# Patient Record
Sex: Male | Born: 1960
Health system: Southern US, Community
[De-identification: ages and names within clinical notes are randomized; demographics above are authoritative.]

## PROBLEM LIST (undated history)

## (undated) DIAGNOSIS — Z8739 Personal history of other diseases of the musculoskeletal system and connective tissue: Secondary | ICD-10-CM

## (undated) DIAGNOSIS — M542 Cervicalgia: Secondary | ICD-10-CM

## (undated) DIAGNOSIS — J302 Other seasonal allergic rhinitis: Secondary | ICD-10-CM

## (undated) DIAGNOSIS — F32A Depression, unspecified: Secondary | ICD-10-CM

## (undated) DIAGNOSIS — G4733 Obstructive sleep apnea (adult) (pediatric): Secondary | ICD-10-CM

## (undated) DIAGNOSIS — Z9989 Dependence on other enabling machines and devices: Secondary | ICD-10-CM

## (undated) DIAGNOSIS — K219 Gastro-esophageal reflux disease without esophagitis: Secondary | ICD-10-CM

## (undated) DIAGNOSIS — Z531 Procedure and treatment not carried out because of patient's decision for reasons of belief and group pressure: Secondary | ICD-10-CM

## (undated) DIAGNOSIS — M5412 Radiculopathy, cervical region: Secondary | ICD-10-CM

## (undated) DIAGNOSIS — F329 Major depressive disorder, single episode, unspecified: Secondary | ICD-10-CM

## (undated) DIAGNOSIS — I10 Essential (primary) hypertension: Secondary | ICD-10-CM

## (undated) DIAGNOSIS — M1990 Unspecified osteoarthritis, unspecified site: Secondary | ICD-10-CM

## (undated) DIAGNOSIS — G43909 Migraine, unspecified, not intractable, without status migrainosus: Secondary | ICD-10-CM

## (undated) DIAGNOSIS — E1169 Type 2 diabetes mellitus with other specified complication: Secondary | ICD-10-CM

## (undated) DIAGNOSIS — F419 Anxiety disorder, unspecified: Secondary | ICD-10-CM

## (undated) DIAGNOSIS — Z8719 Personal history of other diseases of the digestive system: Secondary | ICD-10-CM

## (undated) DIAGNOSIS — T753XXA Motion sickness, initial encounter: Secondary | ICD-10-CM

## (undated) DIAGNOSIS — C801 Malignant (primary) neoplasm, unspecified: Secondary | ICD-10-CM

## (undated) DIAGNOSIS — B019 Varicella without complication: Secondary | ICD-10-CM

## (undated) DIAGNOSIS — K469 Unspecified abdominal hernia without obstruction or gangrene: Secondary | ICD-10-CM

## (undated) DIAGNOSIS — J189 Pneumonia, unspecified organism: Secondary | ICD-10-CM

## (undated) DIAGNOSIS — R011 Cardiac murmur, unspecified: Secondary | ICD-10-CM

## (undated) DIAGNOSIS — IMO0001 Reserved for inherently not codable concepts without codable children: Secondary | ICD-10-CM

## (undated) DIAGNOSIS — E7800 Pure hypercholesterolemia, unspecified: Secondary | ICD-10-CM

## (undated) DIAGNOSIS — E119 Type 2 diabetes mellitus without complications: Secondary | ICD-10-CM

## (undated) DIAGNOSIS — E785 Hyperlipidemia, unspecified: Secondary | ICD-10-CM

## (undated) HISTORY — DX: Cardiac murmur, unspecified: R01.1

## (undated) HISTORY — DX: Gastro-esophageal reflux disease without esophagitis: K21.9

## (undated) HISTORY — DX: Varicella without complication: B01.9

## (undated) HISTORY — PX: VASECTOMY: SHX75

---
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
## 1982-10-18 HISTORY — PX: WRIST GANGLION EXCISION: SUR520

## 2013-10-25 ENCOUNTER — Encounter: Payer: Self-pay | Admitting: Internal Medicine

## 2013-10-25 ENCOUNTER — Ambulatory Visit (INDEPENDENT_AMBULATORY_CARE_PROVIDER_SITE_OTHER): Payer: 59 | Admitting: Internal Medicine

## 2013-10-25 VITALS — BP 132/84 | HR 77 | Temp 98.5°F | Ht 70.5 in | Wt 245.5 lb

## 2013-10-25 DIAGNOSIS — D1739 Benign lipomatous neoplasm of skin and subcutaneous tissue of other sites: Secondary | ICD-10-CM

## 2013-10-25 DIAGNOSIS — J309 Allergic rhinitis, unspecified: Secondary | ICD-10-CM

## 2013-10-25 DIAGNOSIS — Z125 Encounter for screening for malignant neoplasm of prostate: Secondary | ICD-10-CM

## 2013-10-25 DIAGNOSIS — Z0000 Encounter for general adult medical examination without abnormal findings: Secondary | ICD-10-CM

## 2013-10-25 DIAGNOSIS — D1723 Benign lipomatous neoplasm of skin and subcutaneous tissue of right leg: Secondary | ICD-10-CM

## 2013-10-25 DIAGNOSIS — M940 Chondrocostal junction syndrome [Tietze]: Secondary | ICD-10-CM

## 2013-10-25 NOTE — Progress Notes (Signed)
HPI Pt presents to the clinic today to establish care. He has not had a PCP in over a year. 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 He has multiple concerns today.  1- He has had a runny nose and cough. This started 3 weeks ago. The drainage is clear. He has not had fevers. He has tried saline nasal spray. He has no history of allergies or asthma.  2- He has a sharp pain in his right upper chest. This started about 4 days ago. The pain comes and goes but seems worse with movement. He denies history of chest trauma, chest tightness or shortness of breath. He has been moving heavy furniture by himself. He has not taken anything for the pain.  3- He has a lump on his right ankle. He just noticed this today. It does not hurt. He has not put anything on it.  Flu: 07/2013 Tetanus: UTD PSA screening: 10 years ago (? History of BPH) Colonoscopy: never Eye Doctor: Yearly Dentist: yearly  Past Medical History  Diagnosis Date  . GERD (gastroesophageal reflux disease)   . Heart murmur   . Chicken pox     Current Outpatient Prescriptions  Medication Sig Dispense Refill  . Amino Acid POWD 2 packets by Does not apply route daily.      Marland Kitchen ibuprofen (ADVIL,MOTRIN) 600 MG tablet Take 600 mg by mouth every 6 (six) hours as needed.      . methylcellulose (FIBER THERAPY) oral powder Take 1 packet by mouth 2 (two) times daily.      . Misc Natural Products (OSTEO BI-FLEX TRIPLE STRENGTH PO) Take 2 tablets by mouth daily.      . Multiple Vitamin (MULTIVITAMIN) capsule Take 1 capsule by mouth daily.      . Omega-3 Fatty Acids (FISH OIL) 1000 MG CAPS Take 1,000 mg by mouth 2 (two) times daily.       No current facility-administered medications for this visit.    No Known Allergies  Family History  Problem Relation Age of Onset  . Diabetes Mother   . Hyperlipidemia Mother   . Cancer Father   . Cancer Sister   . Diabetes Maternal Aunt   . Arthritis Maternal Grandmother   . Diabetes  Maternal Grandmother   . Arthritis Maternal Grandfather   . Arthritis Paternal Grandmother     History   Social History  . Marital Status: Married    Spouse Name: N/A    Number of Children: N/A  . Years of Education: N/A   Occupational History  . Not on file.   Social History Main Topics  . Smoking status: Former Research scientist (life sciences)  . Smokeless tobacco: Former Systems developer    Types: Chew  . Alcohol Use: Yes     Comment: moderate  . Drug Use: No  . Sexual Activity: Yes   Other Topics Concern  . Not on file   Social History Narrative  . No narrative on file    ROS:  Constitutional: Denies fever, malaise, fatigue, headache or abrupt weight changes.  HEENT: Denies eye pain, eye redness, ear pain, ringing in the ears, wax buildup,  nasal congestion, bloody nose, or sore throat. Respiratory: Denies difficulty breathing, shortness of breath, cough or sputum production.   Cardiovascular: Denies chest tightness, palpitations or swelling in the hands or feet.  Gastrointestinal: Denies abdominal pain, bloating, constipation, diarrhea or blood in the stool.  GU: Denies frequency, urgency, pain with urination, blood in urine, odor or discharge. Musculoskeletal:  Denies decrease in range of motion, difficulty with gait, muscle pain or joint pain and swelling.  Skin: Denies redness, rashes, lesions or ulcercations.  Neurological: Denies dizziness, difficulty with memory, difficulty with speech or problems with balance and coordination.   No other specific complaints in a complete review of systems (except as listed in HPI above).  PE:  BP 132/84  Pulse 77  Temp(Src) 98.5 F (36.9 C) (Oral)  Ht 5' 10.5" (1.791 m)  Wt 245 lb 8 oz (111.358 kg)  BMI 34.72 kg/m2  SpO2 98% Wt Readings from Last 3 Encounters:  10/25/13 245 lb 8 oz (111.358 kg)    General: Appears his stated age, obese but well developed, well nourished in NAD. HEENT: Head: normal shape and size; Eyes: sclera white, no icterus,  conjunctiva pink, PERRLA and EOMs intact; Ears: Tm's gray and intact, normal light reflex; Nose: mucosa pink and moist, septum midline; Throat/Mouth: Teeth present, mucosa pink and moist,+ PND, no lesions or ulcerations noted.  Neck: Normal range of motion. Neck supple, trachea midline. No massses, lumps or thyromegaly present.  Cardiovascular: Normal rate and rhythm. S1,S2 noted.  No murmur, rubs or gallops noted. No JVD or BLE edema. No carotid bruits noted. Pulmonary/Chest: Normal effort and positive vesicular breath sounds. No respiratory distress. No wheezes, rales or ronchi noted. Left costochondral junction between 2nd and 3rd rib with pinpoint tenderness.  Abdomen: Soft and nontender. Normal bowel sounds, no bruits noted. No distention or masses noted. Liver, spleen and kidneys non palpable. Musculoskeletal: Normal range of motion. No signs of joint swelling. No difficulty with gait. Quarter size lipoma noted on the right ankle just above the lateral malleolus.  Neurological: Alert and oriented. Cranial nerves II-XII intact. Coordination normal. +DTRs bilaterally. Psychiatric: Mood and affect normal. Behavior is normal. Judgment and thought content normal.      Assessment and Plan:  Preventative Health Maintenance:  Advised pt to work on diet and exercise Will order colon screening Will obtain screening labs today including PSA  Costochondritis, likely due to heavy lifting:  Advised pt to avoid heavy lifting Take advil OTC  Allergic Rhinitis:  Advised to take an OTC antihistamine like zyrtec  Lipoma, right ankle:  No intervention necessary Will continue to monitor  RTC in 1 year or sooner if needed

## 2013-10-25 NOTE — Patient Instructions (Signed)
Health Maintenance, Males A healthy lifestyle and preventative care can promote health and wellness.  Maintain regular health, dental, and eye exams.  Eat a healthy diet. Foods like vegetables, fruits, whole grains, low-fat dairy products, and lean protein foods contain the nutrients you need without too many calories. Decrease your intake of foods high in solid fats, added sugars, and salt. Get information about a proper diet from your caregiver, if necessary.  Regular physical exercise is one of the most important things you can do for your health. Most adults should get at least 150 minutes of moderate-intensity exercise (any activity that increases your heart rate and causes you to sweat) each week. In addition, most adults need muscle-strengthening exercises on 2 or more days a week.   Maintain a healthy weight. The body mass index (BMI) is a screening tool to identify possible weight problems. It provides an estimate of body fat based on height and weight. Your caregiver can help determine your BMI, and can help you achieve or maintain a healthy weight. For adults 20 years and older:  A BMI below 18.5 is considered underweight.  A BMI of 18.5 to 24.9 is normal.  A BMI of 25 to 29.9 is considered overweight.  A BMI of 30 and above is considered obese.  Maintain normal blood lipids and cholesterol by exercising and minimizing your intake of saturated fat. Eat a balanced diet with plenty of fruits and vegetables. Blood tests for lipids and cholesterol should begin at age 84 and be repeated every 5 years. If your lipid or cholesterol levels are high, you are over 50, or you are a high risk for heart disease, you may need your cholesterol levels checked more frequently.Ongoing high lipid and cholesterol levels should be treated with medicines, if diet and exercise are not effective.  If you smoke, find out from your caregiver how to quit. If you do not use tobacco, do not start.  Lung  cancer screening is recommended for adults aged 62 80 years who are at high risk for developing lung cancer because of a history of smoking. Yearly low-dose computed tomography (CT) is recommended for people who have at least a 30-pack-year history of smoking and are a current smoker or have quit within the past 15 years. A pack year of smoking is smoking an average of 1 pack of cigarettes a day for 1 year (for example: 1 pack a day for 30 years or 2 packs a day for 15 years). Yearly screening should continue until the smoker has stopped smoking for at least 15 years. Yearly screening should also be stopped for people who develop a health problem that would prevent them from having lung cancer treatment.  If you choose to drink alcohol, do not exceed 2 drinks per day. One drink is considered to be 12 ounces (355 mL) of beer, 5 ounces (148 mL) of wine, or 1.5 ounces (44 mL) of liquor.  Avoid use of street drugs. Do not share needles with anyone. Ask for help if you need support or instructions about stopping the use of drugs.  High blood pressure causes heart disease and increases the risk of stroke. Blood pressure should be checked at least every 1 to 2 years. Ongoing high blood pressure should be treated with medicines if weight loss and exercise are not effective.  If you are 32 to 51 years old, ask your caregiver if you should take aspirin to prevent heart disease.  Diabetes screening involves taking a blood  sample to check your fasting blood sugar level. This should be done once every 3 years, after age 80, if you are within normal weight and without risk factors for diabetes. Testing should be considered at a younger age or be carried out more frequently if you are overweight and have at least 1 risk factor for diabetes.  Colorectal cancer can be detected and often prevented. Most routine colorectal cancer screening begins at the age of 72 and continues through age 62. However, your caregiver may  recommend screening at an earlier age if you have risk factors for colon cancer. On a yearly basis, your caregiver may provide home test kits to check for hidden blood in the stool. Use of a small camera at the end of a tube, to directly examine the colon (sigmoidoscopy or colonoscopy), can detect the earliest forms of colorectal cancer. Talk to your caregiver about this at age 14, when routine screening begins. Direct examination of the colon should be repeated every 5 to 10 years through age 100, unless early forms of pre-cancerous polyps or small growths are found.  Hepatitis C blood testing is recommended for all people born from [birth year] through 1963 and any individual with known risks for hepatitis C.  Healthy men should no longer receive prostate-specific antigen (PSA) blood tests as part of routine cancer screening. Consult with your caregiver about prostate cancer screening.  Testicular cancer screening is not recommended for adolescents or adult males who have no symptoms. Screening includes self-exam, caregiver exam, and other screening tests. Consult with your caregiver about any symptoms you have or any concerns you have about testicular cancer.  Practice safe sex. Use condoms and avoid high-risk sexual practices to reduce the spread of sexually transmitted infections (STIs).  Use sunscreen. Apply sunscreen liberally and repeatedly throughout the day. You should seek shade when your shadow is shorter than you. Protect yourself by wearing long sleeves, pants, a wide-brimmed hat, and sunglasses year round, whenever you are outdoors.  Notify your caregiver of new moles or changes in moles, especially if there is a change in shape or color. Also notify your caregiver if a mole is larger than the size of a pencil eraser.  A one-time screening for abdominal aortic aneurysm (AAA) and surgical repair of large AAAs by sound wave imaging (ultrasonography) is recommended for ages 8 to 68 years who are  current or former smokers.  Stay current with your immunizations. Document Released: 04/01/2008 Document Revised: 01/29/2013 Document Reviewed: 03/01/2011 [facility] Patient Information 2014 [place], [place].

## 2013-10-26 LAB — COMPREHENSIVE METABOLIC PANEL
ALT: 23 U/L (ref 0–53)
AST: 31 U/L (ref 0–37)
Albumin: 4.4 g/dL (ref 3.5–5.2)
Alkaline Phosphatase: 57 U/L (ref 39–117)
BILIRUBIN TOTAL: 0.7 mg/dL (ref 0.3–1.2)
BUN: 19 mg/dL (ref 6–23)
CHLORIDE: 105 meq/L (ref 96–112)
CO2: 28 mEq/L (ref 19–32)
Calcium: 9.5 mg/dL (ref 8.4–10.5)
Creatinine, Ser: 1.3 mg/dL (ref 0.4–1.5)
GFR: 77.08 mL/min (ref >60.00)
Glucose, Bld: 84 mg/dL (ref 70–99)
Potassium: 4.4 mEq/L (ref 3.5–5.1)
SODIUM: 139 meq/L (ref 135–145)
Total Protein: 7.5 g/dL (ref 6.0–8.3)

## 2013-10-26 LAB — HEMOGLOBIN A1C: HEMOGLOBIN A1C: 7.1 % — AB (ref 4.6–6.5)

## 2013-10-26 LAB — LIPID PANEL
Cholesterol: 204 mg/dL — ABNORMAL HIGH (ref 0–200)
HDL: 34.2 mg/dL — ABNORMAL LOW (ref >39.00)
Total CHOL/HDL Ratio: 6
Triglycerides: 125 mg/dL (ref 0.0–149.0)
VLDL: 25 mg/dL (ref 0.0–40.0)

## 2013-10-26 LAB — CBC
HEMATOCRIT: 45.9 % (ref 39.0–52.0)
Hemoglobin: 15.1 g/dL (ref 13.0–17.0)
MCHC: 33 g/dL (ref 30.0–36.0)
MCV: 78.3 fl (ref 78.0–100.0)
Platelets: 238 10*3/uL (ref 150.0–400.0)
RBC: 5.87 Mil/uL — ABNORMAL HIGH (ref 4.22–5.81)
RDW: 15.4 % — ABNORMAL HIGH (ref 11.5–14.6)
WBC: 6.4 10*3/uL (ref 4.5–10.5)

## 2013-10-26 LAB — LDL CHOLESTEROL, DIRECT: LDL DIRECT: 143.5 mg/dL

## 2013-10-26 LAB — TSH: TSH: 1.59 u[IU]/mL (ref 0.35–5.50)

## 2013-10-26 LAB — PSA: PSA: 0.52 ng/mL (ref 0.10–4.00)

## 2013-10-30 ENCOUNTER — Ambulatory Visit (INDEPENDENT_AMBULATORY_CARE_PROVIDER_SITE_OTHER): Payer: 59 | Admitting: Internal Medicine

## 2013-10-30 ENCOUNTER — Encounter: Payer: Self-pay | Admitting: Internal Medicine

## 2013-10-30 ENCOUNTER — Other Ambulatory Visit: Payer: Self-pay

## 2013-10-30 VITALS — BP 132/84 | HR 76 | Temp 98.4°F | Wt 246.5 lb

## 2013-10-30 DIAGNOSIS — E119 Type 2 diabetes mellitus without complications: Secondary | ICD-10-CM

## 2013-10-30 DIAGNOSIS — R071 Chest pain on breathing: Secondary | ICD-10-CM

## 2013-10-30 DIAGNOSIS — R0789 Other chest pain: Secondary | ICD-10-CM

## 2013-10-30 DIAGNOSIS — M940 Chondrocostal junction syndrome [Tietze]: Secondary | ICD-10-CM

## 2013-10-30 MED ORDER — METFORMIN HCL 500 MG PO TABS: 500.0000 mg | ORAL_TABLET | Freq: Two times a day (BID) | ORAL | Status: DC

## 2013-10-30 MED ORDER — TRAMADOL HCL 50 MG PO TABS: 50.0000 mg | ORAL_TABLET | Freq: Two times a day (BID) | ORAL | Status: DC | PRN

## 2013-10-30 NOTE — Progress Notes (Signed)
Pre-visit discussion using our clinic review tool. No additional management support is needed unless otherwise documented below in the visit note.  

## 2013-10-30 NOTE — Progress Notes (Signed)
Subjective:    Patient ID: Marc Aguilar, male    DOB: 02/07/1961, 53 y.o.   MRN: [id number]  HPI  Pt presents to the clinic today to f/u labs. His labs revealed that his A1C was 7.1%. He has no previous history of diabetes. He does have a family history of diabetes. He has been testing his sugars, which have ranged from 97-190.  Additionally, he c/o worsening MSK pain. He injured his right chest wall while attempting to move furniture by himself. He does report the pain is now a 7/8. The Advil is not very effective.   Review of Systems  Past Medical History  Diagnosis Date  . GERD (gastroesophageal reflux disease)   . Heart murmur   . Chicken pox     Current Outpatient Prescriptions  Medication Sig Dispense Refill  . Amino Acid POWD 2 packets by Does not apply route daily.      Marland Kitchen ibuprofen (ADVIL,MOTRIN) 600 MG tablet Take 600 mg by mouth every 6 (six) hours as needed.      . methylcellulose (FIBER THERAPY) oral powder Take 1 packet by mouth 2 (two) times daily.      . Misc Natural Products (OSTEO BI-FLEX TRIPLE STRENGTH PO) Take 2 tablets by mouth daily.      . Multiple Vitamin (MULTIVITAMIN) capsule Take 1 capsule by mouth daily.      . Omega-3 Fatty Acids (FISH OIL) 1000 MG CAPS Take 1,000 mg by mouth 2 (two) times daily.       No current facility-administered medications for this visit.    No Known Allergies  Family History  Problem Relation Age of Onset  . Diabetes Mother   . Hyperlipidemia Mother   . Cancer Father   . Cancer Sister   . Diabetes Maternal Aunt   . Arthritis Maternal Grandmother   . Diabetes Maternal Grandmother   . Arthritis Maternal Grandfather   . Arthritis Paternal Grandmother     History   Social History  . Marital Status: Married    Spouse Name: N/A    Number of Children: N/A  . Years of Education: N/A   Occupational History  . Not on file.   Social History Main Topics  . Smoking status: Former Research scientist (life sciences)  . Smokeless tobacco: Former  Systems developer    Types: Chew  . Alcohol Use: Yes     Comment: moderate  . Drug Use: No  . Sexual Activity: Yes   Other Topics Concern  . Not on file   Social History Narrative  . No narrative on file     Constitutional: Denies fever, malaise, fatigue, headache or abrupt weight changes.  Respiratory: Denies difficulty breathing, shortness of breath, cough or sputum production.   Cardiovascular: Denies chest pain, chest tightness, palpitations or swelling in the hands or feet.  Gastrointestinal: Denies abdominal pain, bloating, constipation, diarrhea or blood in the stool.  GU: Denies urgency, frequency, pain with urination, burning sensation, blood in urine, odor or discharge. Musculoskeletal: Pt reports chest wall pain. Denies decrease in range of motion, difficulty with gait, or joint pain and swelling.  Skin: Denies redness, rashes, lesions or ulcercations.  Neurological: Denies numbness or tingling in the hands or feet, dizziness, difficulty with memory, difficulty with speech or problems with balance and coordination.   No other specific complaints in a complete review of systems (except as listed in HPI above).     Objective:   Physical Exam  BP 132/84  Pulse 76  Temp(Src) 98.4  F (36.9 C) (Oral)  Wt 246 lb 8 oz (111.812 kg)  SpO2 98% Wt Readings from Last 3 Encounters:  10/30/13 246 lb 8 oz (111.812 kg)  10/25/13 245 lb 8 oz (111.358 kg)    General: Appears his stated age, obese but  well developed, well nourished in NAD. Skin: Warm, dry and intact. No rashes, lesions or ulcerations noted. Cardiovascular: Normal rate and rhythm. S1,S2 noted.  No murmur, rubs or gallops noted. No JVD or BLE edema. No carotid bruits noted. Pulmonary/Chest: Normal effort and positive vesicular breath sounds. No respiratory distress. No wheezes, rales or ronchi noted.  Musculoskeletal: Right costochondral junction tenderness with palpation. Normal range of motion. No signs of joint swelling. No  difficulty with gait.  Neurological: Alert and oriented. Cranial nerves II-XII intact. Coordination normal. +DTRs bilaterally.     BMET    Component Value Date/Time   NA 139 10/25/2013 1602   K 4.4 10/25/2013 1602   CL 105 10/25/2013 1602   CO2 28 10/25/2013 1602   GLUCOSE 84 10/25/2013 1602   BUN 19 10/25/2013 1602   CREATININE 1.3 10/25/2013 1602   CALCIUM 9.5 10/25/2013 1602    Lipid Panel     Component Value Date/Time   CHOL 204* 10/25/2013 1602   TRIG 125.0 10/25/2013 1602   HDL 34.20* 10/25/2013 1602   CHOLHDL 6 10/25/2013 1602   VLDL 25.0 10/25/2013 1602    CBC    Component Value Date/Time   WBC 6.4 10/25/2013 1602   RBC 5.87* 10/25/2013 1602   HGB 15.1 10/25/2013 1602   HCT 45.9 10/25/2013 1602   PLT 238.0 10/25/2013 1602   MCV 78.3 10/25/2013 1602   MCHC 33.0 10/25/2013 1602   RDW 15.4* 10/25/2013 1602    Hgb A1C Lab Results  Component Value Date   HGBA1C 7.1* 10/25/2013         Assessment & Plan:   Chest wall pain secondary to costochondritis:  I want you to continue taking the Ibuprofen for its antiinflammatory properties eRx for Tramadol as needed Gave pt some stretching exercises to help stretch the area out

## 2013-10-30 NOTE — Patient Instructions (Signed)
Type 2 Diabetes Mellitus, Adult Type 2 diabetes mellitus, often simply referred to as type 2 diabetes, is a long-lasting (chronic) disease. In type 2 diabetes, the pancreas does not make enough insulin (a hormone), the cells are less responsive to the insulin that is made (insulin resistance), or both. Normally, insulin moves sugars from food into the tissue cells. The tissue cells use the sugars for energy. The lack of insulin or the lack of normal response to insulin causes excess sugars to build up in the blood instead of going into the tissue cells. As a result, high blood sugar (hyperglycemia) develops. The effect of high sugar (glucose) levels can cause many complications. Type 2 diabetes was also previously called adult-onset diabetes but it can occur at any age.  RISK FACTORS  A person is predisposed to developing type 2 diabetes if someone in the family has the disease and also has one or more of the following primary risk factors:  Overweight.  An inactive lifestyle.  A history of consistently eating high-calorie foods. Maintaining a normal weight and regular physical activity can reduce the chance of developing type 2 diabetes. SYMPTOMS  A person with type 2 diabetes may not show symptoms initially. The symptoms of type 2 diabetes appear slowly. The symptoms include:  Increased thirst (polydipsia).  Increased urination (polyuria).  Increased urination during the night (nocturia).  Weight loss. This weight loss may be rapid.  Frequent, recurring infections.  Tiredness (fatigue).  Weakness.  Vision changes, such as blurred vision.  Fruity smell to your breath.  Abdominal pain.  Nausea or vomiting.  Cuts or bruises which are slow to heal.  Tingling or numbness in the hands or feet. DIAGNOSIS Type 2 diabetes is frequently not diagnosed until complications of diabetes are present. Type 2 diabetes is diagnosed when symptoms or complications are present and when blood  glucose levels are increased. Your blood glucose level may be checked by one or more of the following blood tests:  A fasting blood glucose test. You will not be allowed to eat for at least 8 hours before a blood sample is taken.  A random blood glucose test. Your blood glucose is checked at any time of the day regardless of when you ate.  A hemoglobin A1c blood glucose test. A hemoglobin A1c test provides information about blood glucose control over the previous 3 months.  An oral glucose tolerance test (OGTT). Your blood glucose is measured after you have not eaten (fasted) for 2 hours and then after you drink a glucose-containing beverage. TREATMENT   You may need to take insulin or diabetes medicine daily to keep blood glucose levels in the desired range.  You will need to match insulin dosing with exercise and healthy food choices. The treatment goal is to maintain the before meal blood sugar (preprandial glucose) level at 70 130 mg/dL. HOME CARE INSTRUCTIONS   Have your hemoglobin A1c level checked twice a year.  Perform daily blood glucose monitoring as directed by your caregiver.  Monitor urine ketones when you are ill and as directed by your caregiver.  Take your diabetes medicine or insulin as directed by your caregiver to maintain your blood glucose levels in the desired range.  Never run out of diabetes medicine or insulin. It is needed every day.  Adjust insulin based on your intake of carbohydrates. Carbohydrates can raise blood glucose levels but need to be included in your diet. Carbohydrates provide vitamins, minerals, and fiber which are an essential part of   a healthy diet. Carbohydrates are found in fruits, vegetables, whole grains, dairy products, legumes, and foods containing added sugars.    Eat healthy foods. Alternate 3 meals with 3 snacks.  Lose weight if overweight.  Carry a medical alert card or wear your medical alert jewelry.  Carry a 15 gram  carbohydrate snack with you at all times to treat low blood glucose (hypoglycemia). Some examples of 15 gram carbohydrate snacks include:  Glucose tablets, 3 or 4   Glucose gel, 15 gram tube  Raisins, 2 tablespoons (24 grams)  Jelly beans, 6  Animal crackers, 8  Regular pop, 4 ounces (120 mL)  Gummy treats, 9  Recognize hypoglycemia. Hypoglycemia occurs with blood glucose levels of 70 mg/dL and below. The risk for hypoglycemia increases when fasting or skipping meals, during or after intense exercise, and during sleep. Hypoglycemia symptoms can include:  Tremors or shakes.  Decreased ability to concentrate.  Sweating.  Increased heart rate.  Headache.  Dry mouth.  Hunger.  Irritability.  Anxiety.  Restless sleep.  Altered speech or coordination.  Confusion.  Treat hypoglycemia promptly. If you are alert and able to safely swallow, follow the 15:15 rule:  Take 15 20 grams of rapid-acting glucose or carbohydrate. Rapid-acting options include glucose gel, glucose tablets, or 4 ounces (120 mL) of fruit juice, regular soda, or low fat milk.  Check your blood glucose level 15 minutes after taking the glucose.  Take 15 20 grams more of glucose if the repeat blood glucose level is still 70 mg/dL or below.  Eat a meal or snack within 1 hour once blood glucose levels return to normal.    Be alert to polyuria and polydipsia which are early signs of hyperglycemia. An early awareness of hyperglycemia allows for prompt treatment. Treat hyperglycemia as directed by your caregiver.  Engage in at least 150 minutes of moderate-intensity physical activity a week, spread over at least 3 days of the week or as directed by your caregiver. In addition, you should engage in resistance exercise at least 2 times a week or as directed by your caregiver.  Adjust your medicine and food intake as needed if you start a new exercise or sport.  Follow your sick day plan at any time you  are unable to eat or drink as usual.  Avoid tobacco use.  Limit alcohol intake to no more than 1 drink per day for nonpregnant women and 2 drinks per day for men. You should drink alcohol only when you are also eating food. Talk with your caregiver whether alcohol is safe for you. Tell your caregiver if you drink alcohol several times a week.  Follow up with your caregiver regularly.  Schedule an eye exam soon after the diagnosis of type 2 diabetes and then annually.  Perform daily skin and foot care. Examine your skin and feet daily for cuts, bruises, redness, nail problems, bleeding, blisters, or sores. A foot exam by a caregiver should be done annually.  Brush your teeth and gums at least twice a day and floss at least once a day. Follow up with your dentist regularly.  Share your diabetes management plan with your workplace or school.  Stay up-to-date with immunizations.  Learn to manage stress.  Obtain ongoing diabetes education and support as needed.  Participate in, or seek rehabilitation as needed to maintain or improve independence and quality of life. Request a physical or occupational therapy referral if you are having foot or hand numbness or difficulties with grooming,   dressing, eating, or physical activity. SEEK MEDICAL CARE IF:   You are unable to eat food or drink fluids for more than 6 hours.  You have nausea and vomiting for more than 6 hours.  Your blood glucose level is over 240 mg/dL.  There is a change in mental status.  You develop an additional serious illness.  You have diarrhea for more than 6 hours.  You have been sick or have had a fever for a couple of days and are not getting better.  You have pain during any physical activity.  SEEK IMMEDIATE MEDICAL CARE IF:  You have difficulty breathing.  You have moderate to large ketone levels. MAKE SURE YOU:  Understand these instructions.  Will watch your condition.  Will get help right away if  you are not doing well or get worse. Document Released: 10/04/2005 Document Revised: 06/28/2012 Document Reviewed: 05/02/2012 Exit[employer] Patient Information 2014 [employer].  

## 2013-10-30 NOTE — Assessment & Plan Note (Signed)
Counseled pt on diabetes process in the body, possible negative outcomes Advised pt the changes he could make with diet and exercise He will test his sugars BID We will start Metformin 500 mg BID  Foot exam today Referral placed for Diabetes Nutrition  He has already had his flu shot, will get his pneumovax at next visit  RTC in 3 months to recheck A1C

## 2013-10-31 ENCOUNTER — Telehealth: Payer: Self-pay

## 2013-10-31 NOTE — Telephone Encounter (Signed)
Relevant patient education assigned to patient using Emmi. ° °

## 2013-11-24 ENCOUNTER — Encounter: Payer: 59 | Attending: Internal Medicine

## 2013-11-24 VITALS — Ht 71.0 in | Wt 221.6 lb

## 2013-11-24 DIAGNOSIS — E119 Type 2 diabetes mellitus without complications: Secondary | ICD-10-CM | POA: Insufficient documentation

## 2013-11-24 NOTE — Progress Notes (Signed)
Patient was seen on 11/24/13 for the complete diabetes self-management series at the [facility].  Current A1c = 9.2% on 10/05/13  Handouts given during class include:  Living Well with Diabetes book  Carb Counting and Meal Planning book  Meal Plan Card  Carbohydrate guide  Meal planning worksheet  Low Sodium Flavoring Tips  The diabetes portion plate  Low Carbohydrate Snack Suggestions  A1c to eAG Conversion Chart  Diabetes Medications  Stress Management  Diabetes Recommended Care Schedule  Diabetes Success Plan  Core Class Satisfaction Survey  The following learning objectives were met by the patient during this course:  Describe diabetes  State some common risk factors for diabetes  Defines the role of glucose and insulin  Identifies type of diabetes and pathophysiology  Describe the relationship between diabetes and cardiovascular risk  State the members of the Healthcare Team  States the rationale for glucose monitoring  State when to test glucose  State their individual Target Range  State the importance of logging glucose readings  Describe how to interpret glucose readings  Identifies A1C target  Explain the correlation between A1c and eAG values  State symptoms and treatment of high blood glucose  State symptoms and treatment of low blood glucose  Explain proper technique for glucose testing  Identifies proper sharps disposal  Describe the role of different macronutrients on glucose  Explain how carbohydrates affect blood glucose  State what foods contain the most carbohydrates  Demonstrate carbohydrate counting  Demonstrate how to read Nutrition Facts food label  Describe effects of various fats on heart health  Describe the importance of good nutrition for health and healthy eating strategies  Describe techniques for managing your shopping, cooking and meal planning  List strategies to follow  meal plan when dining out  Describe the effects of alcohol on glucose and how to use it safely   State the amount of activity recommended for healthy living   Describe activities suitable for individual needs   Identify ways to regularly incorporate activity into daily life   Identify barriers to activity and ways to over come these barriers  Identify diabetes medications being personally used and their primary action for lowering glucose and possible side effects   Describe role of stress on blood glucose and develop strategies to address psychosocial issues   Identify diabetes complications and ways to prevent them  Explain how to manage diabetes during illness   Evaluate success in meeting personal goal   Establish 2-3 goals that they will plan to diligently work on until they return for the  4-month follow-up visit  Goals:  Follow Diabetes Meal Plan as instructed  Eat 3 meals and 2 snacks, every 3-5 hrs  Limit carbohydrate intake to 45 grams carbohydrate/meal Limit carbohydrate intake to 15 grams carbohydrate/snack Add lean protein foods to meals/snacks  Monitor glucose levels as instructed by your doctor  Aim for 15-30 mins of physical activity daily as tolerated  Bring food record and glucose log to your next nutrition visit  Your patient has established the following 4 month goals in their individualized success plan:  Count carbohydrates at most meals and snacks  Spread of physical activity throughout the week  Your patient has identified these potential barriers to change:  weather  Job stress  Your patient has identified their diabetes self-care support plan as  family 

## 2013-12-20 ENCOUNTER — Ambulatory Visit: Payer: 59 | Admitting: Internal Medicine

## 2013-12-20 DIAGNOSIS — Z0289 Encounter for other administrative examinations: Secondary | ICD-10-CM

## 2013-12-21 ENCOUNTER — Encounter: Payer: Self-pay | Admitting: Internal Medicine

## 2013-12-22 ENCOUNTER — Encounter (HOSPITAL_COMMUNITY): Payer: Self-pay | Admitting: Emergency Medicine

## 2013-12-22 ENCOUNTER — Emergency Department (HOSPITAL_COMMUNITY)
Admission: EM | Admit: 2013-12-22 | Discharge: 2013-12-22 | Disposition: A | Discharge status: Nursing Home (Includes ICF) | Payer: 59 | Source: Home / Self Care | Attending: Family Medicine | Admitting: Family Medicine

## 2013-12-22 ENCOUNTER — Emergency Department (HOSPITAL_COMMUNITY)
Admission: EM | Admit: 2013-12-22 | Discharge: 2013-12-22 | Disposition: A | Discharge status: Home / Self Care | Payer: 59 | Attending: Emergency Medicine | Admitting: Emergency Medicine

## 2013-12-22 DIAGNOSIS — E119 Type 2 diabetes mellitus without complications: Secondary | ICD-10-CM | POA: Insufficient documentation

## 2013-12-22 DIAGNOSIS — K219 Gastro-esophageal reflux disease without esophagitis: Secondary | ICD-10-CM | POA: Insufficient documentation

## 2013-12-22 DIAGNOSIS — I82409 Acute embolism and thrombosis of unspecified deep veins of unspecified lower extremity: Secondary | ICD-10-CM

## 2013-12-22 DIAGNOSIS — M79604 Pain in right leg: Secondary | ICD-10-CM

## 2013-12-22 DIAGNOSIS — B019 Varicella without complication: Secondary | ICD-10-CM | POA: Insufficient documentation

## 2013-12-22 DIAGNOSIS — Z87891 Personal history of nicotine dependence: Secondary | ICD-10-CM | POA: Insufficient documentation

## 2013-12-22 DIAGNOSIS — M79609 Pain in unspecified limb: Secondary | ICD-10-CM | POA: Insufficient documentation

## 2013-12-22 DIAGNOSIS — R609 Edema, unspecified: Secondary | ICD-10-CM | POA: Insufficient documentation

## 2013-12-22 MED ORDER — IBUPROFEN 400 MG PO TABS
600.0000 mg | ORAL_TABLET | Freq: Once | ORAL | Status: AC
  Administered 2013-12-22: 600 mg via ORAL
  Filled 2013-12-22 (×2): qty 1

## 2013-12-22 NOTE — Progress Notes (Signed)
VASCULAR LAB PRELIMINARY  PRELIMINARY  PRELIMINARY  PRELIMINARY  Right lower extremity venous Doppler completed.    Preliminary report:  There is no DVT or SVT noted in the right lower extremity.  There is an area noted form mid to proximal calf that could possibly be consistent with a muscle tear.  Lovena Kluck, RVT 12/22/2013, 3:16 PM

## 2013-12-22 NOTE — Discharge Instructions (Signed)
Musculoskeletal Pain °Musculoskeletal pain is muscle and boney aches and pains. These pains can occur in any part of the body. Your caregiver may treat you without knowing the cause of the pain. They may treat you if blood or urine tests, X-rays, and other tests were normal.  °CAUSES °There is often not a definite cause or reason for these pains. These pains may be caused by a type of germ (virus). The discomfort may also come from overuse. Overuse includes working out too hard when your body is not fit. Boney aches also come from weather changes. Bone is sensitive to atmospheric pressure changes. °HOME CARE INSTRUCTIONS  °· Ask when your test results will be ready. Make sure you get your test results. °· Only take over-the-counter or prescription medicines for pain, discomfort, or fever as directed by your caregiver. If you were given medications for your condition, do not drive, operate machinery or power tools, or sign legal documents for 24 hours. Do not drink alcohol. Do not take sleeping pills or other medications that may interfere with treatment. °· Continue all activities unless the activities cause more pain. When the pain lessens, slowly resume normal activities. Gradually increase the intensity and duration of the activities or exercise. °· During periods of severe pain, bed rest may be helpful. Lay or sit in any position that is comfortable. °· Putting ice on the injured area. °· Put ice in a bag. °· Place a towel between your skin and the bag. °· Leave the ice on for 15 to 20 minutes, 3 to 4 times a day. °· Follow up with your caregiver for continued problems and no reason can be found for the pain. If the pain becomes worse or does not go away, it may be necessary to repeat tests or do additional testing. Your caregiver may need to look further for a possible cause. °SEEK IMMEDIATE MEDICAL CARE IF: °· You have pain that is getting worse and is not relieved by medications. °· You develop chest pain  that is associated with shortness or breath, sweating, feeling sick to your stomach (nauseous), or throw up (vomit). °· Your pain becomes localized to the abdomen. °· You develop any new symptoms that seem different or that concern you. °MAKE SURE YOU:  °· Understand these instructions. °· Will watch your condition. °· Will get help right away if you are not doing well or get worse. °Document Released: 10/04/2005 Document Revised: 12/27/2011 Document Reviewed: 06/08/2013 °Exit[employer]® Patient Information ©2014 [employer]. ° °

## 2013-12-22 NOTE — ED Notes (Signed)
Pt presents to department for evaluation of R lower leg pain. Onset last night. Was seen at [facility] today and sent here for doppler study. States pain, warmth, and redness to R lower leg. 5/10 pain, increases with movement. Pt is alert and oriented x4.

## 2013-12-22 NOTE — ED Provider Notes (Signed)
CSN: [id number]     Arrival date & time 12/22/13  1213 History   First MD Initiated Contact with Patient 12/22/13 1359     Chief Complaint  Patient presents with  . Leg Pain   (Consider location/radiation/quality/duration/timing/severity/associated sxs/prior Treatment) Patient is a 53 y.o. male presenting with leg pain. The history is provided by the patient and the spouse.  Leg Pain Location:  Leg Time since incident:  2 days Injury: no   Leg location:  R lower leg Pain details:    Quality:  Cramping and throbbing   Radiates to:  Does not radiate   Severity:  Moderate   Onset quality:  Gradual   Progression:  Worsening Chronicity:  New Dislocation: no   Worsened by:  Nothing tried Associated symptoms: swelling   Associated symptoms comment:  No chest pain or sob. Risk factors comment:  No injury or recent travel, no smoking.   Past Medical History  Diagnosis Date  . GERD (gastroesophageal reflux disease)   . Heart murmur   . Chicken pox   . Diabetes mellitus without complication    Past Surgical History  Procedure Laterality Date  . Wrist surgery Left 1984    cyst removal   Family History  Problem Relation Age of Onset  . Diabetes Mother   . Hyperlipidemia Mother   . Cancer Father   . Cancer Sister   . Diabetes Maternal Aunt   . Arthritis Maternal Grandmother   . Diabetes Maternal Grandmother   . Arthritis Maternal Grandfather   . Arthritis Paternal Grandmother    History  Substance Use Topics  . Smoking status: Former Research scientist (life sciences)  . Smokeless tobacco: Former Systems developer    Types: Chew  . Alcohol Use: Yes     Comment: moderate    Review of Systems  Constitutional: Negative.   HENT: Negative.   Respiratory: Negative for chest tightness and shortness of breath.   Cardiovascular: Positive for leg swelling. Negative for chest pain and palpitations.    Allergies  Review of patient's allergies indicates no known allergies.  Home Medications   Current Outpatient  Rx  Name  Route  Sig  Dispense  Refill  . Amino Acid POWD   Does not apply   2 packets by Does not apply route daily.         Marland Kitchen amLODipine-benazepril (LOTREL) 10-20 MG per capsule   Oral   Take 1 capsule by mouth daily.         Marland Kitchen glipiZIDE (GLUCOTROL XL) 10 MG 24 hr tablet   Oral   Take 10 mg by mouth daily with breakfast.         . glucose blood (ONETOUCH VERIO) test strip   Other   1 each by Other route 2 (two) times daily. Use as instructed         . ibuprofen (ADVIL,MOTRIN) 600 MG tablet   Oral   Take 600 mg by mouth every 6 (six) hours as needed.         . metFORMIN (GLUCOPHAGE) 500 MG tablet   Oral   Take 1 tablet (500 mg total) by mouth 2 (two) times daily with a meal.   60 tablet   2   . methylcellulose (FIBER THERAPY) oral powder   Oral   Take 1 packet by mouth 2 (two) times daily.         . Misc Natural Products (OSTEO BI-FLEX TRIPLE STRENGTH PO)   Oral   Take 2 tablets by  mouth daily.         . Multiple Vitamin (MULTIVITAMIN) capsule   Oral   Take 1 capsule by mouth daily.         . Omega-3 Fatty Acids (FISH OIL) 1000 MG CAPS   Oral   Take 1,000 mg by mouth 2 (two) times daily.         . ramipril (ALTACE) 10 MG capsule   Oral   Take 10 mg by mouth daily.         . traMADol (ULTRAM) 50 MG tablet   Oral   Take 1 tablet (50 mg total) by mouth every 12 (twelve) hours as needed.   60 tablet   0    BP 126/85  Pulse 96  Temp(Src) 98.5 F (36.9 C) (Oral)  Resp 18  SpO2 100% Physical Exam  Nursing note and vitals reviewed. Constitutional: He is oriented to person, place, and time. He appears well-developed and well-nourished.  Cardiovascular: Regular rhythm, normal heart sounds and intact distal pulses.   Pulmonary/Chest: Effort normal and breath sounds normal.  Musculoskeletal: He exhibits edema and tenderness.       Right lower leg: He exhibits tenderness and swelling.       Legs: Neurological: He is alert and oriented to  person, place, and time.  Skin: Skin is warm and dry.    ED Course  Procedures (including critical care time) Labs Review Labs Reviewed - No data to display Imaging Review No results found.   MDM   1. DVT of lower extremity (deep venous thrombosis)     Sent for eval and treatment of likely dvt right calf.    Billy Fischer, MD 12/22/13 [phone]

## 2013-12-22 NOTE — ED Provider Notes (Signed)
CSN: [id number]     Arrival date & time 12/22/13  1446 History   First MD Initiated Contact with Patient 12/22/13 1501     Chief Complaint  Patient presents with  . Leg Pain     (Consider location/radiation/quality/duration/timing/severity/associated sxs/prior Treatment) Patient is a 53 y.o. male presenting with leg pain.  Leg Pain Location:  Leg Injury: no   Leg location:  R lower leg Pain details:    Quality:  Aching and throbbing   Radiates to:  Does not radiate   Severity:  Moderate   Onset quality:  Gradual   Duration:  1 day   Timing:  Constant   Progression:  Unchanged Chronicity:  New Relieved by:  Nothing Worsened by:  Bearing weight (Palpation) Ineffective treatments:  None tried Associated symptoms: swelling   Associated symptoms: no decreased ROM, no fever, no muscle weakness, no numbness and no tingling     Past Medical History  Diagnosis Date  . GERD (gastroesophageal reflux disease)   . Heart murmur   . Chicken pox   . Diabetes mellitus without complication    Past Surgical History  Procedure Laterality Date  . Wrist surgery Left 1984    cyst removal   Family History  Problem Relation Age of Onset  . Diabetes Mother   . Hyperlipidemia Mother   . Cancer Father   . Cancer Sister   . Diabetes Maternal Aunt   . Arthritis Maternal Grandmother   . Diabetes Maternal Grandmother   . Arthritis Maternal Grandfather   . Arthritis Paternal Grandmother    History  Substance Use Topics  . Smoking status: Former Research scientist (life sciences)  . Smokeless tobacco: Former Systems developer    Types: Chew  . Alcohol Use: Yes     Comment: moderate    Review of Systems  Constitutional: Negative for fever.  Respiratory: Negative for shortness of breath.   Cardiovascular: Negative for chest pain.  Gastrointestinal: Negative for nausea, vomiting and diarrhea.  All other systems reviewed and are negative.      Allergies  Review of patient's allergies indicates no known  allergies.  Home Medications   Current Outpatient Rx  Name  Route  Sig  Dispense  Refill  . ibuprofen (ADVIL,MOTRIN) 600 MG tablet   Oral   Take 600 mg by mouth every 6 (six) hours as needed for moderate pain.          . metFORMIN (GLUCOPHAGE) 500 MG tablet   Oral   Take 1 tablet (500 mg total) by mouth 2 (two) times daily with a meal.   60 tablet   2   . methylcellulose (FIBER THERAPY) oral powder   Oral   Take 1 packet by mouth 2 (two) times daily as needed (constipation).          . traMADol (ULTRAM) 50 MG tablet   Oral   Take 1 tablet (50 mg total) by mouth every 12 (twelve) hours as needed.   60 tablet   0    BP 126/81  Pulse 83  Temp(Src) 98.4 F (36.9 C)  Resp 18  SpO2 99% Physical Exam  Nursing note and vitals reviewed. Constitutional: He is oriented to person, place, and time. He appears well-developed and well-nourished. No distress.  HENT:  Head: Normocephalic and atraumatic.  Mouth/Throat: Oropharynx is clear and moist.  Eyes: Conjunctivae are normal. Pupils are equal, round, and reactive to light. No scleral icterus.  Neck: Neck supple.  Cardiovascular: Normal rate, regular rhythm, normal heart sounds  and intact distal pulses.   No murmur heard. Pulmonary/Chest: Effort normal and breath sounds normal. No stridor. No respiratory distress. He has no wheezes. He has no rales.  Abdominal: Soft. He exhibits no distension. There is no tenderness.  Musculoskeletal: Normal range of motion.       Right lower leg: He exhibits tenderness, swelling and edema. He exhibits no deformity.       Legs: Bilateral lower extremities: 2+ distal pulses, normal strength and sensation, normal motor.  Neurological: He is alert and oriented to person, place, and time.  Skin: Skin is warm and dry. No rash noted.  Psychiatric: He has a normal mood and affect. His behavior is normal.    ED Course  Procedures (including critical care time) Labs Review Labs Reviewed - No  data to display Imaging Review No results found.   EKG Interpretation None      MDM   Final diagnoses:  Right leg pain    53 year old male with pain and swelling of his right calf. Started last night. Doppler study performed here, and negative for DVT per verbal report from ultrasound tech. No redness, warmth, or fluctuance.  does not appear to be cellulitis at this time. Plan NSAIDs and PCP followup. Return precautions given    Houston Siren III, MD 12/22/13 1538

## 2013-12-22 NOTE — ED Notes (Signed)
Patient states was having some pain to right leg that started last night Today the right calf is swollen Denies any injury Taking ibuprofen

## 2013-12-23 ENCOUNTER — Encounter: Payer: Self-pay | Admitting: Internal Medicine

## 2013-12-24 ENCOUNTER — Other Ambulatory Visit: Payer: Self-pay | Admitting: Internal Medicine

## 2013-12-24 DIAGNOSIS — Z1211 Encounter for screening for malignant neoplasm of colon: Secondary | ICD-10-CM

## 2013-12-25 ENCOUNTER — Ambulatory Visit (INDEPENDENT_AMBULATORY_CARE_PROVIDER_SITE_OTHER): Payer: 59 | Admitting: Internal Medicine

## 2013-12-25 ENCOUNTER — Telehealth: Payer: Self-pay

## 2013-12-25 ENCOUNTER — Encounter: Payer: Self-pay | Admitting: Internal Medicine

## 2013-12-25 ENCOUNTER — Telehealth: Payer: Self-pay | Admitting: Internal Medicine

## 2013-12-25 VITALS — BP 126/78 | HR 83 | Temp 98.3°F | Wt 236.2 lb

## 2013-12-25 DIAGNOSIS — M7989 Other specified soft tissue disorders: Secondary | ICD-10-CM

## 2013-12-25 DIAGNOSIS — M79609 Pain in unspecified limb: Secondary | ICD-10-CM

## 2013-12-25 DIAGNOSIS — M79661 Pain in right lower leg: Secondary | ICD-10-CM

## 2013-12-25 LAB — D-DIMER, QUANTITATIVE: D-Dimer, Quant: 2.3 ug/mL-FEU — ABNORMAL HIGH (ref 0.00–0.48)

## 2013-12-25 MED ORDER — HYDROCHLOROTHIAZIDE 12.5 MG PO CAPS: ORAL_CAPSULE | ORAL | Status: DC

## 2013-12-25 MED ORDER — GLUCOSE BLOOD VI STRP: 1.0000 | ORAL_STRIP | Status: DC | PRN

## 2013-12-25 NOTE — Progress Notes (Signed)
HPI: Pt presents today for hospital follow up at [facility]. He present with right lower calf pain and swelling. His ultrasound was negative for DVT, however did show possible muscle tear. The ED provider recommended NSAIDs and rest and to follow up accordingly.   Summary of Ultrasound: No evidence of deep vein thrombosis involving the right lower extremity. There is an area from mid to proximal calf that could possibly be consistent with a muscle tear.  Other specific details can be found in the table(s) above. Prepared and Electronically Authenticated by  Curt Jews, MD   Today pt endorses increased swelling, pain, and tenderness. Pt denies chest pain, tingling, shortness of breath, and difficulty with gait. Pt has tried Ibuprofen with little relief.   Past Medical History  Diagnosis Date  . GERD (gastroesophageal reflux disease)   . Heart murmur   . Chicken pox   . Diabetes mellitus without complication     Current Outpatient Prescriptions  Medication Sig Dispense Refill  . ibuprofen (ADVIL,MOTRIN) 600 MG tablet Take 600 mg by mouth every 6 (six) hours as needed for moderate pain.       . metFORMIN (GLUCOPHAGE) 500 MG tablet Take 1 tablet (500 mg total) by mouth 2 (two) times daily with a meal.  60 tablet  2  . methylcellulose (FIBER THERAPY) oral powder Take 1 packet by mouth 2 (two) times daily as needed (constipation).       . traMADol (ULTRAM) 50 MG tablet Take 1 tablet (50 mg total) by mouth every 12 (twelve) hours as needed.  60 tablet  0   No current facility-administered medications for this visit.    No Known Allergies  Family History  Problem Relation Age of Onset  . Diabetes Mother   . Hyperlipidemia Mother   . Cancer Father   . Cancer Sister   . Diabetes Maternal Aunt   . Arthritis Maternal Grandmother   . Diabetes Maternal Grandmother   . Arthritis Maternal Grandfather   . Arthritis Paternal Grandmother     History   Social History  . Marital  Status: Married    Spouse Name: N/A    Number of Children: N/A  . Years of Education: N/A   Occupational History  . Not on file.   Social History Main Topics  . Smoking status: Former Research scientist (life sciences)  . Smokeless tobacco: Former Systems developer    Types: Chew  . Alcohol Use: Yes     Comment: moderate  . Drug Use: No  . Sexual Activity: Yes   Other Topics Concern  . Not on file   Social History Narrative  . No narrative on file    ROS:  Constitutional: Denies fever, malaise, fatigue, headache or abrupt weight changes.  Respiratory: Denies difficulty breathing, shortness of breath, cough or sputum production.   Cardiovascular: Denies chest pain, chest tightness, palpitations or swelling in the hands or feet. . Musculoskeletal:Endorses muscles pain and swelling.  Denies decrease in range of motion, difficulty with gait, or joint pain.  Skin: Denies redness, rashes, lesions or ulcercations.  Neurological: Denies dizziness, difficulty with memory, difficulty with speech or problems with balance and coordination.   No other specific complaints in a complete review of systems (except as listed in HPI above).  PE:  There were no vitals taken for this visit. Wt Readings from Last 3 Encounters:  11/24/13 221 lb 9.6 oz (100.517 kg)  10/30/13 246 lb 8 oz (111.812 kg)  10/25/13 245 lb 8 oz (111.358 kg)  General: Appears their stated age, well developed, well nourished in NAD.  Cardiovascular: Normal rate and rhythm. S1,S2 noted.  No murmur, rubs or gallops noted. No JVD. 1+ pitting edema to right knee to right ankle. No carotid bruits noted. Pulmonary/Chest: Normal effort and positive vesicular breath sounds. No respiratory distress. No wheezes, rales or ronchi noted.  Musculoskeletal: Normal range of motion. Right calf swollen, tender to palpation, no redness or warmth noted. Calf measurements 18 inches bilaterally. Negative Homans sign.  No difficulty with gait.       BMET    Component Value  Date/Time   NA 139 10/25/2013 1602   K 4.4 10/25/2013 1602   CL 105 10/25/2013 1602   CO2 28 10/25/2013 1602   GLUCOSE 84 10/25/2013 1602   BUN 19 10/25/2013 1602   CREATININE 1.3 10/25/2013 1602   CALCIUM 9.5 10/25/2013 1602    Lipid Panel     Component Value Date/Time   CHOL 204* 10/25/2013 1602   TRIG 125.0 10/25/2013 1602   HDL 34.20* 10/25/2013 1602   CHOLHDL 6 10/25/2013 1602   VLDL 25.0 10/25/2013 1602    CBC    Component Value Date/Time   WBC 6.4 10/25/2013 1602   RBC 5.87* 10/25/2013 1602   HGB 15.1 10/25/2013 1602   HCT 45.9 10/25/2013 1602   PLT 238.0 10/25/2013 1602   MCV 78.3 10/25/2013 1602   MCHC 33.0 10/25/2013 1602   RDW 15.4* 10/25/2013 1602    Hgb A1C Lab Results  Component Value Date   HGBA1C 7.1* 10/25/2013     Assessment and Plan: Right Leg Swelling/Pain: ? Muscle tear from Ultrasound Rx for HCTZ 12.5mg  for 3 days to reduce swelling Continue taking your prescribed Ultram for pain  Will check d-dimer since patient still concerned for possible DVT Follow up in 3-5 days if no symptom improvement  Yomaris Palecek S, Student-NP

## 2013-12-25 NOTE — Telephone Encounter (Signed)
Strips sent to pharmacy and pt is aware 

## 2013-12-25 NOTE — Telephone Encounter (Signed)
Pt says he is needing refill on Meter strips. Pt uses One Touch strips. Pt uses [facility] in [place]. Please advise.

## 2013-12-25 NOTE — Telephone Encounter (Signed)
[facility] pharmacy request how often pt checks BS; according to details under problem list for diabetes; pt checks BS twice a day.

## 2013-12-25 NOTE — Telephone Encounter (Signed)
Ebony Hail with [facility] called D dimer 2.3; given to Webb Silversmith NP.

## 2013-12-25 NOTE — Progress Notes (Signed)
Subjective:    Patient ID: Marc Aguilar, male    DOB: 04/05/1961, 53 y.o.   MRN: [id number]  HPI  Pt presents to the clinic today for ER followup. He went to the ER on 12/22/2013 for right leg pain and swelling x 1 days. An ultrasound was done and negative for DVT. The ultrasound was consistent with gastrocnemius muscle tear. He did take Ibuprofen as instructed without much relief. He reports that his leg is more swollen. He does not feel like the ultrasound covered enough area on his leg to rule out a blood clot.  Review of Systems      Past Medical History  Diagnosis Date  . GERD (gastroesophageal reflux disease)   . Heart murmur   . Chicken pox   . Diabetes mellitus without complication     Current Outpatient Prescriptions  Medication Sig Dispense Refill  . ibuprofen (ADVIL,MOTRIN) 600 MG tablet Take 600 mg by mouth every 6 (six) hours as needed for moderate pain.       . metFORMIN (GLUCOPHAGE) 500 MG tablet Take 1 tablet (500 mg total) by mouth 2 (two) times daily with a meal.  60 tablet  2  . methylcellulose (FIBER THERAPY) oral powder Take 1 packet by mouth 2 (two) times daily as needed (constipation).       . traMADol (ULTRAM) 50 MG tablet Take 1 tablet (50 mg total) by mouth every 12 (twelve) hours as needed.  60 tablet  0   No current facility-administered medications for this visit.    No Known Allergies  Family History  Problem Relation Age of Onset  . Diabetes Mother   . Hyperlipidemia Mother   . Cancer Father   . Cancer Sister   . Diabetes Maternal Aunt   . Arthritis Maternal Grandmother   . Diabetes Maternal Grandmother   . Arthritis Maternal Grandfather   . Arthritis Paternal Grandmother     History   Social History  . Marital Status: Married    Spouse Name: N/A    Number of Children: N/A  . Years of Education: N/A   Occupational History  . Not on file.   Social History Main Topics  . Smoking status: Former Research scientist (life sciences)  . Smokeless tobacco: Former  Systems developer    Types: Chew  . Alcohol Use: Yes     Comment: moderate  . Drug Use: No  . Sexual Activity: Yes   Other Topics Concern  . Not on file   Social History Narrative  . No narrative on file     Constitutional: Denies fever, malaise, fatigue, headache or abrupt weight changes.   Cardiovascular: Pt reports right leg swelling. Denies chest pain, chest tightness, palpitations or swelling in the hands.  Musculoskeletal: Pt reports right leg pain. Denies decrease in range of motion, difficulty with gait, or joint pain and swelling.  Skin: Denies redness, warmth, rashes, lesions or ulcercations.     No other specific complaints in a complete review of systems (except as listed in HPI above).  Objective:   Physical Exam   BP 126/78  Pulse 83  Temp(Src) 98.3 F (36.8 C) (Oral)  Wt 236 lb 4 oz (107.162 kg)  SpO2 98% Wt Readings from Last 3 Encounters:  12/25/13 236 lb 4 oz (107.162 kg)  11/24/13 221 lb 9.6 oz (100.517 kg)  10/30/13 246 lb 8 oz (111.812 kg)    General: Appears his stated age, well developed, well nourished in NAD. Skin: Warm, dry and intact. No rashes,  lesions or ulcerations noted. Cardiovascular: Normal rate and rhythm. S1,S2 noted.  No murmur, rubs or gallops noted. No JVD. BLE 18 cm mid calf bilaterally. Right leg with 1+ pretibial pitting edema. Negative Homan's sign. Pulmonary/Chest: Normal effort and positive vesicular breath sounds. No respiratory distress. No wheezes, rales or ronchi noted.  Musculoskeletal: Normal range of motion of the right knee and ankle. No signs of joint swelling. No difficulty with gait.    BMET    Component Value Date/Time   NA 139 10/25/2013 1602   K 4.4 10/25/2013 1602   CL 105 10/25/2013 1602   CO2 28 10/25/2013 1602   GLUCOSE 84 10/25/2013 1602   BUN 19 10/25/2013 1602   CREATININE 1.3 10/25/2013 1602   CALCIUM 9.5 10/25/2013 1602    Lipid Panel     Component Value Date/Time   CHOL 204* 10/25/2013 1602   TRIG 125.0 10/25/2013 1602    HDL 34.20* 10/25/2013 1602   CHOLHDL 6 10/25/2013 1602   VLDL 25.0 10/25/2013 1602    CBC    Component Value Date/Time   WBC 6.4 10/25/2013 1602   RBC 5.87* 10/25/2013 1602   HGB 15.1 10/25/2013 1602   HCT 45.9 10/25/2013 1602   PLT 238.0 10/25/2013 1602   MCV 78.3 10/25/2013 1602   MCHC 33.0 10/25/2013 1602   RDW 15.4* 10/25/2013 1602    Hgb A1C Lab Results  Component Value Date   HGBA1C 7.1* 10/25/2013        Assessment & Plan:   Right leg pain and swelling, unresolved:  Hospital notes reviewed including imaging- time to review approx 15 minutes Ultrasound neg for DVT and consistent with muscle tear Continue Ibuprofen and can take Tramadol  Will check D- dimer (although I reassured him that I do not think this is a DVT) Will try HCTZ x 3 days, if no improvement, RTC for follow up  Watch for increased swelling, redness, warmth or pain in the calf.  RTC as needed or if symptoms persist or worsen

## 2013-12-25 NOTE — Progress Notes (Signed)
Pre visit review using our clinic review tool, if applicable. No additional management support is needed unless otherwise documented below in the visit note. 

## 2013-12-25 NOTE — Patient Instructions (Addendum)
Deep Vein Thrombosis  A deep vein thrombosis (DVT) is a blood clot that develops in the deep, larger veins of the leg, arm, or pelvis. These are more dangerous than clots that might form in veins near the surface of the body. A DVT can lead to complications if the clot breaks off and travels in the bloodstream to the lungs.   A DVT can damage the valves in your leg veins, so that instead of flowing upward, the blood pools in the lower leg. This is called post-thrombotic syndrome, and it can result in pain, swelling, discoloration, and sores on the leg.  CAUSES  Usually, several things contribute to blood clots forming. Contributing factors include:   The flow of blood slows down.   The inside of the vein is damaged in some way.   You have a condition that makes blood clot more easily.  RISK FACTORS  Some people are more likely than others to develop blood clots. Risk factors include:    Older age, especially over 75 years of age.   Having a family history of blood clots or if you have already had a blot clot.   Having major or lengthy surgery. This is especially true for surgery on the hip, knee, or belly (abdomen). Hip surgery is particularly high risk.   Breaking a hip or leg.   Sitting or lying still for a long time. This includes long-distance travel, paralysis, or recovery from an illness or surgery.   Having cancer or cancer treatment.   Having a long, thin tube (catheter) placed inside a vein during a medical procedure.   Being overweight (obese).   Pregnancy and childbirth.   Hormone changes make the blood clot more easily during pregnancy.   The fetus puts pressure on the veins of the pelvis.   There is a risk of injury to veins during delivery or a caesarean. The risk is highest just after childbirth.   Medicines with the male hormone estrogen. This includes birth control pills and hormone replacement therapy.   Smoking.   Other circulation or heart problems.    SIGNS AND SYMPTOMS  When  a clot forms, it can either partially or totally block the blood flow in that vein. Symptoms of a DVT can include:   Swelling of the leg or arm, especially if one side is much worse.   Warmth and redness of the leg or arm, especially if one side is much worse.   Pain in an arm or leg. If the clot is in the leg, symptoms may be more noticeable or worse when standing or walking.  The symptoms of a DVT that has traveled to the lungs (pulmonary embolism, PE) usually start suddenly and include:   Shortness of breath.   Coughing.   Coughing up blood or blood-tinged phlegm.   Chest pain. The chest pain is often worse with deep breaths.   Rapid heartbeat.  Anyone with these symptoms should get emergency medical treatment right away. Call your local emergency services (911 in the [place] if you have these symptoms.  DIAGNOSIS  If a DVT is suspected, your health care provider will take a full medical history and perform a physical exam. Tests that also may be required include:   Blood tests, including studies of the clotting properties of the blood.   Ultrasonography to see if you have clots in your legs or lungs.   X-rays to show the flow of blood when dye is injected into the veins (  venography).   Studies of your lungs if you have any chest symptoms.  PREVENTION   Exercise the legs regularly. Take a brisk 30-minute walk every day.   Maintain a weight that is appropriate for your height.   Avoid sitting or lying in bed for long periods of time without moving your legs.   Women, particularly those over the age of 35 years, should consider the risks and benefits of taking estrogen medicines, including birth control pills.   Do not smoke, especially if you take estrogen medicines.   Long-distance travel can increase your risk of DVT. You should exercise your legs by walking or pumping the muscles every hour.   In-hospital prevention:   Many of the risk factors above relate to situations that exist with  hospitalization, either for illness, injury, or elective surgery.   Your health care provider will assess you for the need for venous thromboembolism prophylaxis when you are admitted to the hospital. If you are having surgery, your surgeon will assess you the day of or day after surgery.   Prevention may include medical and nonmedical measures.  TREATMENT  Once identified, a DVT can be treated. It can also be prevented in some circumstances. Once you have had a DVT, you may be at increased risk for a DVT in the future. The most common treatment for DVT is blood thinning (anticoagulant) medicine, which reduces the blood's tendency to clot. Anticoagulants can stop new blood clots from forming and stop old ones from growing. They cannot dissolve existing clots. Your body does this by itself over time. Anticoagulants can be given by mouth, by IV access, or by injection. Your health care provider will determine the best program for you. Other medicines or treatments that may be used are:   Heparin or related medicines (low molecular weight heparin) are usually the first treatment for a blood clot. They act quickly. However, they cannot be taken orally.   Heparin can cause a fall in a component of blood that stops bleeding and forms blood clots (platelets). You will be monitored with blood tests to be sure this does not occur.   Warfarin is an anticoagulant that can be swallowed. It takes a few days to start working, so usually heparin or related medicines are used in combination. Once warfarin is working, heparin is usually stopped.   Less commonly, clot dissolving drugs (thrombolytics) are used to dissolve a DVT. They carry a high risk of bleeding, so they are used mainly in severe cases, where your life or a limb is threatened.   Very rarely, a blood clot in the leg needs to be removed surgically.   If you are unable to take anticoagulants, your health care provider may arrange for you to have a filter placed  in a main vein in your abdomen. This filter prevents clots from traveling to your lungs.  HOME CARE INSTRUCTIONS   Take all medicines prescribed by your health care provider. Only take over-the-counter or prescription medicines for pain, fever, or discomfort as directed by your health care provider.   Warfarin. Most people will continue taking warfarin after hospital discharge. Your health care provider will advise you on the length of treatment (usually 3 6 months, sometimes lifelong).   Too much and too little warfarin are both dangerous. Too much warfarin increases the risk of bleeding. Too little warfarin continues to allow the risk for blood clots. While taking warfarin, you will need to have regular blood tests to measure your   blood clotting time. These blood tests usually include both the prothrombin time (PT) and international normalized ratio (INR) tests. The PT and INR results allow your health care provider to adjust your dose of warfarin. The dose can change for many reasons. It is critically important that you take warfarin exactly as prescribed, and that you have your PT and INR levels drawn exactly as directed.   Many foods, especially foods high in vitamin K, can interfere with warfarin and affect the PT and INR results. Foods high in vitamin K include spinach, kale, broccoli, cabbage, collard and turnip greens, brussel sprouts, peas, cauliflower, seaweed, and parsley as well as beef and pork liver, green tea, and soybean oil. You should eat a consistent amount of foods high in vitamin K. Avoid major changes in your diet, or notify your health care provider before changing your diet. Arrange a visit with a dietitian to answer your questions.   Many medicines can interfere with warfarin and affect the PT and INR results. You must tell your health care provider about any and all medicines you take. This includes all vitamins and supplements. Be especially cautious with aspirin and  anti-inflammatory medicines. Ask your health care provider before taking these. Do not take or discontinue any prescribed or over-the-counter medicine except on the advice of your health care provider or pharmacist.   Warfarin can have side effects, primarily excessive bruising or bleeding. You will need to hold pressure over cuts for longer than usual. Your health care provider or pharmacist will discuss other potential side effects.   Alcohol can change the body's ability to handle warfarin. It is best to avoid alcoholic drinks or consume only very small amounts while taking warfarin. Notify your health care provider if you change your alcohol intake.   Notify your dentist or other health care providers before procedures.   Activity. Ask your health care provider how soon you can go back to normal activities. It is important to stay active to prevent blood clots. If you are on anticoagulant medicine, avoid contact sports.   Exercise. It is very important to exercise. This is especially important while traveling, sitting, or standing for long periods of time. Exercise your legs by walking or by pumping the muscles frequently. Take frequent walks.   Compression stockings. These are tight elastic stockings that apply pressure to the lower legs. This pressure can help keep the blood in the legs from clotting. You may need to wear compression stockings at home to help prevent a DVT.   Do not smoke. If you smoke, quit. Ask your health care provider for help with quitting smoking.   Learn as much as you can about DVT. Knowing more about the condition should help you keep it from coming back.   Wear a medical alert bracelet or carry a medical alert card.  SEEK MEDICAL CARE IF:   You notice a rapid heartbeat.   You feel weaker or more tired than usual.   You feel faint.   You notice increased bruising.   You feel your symptoms are not getting better in the time expected.   You believe you are having side  effects of medicine.  SEEK IMMEDIATE MEDICAL CARE IF:   You have chest pain.   You have trouble breathing.   You have new or increased swelling or pain in one leg.   You cough up blood.   You notice blood in vomit, in a bowel movement, or in urine.  MAKE SURE   YOU:   Understand these instructions.   Will watch your condition.   Will get help right away if you are not doing well or get worse.  Document Released: 10/04/2005 Document Revised: 07/25/2013 Document Reviewed: 06/11/2013  [employer] Patient Information 2014 [employer]

## 2013-12-27 ENCOUNTER — Encounter: Payer: Self-pay | Admitting: Internal Medicine

## 2013-12-28 ENCOUNTER — Ambulatory Visit (INDEPENDENT_AMBULATORY_CARE_PROVIDER_SITE_OTHER): Payer: 59 | Admitting: Internal Medicine

## 2013-12-28 ENCOUNTER — Encounter: Payer: Self-pay | Admitting: Internal Medicine

## 2013-12-28 VITALS — BP 120/82 | HR 87 | Temp 98.7°F | Wt 232.0 lb

## 2013-12-28 DIAGNOSIS — I8390 Asymptomatic varicose veins of unspecified lower extremity: Secondary | ICD-10-CM

## 2013-12-28 DIAGNOSIS — M25879 Other specified joint disorders, unspecified ankle and foot: Secondary | ICD-10-CM

## 2013-12-28 DIAGNOSIS — I83893 Varicose veins of bilateral lower extremities with other complications: Secondary | ICD-10-CM

## 2013-12-28 NOTE — Progress Notes (Signed)
Subjective:    Patient ID: Marc Aguilar, male    DOB: [birth year], 53 y.o.   MRN: [id number]  HPI  Pt presents to the clinic today for followup. He went to the ER on 12/22/2013 for right leg pain and swelling x 1 days. An ultrasound was done and negative for DVT. The ultrasound was consistent with gastrocnemius muscle tear. He did take Ibuprofen as instructed without much relief. He reports that his leg is more swollen. He does not feel like the ultrasound covered enough area on his leg to rule out a blood clot. He was seen here 12/26/13, given HCTZ at that time for the increased swelling. The swelling has gone down but he continues to have pain in his calf and he can also feel lumps in his calf. He did have an elevated d dimer 2.30.  Review of Systems      Past Medical History  Diagnosis Date  . GERD (gastroesophageal reflux disease)   . Heart murmur   . Chicken pox   . Diabetes mellitus without complication     Current Outpatient Prescriptions  Medication Sig Dispense Refill  . glucose blood test strip Ck blood sugar twice a day and as directed. Dx 250.00      . hydrochlorothiazide (MICROZIDE) 12.5 MG capsule Take 1 tablet by mouth daily x 3 days (extra given in case we need to repeat)  10 capsule  0  . ibuprofen (ADVIL,MOTRIN) 600 MG tablet Take 600 mg by mouth every 6 (six) hours as needed for moderate pain.       . metFORMIN (GLUCOPHAGE) 500 MG tablet Take 1 tablet (500 mg total) by mouth 2 (two) times daily with a meal.  60 tablet  2  . methylcellulose (FIBER THERAPY) oral powder Take 1 packet by mouth 2 (two) times daily as needed (constipation).       . traMADol (ULTRAM) 50 MG tablet Take 1 tablet (50 mg total) by mouth every 12 (twelve) hours as needed.  60 tablet  0   No current facility-administered medications for this visit.    No Known Allergies  Family History  Problem Relation Age of Onset  . Diabetes Mother   . Hyperlipidemia Mother   . Cancer Father   . Cancer  Sister   . Diabetes Maternal Aunt   . Arthritis Maternal Grandmother   . Diabetes Maternal Grandmother   . Arthritis Maternal Grandfather   . Arthritis Paternal Grandmother     History   Social History  . Marital Status: Married    Spouse Name: N/A    Number of Children: N/A  . Years of Education: N/A   Occupational History  . Not on file.   Social History Main Topics  . Smoking status: Former Research scientist (life sciences)  . Smokeless tobacco: Former Systems developer    Types: Chew  . Alcohol Use: Yes     Comment: moderate  . Drug Use: No  . Sexual Activity: Yes   Other Topics Concern  . Not on file   Social History Narrative  . No narrative on file     Constitutional: Denies fever, malaise, fatigue, headache or abrupt weight changes.  HEENT: Denies eye pain, eye redness, ear pain, ringing in the ears, wax buildup, runny nose, nasal congestion, bloody nose, or sore throat. Respiratory: Denies difficulty breathing, shortness of breath, cough or sputum production.   Cardiovascular: Denies chest pain, chest tightness, palpitations or swelling in the hands or feet.  Gastrointestinal: Denies abdominal pain, bloating,  constipation, diarrhea or blood in the stool.  GU: Denies urgency, frequency, pain with urination, burning sensation, blood in urine, odor or discharge. Musculoskeletal: Denies decrease in range of motion, difficulty with gait, muscle pain or joint pain and swelling.  Skin: Denies redness, rashes, lesions or ulcercations.  Neurological: Denies dizziness, difficulty with memory, difficulty with speech or problems with balance and coordination.   No other specific complaints in a complete review of systems (except as listed in HPI above).  Objective:   Physical Exam   BP 120/82  Pulse 87  Temp(Src) 98.7 F (37.1 C) (Oral)  Wt 232 lb (105.235 kg)  SpO2 98% Wt Readings from Last 3 Encounters:  12/28/13 232 lb (105.235 kg)  12/25/13 236 lb 4 oz (107.162 kg)  11/24/13 221 lb 9.6 oz  (100.517 kg)    General: Appears his stated age, well developed, well nourished in NAD. Cardiovascular: Normal rate and rhythm. S1,S2 noted.  No murmur, rubs or gallops noted. No JVD or BLE edema. Varicose veins noted on back of leg with possible superficial clot. Pulmonary/Chest: Normal effort and positive vesicular breath sounds. No respiratory distress. No wheezes, rales or ronchi noted.  Skin: Quarter size cyst noted on anterior right ankle.   BMET    Component Value Date/Time   NA 139 10/25/2013 1602   K 4.4 10/25/2013 1602   CL 105 10/25/2013 1602   CO2 28 10/25/2013 1602   GLUCOSE 84 10/25/2013 1602   BUN 19 10/25/2013 1602   CREATININE 1.3 10/25/2013 1602   CALCIUM 9.5 10/25/2013 1602    Lipid Panel     Component Value Date/Time   CHOL 204* 10/25/2013 1602   TRIG 125.0 10/25/2013 1602   HDL 34.20* 10/25/2013 1602   CHOLHDL 6 10/25/2013 1602   VLDL 25.0 10/25/2013 1602    CBC    Component Value Date/Time   WBC 6.4 10/25/2013 1602   RBC 5.87* 10/25/2013 1602   HGB 15.1 10/25/2013 1602   HCT 45.9 10/25/2013 1602   PLT 238.0 10/25/2013 1602   MCV 78.3 10/25/2013 1602   MCHC 33.0 10/25/2013 1602   RDW 15.4* 10/25/2013 1602    Hgb A1C Lab Results  Component Value Date   HGBA1C 7.1* 10/25/2013        Assessment & Plan:   Varicose Veins:  Try ASA and warm compresses Ok to try compression stockings while at work Will refer to vein specialist  Cyst of ankle:  Not causing pain at this time Continue to monitor  RTC as needed or if symptoms persist or worsen

## 2013-12-28 NOTE — Progress Notes (Signed)
Pre visit review using our clinic review tool, if applicable. No additional management support is needed unless otherwise documented below in the visit note. 

## 2013-12-28 NOTE — Patient Instructions (Addendum)
  Cyst Removal °Your caregiver has removed a cyst. A cyst is a sac containing a semi-solid material. Cysts may occur any place on your body. They may remain small for years or gradually get larger. A sebaceous cyst is an enlarged (dilated) sweat gland filled with old sweat (sebum). Unattended, these may become large (the size of a softball) over several years time. These are often removed for improved appearance (cosmetic) reasons or before they become infected to form an abscess. An abscess is an infected cyst. °HOME CARE INSTRUCTIONS  °· Keep your bandage clean and dry. You may change your bandage after 24 hours. If your bandage sticks, use warm water to gently loosen it. Pat the area dry with a clean towel before putting on another bandage. °· If possible, keep the area where the cyst was removed raised to relieve soreness, swelling, and promote healing. °· If you have stitches, keep them clean and dry. °· You may clean your stitches gently with a cotton swab dipped in warm soapy water. °· Do not soak the area where the cyst was removed or go swimming. You may shower. °· Do not overuse the area where your cyst was removed. °· Return in 7 days or as directed to have your stitches removed. °· Take medicines as instructed by your caregiver. °SEEK IMMEDIATE MEDICAL CARE IF:  °· An oral temperature above 102° F (38.9° C) develops, not controlled by medication. °· Blood continues to soak through the bandage. °· You have increasing pain in the area where your cyst was removed. °· You have redness, swelling, pus, a bad smell, soreness (inflammation), or red streaks coming away from the stitches. These are signs of infection. °MAKE SURE YOU:  °· Understand these instructions. °· Will watch your condition. °· Will get help right away if you are not doing well or get worse. °Document Released: 10/01/2000 Document Revised: 12/27/2011 Document Reviewed: 01/25/2008 °[employer]® Patient Information ©2014 [employer]. ° °

## 2014-01-01 ENCOUNTER — Encounter: Payer: Self-pay | Admitting: Gastroenterology

## 2014-01-11 ENCOUNTER — Other Ambulatory Visit: Payer: Self-pay | Admitting: Internal Medicine

## 2014-01-11 DIAGNOSIS — E119 Type 2 diabetes mellitus without complications: Secondary | ICD-10-CM

## 2014-01-21 ENCOUNTER — Other Ambulatory Visit (INDEPENDENT_AMBULATORY_CARE_PROVIDER_SITE_OTHER): Payer: 59

## 2014-01-21 DIAGNOSIS — E119 Type 2 diabetes mellitus without complications: Secondary | ICD-10-CM

## 2014-01-21 LAB — MICROALBUMIN / CREATININE URINE RATIO
Creatinine,U: 119.5 mg/dL
MICROALB/CREAT RATIO: 0.2 mg/g (ref 0.0–30.0)
Microalb, Ur: 0.2 mg/dL (ref 0.0–1.9)

## 2014-01-21 LAB — HEMOGLOBIN A1C: HEMOGLOBIN A1C: 6.8 % — AB (ref 4.6–6.5)

## 2014-01-28 ENCOUNTER — Ambulatory Visit (INDEPENDENT_AMBULATORY_CARE_PROVIDER_SITE_OTHER): Payer: 59 | Admitting: Internal Medicine

## 2014-01-28 ENCOUNTER — Encounter: Payer: Self-pay | Admitting: Internal Medicine

## 2014-01-28 VITALS — BP 120/84 | HR 72 | Temp 98.1°F | Wt 243.2 lb

## 2014-01-28 DIAGNOSIS — E119 Type 2 diabetes mellitus without complications: Secondary | ICD-10-CM

## 2014-01-28 DIAGNOSIS — M25561 Pain in right knee: Secondary | ICD-10-CM

## 2014-01-28 DIAGNOSIS — IMO0001 Reserved for inherently not codable concepts without codable children: Secondary | ICD-10-CM

## 2014-01-28 DIAGNOSIS — M25569 Pain in unspecified knee: Secondary | ICD-10-CM

## 2014-01-28 DIAGNOSIS — M25562 Pain in left knee: Secondary | ICD-10-CM

## 2014-01-28 MED ORDER — METFORMIN HCL 500 MG PO TABS: 1000.0000 mg | ORAL_TABLET | Freq: Two times a day (BID) | ORAL | Status: DC

## 2014-01-28 NOTE — Progress Notes (Signed)
Pre visit review using our clinic review tool, if applicable. No additional management support is needed unless otherwise documented below in the visit note. 

## 2014-01-28 NOTE — Assessment & Plan Note (Signed)
A1C better on Metformin Will increase Metformin to 1000 mg BID with meals Encouraged him to continue to work on his diet and exercise

## 2014-01-28 NOTE — Progress Notes (Signed)
Subjective:    Patient ID: Marc Aguilar, male    DOB: [birth year], 53 y.o.   MRN: [id number]  HPI  Pt presents to the clinic today for 3 month followup of DM2. He was diagnosed in January. His A1C was 7.1%. He was started on Metformin 500 mg BID. He did have some initial nausea and vomiting during the first week, but that has since subsided. He had been working on diet and exercise. He originally lost 25 pounds but has gained 22 lbs of that back. He did repeat his A1C 4/6. It was 6.8%.  Additionally, he reports some muscle pain/knee pain in his legs. 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 He describes it as as achy, soreness. He denies any specific injury to the area. He does have some associated swelling which resolves with leg elevation. He does take Ibuprofen OTC which does help.  Review of Systems      Past Medical History  Diagnosis Date  . GERD (gastroesophageal reflux disease)   . Heart murmur   . Chicken pox   . Diabetes mellitus without complication     Current Outpatient Prescriptions  Medication Sig Dispense Refill  . glucose blood test strip Ck blood sugar twice a day and as directed. Dx 250.00      . hydrochlorothiazide (MICROZIDE) 12.5 MG capsule Take 1 tablet by mouth daily x 3 days (extra given in case we need to repeat)  10 capsule  0  . ibuprofen (ADVIL,MOTRIN) 600 MG tablet Take 600 mg by mouth every 6 (six) hours as needed for moderate pain.       . metFORMIN (GLUCOPHAGE) 500 MG tablet Take 1 tablet (500 mg total) by mouth 2 (two) times daily with a meal.  60 tablet  2  . methylcellulose (FIBER THERAPY) oral powder Take 1 packet by mouth 2 (two) times daily as needed (constipation).       . traMADol (ULTRAM) 50 MG tablet Take 1 tablet (50 mg total) by mouth every 12 (twelve) hours as needed.  60 tablet  0   No current facility-administered medications for this visit.    No Known Allergies  Family History  Problem Relation Age of Onset  . Diabetes  Mother   . Hyperlipidemia Mother   . Cancer Father   . Cancer Sister   . Diabetes Maternal Aunt   . Arthritis Maternal Grandmother   . Diabetes Maternal Grandmother   . Arthritis Maternal Grandfather   . Arthritis Paternal Grandmother     History   Social History  . Marital Status: Married    Spouse Name: N/A    Number of Children: N/A  . Years of Education: N/A   Occupational History  . Not on file.   Social History Main Topics  . Smoking status: Former Research scientist (life sciences)  . Smokeless tobacco: Former Systems developer    Types: Chew  . Alcohol Use: Yes     Comment: moderate  . Drug Use: No  . Sexual Activity: Yes   Other Topics Concern  . Not on file   Social History Narrative  . No narrative on file     Constitutional: Denies fever, malaise, fatigue, headache or abrupt weight changes.  HEENT: Denies eye pain, eye redness, ear pain, ringing in the ears, wax buildup, runny nose, nasal congestion, bloody nose, or sore throat. Gastrointestinal: Denies polydipsia, constipation, abdominal pain or blood in the stool.  GU: Pt reports frequency. Denies urgency, pain with urination, burning sensation, blood  in urine, odor or discharge. MSK: Pt reports myalgias and knee pain. Denies difficulty with gait. Neurological: Denies numbness or tingling in hands or feet, problems  with balance and coordination.   No other specific complaints in a complete review of systems (except as listed in HPI above).  Objective:   Physical Exam   BP 120/84  Pulse 72  Temp(Src) 98.1 F (36.7 C) (Oral)  Wt 243 lb 4 oz (110.337 kg) Wt Readings from Last 3 Encounters:  01/28/14 243 lb 4 oz (110.337 kg)  12/28/13 232 lb (105.235 kg)  12/25/13 236 lb 4 oz (107.162 kg)    General: Appears his stated age, well developed, well nourished in NAD. Cardiovascular: Normal rate and rhythm. S1,S2 noted.  No murmur, rubs or gallops noted. No JVD or BLE edema. No carotid bruits noted. Pulmonary/Chest: Normal effort and  positive vesicular breath sounds. No respiratory distress. No wheezes, rales or ronchi noted.  Abdomen: Soft and nontender. Normal bowel sounds, no bruits noted. No distention or masses noted. Liver, spleen and kidneys non palpable.  MSK: Normal bilateral knee ROM. Normal strength of BLE. Neurological: Alert and oriented. Sensation intact to 10 gm monofilament. Coordination normal. +DTRs bilaterally.   BMET    Component Value Date/Time   NA 139 10/25/2013 1602   K 4.4 10/25/2013 1602   CL 105 10/25/2013 1602   CO2 28 10/25/2013 1602   GLUCOSE 84 10/25/2013 1602   BUN 19 10/25/2013 1602   CREATININE 1.3 10/25/2013 1602   CALCIUM 9.5 10/25/2013 1602    Lipid Panel     Component Value Date/Time   CHOL 204* 10/25/2013 1602   TRIG 125.0 10/25/2013 1602   HDL 34.20* 10/25/2013 1602   CHOLHDL 6 10/25/2013 1602   VLDL 25.0 10/25/2013 1602    CBC    Component Value Date/Time   WBC 6.4 10/25/2013 1602   RBC 5.87* 10/25/2013 1602   HGB 15.1 10/25/2013 1602   HCT 45.9 10/25/2013 1602   PLT 238.0 10/25/2013 1602   MCV 78.3 10/25/2013 1602   MCHC 33.0 10/25/2013 1602   RDW 15.4* 10/25/2013 1602    Hgb A1C Lab Results  Component Value Date   HGBA1C 6.8* 01/21/2014        Assessment & Plan:   Myositis and knee pain:  He would like to be worked up by an Secondary school teacher Encouraged him to stretch and continue ibuprofen Orthopedic referral placed  RTC in 6 months or sooner if needed

## 2014-01-28 NOTE — Patient Instructions (Addendum)
Type 2 Diabetes Mellitus, Adult Type 2 diabetes mellitus, often simply referred to as type 2 diabetes, is a long-lasting (chronic) disease. In type 2 diabetes, the pancreas does not make enough insulin (a hormone), the cells are less responsive to the insulin that is made (insulin resistance), or both. Normally, insulin moves sugars from food into the tissue cells. The tissue cells use the sugars for energy. The lack of insulin or the lack of normal response to insulin causes excess sugars to build up in the blood instead of going into the tissue cells. As a result, high blood sugar (hyperglycemia) develops. The effect of high sugar (glucose) levels can cause many complications. Type 2 diabetes was also previously called adult-onset diabetes but it can occur at any age.  RISK FACTORS  A person is predisposed to developing type 2 diabetes if someone in the family has the disease and also has one or more of the following primary risk factors:  Overweight.  An inactive lifestyle.  A history of consistently eating high-calorie foods. Maintaining a normal weight and regular physical activity can reduce the chance of developing type 2 diabetes. SYMPTOMS  A person with type 2 diabetes may not show symptoms initially. The symptoms of type 2 diabetes appear slowly. The symptoms include:  Increased thirst (polydipsia).  Increased urination (polyuria).  Increased urination during the night (nocturia).  Weight loss. This weight loss may be rapid.  Frequent, recurring infections.  Tiredness (fatigue).  Weakness.  Vision changes, such as blurred vision.  Fruity smell to your breath.  Abdominal pain.  Nausea or vomiting.  Cuts or bruises which are slow to heal.  Tingling or numbness in the hands or feet. DIAGNOSIS Type 2 diabetes is frequently not diagnosed until complications of diabetes are present. Type 2 diabetes is diagnosed when symptoms or complications are present and when blood  glucose levels are increased. Your blood glucose level may be checked by one or more of the following blood tests:  A fasting blood glucose test. You will not be allowed to eat for at least 8 hours before a blood sample is taken.  A random blood glucose test. Your blood glucose is checked at any time of the day regardless of when you ate.  A hemoglobin A1c blood glucose test. A hemoglobin A1c test provides information about blood glucose control over the previous 3 months.  An oral glucose tolerance test (OGTT). Your blood glucose is measured after you have not eaten (fasted) for 2 hours and then after you drink a glucose-containing beverage. TREATMENT   You may need to take insulin or diabetes medicine daily to keep blood glucose levels in the desired range.  You will need to match insulin dosing with exercise and healthy food choices. The treatment goal is to maintain the before meal blood sugar (preprandial glucose) level at 70 130 mg/dL. HOME CARE INSTRUCTIONS   Have your hemoglobin A1c level checked twice a year.  Perform daily blood glucose monitoring as directed by your caregiver.  Monitor urine ketones when you are ill and as directed by your caregiver.  Take your diabetes medicine or insulin as directed by your caregiver to maintain your blood glucose levels in the desired range.  Never run out of diabetes medicine or insulin. It is needed every day.  Adjust insulin based on your intake of carbohydrates. Carbohydrates can raise blood glucose levels but need to be included in your diet. Carbohydrates provide vitamins, minerals, and fiber which are an essential part of   a healthy diet. Carbohydrates are found in fruits, vegetables, whole grains, dairy products, legumes, and foods containing added sugars.    Eat healthy foods. Alternate 3 meals with 3 snacks.  Lose weight if overweight.  Carry a medical alert card or wear your medical alert jewelry.  Carry a 15 gram  carbohydrate snack with you at all times to treat low blood glucose (hypoglycemia). Some examples of 15 gram carbohydrate snacks include:  Glucose tablets, 3 or 4   Glucose gel, 15 gram tube  Raisins, 2 tablespoons (24 grams)  Jelly beans, 6  Animal crackers, 8  Regular pop, 4 ounces (120 mL)  Gummy treats, 9  Recognize hypoglycemia. Hypoglycemia occurs with blood glucose levels of 70 mg/dL and below. The risk for hypoglycemia increases when fasting or skipping meals, during or after intense exercise, and during sleep. Hypoglycemia symptoms can include:  Tremors or shakes.  Decreased ability to concentrate.  Sweating.  Increased heart rate.  Headache.  Dry mouth.  Hunger.  Irritability.  Anxiety.  Restless sleep.  Altered speech or coordination.  Confusion.  Treat hypoglycemia promptly. If you are alert and able to safely swallow, follow the 15:15 rule:  Take 15 20 grams of rapid-acting glucose or carbohydrate. Rapid-acting options include glucose gel, glucose tablets, or 4 ounces (120 mL) of fruit juice, regular soda, or low fat milk.  Check your blood glucose level 15 minutes after taking the glucose.  Take 15 20 grams more of glucose if the repeat blood glucose level is still 70 mg/dL or below.  Eat a meal or snack within 1 hour once blood glucose levels return to normal.    Be alert to polyuria and polydipsia which are early signs of hyperglycemia. An early awareness of hyperglycemia allows for prompt treatment. Treat hyperglycemia as directed by your caregiver.  Engage in at least 150 minutes of moderate-intensity physical activity a week, spread over at least 3 days of the week or as directed by your caregiver. In addition, you should engage in resistance exercise at least 2 times a week or as directed by your caregiver.  Adjust your medicine and food intake as needed if you start a new exercise or sport.  Follow your sick day plan at any time you  are unable to eat or drink as usual.  Avoid tobacco use.  Limit alcohol intake to no more than 1 drink per day for nonpregnant women and 2 drinks per day for men. You should drink alcohol only when you are also eating food. Talk with your caregiver whether alcohol is safe for you. Tell your caregiver if you drink alcohol several times a week.  Follow up with your caregiver regularly.  Schedule an eye exam soon after the diagnosis of type 2 diabetes and then annually.  Perform daily skin and foot care. Examine your skin and feet daily for cuts, bruises, redness, nail problems, bleeding, blisters, or sores. A foot exam by a caregiver should be done annually.  Brush your teeth and gums at least twice a day and floss at least once a day. Follow up with your dentist regularly.  Share your diabetes management plan with your workplace or school.  Stay up-to-date with immunizations.  Learn to manage stress.  Obtain ongoing diabetes education and support as needed.  Participate in, or seek rehabilitation as needed to maintain or improve independence and quality of life. Request a physical or occupational therapy referral if you are having foot or hand numbness or difficulties with grooming,   dressing, eating, or physical activity. SEEK MEDICAL CARE IF:   You are unable to eat food or drink fluids for more than 6 hours.  You have nausea and vomiting for more than 6 hours.  Your blood glucose level is over 240 mg/dL.  There is a change in mental status.  You develop an additional serious illness.  You have diarrhea for more than 6 hours.  You have been sick or have had a fever for a couple of days and are not getting better.  You have pain during any physical activity.  SEEK IMMEDIATE MEDICAL CARE IF:  You have difficulty breathing.  You have moderate to large ketone levels. MAKE SURE YOU:  Understand these instructions.  Will watch your condition.  Will get help right away if  you are not doing well or get worse. Document Released: 10/04/2005 Document Revised: 06/28/2012 Document Reviewed: 05/02/2012 [employer] Patient Information 2014 [employer].  

## 2014-02-08 ENCOUNTER — Other Ambulatory Visit: Payer: Self-pay

## 2014-02-08 MED ORDER — GLUCOSE BLOOD VI STRP: 1.0000 | ORAL_STRIP | Status: DC | PRN

## 2014-02-08 MED ORDER — ALCOHOL PREPS PADS: 1.0000 | MEDICATED_PAD | Status: DC | PRN

## 2014-02-08 MED ORDER — LANCETS ULTRA FINE MISC: 1.0000 | Status: DC | PRN

## 2014-02-11 ENCOUNTER — Other Ambulatory Visit: Payer: Self-pay | Admitting: *Deleted

## 2014-02-11 DIAGNOSIS — I83893 Varicose veins of bilateral lower extremities with other complications: Secondary | ICD-10-CM

## 2014-02-25 ENCOUNTER — Encounter: Payer: 59 | Admitting: Gastroenterology

## 2014-03-06 ENCOUNTER — Ambulatory Visit (INDEPENDENT_AMBULATORY_CARE_PROVIDER_SITE_OTHER): Payer: 59 | Admitting: Sports Medicine

## 2014-03-06 ENCOUNTER — Ambulatory Visit (HOSPITAL_COMMUNITY)
Admission: RE | Admit: 2014-03-06 | Discharge: 2014-03-06 | Disposition: A | Discharge status: Home / Self Care | Payer: 59 | Source: Ambulatory Visit | Attending: Family Medicine | Admitting: Family Medicine

## 2014-03-06 ENCOUNTER — Encounter: Payer: Self-pay | Admitting: Sports Medicine

## 2014-03-06 VITALS — BP 126/75 | Ht 72.0 in | Wt 240.0 lb

## 2014-03-06 DIAGNOSIS — M25561 Pain in right knee: Secondary | ICD-10-CM

## 2014-03-06 DIAGNOSIS — M259 Joint disorder, unspecified: Secondary | ICD-10-CM | POA: Insufficient documentation

## 2014-03-06 DIAGNOSIS — S99919A Unspecified injury of unspecified ankle, initial encounter: Principal | ICD-10-CM

## 2014-03-06 DIAGNOSIS — Y33XXXA Other specified events, undetermined intent, initial encounter: Secondary | ICD-10-CM | POA: Insufficient documentation

## 2014-03-06 DIAGNOSIS — M25569 Pain in unspecified knee: Secondary | ICD-10-CM

## 2014-03-06 DIAGNOSIS — S8990XA Unspecified injury of unspecified lower leg, initial encounter: Secondary | ICD-10-CM | POA: Insufficient documentation

## 2014-03-06 DIAGNOSIS — S99929A Unspecified injury of unspecified foot, initial encounter: Principal | ICD-10-CM

## 2014-03-06 DIAGNOSIS — M25562 Pain in left knee: Secondary | ICD-10-CM

## 2014-03-06 MED ORDER — MELOXICAM 15 MG PO TABS: 15.0000 mg | ORAL_TABLET | Freq: Every day | ORAL | Status: DC

## 2014-03-06 MED ORDER — METHYLPREDNISOLONE ACETATE 40 MG/ML IJ SUSP
40.0000 mg | Freq: Once | INTRAMUSCULAR | Status: AC
  Administered 2014-03-06: 40 mg via INTRA_ARTICULAR

## 2014-03-06 NOTE — Progress Notes (Signed)
CC: Bilateral leg pain HPI: Patient is a very pleasant 53 year old male referred by Ms. 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 He states that this has been going on for at least 6 months. He notes pain that seems to be in the muscles around his knees. Left side is worse than right. Pain seems to be predominantly superior to the patella on both sides. He is tried some ibuprofen for this. His pain is exacerbated by walking. Sometimes he will also get shin pain and thigh pain. He also notes that for a longer period of time he has had some lateral thigh pain and numbness. It will feel like his thigh is on fire. He is not sure if this is coming from his back.  ROS: As above in the HPI. All other systems are stable or negative.  PMH: Type 2 diabetes, hypertension. Meds reviewed in EMR  Social: Patient works as an EMT in the [employer]. He quit smoking greater than 10 years ago. He drinks alcohol 2-3 times a week. Family: Family history reviewed in EMR Allergies: No known drug allergies    OBJECTIVE: APPEARANCE:  Patient in no acute distress.The patient appeared well nourished and normally developed. HEENT: No scleral icterus. Conjunctiva non-injected Resp: Non labored Skin: No rash MSK:  Bilateral Hip exam:  - No swelling or deformity - Full range of motion in flexion, extension, internal and external rotation without pain - Negative FADIR - Neurovascularly intact  Bilateral Knee - With quadriceps contraction there is a divot at the vastus lateralis on the right greater than left consistent with partial vastus lateralis tear - There is a moderate to large knee effusion bilaterally - ROM normal in flexion and extension. - Strength 5/5 in flexion and extension. - Ligaments with solid consistent endpoints including ACL, PCL, LCL, MCL.  - No significant joint line tenderness  There is a 1-1.5 cm leg length discrepancy with right  leg shorter than the left With walking he externally rotates his right foot  MSK [place]: Ultrasound of the bilateral knees was performed today. There are large joint effusions extending superiorly in the suprapatellar space to it total length of 7 cm. On the right there is evidence of partial quadriceps rupture in the vastus lateralis. There is joint space narrowing on the medial and lateral aspects of the left knee.   ASSESSMENT: #1. Bilateral knee and leg pain, presumably secondary to large joint effusions, suspect arthritis #2. Right partial vastus lateralis tear, may be contributing to fluid accumulation #3. Leg length discrepancy with right leg 1 cm short #4. Lateral thigh pain and burning of unclear etiology, may be unrelated   PLAN: I suspect that a lot of his leg pain up and down his legs is actually coming from the effusions within his knee. We therefore recommended knee aspiration and corticosteroid injection bilaterally. See procedure note below. He was also given a body helix knee sleeves to wear during exercise and while at work. Hopefully this will help limit reaccumulation of the Effusion. We will obtain standing x-rays of his knees. We will also start him on meloxicam 15 mg daily. He was cautioned not to take any other NSAIDs.  Consent obtained and verified.  Time-out conducted.  Noted no overlying erythema, induration, or other signs of local infection.  Skin prepped in a sterile fashion.  Topical analgesic spray: Ethyl chloride.  Joint: Right knee using superolateral approach Skin cleansed with Betadine  and alcohol swab. Skin then anesthetized with 2 cc of 1% lidocaine without epi. Joint then aspirated with return of 35 cc of mildly cloudy blood-tinged synovial fluid. Joint then injected with 4 cc of 1% lidocaine and 1 cc of Depo-Medrol 40. Completed without difficulty.  Advised to call if fevers/chills, erythema, induration, drainage, or persistent bleeding.  Consent obtained  and verified.  Time-out conducted.  Noted no overlying erythema, induration, or other signs of local infection.  Skin prepped in a sterile fashion.  Topical analgesic spray: Ethyl chloride.  Joint: Left knee using superolateral approach Skin cleansed with Betadine and alcohol swab. Skin then anesthetized with 2 cc of 1% lidocaine without epi. Joint then aspirated with return of 55 cc of mildly cloudy yellow synovial fluid. Joint then injected with 4 cc of 1% lidocaine and 1 cc of Depo-Medrol 40. Completed without difficulty.  Advised to call if fevers/chills, erythema, induration, drainage, or persistent bleeding.

## 2014-03-06 NOTE — Addendum Note (Signed)
Addended by: Lianne Bushy on: 03/06/2014 03:01 PM   Modules accepted: Orders

## 2014-03-06 NOTE — Patient Instructions (Signed)
Thank you for coming in today  1. We injected your knees today. We will check for gout. Watch your sugars, may bump for 2-3 days 2. Wear compression sleeves during work and exercise 3. Meloxicam daily with breakfast, no ibuprofen/advil/aleve while on this. No other NSAIDs. 4. Xrays of your knees  Followup 1 month.

## 2014-03-07 LAB — SYNOVIAL CELL COUNT + DIFF, W/ CRYSTALS
CRYSTALS FLUID: NONE SEEN
Eosinophils-Synovial: 0 % (ref 0–1)
Lymphocytes-Synovial Fld: 28 % — ABNORMAL HIGH (ref 0–20)
Monocyte/Macrophage: 17 % — ABNORMAL LOW (ref 50–90)
Neutrophil, Synovial: 55 % — ABNORMAL HIGH (ref 0–25)
WBC, Synovial: 4905 cu mm — ABNORMAL HIGH (ref 0–200)

## 2014-03-08 ENCOUNTER — Telehealth: Payer: Self-pay | Admitting: Family Medicine

## 2014-03-08 NOTE — Telephone Encounter (Signed)
Called patient to discuss xray and lab results. Xrays show only mild to moderate arthritis, left > right. Fluid studies show no gout. Definitely had effusions out of proportion to degree of arthritis. His knees feel great right now. May need to consider rheum workup if these keep occuring in the future as degree of effusion is definitely out of proportion to degree of arthritis on xray.

## 2014-03-14 ENCOUNTER — Ambulatory Visit (AMBULATORY_SURGERY_CENTER): Payer: Self-pay | Admitting: *Deleted

## 2014-03-14 VITALS — Ht 72.0 in | Wt 238.8 lb

## 2014-03-14 DIAGNOSIS — Z1211 Encounter for screening for malignant neoplasm of colon: Secondary | ICD-10-CM

## 2014-03-14 MED ORDER — MOVIPREP 100 G PO SOLR: ORAL | Status: DC

## 2014-03-14 NOTE — Progress Notes (Signed)
No allergies to eggs or soy. No problems with anesthesia.  Pt given Emmi instructions for colonoscopy  No oxygen use  No diet drug use  

## 2014-03-19 ENCOUNTER — Ambulatory Visit (INDEPENDENT_AMBULATORY_CARE_PROVIDER_SITE_OTHER): Payer: 59 | Admitting: Sports Medicine

## 2014-03-19 ENCOUNTER — Encounter: Payer: Self-pay | Admitting: Sports Medicine

## 2014-03-19 ENCOUNTER — Encounter: Payer: Self-pay | Admitting: Vascular Surgery

## 2014-03-19 VITALS — BP 135/81 | Ht 72.0 in | Wt 238.0 lb

## 2014-03-19 DIAGNOSIS — M25569 Pain in unspecified knee: Secondary | ICD-10-CM

## 2014-03-19 DIAGNOSIS — M25469 Effusion, unspecified knee: Secondary | ICD-10-CM

## 2014-03-19 DIAGNOSIS — M25561 Pain in right knee: Secondary | ICD-10-CM

## 2014-03-19 MED ORDER — METHYLPREDNISOLONE ACETATE 40 MG/ML IJ SUSP
40.0000 mg | Freq: Once | INTRAMUSCULAR | Status: AC
  Administered 2014-03-19: 40 mg via INTRA_ARTICULAR

## 2014-03-19 MED ORDER — IBUPROFEN 800 MG PO TABS: 800.0000 mg | ORAL_TABLET | Freq: Three times a day (TID) | ORAL | Status: DC | PRN

## 2014-03-19 NOTE — Progress Notes (Signed)
   Subjective:    Patient ID: Marc Aguilar, male    DOB: Jul 21, 1961, 53 y.o.   MRN: [id number]  HPI Patient comes in today with returning bilateral knee swelling. He was last seen in the office on May 20. He was evaluated by Dr. Maryln Gottron and Dr. Oneida Alar. Both knees were aspirated and injected with cortisone. Knee aspirate was sent off for analysis. Just under 5000 white blood cells were seen. No crystals. He immediately after the injection the patient did great. However, he began to experience some increased swelling late last week, right knee greater than left. No fevers or chills. He does have some diffuse pain in each knee. X-rays of his knees were ordered and show no significant degenerative changes. He is currently taking meloxicam but he has not found it to be beneficial. Prior to meloxicam he was taking over-the-counter ibuprofen which seemed to help more. He is also tried compression which has not been of much benefit. Some pain proximally in his thighs as well. He has had intermittent knee pain and swelling for a couple of years. No specific diagnosis has been given. He denies any prior knee surgery. He denies any personal history or family history of rheumatologic disease; he denies swelling in any other joints. He denies fevers or chills. No known trauma.    Review of Systems     Objective:   Physical Exam Well-developed, well-nourished. No acute distress. Awake alert and oriented x3. Vital signs reviewed.  Right knee: Range of motion 0-120. 1-2+ effusion. Slightly warm to touch. No erythema. No real bony or soft tissue tenderness to palpation. Negative McMurray's. Good joint stability. Neurovascularly intact distally.  Left knee: Range of motion is 0-130. Trace effusion. Normal temperature. No erythema. Knee is stable to ligamentous exam. Negative McMurray's. Neurovascular intact distally.  X-rays of his knees are as above. Overall, there is a paucity of degenerative changes seen.       Assessment & Plan:  Bilateral knee pain and swelling of unknown etiology, right greater than left  Right knee is aspirated injected today. A total of 40 cc of slightly cloudy synovial fluid was aspirated. This was followed by an injection of 40 mg of Depo-Medrol and 3 cc of Xylocaine. Patient tolerated this without difficulty. He will discontinue his meloxicam and I've given him a prescription for ibuprofen 800 mg to take 3 times daily as needed for pain and swelling. I want him to resume using compression on his knees. I would like to get further imaging of the more symptomatic right knee in the form of an MRI since his x-rays are basically unremarkable. I would also like to get a sed rate, C-reactive protein, ANA, rheumatoid factor, and anti-CCP antibody. Patient will followup as scheduled. He may eventually need a referral to rheumatology.  Consent obtained and verified. Time-out conducted. Noted no overlying erythema, induration, or other signs of local infection. Skin prepped in a sterile fashion. Topical analgesic spray: Ethyl chloride. Joint: right knee aspiration and injection, superiolateral approach Needle: 25g 1.5 inch for local anesthesia and 18g 1.5 inch for aspiration Completed without difficulty. Meds: 3cc 1% xylocaine for local anesthesia followed by intraarticular injection of 3cc 1% xylocaine and 1cc (40mg ) depomedrol  Advised to call if fevers/chills, erythema, induration, drainage, or persistent bleeding.

## 2014-03-19 NOTE — Patient Instructions (Signed)
[facility] 245PM [phone]

## 2014-03-19 NOTE — Addendum Note (Signed)
Addended by: Cyd Silence on: 03/19/2014 04:53 PM   Modules accepted: Orders

## 2014-03-19 NOTE — Addendum Note (Signed)
Addended by: Lianne Bushy on: 03/19/2014 05:01 PM   Modules accepted: Orders

## 2014-03-20 ENCOUNTER — Ambulatory Visit (HOSPITAL_COMMUNITY)
Admission: RE | Admit: 2014-03-20 | Discharge: 2014-03-20 | Disposition: A | Discharge status: Home / Self Care | Payer: 59 | Source: Ambulatory Visit | Attending: Vascular Surgery | Admitting: Vascular Surgery

## 2014-03-20 ENCOUNTER — Ambulatory Visit (INDEPENDENT_AMBULATORY_CARE_PROVIDER_SITE_OTHER): Payer: 59 | Admitting: Vascular Surgery

## 2014-03-20 ENCOUNTER — Encounter: Payer: Self-pay | Admitting: Vascular Surgery

## 2014-03-20 VITALS — BP 138/85 | HR 78 | Ht 72.0 in | Wt 236.0 lb

## 2014-03-20 DIAGNOSIS — I83893 Varicose veins of bilateral lower extremities with other complications: Secondary | ICD-10-CM | POA: Insufficient documentation

## 2014-03-20 LAB — SYNOVIAL CELL COUNT + DIFF, W/ CRYSTALS
Eosinophils-Synovial: 0 % (ref 0–1)
Lymphocytes-Synovial Fld: 77 % — ABNORMAL HIGH (ref 0–20)
Monocyte/Macrophage: 9 % — ABNORMAL LOW (ref 50–90)
Neutrophil, Synovial: 14 % (ref 0–25)
WBC, Synovial: 5430 cu mm — ABNORMAL HIGH (ref 0–200)

## 2014-03-20 LAB — CYCLIC CITRUL PEPTIDE ANTIBODY, IGG: Cyclic Citrullin Peptide Ab: <2 U/mL (ref 0.0–5.0)

## 2014-03-20 NOTE — Progress Notes (Signed)
[facility] [place] HISTORY AND PHYSICAL   CC:  Swelling and aching in his legs bilaterally.  Webb Silversmith, NP  HPI: This is a 53 y.o. male who works as an Public relations account executive at [employer].  He has had complaints of swelling and aching in his legs for the past couple of months with the right greater than the left.  He states that he did go to the Urgent Care and then was sent to the ED for venous duplex scan, which was negative for DVT.  He was placed on aspirin and was referred to an orthopedic surgeon.  He states that he had fluid drawn off his knees and his legs did feel better.  He states that he had to have more fluid drawn off the left knee yesterday.  He has been taken off the aspirin and was placed on Mobic, which is not discontinued and he is taking ibuprofen.  He states that he does notice some varicosities in his BLE.   He does not wear compression stockings.  He does have a hx of DM, which he takes Metformin.  Past Medical History  Diagnosis Date  . GERD (gastroesophageal reflux disease)   . Heart murmur   . Chicken pox   . Diabetes mellitus without complication   . Allergy   . Sleep apnea     does not use cpap   Past Surgical History  Procedure Laterality Date  . Wrist surgery Left 1984    cyst removal    No Known Allergies  Current Outpatient Prescriptions  Medication Sig Dispense Refill  . ibuprofen (ADVIL,MOTRIN) 600 MG tablet Take 600 mg by mouth every 6 (six) hours as needed for moderate pain.       Marland Kitchen ibuprofen (ADVIL,MOTRIN) 800 MG tablet Take 1 tablet (800 mg total) by mouth every 8 (eight) hours as needed.  60 tablet  1  . meloxicam (MOBIC) 15 MG tablet Take 1 tablet (15 mg total) by mouth daily.  30 tablet  1  . metFORMIN (GLUCOPHAGE) 500 MG tablet Take 2 tablets (1,000 mg total) by mouth 2 (two) times daily with a meal.  120 tablet  5  . methylcellulose (FIBER THERAPY) oral powder Take 1 packet by mouth 2 (two) times daily as needed (constipation).        Marland Kitchen MOVIPREP 100 G SOLR moviprep as directed. No substitutions  1 kit  0  . traMADol (ULTRAM) 50 MG tablet Take 1 tablet (50 mg total) by mouth every 12 (twelve) hours as needed.  60 tablet  0  . Alcohol Swabs (ALCOHOL PREPS) PADS 1 each by Does not apply route as needed.  100 each  3  . glucose blood (ONETOUCH VERIO) test strip 1 each by Other route as needed for other. Use as instructed  100 each  3  . LANCETS ULTRA FINE MISC 1 each by Does not apply route as needed.  100 each  3   No current facility-administered medications for this visit.    Family History  Problem Relation Age of Onset  . Diabetes Mother   . Hyperlipidemia Mother   . Cancer Father   . Cancer Sister   . Diabetes Sister   . Diabetes Maternal Aunt   . Arthritis Maternal Grandmother   . Diabetes Maternal Grandmother   . Arthritis Maternal Grandfather   . Arthritis Paternal Grandmother   . Colon cancer Neg Hx   . Diabetes Brother   . Heart disease Brother  History   Social History  . Marital Status: Married    Spouse Name: N/A    Number of Children: N/A  . Years of Education: N/A   Occupational History  . Not on file.   Social History Main Topics  . Smoking status: Former Smoker    Quit date: 10/18/2009  . Smokeless tobacco: Former Systems developer    Types: Chew    Quit date: 10/18/1994  . Alcohol Use: 3.0 - 3.6 oz/week    1-2 Glasses of wine, 3 Cans of beer, 1 Shots of liquor per week  . Drug Use: No  . Sexual Activity: Yes   Other Topics Concern  . Not on file   Social History Narrative  . No narrative on file     ROS: '[x]'  Positive   '[ ]'  Negative   '[ ]'  All sytems reviewed and are negative  Cardiovascular: '[]'  chest pain/pressure '[]'  palpitations '[]'  SOB lying flat '[]'  DOE '[x]'  pain in legs while walking '[]'  pain in feet when lying flat '[]'  hx of DVT '[]'  hx of phlebitis '[]'  swelling in legs '[]'  varicose veins  Pulmonary: '[]'  productive cough '[]'  asthma '[]'  wheezing  Neurologic: '[]'  weakness  in '[]'  arms '[]'  legs '[x]'  numbness in '[]'  arms '[x]'  legs '[]' difficulty speaking or slurred speech '[]'  temporary loss of vision in one eye '[]'  dizziness  Hematologic: '[]'  bleeding problems '[]'  problems with blood clotting easily  GI '[]'  vomiting blood '[]'  blood in stool  GU: '[]'  burning with urination '[]'  blood in urine  Psychiatric: '[]'  hx of major depression  Integumentary: '[]'  rashes '[]'  ulcers  Constitutional: '[]'  fever '[]'  chills   PHYSICAL EXAMINATION:  Filed Vitals:   03/20/14 1512  BP: 138/85  Pulse: 78   Body mass index is 32 kg/(m^2).  General:  WDWN in NAD Gait: Not observed HENT: WNL, normocephalic Eyes: Pupils equal Pulmonary: normal non-labored breathing , without Rales, rhonchi,  wheezing Cardiac: RRR, without  Murmurs, rubs or gallops; without carotid bruits Abdomen: soft, NT, no masses Skin: without rashes, without ulcers  Vascular Exam/Pulses:  Right Left  Radial 2+ (normal) 2+ (normal)  Ulnar 2+ (normal) 2+ (normal)  Femoral 2+ (normal) 2+ (normal)  Popliteal Non palpable Non palpable  DP 2+ (normal) 2+ (normal)  PT Non palpable Non palpable    Extremities: without ischemic changes, without Gangrene , without cellulitis; without open wounds; mild edema BLE Musculoskeletal: no muscle wasting or atrophy  Neurologic: A&O X 3; Appropriate Affect ; SENSATION: normal; MOTOR FUNCTION:  moving all extremities equally. Speech is fluent/normal   Non-Invasive Vascular Imaging:   Lower extremity venous reflux evaluation 03/20/14: 1.  No evidence of DVT or superficial thrombosis is noted in the RLE 2.  Venous incompetence is noted in the right great saphenous, common femoral, and popliteal veins.  ASSESSMENT: 53 y.o. male with venous incompetence in the right great saphenous, common femoral and popliteal veins.  The scan was negative for DVT  PLAN: -pt is advised to elevate his legs and a diagram is given to her to demonstrate to lie flat on his back with knees  elevated and bent with his feet higher than her knees for 15-20 minutes per day a couple times a day if possible.  -pt is given an Rx for 15-20 mmHg compression stockings  -pt is advised that water aerobics would also be beneficial.  -pt is advised to continue as much walking as possible and avoid sitting or standing for long periods of time.  -  pt will return to [place] on a PRN basis.  Leontine Locket, PA-C [facility] [phone]  Clinic MD:  Pt seen and examined in conjunction with Dr. Scot Dock  Agree with above.  Deitra Mayo, MD, [place] [phone] 03/20/2014

## 2014-03-25 ENCOUNTER — Encounter: Payer: Self-pay | Admitting: Gastroenterology

## 2014-03-25 ENCOUNTER — Ambulatory Visit (AMBULATORY_SURGERY_CENTER): Payer: 59 | Admitting: Gastroenterology

## 2014-03-25 VITALS — BP 126/72 | HR 71 | Temp 96.5°F | Resp 27 | Ht 72.0 in | Wt 238.0 lb

## 2014-03-25 DIAGNOSIS — Z1211 Encounter for screening for malignant neoplasm of colon: Secondary | ICD-10-CM

## 2014-03-25 LAB — GLUCOSE, CAPILLARY
Glucose-Capillary: 98 mg/dL (ref 70–99)
Glucose-Capillary: 92 mg/dL (ref 70–99)

## 2014-03-25 MED ORDER — SODIUM CHLORIDE 0.9 % IV SOLN: 500.0000 mL | INTRAVENOUS | Status: DC

## 2014-03-25 NOTE — Op Note (Signed)
[facility]  [address] [place], [address]   COLONOSCOPY PROCEDURE REPORT  PATIENT: Marc Aguilar, Marc Aguilar  MR#: [id number] BIRTHDATE: [birth year] , 52  yrs. old GENDER: Male ENDOSCOPIST: Milus Banister, MD REFERRED OM:BTDHRC Baity, MD PROCEDURE DATE:  03/25/2014 PROCEDURE:   Colonoscopy, screening First Screening Colonoscopy - Avg.  risk and is 50 yrs.  old or older Yes.  Prior Negative Screening - Now for repeat screening. N/A  History of Adenoma - Now for follow-up colonoscopy & has been > or = to 3 yrs.  N/A  Polyps Removed Today? No.  Recommend repeat exam, <10 yrs? No. ASA CLASS:   Class II INDICATIONS:average risk screening. MEDICATIONS: MAC sedation, administered by CRNA and propofol (Diprivan) 300mg  IV  DESCRIPTION OF PROCEDURE:   After the risks benefits and alternatives of the procedure were thoroughly explained, informed consent was obtained.  A digital rectal exam revealed no abnormalities of the rectum.   The LB [id number]  endoscope was introduced through the anus and advanced to the cecum, which was identified by both the appendix and ileocecal valve. No adverse events experienced.   The quality of the prep was excellent.  The instrument was then slowly withdrawn as the colon was fully examined.   COLON FINDINGS: A normal appearing cecum, ileocecal valve, and appendiceal orifice were identified.  The ascending, hepatic flexure, transverse, splenic flexure, descending, sigmoid colon and rectum appeared unremarkable.  No polyps or cancers were seen. Retroflexed views revealed no abnormalities. The time to cecum=2 minutes 59 seconds.  Withdrawal time=9 minutes 14 seconds.  The scope was withdrawn and the procedure completed. COMPLICATIONS: There were no complications.  ENDOSCOPIC IMPRESSION: Normal colon No polyps or cancers  RECOMMENDATIONS: You should continue to follow colorectal cancer screening guidelines for "routine risk" patients  with a repeat colonoscopy in 10 years.   eSigned:  Milus Banister, MD 03/25/2014 11:04 AM

## 2014-03-25 NOTE — Progress Notes (Signed)
A/ox3 pleased with MAC, report to Jane RN 

## 2014-03-25 NOTE — Patient Instructions (Signed)

## 2014-03-26 ENCOUNTER — Telehealth: Payer: Self-pay

## 2014-03-26 NOTE — Telephone Encounter (Signed)
Unable to speak to patient line was busy times two.

## 2014-04-01 ENCOUNTER — Ambulatory Visit: Payer: 59 | Admitting: Sports Medicine

## 2014-04-03 ENCOUNTER — Ambulatory Visit (HOSPITAL_COMMUNITY)
Admission: RE | Admit: 2014-04-03 | Discharge: 2014-04-03 | Disposition: A | Discharge status: Home / Self Care | Payer: 59 | Source: Ambulatory Visit | Attending: Sports Medicine | Admitting: Sports Medicine

## 2014-04-03 DIAGNOSIS — M25469 Effusion, unspecified knee: Secondary | ICD-10-CM | POA: Insufficient documentation

## 2014-04-03 DIAGNOSIS — M7120 Synovial cyst of popliteal space [Baker], unspecified knee: Secondary | ICD-10-CM | POA: Insufficient documentation

## 2014-04-03 DIAGNOSIS — M1710 Unilateral primary osteoarthritis, unspecified knee: Secondary | ICD-10-CM | POA: Insufficient documentation

## 2014-04-03 DIAGNOSIS — M25561 Pain in right knee: Secondary | ICD-10-CM

## 2014-04-03 DIAGNOSIS — IMO0002 Reserved for concepts with insufficient information to code with codable children: Secondary | ICD-10-CM | POA: Insufficient documentation

## 2014-04-04 ENCOUNTER — Encounter: Payer: Self-pay | Admitting: Sports Medicine

## 2014-04-04 ENCOUNTER — Ambulatory Visit (INDEPENDENT_AMBULATORY_CARE_PROVIDER_SITE_OTHER): Payer: 59 | Admitting: Sports Medicine

## 2014-04-04 VITALS — BP 143/87 | Ht 72.0 in | Wt 235.0 lb

## 2014-04-04 DIAGNOSIS — M1120 Other chondrocalcinosis, unspecified site: Secondary | ICD-10-CM

## 2014-04-04 DIAGNOSIS — M1180 Other specified crystal arthropathies, unspecified site: Secondary | ICD-10-CM

## 2014-04-04 MED ORDER — COLCHICINE 0.6 MG PO TABS: 0.6000 mg | ORAL_TABLET | Freq: Two times a day (BID) | ORAL | Status: DC

## 2014-04-04 NOTE — Progress Notes (Signed)
Patient ID: Marc Aguilar, male   DOB: [birth year], 53 y.o.   MRN: [id number] 53 year old male presents for followup of bilateral recurrent knee effusions. Status left knee aspirated once his right knee aspirated twice over the past one month. Corticosteroid injections were given in each aspiration. He continues to have recurrent swelling. Had an MRI done yesterday for this. He denies any significant amount of pain. Does have a lot of stiffness associated with the swelling however. No recent injuries. No other arthralgias noted. No fevers or chills. Has been taking oral ibuprofen 800 mg 3 times a day for relief of symptoms. The ibuprofen has not limited swelling. He's been wearing compressive sleeve to his knees.  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 Former smoker, occasional alcohol  Review of systems: As per history of present illness otherwise all systems negative  Examination: BP 143/87  Ht 6' (1.829 m)  Wt 235 lb (106.595 kg)  BMI 31.86 kg/m2 Well-developed well-nourished 53 year old male awake alert and oriented in no acute distress Bilateral Knees: Positive effusions bilaterally, right greater than left. Palpation normal with no warmth, joint line tenderness, patellar tenderness, or condyle tenderness. ROM with mildly limited flexion of right knee secondary to effusion. Ligaments with solid consistent endpoints including ACL, PCL, LCL, MCL. Equivocal  Mcmurray's Non painful patellar compression. Patellar glide with crepitus. Patellar and quadriceps tendons unremarkable. Hamstring and quadriceps strength is normal.   MRI: We reviewed the results of his MRI with him in the office today. This showed degenerative tearing of both medial lateral meniscus as well as tricompartmental arthritis. In addition there is an effusion noted.  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

## 2014-04-04 NOTE — Patient Instructions (Signed)
Follow-up in 6 weeks

## 2014-04-04 NOTE — Assessment & Plan Note (Addendum)
We reviewed his diagnosis today. He started on colchicine 0.6 mg twice a day. He'll take this next 6-8 weeks. He'll continue to use knee compression. Followup in 3 months time for reassessment of need of corticosteroid injection. Return sooner if needed.  Given his history of diabetes oral steroids and/or long-term NSAIDs have significant risk the we elected to use colchicine instead.

## 2014-04-26 ENCOUNTER — Telehealth: Payer: Self-pay | Admitting: *Deleted

## 2014-04-26 NOTE — Telephone Encounter (Signed)
**  DIABETIC BUNDLE**  Left voicemail requesting pt to call office and schedule a fasting lab appt in Sep to recheck A1c and Lipid profile

## 2014-05-14 ENCOUNTER — Ambulatory Visit: Payer: 59 | Admitting: Sports Medicine

## 2014-06-11 ENCOUNTER — Other Ambulatory Visit: Payer: Self-pay

## 2014-06-11 MED ORDER — GLUCOSE BLOOD VI STRP: ORAL_STRIP | Status: DC

## 2014-06-11 MED ORDER — LANCETS ULTRA FINE MISC: Status: DC

## 2014-06-11 MED ORDER — ALCOHOL PREPS PADS: MEDICATED_PAD | Status: DC

## 2014-06-11 NOTE — Telephone Encounter (Signed)
Pt left note requesting refill on test strips, alcohol swabs and lancets at [facility] out[facility]am[facility].advised pt done.

## 2014-07-29 ENCOUNTER — Encounter: Payer: Self-pay | Admitting: Internal Medicine

## 2014-07-29 ENCOUNTER — Ambulatory Visit (INDEPENDENT_AMBULATORY_CARE_PROVIDER_SITE_OTHER): Payer: 59 | Admitting: Internal Medicine

## 2014-07-29 VITALS — BP 128/84 | HR 85 | Temp 98.6°F | Wt 235.0 lb

## 2014-07-29 DIAGNOSIS — Z23 Encounter for immunization: Secondary | ICD-10-CM

## 2014-07-29 DIAGNOSIS — E119 Type 2 diabetes mellitus without complications: Secondary | ICD-10-CM

## 2014-07-29 MED ORDER — COLCHICINE 0.6 MG PO TABS: 0.6000 mg | ORAL_TABLET | Freq: Two times a day (BID) | ORAL | Status: DC

## 2014-07-29 NOTE — Assessment & Plan Note (Addendum)
Checking sugars BID A1C 5.0 05/16/14 No medication adjustment today Continue to follow with [facility] Pneumovax today

## 2014-07-29 NOTE — Patient Instructions (Addendum)
How to Avoid Diabetes Problems You can do a lot to prevent or slow down diabetes problems. Following your diabetes plan and taking care of yourself can reduce your risk of serious or life-threatening complications. Below, you will find certain things you can do to prevent diabetes problems. MANAGE YOUR DIABETES Follow your health care provider's, nurse educator's, and dietitian's instructions for managing your diabetes. They will teach you the basics of diabetes care. They can help answer questions you may have. Learn about diabetes and make healthy choices regarding eating and physical activity. Monitor your blood glucose level regularly. Your health care provider will help you decide how often to check your blood glucose level depending on your treatment goals and how well you are meeting them.  DO NOT USE NICOTINE Nicotine and diabetes are a dangerous combination. Nicotine raises your risk for diabetes problems. If you quit using nicotine, you will lower your risk for heart attack, stroke, nerve disease, and kidney disease. Your cholesterol and your blood pressure levels may improve. Your blood circulation will also improve. Do not use any tobacco products, including cigarettes, chewing tobacco, or electronic cigarettes. If you need help quitting, ask your health care provider. KEEP YOUR BLOOD PRESSURE UNDER CONTROL Keeping your blood pressure under control will help prevent damage to your eyes, kidneys, heart, and blood vessels. Blood pressure consists of two numbers. The top number should be below 120, and the bottom number should be below 80 (120/80). Keep your blood pressure as close to these numbers as you can. If you already have kidney disease, you may want even lower blood pressure to protect your kidneys. Talk to your health care provider to make sure that your blood pressure goal is right for your needs. Meal planning, medicines, and exercise can help you reach your blood pressure target. Have  your blood pressure checked at every visit with your health care provider. KEEP YOUR CHOLESTEROL UNDER CONTROL Normal cholesterol levels will help prevent heart disease and stroke. These are the biggest health problems for people with diabetes. Keeping cholesterol levels under control can also help with blood flow. Have your cholesterol level checked at least once a year. Your health care provider may prescribe a medicine known as a statin. Statins lower your cholesterol. If you are not taking a statin, ask your health care provider if you should be. Meal planning, exercise, and medicines can help you reach your cholesterol targets.  SCHEDULE AND KEEP YOUR ANNUAL PHYSICAL EXAMS AND EYE EXAMS Your health care provider will tell you how often he or she wants to see you depending on your plan of treatment. It is important that you keep these appointments so that possible problems can be identified early and complications can be avoided or treated.  Every visit with your health care provider should include your weight, blood pressure, and an evaluation of your blood glucose control.  Your hemoglobin A1c should be checked:  At least twice a year if you are at your goal.  Every 3 months if there are changes in treatment.  If you are not meeting your goals.  Your blood lipids should be checked yearly. You should also be checked yearly to see if you have protein in your urine (microalbumin).  Schedule a dilated eye exam within 5 years of your diagnosis if you have type 1 diabetes, and then yearly. Schedule a dilated eye exam at diagnosis if you have type 2 diabetes, and then yearly. All exams thereafter can be extended to every 2  to 3 years if one or more exams have been normal. KEEP YOUR VACCINES CURRENT The flu vaccine is recommended yearly. The formula for the vaccine changes every year and needs to be updated for the best protection against current viruses. It is recommended that people with diabetes  who are over 51 years old get the pneumonia vaccine. In some cases, two separate shots may be given. Ask your health care provider if your pneumonia vaccination is up-to-date. However, there are some instances where another vaccine is recommended. Check with your health care provider. TAKE CARE OF YOUR FEET  Diabetes may cause you to have a poor blood supply (circulation) to your legs and feet. Because of this, the skin may be thinner, break easier, and heal more slowly. You also may have nerve damage in your legs and feet, causing decreased feeling. You may not notice minor injuries to your feet that could lead to serious problems or infections. Taking care of your feet is very important. Visual foot exams are performed at every routine medical visit. The exams check for cuts, injuries, or other problems with the feet. A comprehensive foot exam should be done yearly. This includes visual inspection as well as assessing foot pulses and testing for loss of sensation. You should also do the following:  Inspect your feet daily for cuts, calluses, blisters, ingrown toenails, and signs of infection, such as redness, swelling, or pus.  Wash and dry your feet thoroughly, especially between the toes.  Avoid soaking your feet regularly in hot water baths.  Moisturize dry skin with lotion, avoiding areas between your toes.  Cut toenails straight across and file the edges.  Avoid shoes that do not fit well or have areas that irritate your skin.  Avoid going barefooted or wearing only socks. Your feet need protection. TAKE CARE OF YOUR TEETH People with poorly controlled diabetes are more likely to have gum (periodontal) disease. These infections make diabetes harder to control. Periodontal diseases, if left untreated, can lead to tooth loss. Brush your teeth twice a day, floss, and see your dentist for checkups and cleaning every 6 months, or 2 times a year. ASK YOUR HEALTH CARE PROVIDER ABOUT TAKING  ASPIRIN Taking aspirin daily is recommended to help prevent cardiovascular disease in people with and without diabetes. Ask your health care provider if this would benefit you and what dose he or she would recommend. DRINK RESPONSIBLY Moderate amounts of alcohol (less than 1 drink per day for adult women and less than 2 drinks per day for adult men) have a minimal effect on blood glucose if ingested with food. It is important to eat food with alcohol to avoid hypoglycemia. People should avoid alcohol if they have a history of alcohol abuse or dependence, if they are pregnant, and if they have liver disease, pancreatitis, advanced neuropathy, or severe hypertriglyceridemia. LESSEN STRESS Living with diabetes can be stressful. When you are under stress, your blood glucose may be affected in two ways:  Stress hormones may cause your blood glucose to rise.  You may be distracted from taking good care of yourself. It is a good idea to be aware of your stress level and make changes that are necessary to help you better manage challenging situations. Support groups, planned relaxation, a hobby you enjoy, meditation, healthy relationships, and exercise all work to lower your stress level. If your efforts do not seem to be helping, get help from your health care provider or a trained mental health professional. Document  Released: 06/22/2011 Document Revised: 02/18/2014 Document Reviewed: 11/28/2013 [facility] Patient Information 2015 [place], [place]. This information is not intended to replace advice given to you by your health care provider. Make sure you discuss any questions you have with your health care provider.

## 2014-07-29 NOTE — Progress Notes (Signed)
Subjective:    Patient ID: Marc Aguilar, male    DOB: November 16, 1960, 53 y.o.   MRN: [id number]  HPI  Pt presents to the clinic today for 6 month follow up of DM2. His last A1C was 6.8 %. He is on Metformin 1000 mg BID. He is checking his sugars twice daily and reports that they have been within normal range. He denies hypoglycemic events. His weight has remained steady. He did have an A1C of 5.0 on 05/16/14. He is being seen at the [place] [facility]. They are proviiding him with his mediction and lancets free of charge. They are also checking his A1C every 3 months. He has had his yearly eye exam. He has had his flu shot 06/2014. He has not had his pneumonia shot.  Review of Systems      Past Medical History  Diagnosis Date  . GERD (gastroesophageal reflux disease)   . Heart murmur   . Chicken pox   . Diabetes mellitus without complication   . Allergy   . Sleep apnea     does not use cpap    Current Outpatient Prescriptions  Medication Sig Dispense Refill  . Alcohol Swabs (ALCOHOL PREPS) PADS Check blood sugar twice a day and as directed. Dx 250.00  100 each  3  . colchicine 0.6 MG tablet Take 1 tablet (0.6 mg total) by mouth 2 (two) times daily.  60 tablet  3  . glucose blood (ONETOUCH VERIO) test strip Check blood sugar twice a day and as directed. Dx 250.00  100 each  3  . ibuprofen (ADVIL,MOTRIN) 800 MG tablet Take 1 tablet (800 mg total) by mouth every 8 (eight) hours as needed.  60 tablet  1  . LANCETS ULTRA FINE MISC Check blood sugar twice a day and as directed. Dx 250.00  100 each  3  . metFORMIN (GLUCOPHAGE) 500 MG tablet Take 2 tablets (1,000 mg total) by mouth 2 (two) times daily with a meal.  120 tablet  5  . methylcellulose (FIBER THERAPY) oral powder Take 1 packet by mouth 2 (two) times daily as needed (constipation).       . Omega 3 1000 MG CAPS Take by mouth.       No current facility-administered medications for this visit.    No Known  Allergies  Family History  Problem Relation Age of Onset  . Diabetes Mother   . Hyperlipidemia Mother   . Cancer Father   . Cancer Sister   . Diabetes Sister   . Diabetes Maternal Aunt   . Arthritis Maternal Grandmother   . Diabetes Maternal Grandmother   . Arthritis Maternal Grandfather   . Arthritis Paternal Grandmother   . Colon cancer Neg Hx   . Diabetes Brother   . Heart disease Brother     History   Social History  . Marital Status: Married    Spouse Name: N/A    Number of Children: N/A  . Years of Education: N/A   Occupational History  . Not on file.   Social History Main Topics  . Smoking status: Former Smoker    Quit date: 10/18/2009  . Smokeless tobacco: Former Systems developer    Types: Chew    Quit date: 10/18/1994  . Alcohol Use: 3.0 - 3.6 oz/week    1-2 Glasses of wine, 3 Cans of beer, 1 Shots of liquor per week  . Drug Use: No  . Sexual Activity: Yes   Other Topics Concern  .  Not on file   Social History Narrative  . No narrative on file     Constitutional: Denies fever, malaise, fatigue, headache or abrupt weight changes.  Respiratory: Denies difficulty breathing, shortness of breath, cough or sputum production.   Cardiovascular: Denies chest pain, chest tightness, palpitations or swelling in the hands or feet.  Gastrointestinal: Denies abdominal pain, bloating, constipation, diarrhea or blood in the stool.  Skin: Denies redness, rashes, lesions or ulcercations.    No other specific complaints in a complete review of systems (except as listed in HPI above).  Objective:   Physical Exam   BP 128/84  Pulse 85  Temp(Src) 98.6 F (37 C) (Oral)  Wt 235 lb (106.595 kg)  SpO2 98% Wt Readings from Last 3 Encounters:  07/29/14 235 lb (106.595 kg)  04/04/14 235 lb (106.595 kg)  03/25/14 238 lb (107.956 kg)    General: Appears his stated age, well developed, well nourished in NAD. Skin: Warm, dry and intact. No rashes, lesions or ulcerations  noted. Cardiovascular: Normal rate and rhythm. S1,S2 noted.  No murmur, rubs or gallops noted.  Pulmonary/Chest: Normal effort and positive vesicular breath sounds. No respiratory distress. No wheezes, rales or ronchi noted.  Abdomen: Soft and nontender. Normal bowel sounds, no bruits noted. No distention or masses noted. Liver, spleen and kidneys non palpable.  BMET    Component Value Date/Time   NA 139 10/25/2013 1602   K 4.4 10/25/2013 1602   CL 105 10/25/2013 1602   CO2 28 10/25/2013 1602   GLUCOSE 84 10/25/2013 1602   BUN 19 10/25/2013 1602   CREATININE 1.3 10/25/2013 1602   CALCIUM 9.5 10/25/2013 1602    Lipid Panel     Component Value Date/Time   CHOL 204* 10/25/2013 1602   TRIG 125.0 10/25/2013 1602   HDL 34.20* 10/25/2013 1602   CHOLHDL 6 10/25/2013 1602   VLDL 25.0 10/25/2013 1602    CBC    Component Value Date/Time   WBC 6.4 10/25/2013 1602   RBC 5.87* 10/25/2013 1602   HGB 15.1 10/25/2013 1602   HCT 45.9 10/25/2013 1602   PLT 238.0 10/25/2013 1602   MCV 78.3 10/25/2013 1602   MCHC 33.0 10/25/2013 1602   RDW 15.4* 10/25/2013 1602    Hgb A1C Lab Results  Component Value Date   HGBA1C 6.8* 01/21/2014        Assessment & Plan:

## 2014-07-29 NOTE — Addendum Note (Signed)
Addended by: Lurlean Nanny on: 07/29/2014 01:42 PM   Modules accepted: Orders

## 2014-07-29 NOTE — Progress Notes (Signed)
Pre visit review using our clinic review tool, if applicable. No additional management support is needed unless otherwise documented below in the visit note. 

## 2014-07-30 ENCOUNTER — Ambulatory Visit: Payer: 59 | Admitting: Internal Medicine

## 2014-08-01 ENCOUNTER — Ambulatory Visit: Payer: 59 | Admitting: Internal Medicine

## 2014-09-06 ENCOUNTER — Encounter: Payer: Self-pay | Admitting: Internal Medicine

## 2014-09-06 ENCOUNTER — Other Ambulatory Visit: Payer: Self-pay | Admitting: Sports Medicine

## 2014-09-06 ENCOUNTER — Other Ambulatory Visit: Payer: Self-pay | Admitting: Internal Medicine

## 2014-09-06 DIAGNOSIS — E119 Type 2 diabetes mellitus without complications: Secondary | ICD-10-CM

## 2014-09-06 MED ORDER — METFORMIN HCL 500 MG PO TABS: 1000.0000 mg | ORAL_TABLET | Freq: Two times a day (BID) | ORAL | Status: DC

## 2014-10-16 ENCOUNTER — Other Ambulatory Visit: Payer: Self-pay

## 2014-10-16 MED ORDER — GLUCOSE BLOOD VI STRP: ORAL_STRIP | Status: DC

## 2014-10-16 NOTE — Telephone Encounter (Signed)
Pt request refill onetouch verio test strips to [facility]. Advised pt done.

## 2014-11-12 ENCOUNTER — Ambulatory Visit (INDEPENDENT_AMBULATORY_CARE_PROVIDER_SITE_OTHER): Payer: 59 | Admitting: Sports Medicine

## 2014-11-12 ENCOUNTER — Encounter: Payer: Self-pay | Admitting: Sports Medicine

## 2014-11-12 VITALS — BP 125/70 | HR 85 | Ht 72.0 in | Wt 224.0 lb

## 2014-11-12 DIAGNOSIS — M1180 Other specified crystal arthropathies, unspecified site: Secondary | ICD-10-CM

## 2014-11-12 NOTE — Patient Instructions (Addendum)
It was a pleasure to see you in clinic today.   - We are going to stop the Colchicine for now, you may restart if you have a flare with worsening of swelling  - Use compression sleeves regularly when standing a lot  - Ibuprofen 800mg  twice a day is ok, but make sure you continue to monitor your kidney function with your primary care doctor  - Do your best to start biking or doing stationary biking regularly, the more the better to help strengthen the muscles surrounding the knee  - Also try to perform daily isometric exercises, including raising your leg from your side, as well as leg rises  - Avoid activities that make the pain worse, especially activities such as squating. Limit knee flexion to 45 degrees. Also do not maximize your weight with weight-lifting. You also will likely need 48 hrs rest in between sessions.   - Continue doing well with your diabetes control, as this will reduce the likelihood of crystal deposition within your knees. Also encourage weight loss with diet and aerobic activity. This will further help with diabetes management and your knee pain.   - We will see you back in 3-4 months.   Thanks so much.

## 2014-11-12 NOTE — Assessment & Plan Note (Signed)
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

## 2014-11-12 NOTE — Progress Notes (Signed)
Subjective:    Patient ID: Marc Aguilar, male    DOB: [birth year], 54 y.o.   MRN: [id number]  HPI Marc Aguilar is a 54 yo male who presents for follow-up of bilateral knee pain and swelling. He was last seen in the [facility] in June of 2015. Initial radiographic imaging of his knees was unremarkable. 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 He also had a joint aspiration of his right knee performed which was consistent with CPPD crystal deposition. He received a steroid injection in his knee and was encouraged to wear a compression sleeve with activity, and was also started on Colchicine 0.6mg  BID.   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 He continues to have bilateral knee pain that is described as feeling like a "pulling" sensation and "bone on bone", daily, with worsening of pain on days with more activity. The pain is described as being located along the medial joint line and the lateral joint line bilaterally, worse along the medial. The swelling has improved bilaterally. He has been wearing compression sleeves with activity, which he attributes to the improvement in swelling. He has been taking the Colchicine up until about 3 weeks ago, and did not notice any improvement.   The pain seems to be improved with 800mg  ibuprofen BID as well as taking daily vitamins. He has tried Meloxicam in the past without improvement. Although his history is remarkable for T2DM, his most recent HgbA1c was 6.8 and his diabetes is being managed with metformin. He has also lost weight. He reports some numbness of his right thigh, but no radiation of the pain below the knee. No back pain.   Review of  Systems Negative other than noted in HPI.      Objective:   Physical Exam BP 125/70 mmHg  Pulse 85  Ht 6' (1.829 m)  Wt 224 lb (101.606 kg)  BMI 30.37 kg/m2 General: well-appearing, pleasant male in no acute distress  Knee Examination:  Inspection: trace suprapatellar swelling along the lateral border bilaterally, no discoloration or asymmetry Palpation: mild tenderness to palpation along the medial joint line of the right knee; no tenderness to palpation along patella, patellar tendon, quadriceps tendon, tibial tuberosity, fibular head, or lateral joint line bilaterally ROM: Flexion to 140 degrees and extension to 0 degrees bilaterally Strength: 5/5 with flexion and extension of knee, as well as hip abduction and adduction Special testing: + Thessaly's, MCL and LCL intact bilaterally Neurovascular: neurovascularly intact distally     Assessment & Plan:   1. Bilateral Knee Pain:  Mr. Sayegh bilateral knee pain and swelling are likely multifactorial in nature. Given his previous MRI findings, there are certainly degenerative changes that likely contribute to his chronic pain and swelling. The previous joint aspiration also supported the presence of CPPD crystal deposition within the joint. The improvement in the swelling since the previous visit coupled with the improvement in glycemic control and weight loss have likely lessened the CPPD deposition, and his symptoms are now more due the degenerative changes.    - Given the lack of improvement in symptoms with Colchicine 0.6mg  BID, will discontinue this for now   - Encouraged to wear compression sleeves  on knees regularly, especially on days with prolonged standing or increased activity   - Marc Aguilar has been using the 800mg  ibuprofen BID daily for some time now without difficulty. Given the improvements in his diabetes management, weight loss with goal to continue losing weight, and kidney function (Cr 1.3, GFR 77) this seems ok for now  , but continue to monitor his renal function   - Encouraged biking for exercise to help strengthen quadriceps and hip abductors, as well as work towards his weight loss goal   - Discussed some isometric exercises to perform, including leg raises and hip abductors   - Regarding his current work out routine, discussed the importance of limiting knee flexion to no more than 45 degrees, to limit total weight during weight-lifting, and allowing more time to recover between sessions (48hrs).    - Encouraged to continue doing well with his diabetes management, and to continue exercising and consider making dietary changes to continue weight loss, with goal of less than 200 pounds. This will help to limit the CPPD crystal deposition within the knee joints. Dietary  recommendations include increasing fruits and vegetables. Also provided with information on the DASH diet.     Follow-up in in 3-4 months, or sooner if needed.     This patient was seen with and note was dictated by Crissie Sickles, MS4.

## 2014-12-22 ENCOUNTER — Encounter: Payer: Self-pay | Admitting: Internal Medicine

## 2014-12-22 DIAGNOSIS — E119 Type 2 diabetes mellitus without complications: Secondary | ICD-10-CM

## 2014-12-23 ENCOUNTER — Other Ambulatory Visit: Payer: Self-pay

## 2014-12-23 ENCOUNTER — Ambulatory Visit (INDEPENDENT_AMBULATORY_CARE_PROVIDER_SITE_OTHER): Payer: Self-pay | Admitting: Family Medicine

## 2014-12-23 ENCOUNTER — Other Ambulatory Visit: Payer: Self-pay | Admitting: Internal Medicine

## 2014-12-23 VITALS — BP 116/80 | Wt 234.0 lb

## 2014-12-23 DIAGNOSIS — E119 Type 2 diabetes mellitus without complications: Secondary | ICD-10-CM

## 2014-12-23 MED ORDER — METFORMIN HCL 500 MG PO TABS: 1000.0000 mg | ORAL_TABLET | Freq: Two times a day (BID) | ORAL | Status: DC

## 2014-12-23 MED ORDER — IBUPROFEN 800 MG PO TABS
ORAL_TABLET | ORAL | Status: DC
Start: 2014-12-23 — End: 2017-11-10

## 2014-12-23 MED ORDER — GLUCOSE BLOOD VI STRP: 1.0000 | ORAL_STRIP | Freq: Two times a day (BID) | Status: DC

## 2014-12-23 MED ORDER — TRUEPLUS LANCETS 30G MISC: 1.0000 | Freq: Two times a day (BID) | Status: DC

## 2014-12-23 NOTE — Assessment & Plan Note (Signed)
Subjective:  Patient presents today for an annual pharmacy visit and 3 month diabetes follow-up as part of the employer-sponsored Link to Wellness program. Current diabetes regimen include metformin 1000 mg BID. Patient also continues on daily ASA.   Last appointment with Webb Silversmith was in October. He requested that we check A1C today. No medication changes or healthy history changes.   Advanced Care Planning: Date Discussed 02/07/2014; Code Status FULL CODE; No Living Will in place; No Advanced Directive; Information was given Forms given to member    Disease Assessments:  Diabetes: Type of Diabetes: Type 2; Year of diagnosis 2015 new DX; Sees Diabetes provider 3 times per year; MD managing Diabetes Webb Silversmith NP; Diabetes Education 11/24/13 Com[facility]; checks feet daily; takes medications as prescribed; takes an aspirin a day; What is target A1c? 7.0 %; What are the signs and symptoms of hypoglycemia? Lightheaded, Shaky, Hunger; hypoglycemia frequency none; uses glucometer One-Touch Verio IQ- also has the True Result Strips;   7 day CBG average 125; 14 day CBG average 125; 30 day CBG average 151; Highest CBG 217; Lowest CBG 90; checks blood glucose less than 1 time a week;   Other Diabetes History:  I will contact [facility] office for testing supplies. He has not been checking with this meter as often since he was running out of test strips. He states he is checking twice daily, yet he only has 5 CBG checks in the last 30 days. Denies hypoglycemia. A1C today was 4.9%.   Past Medical/Surgical History:  The patient has a past medical history of Diabetes Mellitus type 2, Osteoarthritis (OA).   Hospitalizations: Denies Hospital visits in the past year; ED visits in the past year The patient had 1 ED visits in the past yearPain and swelling in Lt leg. R/O thrombosis 3/15  Prior Surgery: cyst removed lt wrist 1985   Tobacco Assessment:  Smoking Status: Former smoker; Last Reviewed: 12/23/2014  Pack-years: 20; smoked for 15-20 years; Date quit smoking: 2010   Social History:  Alcohol use: 2 - 3 drinks per week; Beer, Wine, Hard liquor, one drink; No drug use; Caffeine use: coffee 2 per day Dailydiet coke, tea; Patient can afford medications; Patient knows the purpose/use of medications; Medication adherence adherent; Exercise adherence 3-4 days a week; 360 minutes of exercise per week; Diet adherence Greater than 75% of the time Trying to count CHO and eat more whole grains. He is taking lunch and eating salads. States he avoiding fried foods..  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 He has also started weight lifting. He has used the gym at work also. He states he is trying to get more active and was exercising twice a day. He is aiming for three times a week. He is spending about an hour three times a week.  Nutrition- He states that he has cut back on eating fast food. He is making his own sandwiches (ham, thinly sliced bread), incorporating more salads and trying to cook more. He is not counting carbohydrates right now but states he isn't eating very many carbohydrates in his diet.  1st meal of day- 3 minute eggs, no toast, sometimes oatmeal. Sometimes coffee, sometimes diet coke, sometimes juice, but not that often.  2nd meal- usually  nothing because he is asleep (he works third shift) 3rd meal- sometimes to [employer] or [employer] (he has an hour commute, works third shift at [employer]). When he does eat fast food he gets a burger. Sometimes he will get chili and baked potato from [facility]. Most of the time he skips the [place] fries. 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 Depends on what  he was eating the day before. Sometimes will also eat a sandwich.  He states that he thinks he could improve with counting carbohydrates. He also thinks that he could do better if he brought his lunch to work.    Preventive Care:  Last Complete Physical: 07/29/2014  Hemoglobin A1c: 12/23/2014 via POCT 4.9    Colonoscopy: 03/25/2014  Dilated Eye Exam: 12/07/2013  Flu vaccine: 07/18/2014  Foot Exam: 07/29/2014  Lipid Panel: 10/25/2013  Pneumovax: 07/29/2014  Prostate Exam 10/25/2013  Other Preventive Care Notes:  Dental Visit- nothing scheduled           Vital Signs:  12/23/2014 11:55 AM (EST)Blood Pressure 116 / 80 mm/HgBMI 31.7; Height 6 ft 0 in; Weight 234 lbs   Testing:  Blood Sugar Tests: Hemoglobin A1c: 4.9 via POCT resulted on 12/23/2014   Care Planning:  Learning Preference Assessment:  Learner: Patient  Readiness to Learn Barriers: None  Teaching Method: Demonstration, Explanation, Handout, Video  Evaluation of Learning: Can function independently and verbalize knowledge  Readiness to Change:  How important is your health to you? 10  How confident are you in working to improve your health? 10  How ready are you to change to improve your health? 10  Total Score: 10  Care Plan:  12/23/2014 11:55 AM (EST) (1)  Problem: Potential for elevated blood sugars  Role: Care Management Coordinator  Long Term Goal Member will maintain HgbA1C at or below 7.0 for the next 90 days Interventions:  Reviewed CHO counting and portion control. Praised for eating more vegetables and watching portions sizes  Instructed on importance of exercise on glycemic control and to try to do weight training twice a week along with his walking and exercising  Given lab slip to get HgbA1C drawn at [facility]. Instructed him that [facility] will call him with results.  Issued True Result glucometer and instructed to have MD rewrite Rx for True Result strips and supplies Date Started: 10/16/2014   Goal Met A1C today was 4.9%.  Short Term Goal #1: Member will complete EMMI programs by 11/16/14 as evidenced by EMMI completion record Interventions:  Instructed on how to assess EMMI programs Date Started: 10/16/2014  Emmi: Assigned programs on Diabetic foot care and Diabetic eye care     Assessment/Plan: Patient is a 54 year old male with DM2. A1C today was 4.9% and is well below goal of less than 7%. Patient is taking metformin 1000 mg BID. He has increased physical activity lately and reports he is getting >300 minutes physical activity each week. He denies hypoglycemia. Weight is up 4 pounds since his last appointment with Melissa.  I encouraged patient to continue physical activity. I showed patient how to log in his workouts into the [employer] website and how to claim his wellness rewards.  I will send A1C results to Webb Silversmith, [place] and will suggest that metformin dose be reduced given most recent A1C result. Patient will follow up with [name], RNCM in 3 months.  Goals for Next Visit- 1. Schedule an eye exam before your next visit with Melissa. 2.  Make an appointment with a dentist for an exam and cleaning. To find a dentist, [email or web address] 3. Continue physical activity. Melissa will contact you for your next appointment with her.

## 2015-01-21 NOTE — Progress Notes (Signed)
ATTENDING PHYSICIAN NOTE: I have reviewed the chart and agree with the plan as detailed above. Sara Neal MD Pager [phone]  

## 2015-02-01 ENCOUNTER — Encounter: Payer: Self-pay | Admitting: Internal Medicine

## 2015-02-03 ENCOUNTER — Other Ambulatory Visit: Payer: Self-pay | Admitting: Internal Medicine

## 2015-03-13 ENCOUNTER — Encounter: Payer: Self-pay | Admitting: Internal Medicine

## 2015-03-13 ENCOUNTER — Ambulatory Visit (INDEPENDENT_AMBULATORY_CARE_PROVIDER_SITE_OTHER): Payer: 59 | Admitting: Internal Medicine

## 2015-03-13 ENCOUNTER — Telehealth: Payer: Self-pay | Admitting: Internal Medicine

## 2015-03-13 VITALS — BP 122/74 | HR 72 | Temp 98.7°F | Wt 229.0 lb

## 2015-03-13 DIAGNOSIS — G4733 Obstructive sleep apnea (adult) (pediatric): Secondary | ICD-10-CM | POA: Diagnosis not present

## 2015-03-13 DIAGNOSIS — R0789 Other chest pain: Secondary | ICD-10-CM | POA: Diagnosis not present

## 2015-03-13 DIAGNOSIS — E119 Type 2 diabetes mellitus without complications: Secondary | ICD-10-CM

## 2015-03-13 DIAGNOSIS — J302 Other seasonal allergic rhinitis: Secondary | ICD-10-CM

## 2015-03-13 DIAGNOSIS — K219 Gastro-esophageal reflux disease without esophagitis: Secondary | ICD-10-CM

## 2015-03-13 MED ORDER — METFORMIN HCL 500 MG PO TABS: 1000.0000 mg | ORAL_TABLET | Freq: Two times a day (BID) | ORAL | Status: DC

## 2015-03-13 MED ORDER — GLUCOSE BLOOD VI STRP: 1.0000 | ORAL_STRIP | Freq: Two times a day (BID) | Status: DC

## 2015-03-13 MED ORDER — METHOCARBAMOL 500 MG PO TABS: 500.0000 mg | ORAL_TABLET | Freq: Two times a day (BID) | ORAL | Status: DC | PRN

## 2015-03-13 MED ORDER — METFORMIN HCL 1000 MG PO TABS: 1000.0000 mg | ORAL_TABLET | Freq: Two times a day (BID) | ORAL | Status: DC

## 2015-03-13 MED ORDER — TRUEPLUS LANCETS 30G MISC: 1.0000 | Freq: Two times a day (BID) | Status: DC

## 2015-03-13 NOTE — Assessment & Plan Note (Signed)
Sleeping well of his CPAP He will let me know if this gets worse

## 2015-03-13 NOTE — Telephone Encounter (Signed)
I sent him in Robaxin

## 2015-03-13 NOTE — Addendum Note (Signed)
Addended by: Lurlean Nanny on: 03/13/2015 03:20 PM   Modules accepted: Orders, Medications

## 2015-03-13 NOTE — Assessment & Plan Note (Signed)
No issues Will continue to monitor

## 2015-03-13 NOTE — Assessment & Plan Note (Signed)
Will repeat A1C today Lipid profile just done, LDL at goal Flu and pneumovax UTD Foot exam today Eye exam past due- advised him to make an eye appt ASAP

## 2015-03-13 NOTE — Addendum Note (Signed)
Addended by: [name] on: 03/13/2015 02:11 PM   Modules accepted: Orders

## 2015-03-13 NOTE — Addendum Note (Signed)
Addended by: Royann Shivers A on: 03/13/2015 01:56 PM   Modules accepted: Orders

## 2015-03-13 NOTE — Telephone Encounter (Signed)
Pt would like to know if a muscle relaxer would help since ibprofen is not helping.

## 2015-03-13 NOTE — Progress Notes (Addendum)
Subjective:    Patient ID: Marc Aguilar, male    DOB: May 13, 1961, 54 y.o.   MRN: [id number]  HPI  Pt presents to the clinic today for hospital follow up for chest pain. He went to the EMS station with an acute onset of chest pain. ECG was unremarkable but pain improved with administration of Nitro. He was then taken to [facility] via EMS. They did admit him for overnight monitoring. Chest xray and ECG were normal. Labs did show a slightly elevated potassium. Cardiac enzymes were negative. The cardiologist obtained a stress test which was normal. He had no ischemia with an EF of 56%. The cardiologist felt like this was not a hear issue and may be r/t esophageal spasms. He was discharged with instruction to follow up with PCP in 1 week. He reports that he continues to have pain his right upper chest that radiates into his right shoulder blade. The pain is constant. He reports that it is sharp and stabbing. The pain is better when he lays down. Driving/moving in certain ways makes the pain worse. He denies shortness of breath. He feels like it may be muscular but is concerned that it is something else because Advil did not relief any of his pain.  He is also due for 6 month follow up of chronic conditions.   DM2: He is taking Metformin daily. His last A1C was 4.9% 04/2014. He does test his sugars at home, they range 78-140. He has not had any hypoglycemia. Flu 07/2014. Pneumovax 07/2014. His last eye exam was 12/2013. His most recent LDL was 110, Triglycerides 143, drawn 5/20 at [facility].  GERD: Asymptomatic at this time  Seasonal Allergies: Asymptomatic at this time  OSA: He reports he is getting 7-8 hours of sleep. He still does not use his CPAP machine. He continues to snore.  Review of Systems      Past Medical History  Diagnosis Date  . GERD (gastroesophageal reflux disease)   . Heart murmur   . Chicken pox   . Diabetes mellitus without complication   . Allergy   . Sleep apnea    does not use cpap    Current Outpatient Prescriptions  Medication Sig Dispense Refill  . colchicine 0.6 MG tablet Take 1 tablet (0.6 mg total) by mouth 2 (two) times daily. (Patient not taking: Reported on 12/23/2014) 60 tablet 3  . glucose blood (TRUETEST TEST) test strip 1 each by Other route 2 (two) times daily. Use as instructed 100 each 6  . ibuprofen (ADVIL,MOTRIN) 800 MG tablet TAKE 1 TABLET (800 MG) BY MOUTH EVERY 8 HOURS AS NEEDED. 60 tablet 1  . metFORMIN (GLUCOPHAGE) 500 MG tablet TAKE 2 TABLETS BY MOUTH TWICE DAILY WITH MEALS 60 tablet 1  . methylcellulose (FIBER THERAPY) oral powder Take 1 packet by mouth 2 (two) times daily as needed (constipation).     . Omega 3 1000 MG CAPS Take by mouth.    . TRUEPLUS LANCETS 30G MISC 1 each by Does not apply route 2 (two) times daily. Dx E11.9 100 each 6   No current facility-administered medications for this visit.    No Known Allergies  Family History  Problem Relation Age of Onset  . Diabetes Mother   . Hyperlipidemia Mother   . Cancer Father   . Cancer Sister   . Diabetes Sister   . Diabetes Maternal Aunt   . Arthritis Maternal Grandmother   . Diabetes Maternal Grandmother   . Arthritis  Maternal Grandfather   . Arthritis Paternal Grandmother   . Colon cancer Neg Hx   . Diabetes Brother   . Heart disease Brother     History   Social History  . Marital Status: Married    Spouse Name: N/A  . Number of Children: N/A  . Years of Education: N/A   Occupational History  . Not on file.   Social History Main Topics  . Smoking status: Former Smoker    Quit date: 10/18/2009  . Smokeless tobacco: Former Systems developer    Types: Chew    Quit date: 10/18/1994  . Alcohol Use: 3.0 - 3.6 oz/week    1-2 Glasses of wine, 3 Cans of beer, 1 Shots of liquor per week  . Drug Use: No  . Sexual Activity: Yes   Other Topics Concern  . Not on file   Social History Narrative     Constitutional: Denies fever, malaise, fatigue, headache or  abrupt weight changes.  Respiratory: Denies difficulty breathing, shortness of breath, cough or sputum production.   Cardiovascular: Pt reports right side chest pain. Denies  chest tightness, palpitations or swelling in the hands or feet.  Gastrointestinal: Denies abdominal pain, bloating, constipation, diarrhea or blood in the stool.  GU: Denies urgency, frequency, pain with urination, burning sensation, blood in urine, odor or discharge. Musculoskeletal: Denies decrease in range of motion, difficulty with gait, muscle pain or joint pain and swelling.  Neurological: Denies dizziness, difficulty with memory, difficulty with speech or problems with balance and coordination.   No other specific complaints in a complete review of systems (except as listed in HPI above).  Objective:   Physical Exam  BP 122/74 mmHg  Pulse 72  Temp(Src) 98.7 F (37.1 C) (Oral)  Wt 229 lb (103.874 kg)  SpO2 98% Wt Readings from Last 3 Encounters:  03/13/15 229 lb (103.874 kg)  12/23/14 234 lb (106.142 kg)  11/12/14 224 lb (101.606 kg)    General: Appears his stated age, obese in NAD. Neck: Neck supple, trachea midline. No masses, lumps or thyromegaly present.  Cardiovascular: Normal rate and rhythm. S1,S2 noted.  No murmur, rubs or gallops noted. No JVD or BLE edema. No carotid bruits noted. Pulmonary/Chest: Normal effort and positive vesicular breath sounds. No respiratory distress. No wheezes, rales or ronchi noted.  Abdomen: Soft and nontender. Normal bowel sounds, no bruits noted. No distention or masses noted. Liver, spleen and kidneys non palpable. Musculoskeletal: Unable to reproduce pain with palpation. Neurological: Alert and oriented.  Psychiatric: He does seem anxious today.  BMET    Component Value Date/Time   NA 139 10/25/2013 1602   K 4.4 10/25/2013 1602   CL 105 10/25/2013 1602   CO2 28 10/25/2013 1602   GLUCOSE 84 10/25/2013 1602   BUN 19 10/25/2013 1602   CREATININE 1.3 10/25/2013  1602   CALCIUM 9.5 10/25/2013 1602    Lipid Panel     Component Value Date/Time   CHOL 204* 10/25/2013 1602   TRIG 125.0 10/25/2013 1602   HDL 34.20* 10/25/2013 1602   CHOLHDL 6 10/25/2013 1602   VLDL 25.0 10/25/2013 1602    CBC    Component Value Date/Time   WBC 6.4 10/25/2013 1602   RBC 5.87* 10/25/2013 1602   HGB 15.1 10/25/2013 1602   HCT 45.9 10/25/2013 1602   PLT 238.0 10/25/2013 1602   MCV 78.3 10/25/2013 1602   MCHC 33.0 10/25/2013 1602   RDW 15.4* 10/25/2013 1602    Hgb A1C Lab Results  Component Value Date   HGBA1C 6.8* 01/21/2014         Assessment & Plan:   Hospital follow up for right side chest pain:  Hospital notes, labs, images and procedures reviewed in the presence of the patient Will repeat CBC and CMET today Will refer to GI for possible manometry  I do not think this is muscular given exam- he would like to continue with the Advil and request RX for muscle relaxer. eRx for Robaxin 500 mg TID prn

## 2015-03-13 NOTE — Patient Instructions (Signed)

## 2015-03-14 ENCOUNTER — Telehealth: Payer: Self-pay

## 2015-03-14 LAB — CBC WITH DIFFERENTIAL/PLATELET
Basophils Absolute: 0 10*3/uL (ref 0.0–0.1)
Basophils Relative: 1.1 % (ref 0.0–3.0)
EOS PCT: 5.2 % — AB (ref 0.0–5.0)
Eosinophils Absolute: 0.2 10*3/uL (ref 0.0–0.7)
HEMATOCRIT: 44.7 % (ref 39.0–52.0)
HEMOGLOBIN: 14.6 g/dL (ref 13.0–17.0)
Lymphocytes Relative: 48 % — ABNORMAL HIGH (ref 12.0–46.0)
Lymphs Abs: 2.2 10*3/uL (ref 0.7–4.0)
MCHC: 32.6 g/dL (ref 30.0–36.0)
MCV: 79.2 fl (ref 78.0–100.0)
MONO ABS: 0.4 10*3/uL (ref 0.1–1.0)
Monocytes Relative: 8.8 % (ref 3.0–12.0)
Neutro Abs: 1.7 10*3/uL (ref 1.4–7.7)
Neutrophils Relative %: 36.9 % — ABNORMAL LOW (ref 43.0–77.0)
Platelets: 242 10*3/uL (ref 150.0–400.0)
RBC: 5.64 Mil/uL (ref 4.22–5.81)
RDW: 14.7 % (ref 11.5–15.5)
WBC: 4.6 10*3/uL (ref 4.0–10.5)

## 2015-03-14 LAB — MICROALBUMIN / CREATININE URINE RATIO
Creatinine,U: 251.3 mg/dL
MICROALB UR: 0.7 mg/dL (ref 0.0–1.9)
Microalb Creat Ratio: 0.3 mg/g (ref 0.0–30.0)

## 2015-03-14 LAB — HEMOGLOBIN A1C: Hgb A1c MFr Bld: 6.3 % (ref 4.6–6.5)

## 2015-04-10 ENCOUNTER — Other Ambulatory Visit: Payer: Self-pay | Admitting: Internal Medicine

## 2015-04-10 ENCOUNTER — Encounter: Payer: Self-pay | Admitting: Gastroenterology

## 2015-04-10 ENCOUNTER — Encounter: Payer: Self-pay | Admitting: Internal Medicine

## 2015-04-10 NOTE — Telephone Encounter (Signed)
Last filled 03/13/15--please advise

## 2015-05-11 ENCOUNTER — Encounter: Payer: Self-pay | Admitting: Internal Medicine

## 2015-06-17 ENCOUNTER — Ambulatory Visit: Payer: 59 | Admitting: Gastroenterology

## 2015-07-15 ENCOUNTER — Encounter: Payer: Self-pay | Admitting: Gastroenterology

## 2015-07-15 ENCOUNTER — Ambulatory Visit (INDEPENDENT_AMBULATORY_CARE_PROVIDER_SITE_OTHER): Payer: 59 | Admitting: Gastroenterology

## 2015-07-15 VITALS — BP 112/78 | HR 76 | Ht 69.69 in | Wt 229.0 lb

## 2015-07-15 DIAGNOSIS — R079 Chest pain, unspecified: Secondary | ICD-10-CM | POA: Diagnosis not present

## 2015-07-15 NOTE — Patient Instructions (Addendum)
You will be set up for an ultrasound for RUQ, right chest pains.  You have been scheduled for an abdominal ultrasound at [facility] (1st floor of hospital) on 07/18/15 at 8 am. Please arrive 15 minutes prior to your appointment for registration. Make certain not to have anything to eat or drink 6 hours prior to your appointment. Should you need to reschedule your appointment, please contact radiology at [phone]. This test typically takes about 30 minutes to perform.

## 2015-07-15 NOTE — Progress Notes (Signed)
HPI: This is a   Very pleasant 54 year old man who was referred to me by [name], NP  to evaluate   Right-sided chest pains, right upper quadrant pain .    Chief complaint is  Right-sided chest pain, right upper quadrant pain  03/2014:  Colonoscopy, Dr. Ardis Hughs done for routine risk screening. This was a normal examination and he was recommended to have repeat colonoscopy at 12 years for screening   Right sided chest pains.  Had cardiac workup for this: stress test, CTs, EKG, troponin and these were all negative.  Knife like pain to his back and then down his right arm.  Very intense.  Took ibuprofen 800.  The pain went on intermittently for several weeks, would last 3-4 hours at a time.  Positions did not change the pain.  Maybe helped with pillow behind his back.  NO dysphagia.  Eating did not effect.  This was about 1-2 months ago.  He's been really pain free for 2-3 weeks.  Still has gallbladder.   Also mentions very mild intermittent right upper quadrant pains.  No nausea, vomiting.    His weight is down.    Review of systems: Pertinent positive and negative review of systems were noted in the above HPI section. Complete review of systems was performed and was otherwise normal.   Past Medical History  Diagnosis Date  . GERD (gastroesophageal reflux disease)   . Heart murmur   . Chicken pox   . Diabetes mellitus without complication   . Allergy   . Sleep apnea     does not use cpap    Past Surgical History  Procedure Laterality Date  . Wrist surgery Left 1984    cyst removal    Current Outpatient Prescriptions  Medication Sig Dispense Refill  . glucose blood (TRUETEST TEST) test strip 1 each by Other route 2 (two) times daily. Use as instructed 100 each 6  . ibuprofen (ADVIL,MOTRIN) 800 MG tablet TAKE 1 TABLET (800 MG) BY MOUTH EVERY 8 HOURS AS NEEDED. 60 tablet 1  . metFORMIN (GLUCOPHAGE) 1000 MG tablet Take 1 tablet (1,000 mg total) by mouth 2 (two)  times daily with a meal. 60 tablet 5  . methocarbamol (ROBAXIN) 500 MG tablet Take 1 tablet (500 mg total) by mouth 2 (two) times daily as needed for muscle spasms. 30 tablet 0  . methylcellulose (FIBER THERAPY) oral powder Take 1 packet by mouth 2 (two) times daily as needed (constipation).     . Omega 3 1000 MG CAPS Take by mouth.    . TRUEPLUS LANCETS 30G MISC 1 each by Does not apply route 2 (two) times daily. Dx E11.9 100 each 6   No current facility-administered medications for this visit.    Allergies as of 07/15/2015  . (No Known Allergies)    Family History  Problem Relation Age of Onset  . Diabetes Mother   . Hyperlipidemia Mother   . Bladder Cancer Father   . Liver cancer Sister   . Diabetes Sister   . Diabetes Maternal Aunt   . Arthritis Maternal Grandmother   . Diabetes Maternal Grandmother   . Arthritis Maternal Grandfather   . Arthritis Paternal Grandmother   . Colon cancer Neg Hx   . Diabetes Brother   . Heart disease Brother     Social History   Social History  . Marital Status: Married    Spouse Name: N/A  . Number of Children: 2  . 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 Smoking status: Former Smoker    Quit date: 10/18/2009  . Smokeless tobacco: Former Systems developer    Types: Chew    Quit date: 10/18/1994  . Alcohol Use: 3.0 - 3.6 oz/week    1-2 Glasses of wine, 3 Cans of beer, 1 Shots of liquor per week     Comment: OCC.  . Drug Use: No  . Sexual Activity: Yes   Other Topics Concern  . Not on file   Social History Narrative     Physical Exam: BP 112/78 mmHg  Pulse 76  Ht 5' 9.69" (1.77 m)  Wt 229 lb (103.874 kg)  BMI 33.16 kg/m2 Constitutional: generally well-appearing Psychiatric: alert and oriented x3 Eyes: extraocular movements intact Mouth: oral pharynx moist, no lesions Neck: supple no lymphadenopathy Cardiovascular: heart regular rate and rhythm Lungs: clear to auscultation  bilaterally Abdomen: soft, nontender, nondistended, no obvious ascites, no peritoneal signs, normal bowel sounds Extremities: no lower extremity edema bilaterally Skin: no lesions on visible extremities   Assessment and plan: 54 y.o. male with   Right-sided chest pain , also intermittent right upper quadrant pain   right-sided chest pain is really why he is here however he did mention the mild intermittent right upper quadrant pains after questioning. Perhaps this is gallbladder, gallstone related. It would certainly be an unusual presentation for it but not heard of. This does not seem at all like esophageal spasm gastric related. I recommended abdominal ultrasound to check for gallstones. If this is negative then I would do no further testing unless he really presents with significant symptoms again. If he has numerous gallstones in his gallbladder would likely recommended to general surgery consider elective cholecystectomy.   Owens Loffler, MD [facility] 07/15/2015, 2:05 PM  Cc: [name], NP

## 2015-07-18 ENCOUNTER — Ambulatory Visit (HOSPITAL_COMMUNITY)
Admission: RE | Admit: 2015-07-18 | Discharge: 2015-07-18 | Disposition: A | Discharge status: Home / Self Care | Payer: 59 | Source: Ambulatory Visit | Attending: Gastroenterology | Admitting: Gastroenterology

## 2015-07-18 DIAGNOSIS — R1013 Epigastric pain: Secondary | ICD-10-CM | POA: Diagnosis present

## 2015-07-18 DIAGNOSIS — R079 Chest pain, unspecified: Secondary | ICD-10-CM

## 2015-07-18 DIAGNOSIS — K820 Obstruction of gallbladder: Secondary | ICD-10-CM | POA: Diagnosis not present

## 2015-08-04 ENCOUNTER — Encounter: Payer: Self-pay | Admitting: Internal Medicine

## 2015-08-04 MED ORDER — METFORMIN HCL 1000 MG PO TABS: 1000.0000 mg | ORAL_TABLET | Freq: Two times a day (BID) | ORAL | Status: DC

## 2015-08-21 ENCOUNTER — Telehealth: Payer: Self-pay

## 2015-08-21 NOTE — Telephone Encounter (Signed)
Pt is overdue for CPE--Left detailed msg on VM per HIPAA

## 2015-08-22 ENCOUNTER — Other Ambulatory Visit: Payer: Self-pay

## 2015-08-22 NOTE — Patient Outreach (Signed)
[facility]) Care Management  08/22/2015  OBE AHLERS July 14, 1961 [id number]   Telephone call to schedule follow up appointment.  Member was last seen by pharmacist on 12/23/14.  No answer and message left.  Appointment request letter to be sent. [name] RN, [facility] Care Management Coordinator-Link to [place] [facility] [phone]

## 2015-09-18 ENCOUNTER — Other Ambulatory Visit: Payer: Self-pay

## 2015-09-18 VITALS — BP 122/82 | HR 67 | Resp 16 | Ht 72.0 in | Wt 231.4 lb

## 2015-09-18 DIAGNOSIS — E119 Type 2 diabetes mellitus without complications: Secondary | ICD-10-CM

## 2015-09-18 LAB — POCT GLYCOSYLATED HEMOGLOBIN (HGB A1C): HEMOGLOBIN A1C: 5

## 2015-09-18 NOTE — Patient Instructions (Addendum)
1. Plan to check blood sugar twice a day fasting and 1  to 2 hours after eating.  Goals of 80-130 before eating and after less 140-180.  2. Plan to do walk/exercise 3 times a week for 30-60 minutes. Plan to do weight training 2 times a week Plan to get 150 minutes a week. 3. Plan to lose  to 1 lb a week. Try using Lose it app 4. Plan to eat 4-5 (60-75gm) servings of carbohydrate per meal and 15gm for snacks.  Plan to eat protein with meals and snacks 5. Plan to visit with provider this or next month 6. Plan to get eye appointment and dental appointment             Plan to return to [facility] to [facility]ness on 12/18/15 at [place] at [facility] office

## 2015-09-18 NOTE — Patient Outreach (Signed)
[facility]) Care Management   09/18/2015  Marc Aguilar 1961-01-09 [id number]  Marc Aguilar is an 54 y.o. male.   Member seen for follow up office visit for Link to Wellness program for self management of Type 2 diabetes  Subjective: Member states he has been trying to watch his portion sizes but he does not always remember what they are.  States he tries to get 10,000 steps in a day and he will get on the treadmill at the end of the day to reach that goal.  States his blood sugars have been ranging 80-120 in the morning.  States he has not seen an eye doctor or dentist in about 2  Years.  Objective:   Review of Systems  All other systems reviewed and are negative. POC HemoglobinA1C-5%  Physical Exam  Today's Vitals   09/18/15 1030  BP: 122/82  Pulse: 67  Resp: 16  Height: 1.829 m (6')  Weight: 231 lb 6.4 oz (104.962 kg)  SpO2: 99%  PainSc: 0-No pain    Current Medications:   Current Outpatient Prescriptions  Medication Sig Dispense Refill  . glucose blood (TRUETEST TEST) test strip 1 each by Other route 2 (two) times daily. Use as instructed 100 each 6  . ibuprofen (ADVIL,MOTRIN) 800 MG tablet TAKE 1 TABLET (800 MG) BY MOUTH EVERY 8 HOURS AS NEEDED. 60 tablet 1  . metFORMIN (GLUCOPHAGE) 1000 MG tablet Take 1 tablet (1,000 mg total) by mouth 2 (two) times daily with a meal. 60 tablet 1  . methylcellulose (FIBER THERAPY) oral powder Take 1 packet by mouth 2 (two) times daily as needed (constipation).     . Omega 3 1000 MG CAPS Take by mouth.    . TRUEPLUS LANCETS 30G MISC 1 each by Does not apply route 2 (two) times daily. Dx E11.9 100 each 6  . methocarbamol (ROBAXIN) 500 MG tablet Take 1 tablet (500 mg total) by mouth 2 (two) times daily as needed for muscle spasms. (Patient not taking: Reported on 09/18/2015) 30 tablet 0   No current facility-administered medications for this visit.    Functional Status:   In your present state of health, do you have  any difficulty performing the following activities: 09/18/2015  Hearing? N  Vision? N  Difficulty concentrating or making decisions? N  Walking or climbing stairs? N  Doing errands, shopping? N    Fall/Depression Screening:    PHQ 2/9 Scores 09/18/2015 03/13/2015 11/12/2014 10/25/2013  PHQ - 2 Score 0 0 0 0    Assessment:   Member seen for follow up office visit for Link to Wellness program for self management of Type 2 diabetes.  Member is meeting diabetes self management goal of 6.5 or less with today's result of 5%.  Member is maintaining being active with activity tracker use. Reports adherence with diet.  Reports blood sugars ranging 80-120 fasting.  Member is overdue for annual eye exam and dental cleaning.  Plan:   Plan to check blood sugar twice a day fasting and 1  to 2 hours after eating.  Goals of 80-130 before eating and after less 140-180.  Plan to do walk/exercise 3 times a week for 30-60 minutes. Plan to do weight training 2 times a week Plan to get 150 minutes a week. Plan to lose  to 1 lb a week. Try using Lose it app Plan to eat 4-5 (60-75gm) servings of carbohydrate per meal and 15gm for snacks.  Plan to eat protein  with meals and snacks Plan to visit with provider this or next month Plan to get eye appointment and dental appointment       Plan to return to Link to Wellness on 12/18/15 at [place] at [facility] office [place] Problem One        Most Recent Value   Care Plan Problem One  Potential for elevated blood sugars related to dx of Type 2 DM   Role Documenting the Problem One  Care Management [place] for Problem One  Active   [facility] Long Term Goal (31-90 days)  Member will maintain hemoglobin A1C at or below 6.5 for the next 90 days   [facility] Long Term Goal Start Date  09/18/15   Interventions for Problem One Long Term Goal  Reinforced CHO counting and portion control, given handout on building healthy meals and CHO portions, Reinforced importance of  regualr exercise for glycemic control and encouraged to add strength training to his work outs,Discussed tips to aid with weight loss, Instructed on importance of annual eye exams and routine dental care, Instructed to call provider to schedule appointment for checkup    [name] RN, [facility] Care Management Coordinator-Link to [place] Management [phone]

## 2015-09-18 NOTE — Telephone Encounter (Signed)
Pt is scheduled for CPE on 10/02/15 at 8:15am. Pt is aware.

## 2015-10-02 ENCOUNTER — Ambulatory Visit (INDEPENDENT_AMBULATORY_CARE_PROVIDER_SITE_OTHER): Payer: 59 | Admitting: Internal Medicine

## 2015-10-02 ENCOUNTER — Encounter: Payer: Self-pay | Admitting: Internal Medicine

## 2015-10-02 ENCOUNTER — Other Ambulatory Visit: Payer: Self-pay | Admitting: Internal Medicine

## 2015-10-02 ENCOUNTER — Ambulatory Visit (INDEPENDENT_AMBULATORY_CARE_PROVIDER_SITE_OTHER)
Admission: RE | Admit: 2015-10-02 | Discharge: 2015-10-02 | Disposition: A | Discharge status: Home / Self Care | Payer: 59 | Source: Ambulatory Visit | Attending: Internal Medicine | Admitting: Internal Medicine

## 2015-10-02 VITALS — BP 118/82 | HR 73 | Temp 98.6°F | Ht 70.0 in | Wt 234.0 lb

## 2015-10-02 DIAGNOSIS — E119 Type 2 diabetes mellitus without complications: Secondary | ICD-10-CM | POA: Diagnosis not present

## 2015-10-02 DIAGNOSIS — R208 Other disturbances of skin sensation: Secondary | ICD-10-CM

## 2015-10-02 DIAGNOSIS — Z0000 Encounter for general adult medical examination without abnormal findings: Secondary | ICD-10-CM

## 2015-10-02 DIAGNOSIS — R200 Anesthesia of skin: Secondary | ICD-10-CM

## 2015-10-02 DIAGNOSIS — E785 Hyperlipidemia, unspecified: Secondary | ICD-10-CM | POA: Diagnosis not present

## 2015-10-02 DIAGNOSIS — E1169 Type 2 diabetes mellitus with other specified complication: Secondary | ICD-10-CM | POA: Insufficient documentation

## 2015-10-02 DIAGNOSIS — Z0001 Encounter for general adult medical examination with abnormal findings: Secondary | ICD-10-CM

## 2015-10-02 DIAGNOSIS — J302 Other seasonal allergic rhinitis: Secondary | ICD-10-CM

## 2015-10-02 DIAGNOSIS — K219 Gastro-esophageal reflux disease without esophagitis: Secondary | ICD-10-CM

## 2015-10-02 DIAGNOSIS — G4733 Obstructive sleep apnea (adult) (pediatric): Secondary | ICD-10-CM

## 2015-10-02 LAB — CBC
HEMATOCRIT: 45.5 % (ref 39.0–52.0)
Hemoglobin: 14.9 g/dL (ref 13.0–17.0)
MCHC: 32.8 g/dL (ref 30.0–36.0)
MCV: 78.3 fl (ref 78.0–100.0)
Platelets: 232 10*3/uL (ref 150.0–400.0)
RBC: 5.82 Mil/uL — AB (ref 4.22–5.81)
RDW: 14.8 % (ref 11.5–15.5)
WBC: 5.1 10*3/uL (ref 4.0–10.5)

## 2015-10-02 LAB — COMPREHENSIVE METABOLIC PANEL
ALBUMIN: 4.3 g/dL (ref 3.5–5.2)
ALK PHOS: 53 U/L (ref 39–117)
ALT: 17 U/L (ref 0–53)
AST: 22 U/L (ref 0–37)
BUN: 18 mg/dL (ref 6–23)
CO2: 29 mEq/L (ref 19–32)
CREATININE: 1 mg/dL (ref 0.40–1.50)
Calcium: 9.5 mg/dL (ref 8.4–10.5)
Chloride: 104 mEq/L (ref 96–112)
GFR: 100.78 mL/min (ref >60.00)
GLUCOSE: 103 mg/dL — AB (ref 70–99)
POTASSIUM: 4.4 meq/L (ref 3.5–5.1)
SODIUM: 138 meq/L (ref 135–145)
TOTAL PROTEIN: 7.3 g/dL (ref 6.0–8.3)
Total Bilirubin: 0.5 mg/dL (ref 0.2–1.2)

## 2015-10-02 LAB — LIPID PANEL
Cholesterol: 196 mg/dL (ref 0–200)
HDL: 42.6 mg/dL (ref >39.00)
LDL Cholesterol: 128 mg/dL — ABNORMAL HIGH (ref 0–99)
NONHDL: 153.21
Total CHOL/HDL Ratio: 5
Triglycerides: 125 mg/dL (ref 0.0–149.0)
VLDL: 25 mg/dL (ref 0.0–40.0)

## 2015-10-02 LAB — PSA: PSA: 1.36 ng/mL (ref 0.10–4.00)

## 2015-10-02 MED ORDER — METFORMIN HCL 1000 MG PO TABS: 500.0000 mg | ORAL_TABLET | Freq: Two times a day (BID) | ORAL | Status: DC

## 2015-10-02 NOTE — Progress Notes (Signed)
Subjective:    Patient ID: Marc Aguilar, male    DOB: 04/19/1961, 54 y.o.   MRN: [id number]  HPI  Pt presents to the clinic today for his annual exam. He is also due for follow up of chronic conditions, see separate note.  Flu: 07/2015 Tetanus: within the last 10 years Pneumovax: 07/2014 PSA Screening: 10/2013 Colon Screening: 03/2014 Vision Screening: 12/07/2013 at [facility], scheduled for next week Dentist: annually, scheduled for next week  Diet: He does eat meat. He consumes fruits and veggies daily. He tries to avoid fried foods and carbs. He does drink diet soda occasionally. He drinks water daily. Exercise: None  Review of Systems      Past Medical History  Diagnosis Date  . GERD (gastroesophageal reflux disease)   . Heart murmur   . Chicken pox   . Diabetes mellitus without complication ([place])   . Allergy   . Sleep apnea     does not use cpap    Current Outpatient Prescriptions  Medication Sig Dispense Refill  . glucose blood (TRUETEST TEST) test strip 1 each by Other route 2 (two) times daily. Use as instructed 100 each 6  . ibuprofen (ADVIL,MOTRIN) 800 MG tablet TAKE 1 TABLET (800 MG) BY MOUTH EVERY 8 HOURS AS NEEDED. 60 tablet 1  . metFORMIN (GLUCOPHAGE) 1000 MG tablet Take 1 tablet (1,000 mg total) by mouth 2 (two) times daily with a meal. 60 tablet 1  . methocarbamol (ROBAXIN) 500 MG tablet Take 1 tablet (500 mg total) by mouth 2 (two) times daily as needed for muscle spasms. (Patient not taking: Reported on 09/18/2015) 30 tablet 0  . methylcellulose (FIBER THERAPY) oral powder Take 1 packet by mouth 2 (two) times daily as needed (constipation).     . Omega 3 1000 MG CAPS Take by mouth.    . TRUEPLUS LANCETS 30G MISC 1 each by Does not apply route 2 (two) times daily. Dx E11.9 100 each 6   No current facility-administered medications for this visit.    No Known Allergies  Family History  Problem Relation Age of Onset  . Diabetes Mother   .  Hyperlipidemia Mother   . Bladder Cancer Father   . Liver cancer Sister   . Diabetes Sister   . Diabetes Maternal Aunt   . Arthritis Maternal Grandmother   . Diabetes Maternal Grandmother   . Arthritis Maternal Grandfather   . Arthritis Paternal Grandmother   . Colon cancer Neg Hx   . Diabetes Brother   . Heart disease Brother     Social History   Social History  . Marital Status: Married    Spouse Name: N/A  . Number of Children: 2  . Years of Education: N/A   Occupational History  . [employer] - EMT    Social History Main Topics  . Smoking status: Former Smoker    Quit date: 10/18/2009  . Smokeless tobacco: Former Systems developer    Types: Chew    Quit date: 10/18/1994  . Alcohol Use: 3.0 - 3.6 oz/week    1-2 Glasses of wine, 3 Cans of beer, 1 Shots of liquor per week     Comment: OCC.  . Drug Use: No  . Sexual Activity: Yes   Other Topics Concern  . Not on file   Social History Narrative     Constitutional: Denies fever, malaise, fatigue, headache or abrupt weight changes.  HEENT: Pt reports post nasal drip. Denies eye pain, eye redness,  ear pain, ringing in the ears, wax buildup, runny nose, nasal congestion, bloody nose, or sore throat. Respiratory: Denies difficulty breathing, shortness of breath, cough or sputum production.   Cardiovascular: Denies chest pain, chest tightness, palpitations or swelling in the hands or feet.  Gastrointestinal: Denies abdominal pain, bloating, constipation, diarrhea or blood in the stool.  GU: Denies urgency, frequency, pain with urination, burning sensation, blood in urine, odor or discharge. Musculoskeletal: Denies decrease in range of motion, difficulty with gait, muscle pain or joint pain and swelling.  Skin: Denies redness, rashes, lesions or ulcercations.  Neurological: Pt reports numbness in right thigh. Denies dizziness, difficulty with memory, difficulty with speech or problems with balance and coordination.  Psych: Denies anxiety,  depression, SI/HI.  No other specific complaints in a complete review of systems (except as listed in HPI above).  Objective:   Physical Exam  BP 118/82 mmHg  Pulse 73  Temp(Src) 98.6 F (37 C) (Oral)  Ht 5\' 10"  (1.778 m)  Wt 234 lb (106.142 kg)  BMI 33.58 kg/m2  SpO2 97%  Wt Readings from Last 3 Encounters:  09/18/15 231 lb 6.4 oz (104.962 kg)  07/15/15 229 lb (103.874 kg)  03/13/15 229 lb (103.874 kg)    General: Appears his stated age, obese in NAD. Skin: Warm, dry and intact. No rashes, lesions or ulcerations noted. HEENT: Head: normal shape and size; Eyes: sclera white, no icterus, conjunctiva pink, PERRLA and EOMs intact; Ears: Tm's gray and intact, normal light reflex; Throat/Mouth: Teeth present, mucosa pink and moist, +PND,  no exudate, lesions or ulcerations noted.  Neck:  Neck supple, trachea midline. No masses, lumps or thyromegaly present.  Cardiovascular: Normal rate and rhythm. S1,S2 noted.  No murmur, rubs or gallops noted. No JVD or BLE edema. No carotid bruits noted. Pulmonary/Chest: Normal effort and positive vesicular breath sounds. No respiratory distress. No wheezes, rales or ronchi noted.  Abdomen: Soft and nontender. Normal bowel sounds. No distention or masses noted. Liver, spleen and kidneys non palpable. Musculoskeletal: Normal flexion and extension of the spine. No bony tenderness noted over the spine. Strength 5/5 BUE/BLE. No signs of joint swelling. No difficulty with gait. Neurological: Alert and oriented. Cranial nerves II-XII grossly intact.  He does have decreased sensation in right lateral thigh but no pain with palpation. Coordination normal.  Psychiatric: Mood and affect normal. Behavior is normal. Judgment and thought content normal.     BMET    Component Value Date/Time   NA 139 10/25/2013 1602   K 4.4 10/25/2013 1602   CL 105 10/25/2013 1602   CO2 28 10/25/2013 1602   GLUCOSE 84 10/25/2013 1602   BUN 19 10/25/2013 1602   CREATININE  1.3 10/25/2013 1602   CALCIUM 9.5 10/25/2013 1602    Lipid Panel     Component Value Date/Time   CHOL 204* 10/25/2013 1602   TRIG 125.0 10/25/2013 1602   HDL 34.20* 10/25/2013 1602   CHOLHDL 6 10/25/2013 1602   VLDL 25.0 10/25/2013 1602    CBC    Component Value Date/Time   WBC 4.6 03/13/2015 1355   RBC 5.64 03/13/2015 1355   HGB 14.6 03/13/2015 1355   HCT 44.7 03/13/2015 1355   PLT 242.0 03/13/2015 1355   MCV 79.2 03/13/2015 1355   MCHC 32.6 03/13/2015 1355   RDW 14.7 03/13/2015 1355   LYMPHSABS 2.2 03/13/2015 1355   MONOABS 0.4 03/13/2015 1355   EOSABS 0.2 03/13/2015 1355   BASOSABS 0.0 03/13/2015 1355    Hgb  A1C Lab Results  Component Value Date   HGBA1C 5 09/18/2015         Assessment & Plan:   Preventative Health Maintenance:  Flu shot UTD He will bring me a record of his Tetanus vaccine PSA today with labs Colonoscopy UTD Encouraged him to consume a balanced diet and start an exercise regimen Encouraged him to continue to see an eye doctor and dentist annually  RTC in 6 months to follow up chronic conditions  HPI:  Pt presents to the clinic today to follow up chronic conditions:  DM 2: His last A1C was 5% 09/2015. His blood sugars range 70- 150. He is taking Metformin 1000 mg BID. His last LDL was 143.5, 10/2013. He is not on cholesterol lowering medication. He does take Fish Oil daily. His last eye exam was 11/2013 but he has one scheduled for next week. Flu 07/2015. Pneumovax 07/2014.  GERD: His symptoms are diet controlled. He takes Tums as needed.  OSA: He only uses his CPAP machine occassionally. He has been feeling fatigued during the day. He has gained 5 lbs since his last visit.  Seasonal Allergies: Worse in the spring. He does not take anything for them, unless they get really bad. He does have a little bit of post nasal drip now.  He does c/o numbness/burning sensation in his right thigh. This has been going on for year. It is constant.  He denies pain or swelling. He denies any injury to the area. He denies low back pain. He has not tried anything OTC.  Review of Systems:   Past Medical History  Diagnosis Date  . GERD (gastroesophageal reflux disease)   . Heart murmur   . Chicken pox   . Diabetes mellitus without complication ([place])   . Allergy   . Sleep apnea     does not use cpap    Current Outpatient Prescriptions  Medication Sig Dispense Refill  . glucose blood (TRUETEST TEST) test strip 1 each by Other route 2 (two) times daily. Use as instructed 100 each 6  . ibuprofen (ADVIL,MOTRIN) 800 MG tablet TAKE 1 TABLET (800 MG) BY MOUTH EVERY 8 HOURS AS NEEDED. 60 tablet 1  . metFORMIN (GLUCOPHAGE) 1000 MG tablet Take 1 tablet (1,000 mg total) by mouth 2 (two) times daily with a meal. 60 tablet 1  . methocarbamol (ROBAXIN) 500 MG tablet Take 1 tablet (500 mg total) by mouth 2 (two) times daily as needed for muscle spasms. (Patient not taking: Reported on 09/18/2015) 30 tablet 0  . methylcellulose (FIBER THERAPY) oral powder Take 1 packet by mouth 2 (two) times daily as needed (constipation).     . Omega 3 1000 MG CAPS Take by mouth.    . TRUEPLUS LANCETS 30G MISC 1 each by Does not apply route 2 (two) times daily. Dx E11.9 100 each 6   No current facility-administered medications for this visit.    No Known Allergies  Family History  Problem Relation Age of Onset  . Diabetes Mother   . Hyperlipidemia Mother   . Bladder Cancer Father   . Liver cancer Sister   . Diabetes Sister   . Diabetes Maternal Aunt   . Arthritis Maternal Grandmother   . Diabetes Maternal Grandmother   . Arthritis Maternal Grandfather   . Arthritis Paternal Grandmother   . Colon cancer Neg Hx   . Diabetes Brother   . Heart disease Brother     Social History   Social History  .  Marital Status: Married    Spouse Name: N/A  . Number of Children: 2  . 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 Smoking status: Former Smoker    Quit date: 10/18/2009  . Smokeless tobacco: Former Systems developer    Types: Chew    Quit date: 10/18/1994  . Alcohol Use: 3.0 - 3.6 oz/week    1-2 Glasses of wine, 3 Cans of beer, 1 Shots of liquor per week     Comment: OCC.  . Drug Use: No  . Sexual Activity: Yes   Other Topics Concern  . Not on file   Social History Narrative     Constitutional: Denies fever, malaise, fatigue, headache or abrupt weight changes.  HEENT: Pt reports post nasal drip. Denies eye pain, eye redness, ear pain, ringing in the ears, wax buildup, runny nose, nasal congestion, bloody nose, or sore throat. Respiratory: Denies difficulty breathing, shortness of breath, cough or sputum production.   Cardiovascular: Denies chest pain, chest tightness, palpitations or swelling in the hands or feet.  Gastrointestinal: Denies abdominal pain, bloating, constipation, diarrhea or blood in the stool.  GU: Denies urgency, frequency, pain with urination, burning sensation, blood in urine, odor or discharge. Musculoskeletal: Denies decrease in range of motion, difficulty with gait, muscle pain or joint pain and swelling.  Skin: Denies redness, rashes, lesions or ulcercations.  Neurological: Pt reports numbness in right thigh. Denies dizziness, difficulty with memory, difficulty with speech or problems with balance and coordination.  Psych: Denies anxiety, depression, SI/HI.   No other specific complaints in a complete review of systems (except as listed in HPI above).  Objective:  BP 118/82 mmHg  Pulse 73  Temp(Src) 98.6 F (37 C) (Oral)  Ht 5\' 10"  (1.778 m)  Wt 234 lb (106.142 kg)  BMI 33.58 kg/m2  SpO2 97%   General: Appears his stated age, obese in NAD. HEENT: Head: normal shape and size; Eyes: sclera white, no icterus, conjunctiva pink, PERRLA and EOMs intact; Ears: Tm's gray and intact, normal light reflex; Throat/Mouth: Teeth present, mucosa pink and moist, +PND, no  exudate, lesions or ulcerations noted.  Cardiovascular: Normal rate and rhythm. S1,S2 noted.  No murmur, rubs or gallops noted. No JVD or BLE edema. No carotid bruits noted. Pulmonary/Chest: Normal effort and positive vesicular breath sounds. No respiratory distress. No wheezes, rales or ronchi noted.  Abdomen: Soft and nontender. Normal bowel sounds. No distention or masses noted. Liver, spleen and kidneys non palpable. Musculoskeletal: Normal flexion and extension of the spine. No bony tenderness noted over the spine. Strength 5/5 BUE/BLE. No signs of joint swelling. No difficulty with gait. Neurological: Alert and oriented. Cranial nerves II-XII grossly intact.  He does have decreased sensation in right lateral thigh but no pain with palpation. Coordination normal.  Psychiatric: Mood and affect normal. Behavior is normal. Judgment and thought content normal.   Assessment and Plan:  Numbness of right thigh:  ? sciatica versus nerve irritation Xray of lumbar spine today If persists, consider referral for EMG versus MRI of lumbar spine  RTC in 6 months to follow up chronic conditions

## 2015-10-02 NOTE — Addendum Note (Signed)
Addended by: [name] on: 10/02/2015 12:01 PM   Modules accepted: Orders, SmartSet

## 2015-10-02 NOTE — Assessment & Plan Note (Addendum)
A1C 5 Reduce Metformin to 500 mg BID Microalbumin done 02/2015 Flu and Pneumovax UTD Eye exam scheduled next week, advised him to send me a copy Consume a low fat, low carb diet Foot exam today

## 2015-10-02 NOTE — Assessment & Plan Note (Signed)
Advised him to start taking Allegra daily

## 2015-10-02 NOTE — Assessment & Plan Note (Signed)
Diet controlled Discussed how weight loss could help improve his reflux

## 2015-10-02 NOTE — Assessment & Plan Note (Signed)
Will check CMET and Lipid Profile today If LDL not < 100, discuss statin therapy Advised him to start taking a baby ASA daily

## 2015-10-02 NOTE — Assessment & Plan Note (Signed)
Encouraged him to wear his CPAP nightly Discussed how weight loss can improve his sleep apnea

## 2015-10-02 NOTE — Patient Instructions (Addendum)
Let me know about your Tetanus injection Cut your Metformin in half, so that you are taking 500 mg BID Start taking Allegra once daily to help clear up the mucous in the back of your throat. I want you to start taking a baby ASA daily We are checking an xray of your lumbar spine. Depending on the results, we may send you to neurology for EMG testing or get a MRI of your lumbar spine. Have your eye doctor send me a copy of your eye exam results. Health Maintenance, Male A healthy lifestyle and preventative care can promote health and wellness.  Maintain regular health, dental, and eye exams.  Eat a healthy diet. Foods like vegetables, fruits, whole grains, low-fat dairy products, and lean protein foods contain the nutrients you need and are low in calories. Decrease your intake of foods high in solid fats, added sugars, and salt. Get information about a proper diet from your health care provider, if necessary.  Regular physical exercise is one of the most important things you can do for your health. Most adults should get at least 150 minutes of moderate-intensity exercise (any activity that increases your heart rate and causes you to sweat) each week. In addition, most adults need muscle-strengthening exercises on 2 or more days a week.   Maintain a healthy weight. The body mass index (BMI) is a screening tool to identify possible weight problems. It provides an estimate of body fat based on height and weight. Your health care provider can find your BMI and can help you achieve or maintain a healthy weight. For males 20 years and older:  A BMI below 18.5 is considered underweight.  A BMI of 18.5 to 24.9 is normal.  A BMI of 25 to 29.9 is considered overweight.  A BMI of 30 and above is considered obese.  Maintain normal blood lipids and cholesterol by exercising and minimizing your intake of saturated fat. Eat a balanced diet with plenty of fruits and vegetables. Blood tests for lipids and  cholesterol should begin at age 56 and be repeated every 5 years. If your lipid or cholesterol levels are high, you are over age 34, or you are at high risk for heart disease, you may need your cholesterol levels checked more frequently.Ongoing high lipid and cholesterol levels should be treated with medicines if diet and exercise are not working.  If you smoke, find out from your health care provider how to quit. If you do not use tobacco, do not start.  Lung cancer screening is recommended for adults aged 28-80 years who are at high risk for developing lung cancer because of a history of smoking. A yearly low-dose CT scan of the lungs is recommended for people who have at least a 30-pack-year history of smoking and are current smokers or have quit within the past 15 years. A pack year of smoking is smoking an average of 1 pack of cigarettes a day for 1 year (for example, a 30-pack-year history of smoking could mean smoking 1 pack a day for 30 years or 2 packs a day for 15 years). Yearly screening should continue until the smoker has stopped smoking for at least 15 years. Yearly screening should be stopped for people who develop a health problem that would prevent them from having lung cancer treatment.  If you choose to drink alcohol, do not have more than 2 drinks per day. One drink is considered to be 12 oz (360 mL) of beer, 5 oz (150  mL) of wine, or 1.5 oz (45 mL) of liquor.  Avoid the use of street drugs. Do not share needles with anyone. Ask for help if you need support or instructions about stopping the use of drugs.  High blood pressure causes heart disease and increases the risk of stroke. High blood pressure is more likely to develop in:  People who have blood pressure in the end of the normal range (100-139/85-89 mm Hg).  People who are overweight or obese.  People who are African American.  If you are 11-26 years of age, have your blood pressure checked every 3-5 years. If you are 70  years of age or older, have your blood pressure checked every year. You should have your blood pressure measured twice--once when you are at a hospital or clinic, and once when you are not at a hospital or clinic. Record the average of the two measurements. To check your blood pressure when you are not at a hospital or clinic, you can use:  An automated blood pressure machine at a pharmacy.  A home blood pressure monitor.  If you are 84-56 years old, ask your health care provider if you should take aspirin to prevent heart disease.  Diabetes screening involves taking a blood sample to check your fasting blood sugar level. This should be done once every 3 years after age 17 if you are at a normal weight and without risk factors for diabetes. Testing should be considered at a younger age or be carried out more frequently if you are overweight and have at least 1 risk factor for diabetes.  Colorectal cancer can be detected and often prevented. Most routine colorectal cancer screening begins at the age of 95 and continues through age 6. However, your health care provider may recommend screening at an earlier age if you have risk factors for colon cancer. On a yearly basis, your health care provider may provide home test kits to check for hidden blood in the stool. A small camera at the end of a tube may be used to directly examine the colon (sigmoidoscopy or colonoscopy) to detect the earliest forms of colorectal cancer. Talk to your health care provider about this at age 54 when routine screening begins. A direct exam of the colon should be repeated every 5-10 years through age 69, unless early forms of precancerous polyps or small growths are found.  People who are at an increased risk for hepatitis B should be screened for this virus. You are considered at high risk for hepatitis B if:  You were born in a country where hepatitis B occurs often. Talk with your health care provider about which countries  are considered high risk.  Your parents were born in a high-risk country and you have not received a shot to protect against hepatitis B (hepatitis B vaccine).  You have HIV or AIDS.  You use needles to inject street drugs.  You live with, or have sex with, someone who has hepatitis B.  You are a man who has sex with other men (MSM).  You get hemodialysis treatment.  You take certain medicines for conditions like cancer, organ transplantation, and autoimmune conditions.  Hepatitis C blood testing is recommended for all people born from [birth year] through 1963 and any individual with known risk factors for hepatitis C.  Healthy men should no longer receive prostate-specific antigen (PSA) blood tests as part of routine cancer screening. Talk to your health care provider about prostate cancer screening.  Testicular  cancer screening is not recommended for adolescents or adult males who have no symptoms. Screening includes self-exam, a health care provider exam, and other screening tests. Consult with your health care provider about any symptoms you have or any concerns you have about testicular cancer.  Practice safe sex. Use condoms and avoid high-risk sexual practices to reduce the spread of sexually transmitted infections (STIs).  You should be screened for STIs, including gonorrhea and chlamydia if:  You are sexually active and are younger than 24 years.  You are older than 24 years, and your health care provider tells you that you are at risk for this type of infection.  Your sexual activity has changed since you were last screened, and you are at an increased risk for chlamydia or gonorrhea. Ask your health care provider if you are at risk.  If you are at risk of being infected with HIV, it is recommended that you take a prescription medicine daily to prevent HIV infection. This is called pre-exposure prophylaxis (PrEP). You are considered at risk if:  You are a man who has sex with  other men (MSM).  You are a heterosexual man who is sexually active with multiple partners.  You take drugs by injection.  You are sexually active with a partner who has HIV.  Talk with your health care provider about whether you are at high risk of being infected with HIV. If you choose to begin PrEP, you should first be tested for HIV. You should then be tested every 3 months for as long as you are taking PrEP.  Use sunscreen. Apply sunscreen liberally and repeatedly throughout the day. You should seek shade when your shadow is shorter than you. Protect yourself by wearing long sleeves, pants, a wide-brimmed hat, and sunglasses year round whenever you are outdoors.  Tell your health care provider of new moles or changes in moles, especially if there is a change in shape or color. Also, tell your health care provider if a mole is larger than the size of a pencil eraser.  A one-time screening for abdominal aortic aneurysm (AAA) and surgical repair of large AAAs by ultrasound is recommended for men aged 29-75 years who are current or former smokers.  Stay current with your vaccines (immunizations).   This information is not intended to replace advice given to you by your health care provider. Make sure you discuss any questions you have with your health care provider.   Document Released: 04/01/2008 Document Revised: 10/25/2014 Document Reviewed: 03/01/2011 Elsevier Interactive Patient Education [employer].

## 2015-10-02 NOTE — Progress Notes (Signed)
Pre visit review using our clinic review tool, if applicable. No additional management support is needed unless otherwise documented below in the visit note. 

## 2015-10-03 LAB — HEPATITIS C ANTIBODY: HCV AB: NEGATIVE

## 2015-10-03 LAB — HIV ANTIBODY (ROUTINE TESTING W REFLEX): HIV: NONREACTIVE

## 2015-10-06 ENCOUNTER — Telehealth: Payer: Self-pay | Admitting: Internal Medicine

## 2015-10-06 NOTE — Telephone Encounter (Signed)
We can give it to him at his next appt, can you make a sticky note on his chart?

## 2015-10-06 NOTE — Telephone Encounter (Signed)
Pt came in to let [facility] know that he does not have documentation on when his last tetanus shot was. He thinks it was maybe in 2007. He wants to know if he is due for one. He said with his work schedule he could come in tomorrow 10/07/15. Best number to contact him is [phone].

## 2015-10-07 NOTE — Telephone Encounter (Signed)
Pt is aware and is okay with waiting until next appt

## 2015-11-10 ENCOUNTER — Telehealth: Payer: Self-pay | Admitting: Internal Medicine

## 2015-11-10 ENCOUNTER — Other Ambulatory Visit: Payer: Self-pay | Admitting: Internal Medicine

## 2015-11-10 MED ORDER — METFORMIN HCL 1000 MG PO TABS: 500.0000 mg | ORAL_TABLET | Freq: Two times a day (BID) | ORAL | Status: DC

## 2015-11-10 NOTE — Telephone Encounter (Signed)
Pt walked in and requested refill on metFORMIN (GLUCOPHAGE) 500 MG tablet  .  Would like as many refills as you allow.  Pharmacy of choice is [facility] outpatient pharmacy.  Pt's dosage was lowered to 500 MG at his last CPE 09/2015

## 2015-11-10 NOTE — Telephone Encounter (Signed)
Medication sent to pharmacy per request

## 2015-11-17 ENCOUNTER — Encounter: Payer: Self-pay | Admitting: Internal Medicine

## 2015-11-17 MED ORDER — METFORMIN HCL 1000 MG PO TABS: 500.0000 mg | ORAL_TABLET | Freq: Two times a day (BID) | ORAL | Status: DC

## 2015-11-17 MED FILL — metFORMIN HCL 1000 MG TABS: 1000 | 30 days supply | Qty: 30 | Fill #0

## 2015-12-18 ENCOUNTER — Other Ambulatory Visit: Payer: Self-pay

## 2015-12-18 VITALS — BP 120/82 | HR 68 | Resp 16 | Ht 72.0 in | Wt 231.6 lb

## 2015-12-18 DIAGNOSIS — E119 Type 2 diabetes mellitus without complications: Secondary | ICD-10-CM

## 2015-12-18 MED FILL — metFORMIN HCL 1000 MG TABS: 1000 | 30 days supply | Qty: 30 | Fill #1

## 2015-12-18 NOTE — Patient Instructions (Signed)
1. Plan to check blood sugar twice a day fasting and 1  to 2 hours after eating.  Goals of 80-130 before eating and after less 140-180.  2. Plan to do walk/exercise 3 times a week for 30-60 minutes. Plan to do weight training 2 times a week Plan to get 150 minutes a week. 3. Plan to lose  to 1 lb a week. Try using Lose it app 4. Plan to eat 4-5 (60-75gm) servings of carbohydrate per meal and 15gm for snacks.  Plan to increase protein with meals and snacks 5. Plan to return to [facility] on 03/18/16 at [place]

## 2015-12-18 NOTE — Patient Outreach (Signed)
[facility]) Care Management   12/18/2015  Marc Aguilar [birth year] [id number]  Marc Aguilar is an 55 y.o. male.   Member seen for follow up office visit for Link to Wellness program for self management of Type 2 diabetes  Subjective:  Member states that he is in the [employer] and he has been meeting all of his goals with the program.  States he saw his provider 10/03/15 and she decreased his Metformin to twice a day since his blood sugars have been lower and his hemoglobin A1C was 5%.  States he is still trying to lose weight and he has lost the weight he gained over the holidays.  States he is exercising on the treadmill 3 times a week for 30-60 minutes and doing weight training every other day.  States he had his eye exam and a dental check up.   Objective:   Review of Systems  All other systems reviewed and are negative.   Physical Exam Today's Vitals   12/18/15 1005  BP: 120/82  Pulse: 68  Resp: 16  Height: 1.829 m (6')  Weight: 231 lb 9.6 oz (105.053 kg)  SpO2: 98%  PainSc: 0-No pain   Current Medications:   Current Outpatient Prescriptions  Medication Sig Dispense Refill  . aspirin EC 81 MG tablet Take 81 mg by mouth daily.    Marland Kitchen glucose blood (TRUETEST TEST) test strip 1 each by Other route 2 (two) times daily. Use as instructed 100 each 6  . ibuprofen (ADVIL,MOTRIN) 800 MG tablet TAKE 1 TABLET (800 MG) BY MOUTH EVERY 8 HOURS AS NEEDED. 60 tablet 1  . metFORMIN (GLUCOPHAGE) 1000 MG tablet Take 0.5 tablets (500 mg total) by mouth 2 (two) times daily with a meal. 30 tablet 5  . Omega 3 1000 MG CAPS Take by mouth.    . TRUEPLUS LANCETS 30G MISC 1 each by Does not apply route 2 (two) times daily. Dx E11.9 100 each 6   No current facility-administered medications for this visit.    Functional Status:   In your present state of health, do you have any difficulty performing the following activities: 12/18/2015 09/18/2015  Hearing? N N  Vision? N N   Difficulty concentrating or making decisions? N N  Walking or climbing stairs? N N  Dressing or bathing? N -  Doing errands, shopping? N N    Fall/Depression Screening:    PHQ 2/9 Scores 12/18/2015 09/18/2015 03/13/2015 11/12/2014 10/25/2013  PHQ - 2 Score 0 0 0 0 0    Assessment:  Member seen for follow up office visit for [facility] to Wellness program for self management of Type 2 diabetes. Member is meeting diabetes self management goal of 6.5 or less with last result of  5%. Member is maintaining being active with activity tracker use and has added weight training to his exercise routine. Reports adherence with diet. Reports blood sugars ranging 77-120 fasting and 100-140 post prandial. Member had annual eye exam and dental cleaning.  Member is in [employer] and member introduced to [employer] nurse at [employer] to [employer].  Plan:  1. Plan to check blood sugar twice a day fasting and 1  to 2 hours after eating.  Goals of 80-130 before eating and after less 140-180.  2. Plan to do walk/exercise 3 times a week for 30-60 minutes. Plan to do weight training 2 times a week Plan to get 150 minutes a week. 3. Plan to lose  to 1 lb  a week. Try using Lose it app 4. Plan to eat 4-5 (60-75gm) servings of carbohydrate per meal and 15gm for snacks.  Plan to increase protein with meals and snacks 5. Plan to return to [facility] on 03/18/16 at [place] Problem One        Most Recent Value   Care Plan Problem One  Potential for elevated blood sugars related to dx of Type 2 DM   Role Documenting the Problem One  Care Management Coordinator   Care Plan for Problem One  Active   [facility]N Long Term Goal (31-90 days)  Member will maintain hemoglobin A1C at or below 6.5 for the next 90 days   THN Long Term Goal Start Date  12/18/15 [Last hemoglobin A1C 5]   Interventions for Problem One Long Term Goal  Reinforced CHO counting and portion control, given handout on Low CHO snacks, Encouraged to eat  more protein and less CHO,  Reinforced importance of regular exercise for glycemic control Encouraged to continue to be active in the  [place] program    [name] RN, [facility] Care Management Coordinator-Link to [place] Management [phone]

## 2016-01-26 ENCOUNTER — Other Ambulatory Visit: Payer: Self-pay | Admitting: Internal Medicine

## 2016-01-26 MED FILL — metFORMIN HCL 1000 MG TABS: 1000 | 30 days supply | Qty: 30 | Fill #2

## 2016-02-24 MED FILL — metFORMIN HCL 1000 MG TABS: 1000 | 30 days supply | Qty: 30 | Fill #3

## 2016-03-18 ENCOUNTER — Ambulatory Visit: Payer: 59

## 2016-04-01 DIAGNOSIS — L02412 Cutaneous abscess of left axilla: Secondary | ICD-10-CM | POA: Diagnosis not present

## 2016-04-02 ENCOUNTER — Encounter: Payer: Self-pay | Admitting: Internal Medicine

## 2016-04-03 DIAGNOSIS — M542 Cervicalgia: Secondary | ICD-10-CM | POA: Diagnosis not present

## 2016-04-03 DIAGNOSIS — L0291 Cutaneous abscess, unspecified: Secondary | ICD-10-CM | POA: Diagnosis not present

## 2016-04-03 DIAGNOSIS — R591 Generalized enlarged lymph nodes: Secondary | ICD-10-CM | POA: Diagnosis not present

## 2016-04-03 DIAGNOSIS — Z808 Family history of malignant neoplasm of other organs or systems: Secondary | ICD-10-CM | POA: Diagnosis not present

## 2016-04-03 DIAGNOSIS — Z7984 Long term (current) use of oral hypoglycemic drugs: Secondary | ICD-10-CM | POA: Diagnosis not present

## 2016-04-03 DIAGNOSIS — L02412 Cutaneous abscess of left axilla: Secondary | ICD-10-CM | POA: Diagnosis not present

## 2016-04-03 DIAGNOSIS — Z8052 Family history of malignant neoplasm of bladder: Secondary | ICD-10-CM | POA: Diagnosis not present

## 2016-04-03 DIAGNOSIS — E119 Type 2 diabetes mellitus without complications: Secondary | ICD-10-CM | POA: Diagnosis not present

## 2016-04-03 DIAGNOSIS — R51 Headache: Secondary | ICD-10-CM | POA: Diagnosis not present

## 2016-04-03 DIAGNOSIS — Z833 Family history of diabetes mellitus: Secondary | ICD-10-CM | POA: Diagnosis not present

## 2016-04-03 DIAGNOSIS — B9562 Methicillin resistant Staphylococcus aureus infection as the cause of diseases classified elsewhere: Secondary | ICD-10-CM | POA: Diagnosis not present

## 2016-04-14 ENCOUNTER — Other Ambulatory Visit: Payer: Self-pay

## 2016-04-14 NOTE — Patient Outreach (Signed)
[facility]) Care Management  04/14/2016  Marc Aguilar Oct 09, 1961 [id number]  Telephone call to member to follow up from report from [facility] nurse that member had reported being in the hospital.  Member not admitted to [facility] facility and no record of admission noted in medical record. Member states that he had an abscess under his arm that got worse and he was in the hospital for observation overnight at the local hospital in [place].  States he got IV antibiotics and went home on Sunday.  States he has completed his po antibiotics and the area is healing well.  States he has not made a follow up appt with his primary care provider.  States his blood sugars have been good. Patient was recently discharged from hospital and all medications have been reviewed. Instructed to call MD to schedule follow up appt. Reviewed s/s of infection to call MD Instructed member to keep appt with [facility] on 05/04/16 and to call as needed [name] RN, [facility] Care Management Coordinator-Link to [place] Management [phone]

## 2016-04-23 ENCOUNTER — Telehealth: Payer: Self-pay | Admitting: Internal Medicine

## 2016-04-23 NOTE — Telephone Encounter (Signed)
Marc Aguilar from [facility] called stating pt was last seen in 2012. Do you still want medical records

## 2016-04-27 ENCOUNTER — Ambulatory Visit: Payer: Self-pay | Admitting: Internal Medicine

## 2016-04-27 ENCOUNTER — Ambulatory Visit (INDEPENDENT_AMBULATORY_CARE_PROVIDER_SITE_OTHER): Payer: 59 | Admitting: Family Medicine

## 2016-04-27 ENCOUNTER — Encounter: Payer: Self-pay | Admitting: Family Medicine

## 2016-04-27 VITALS — BP 126/82 | HR 77 | Temp 97.7°F | Wt 237.0 lb

## 2016-04-27 DIAGNOSIS — L02419 Cutaneous abscess of limb, unspecified: Secondary | ICD-10-CM | POA: Diagnosis not present

## 2016-04-27 NOTE — Assessment & Plan Note (Signed)
Resolved, almost one month since he was admitted for this issue. Reassurance provided. Will request records. The patient indicates understanding of these issues and agrees with the plan.

## 2016-04-27 NOTE — Progress Notes (Signed)
Pre visit review using our clinic review tool, if applicable. No additional management support is needed unless otherwise documented below in the visit note. 

## 2016-04-27 NOTE — Progress Notes (Signed)
Subjective:   Patient ID: Marc Aguilar, male    DOB: Sep 10, 1961, 55 y.o.   MRN: [id number]  Marc Aguilar is a pleasant 55 y.o. year old male pt of [facility] Silversmith, new to me, who presents to clinic today with Hospitalization Follow-up  on 04/27/2016  HPI:  We do not have hospital records- was admitted to [facility].  Per pt, on 04/01/16, went to UC and diagnosed with painful, left axillary abscess.  I and D was performed and placed on oral Bactrim.  Followed up 2 days later and symptoms were worse so admitted to [facility] overnight and given IV vancomycin.  It has been almost a month since he was discharged and is feeling fine.  Was told to follow up with PCP so he came in today.  Current Outpatient Prescriptions on File Prior to Visit  Medication Sig Dispense Refill  . aspirin EC 81 MG tablet Take 81 mg by mouth daily.    Marland Kitchen glucose blood (TRUETEST TEST) test strip 1 each by Other route 2 (two) times daily. Use as instructed 100 each 6  . ibuprofen (ADVIL,MOTRIN) 800 MG tablet TAKE 1 TABLET (800 MG) BY MOUTH EVERY 8 HOURS AS NEEDED. 60 tablet 1  . metFORMIN (GLUCOPHAGE) 1000 MG tablet TAKE 1 TABLET BY MOUTH 2 TIMES DAILY WITH A MEAL. 60 tablet 5  . Omega 3 1000 MG CAPS Take by mouth.    . TRUEPLUS LANCETS 30G MISC 1 each by Does not apply route 2 (two) times daily. Dx E11.9 100 each 6   No current facility-administered medications on file prior to visit.    No Known Allergies  Past Medical History  Diagnosis Date  . GERD (gastroesophageal reflux disease)   . Heart murmur   . Chicken pox   . Diabetes mellitus without complication ([place])   . Allergy   . Sleep apnea     does not use cpap    Past Surgical History  Procedure Laterality Date  . Wrist surgery Left 1984    cyst removal    Family History  Problem Relation Age of Onset  . Diabetes Mother   . Hyperlipidemia Mother   . Bladder Cancer Father   . Liver cancer Sister   . Diabetes Sister   .  Diabetes Maternal Aunt   . Arthritis Maternal Grandmother   . Diabetes Maternal Grandmother   . Arthritis Maternal Grandfather   . Arthritis Paternal Grandmother   . Colon cancer Neg Hx   . Diabetes Brother   . Heart disease Brother     Social History   Social History  . Marital Status: Married    Spouse Name: N/A  . Number of Children: 2  . Years of Education: N/A   Occupational History  . [employer] - EMT    Social History Main Topics  . Smoking status: Former Smoker    Quit date: 10/18/2009  . Smokeless tobacco: Former Systems developer    Types: Chew    Quit date: 10/18/1994  . Alcohol Use: 3.0 - 3.6 oz/week    1-2 Glasses of wine, 3 Cans of beer, 1 Shots of liquor per week     Comment: OCC.  . Drug Use: No  . Sexual Activity: Yes   Other Topics Concern  . Not on file   Social History Narrative   The PMH, PSH, Social History, Family History, Medications, and allergies have been reviewed in [facility], and have been updated if relevant.  Review of Systems  Constitutional: Negative.   HENT: Negative.   Eyes: Negative.   Respiratory: Negative.   Cardiovascular: Negative.   Endocrine: Negative.   Genitourinary: Negative.   Musculoskeletal: Negative.   Skin: Negative.   Allergic/Immunologic: Negative.   Neurological: Negative.   Hematological: Negative.   Psychiatric/Behavioral: Negative.   All other systems reviewed and are negative.      Objective:    BP 126/82 mmHg  Pulse 77  Temp(Src) 97.7 F (36.5 C) (Oral)  Wt 237 lb (107.502 kg)  SpO2 97%   Physical Exam  Constitutional: He is oriented to person, place, and time. He appears well-developed and well-nourished. No distress.  HENT:  Head: Normocephalic.  Eyes: Conjunctivae are normal.  Cardiovascular: Normal rate.   Pulmonary/Chest: Effort normal.  Musculoskeletal: Normal range of motion.  Neurological: He is alert and oriented to person, place, and time. No cranial nerve deficit.  Skin: Skin is warm and dry. He  is not diaphoretic.  Psychiatric: He has a normal mood and affect. His behavior is normal. Judgment and thought content normal.  Nursing note and vitals reviewed.         Assessment & Plan:   Abscess of axillary region No Follow-up on file.

## 2016-05-04 ENCOUNTER — Other Ambulatory Visit: Payer: Self-pay

## 2016-05-04 VITALS — BP 122/70 | HR 84 | Resp 16 | Ht 72.0 in | Wt 234.8 lb

## 2016-05-04 DIAGNOSIS — E119 Type 2 diabetes mellitus without complications: Secondary | ICD-10-CM

## 2016-05-04 LAB — POCT GLYCOSYLATED HEMOGLOBIN (HGB A1C): Hemoglobin A1C: 5

## 2016-05-04 NOTE — Patient Outreach (Signed)
[facility]) Care Management   05/04/2016  PHALEN BONNAR 09-07-1961 [id number]  TATEN JAFFEE is an 55 y.o. male.   Member seen for follow up office visit for [facility] program for self management of Type 2 diabetes  Subjective: Member states that his [facility] program is coming to an end.  States he has been watching his diet and exercising 3 times a week.  States that he is recovered from his abscess that he had in his lt underarm.  States that his blood sugars have been staying in range and they did not become elevated when he had the infection.    Objective:   Review of Systems  All other systems reviewed and are negative. POC HemoglobinA1C- 5%  Physical Exam Today's Vitals   05/04/16 0837  BP: 122/70  Pulse: 84  Resp: 16  Height: 1.829 m (6')  Weight: 234 lb 12.8 oz (106.505 kg)  SpO2: 96%  PainSc: 0-No pain   Encounter Medications:   Outpatient Encounter Prescriptions as of 05/04/2016  Medication Sig Note  . aspirin EC 81 MG tablet Take 81 mg by mouth daily.   Marland Kitchen glucose blood (TRUETEST TEST) test strip 1 each by Other route 2 (two) times daily. Use as instructed   . ibuprofen (ADVIL,MOTRIN) 800 MG tablet TAKE 1 TABLET (800 MG) BY MOUTH EVERY 8 HOURS AS NEEDED.   . metFORMIN (GLUCOPHAGE) 1000 MG tablet TAKE 1 TABLET BY MOUTH 2 TIMES DAILY WITH A MEAL.   . Multiple Vitamins-Minerals (MULTIVITAMIN WITH MINERALS) tablet Take 1 tablet by mouth daily.   . Omega 3 1000 MG CAPS Take by mouth. 03/25/2014: Received from: [place]  . TRUEPLUS LANCETS 30G MISC 1 each by Does not apply route 2 (two) times daily. Dx E11.9    No facility-administered encounter medications on file as of 05/04/2016.    Functional Status:   In your present state of health, do you have any difficulty performing the following activities: 05/04/2016 12/18/2015  Hearing? N N  Vision? N N  Difficulty concentrating or making decisions? N N  Walking or climbing  stairs? N N  Dressing or bathing? N N  Doing errands, shopping? N N    Fall/Depression Screening:    PHQ 2/9 Scores 05/04/2016 12/18/2015 09/18/2015 03/13/2015 11/12/2014 10/25/2013  PHQ - 2 Score 0 0 0 0 0 0    Assessment:  Member seen for follow up office visit for Link to Wellness program for self management of Type 2 diabetes. Member is meeting diabetes self management goal of 6.5% or less with result of 5% today. Member is maintaining being active with activity tracker use and has added weight training to his exercise routine. Reports adherence with diet. Reports blood sugars ranging 70-115 fasting and 100-140 post prandial. Member had annual eye exam and dental cleaning.   Plan:   Plan to check blood sugar twice a day fasting and 1  to 2 hours after eating.  Goals of 80-130 before eating and after less 140-180.  Plan to do walk/exercise 3 times a week for 30-60 minutes. Plan to do weight training 3 times a week Plan to get 150 minutes a week. Plan to lose  to 1 lb a week. Plan to continue using the  Lose it app Plan to eat 4-5 (60-75gm) servings of carbohydrate per meal and 15gm for snacks.  Plan to continue eating protein with meals and snacks Plan to complete EMMI programs by 07/18/16 Plan to  return to Link to Wellness on 08/10/16 at 8:30AM  [place] Problem One        Most Recent Value   Care Plan Problem One  Potential for elevated blood sugars related to dx of Type 2 DM   Role Documenting the Problem One  Care Management Coordinator   Care Plan for Problem One  Active   THN Long Term Goal (31-90 days)  Member will maintain hemoglobin A1C at or below 6.5 for the next 90 days   THN Long Term Goal Start Date  05/04/16   Interventions for Problem One Long Term Goal  Reinforced CHO counting and portion control, Encouraged to continue to cook healthy meals for dinner and to avoid fast foods, Encouraged to eat more protein and less CHO,Reviewed actions of medications,   Reinforced importance of regular exercise for glycemic control      [name] RN, [facility] Care Management Coordinator-Link to [place] Management [phone]

## 2016-05-04 NOTE — Patient Instructions (Addendum)
1. Plan to check blood sugar twice a day fasting and 1  to 2 hours after eating.  Goals of 80-130 before eating and after less 140-180.  2. Plan to do walk/exercise 3 times a week for 30-60 minutes. Plan to do weight training 3 times a week Plan to get 150 minutes a week. 3. Plan to lose  to 1 lb a week. Plan to continue using the  Lose it app 4. Plan to eat 4-5 (60-75gm) servings of carbohydrate per meal and 15gm for snacks.  Plan to continue eating protein with meals and snacks 5. Plan to complete EMMI programs by 07/18/16 6. Plan to return to Link to Wellness on 08/10/16 at 8:30AM

## 2016-05-25 MED FILL — metFORMIN HCL 1000 MG TABS: 1000 | 30 days supply | Qty: 30 | Fill #4

## 2016-06-14 MED FILL — metFORMIN HCL 1000 MG TABS: 1000 | 30 days supply | Qty: 30 | Fill #5

## 2016-07-30 ENCOUNTER — Other Ambulatory Visit: Payer: Self-pay | Admitting: Internal Medicine

## 2016-08-02 MED FILL — metFORMIN HCL 1000 MG TABS: 1000 | 30 days supply | Qty: 60 | Fill #0

## 2016-08-10 ENCOUNTER — Other Ambulatory Visit: Payer: Self-pay

## 2016-08-10 VITALS — BP 142/86 | HR 76 | Resp 14 | Ht 72.0 in | Wt 241.0 lb

## 2016-08-10 DIAGNOSIS — E119 Type 2 diabetes mellitus without complications: Secondary | ICD-10-CM

## 2016-08-10 NOTE — Patient Outreach (Signed)
[facility]) Care Management   08/10/2016  Marc Aguilar 10-07-1961 [id number]  Marc Aguilar is an 55 y.o. male.   Member seen for follow up office visit for Link to Wellness program for self management of Type 2 diabetes  Subjective: member states that he has been having some stress with trying to buy a house and dealing with some family issues.  States his blood sugars have been good.  States that he completed the [facility] trial and he is looking forward to being in the program next year.  States he is trying to watch what he eats but it has been more difficult while he is moving.  States he is walking daily and he reaches his step goal of 11,000 steps most days.  States he is due to see his provider in December  Objective:   Review of Systems  All other systems reviewed and are negative.   Physical Exam Today's Vitals   08/10/16 0841 08/10/16 0851  BP: (!) 142/86   Pulse: 76   Resp: 14   SpO2: 96%   Weight: 241 lb (109.3 kg)   Height: 1.829 m (6')   PainSc: 0-No pain 0-No pain   Encounter Medications:   Outpatient Encounter Prescriptions as of 08/10/2016  Medication Sig Note  . aspirin EC 81 MG tablet Take 81 mg by mouth daily.   Marland Kitchen glucose blood (TRUETEST TEST) test strip 1 each by Other route 2 (two) times daily. Use as instructed   . ibuprofen (ADVIL,MOTRIN) 800 MG tablet TAKE 1 TABLET (800 MG) BY MOUTH EVERY 8 HOURS AS NEEDED.   . metFORMIN (GLUCOPHAGE) 1000 MG tablet TAKE 1 TABLET BY MOUTH 2 TIMES DAILY WITH A MEAL.   . metFORMIN (GLUCOPHAGE) 1000 MG tablet Take 1 tablet (1,000 mg total) by mouth 2 (two) times daily with a meal.   . Multiple Vitamins-Minerals (MULTIVITAMIN WITH MINERALS) tablet Take 1 tablet by mouth daily.   . Omega 3 1000 MG CAPS Take by mouth. 03/25/2014: Received from: [place]  . TRUEPLUS LANCETS 30G MISC 1 each by Does not apply route 2 (two) times daily. Dx E11.9    No facility-administered encounter  medications on file as of 08/10/2016.     Functional Status:   In your present state of health, do you have any difficulty performing the following activities: 08/10/2016 05/04/2016  Hearing? N N  Vision? N N  Difficulty concentrating or making decisions? N N  Walking or climbing stairs? N N  Dressing or bathing? N N  Doing errands, shopping? N N  Some recent data might be hidden    Fall/Depression Screening:    PHQ 2/9 Scores 08/10/2016 05/04/2016 12/18/2015 09/18/2015 03/13/2015 11/12/2014 10/25/2013  PHQ - 2 Score 0 0 0 0 0 0 0    Assessment:  Member seen for follow up office visit for Link to Wellness program for self management of Type 2 diabetes. Member is meeting diabetes self management goal of 6.5% or less with last result of 5%. Member is maintaining being active with activity tracker use and continues weight training to his exercise routine. Reports adherence with diet. Reports blood sugars ranging 80-118 fasting and 115-135 post prandial. Member up to date for  annual eye exam and dental cleaning.  Member has had more stress with family issues, house buying and moving.  Plan:   Plan to check blood sugar twice a day fasting and 1  to 2 hours after eating.  Goals  of 80-130 before eating and after less 140-180.  Plan to do walk/exercise 3 times a week for 30-60 minutes. Plan to do weight training 3 times a week Plan to get 150 minutes a week. Plan to eat 4-5 (60-75gm) servings of carbohydrate per meal and 15gm for snacks.  Plan to continue eating protein with meals and snacks Plan to enroll in [place] to call provider to schedule physical in December Plan to follow through the [place] program in 2018  [place] Problem One   Flowsheet Row Most Recent Value  Care Plan Problem One  Potential for elevated blood sugars related to dx of Type 2 DM  Role Documenting the Problem One  Care Management [place] for Problem One  Active  THN Long Term Goal  (31-90 days)  Member will maintain hemoglobin A1C at or below 6.5 for the next 90 days  THN Long Term Goal Start Date  08/10/16  Interventions for Problem One Long Term Goal  Given handout on employee assistance counseling program and instructed how stress can effect his blood sugars, Given written information on the [facility] program and instructed to enroll in the program, Reinforced CHO counting and portion control, Encouraged to eat more protein and less CHO, Reinforced importance of regular exercise for glycemic control     [name] RN, [facility] Care Management Coordinator-Link to [place] Management [phone]

## 2016-08-10 NOTE — Patient Instructions (Signed)
1. Plan to check blood sugar twice a day fasting and 1  to 2 hours after eating.  Goals of 80-130 before eating and after less 140-180.  2. Plan to do walk/exercise 3 times a week for 30-60 minutes. Plan to do weight training 3 times a week Plan to get 150 minutes a week. 3. Plan to eat 4-5 (60-75gm) servings of carbohydrate per meal and 15gm for snacks.  Plan to continue eating protein with meals and snacks 4. Plan to enroll in [facility] 5. Plan to call provider to schedule physical in December 6. Plan to follow through the [facility] program in 2018

## 2016-09-13 MED FILL — metFORMIN HCL 1000 MG TABS: 1000 | 30 days supply | Qty: 60 | Fill #1

## 2016-09-25 LAB — HM DIABETES EYE EXAM

## 2016-10-12 DIAGNOSIS — E119 Type 2 diabetes mellitus without complications: Secondary | ICD-10-CM | POA: Diagnosis not present

## 2016-10-12 DIAGNOSIS — H527 Unspecified disorder of refraction: Secondary | ICD-10-CM | POA: Diagnosis not present

## 2016-10-12 DIAGNOSIS — H40013 Open angle with borderline findings, low risk, bilateral: Secondary | ICD-10-CM | POA: Diagnosis not present

## 2016-10-18 MED FILL — metFORMIN HCL 1000 MG TABS: 1000 | 30 days supply | Qty: 60 | Fill #2

## 2016-10-26 ENCOUNTER — Ambulatory Visit (INDEPENDENT_AMBULATORY_CARE_PROVIDER_SITE_OTHER): Payer: 59 | Admitting: Internal Medicine

## 2016-10-26 ENCOUNTER — Encounter: Payer: Self-pay | Admitting: Internal Medicine

## 2016-10-26 VITALS — BP 118/74 | HR 82 | Temp 98.4°F | Ht 70.0 in | Wt 238.0 lb

## 2016-10-26 DIAGNOSIS — J302 Other seasonal allergic rhinitis: Secondary | ICD-10-CM

## 2016-10-26 DIAGNOSIS — Z125 Encounter for screening for malignant neoplasm of prostate: Secondary | ICD-10-CM | POA: Diagnosis not present

## 2016-10-26 DIAGNOSIS — E7800 Pure hypercholesterolemia, unspecified: Secondary | ICD-10-CM

## 2016-10-26 DIAGNOSIS — K219 Gastro-esophageal reflux disease without esophagitis: Secondary | ICD-10-CM | POA: Diagnosis not present

## 2016-10-26 DIAGNOSIS — Z0000 Encounter for general adult medical examination without abnormal findings: Secondary | ICD-10-CM

## 2016-10-26 DIAGNOSIS — Z23 Encounter for immunization: Secondary | ICD-10-CM

## 2016-10-26 DIAGNOSIS — N401 Enlarged prostate with lower urinary tract symptoms: Secondary | ICD-10-CM | POA: Diagnosis not present

## 2016-10-26 DIAGNOSIS — R0982 Postnasal drip: Secondary | ICD-10-CM

## 2016-10-26 DIAGNOSIS — R519 Headache, unspecified: Secondary | ICD-10-CM

## 2016-10-26 DIAGNOSIS — R350 Frequency of micturition: Secondary | ICD-10-CM

## 2016-10-26 DIAGNOSIS — R51 Headache: Secondary | ICD-10-CM

## 2016-10-26 DIAGNOSIS — G4733 Obstructive sleep apnea (adult) (pediatric): Secondary | ICD-10-CM

## 2016-10-26 DIAGNOSIS — E119 Type 2 diabetes mellitus without complications: Secondary | ICD-10-CM

## 2016-10-26 LAB — COMPREHENSIVE METABOLIC PANEL
ALT: 18 U/L (ref 0–53)
AST: 21 U/L (ref 0–37)
Albumin: 4.4 g/dL (ref 3.5–5.2)
Alkaline Phosphatase: 60 U/L (ref 39–117)
BUN: 23 mg/dL (ref 6–23)
CALCIUM: 9.7 mg/dL (ref 8.4–10.5)
CHLORIDE: 104 meq/L (ref 96–112)
CO2: 27 meq/L (ref 19–32)
CREATININE: 1.02 mg/dL (ref 0.40–1.50)
GFR: 98.1 mL/min (ref >60.00)
Glucose, Bld: 86 mg/dL (ref 70–99)
Potassium: 4.3 mEq/L (ref 3.5–5.1)
SODIUM: 139 meq/L (ref 135–145)
Total Bilirubin: 0.6 mg/dL (ref 0.2–1.2)
Total Protein: 7.3 g/dL (ref 6.0–8.3)

## 2016-10-26 LAB — CBC
HCT: 42.8 % (ref 39.0–52.0)
Hemoglobin: 14.3 g/dL (ref 13.0–17.0)
MCHC: 33.4 g/dL (ref 30.0–36.0)
MCV: 78.1 fl (ref 78.0–100.0)
PLATELETS: 223 10*3/uL (ref 150.0–400.0)
RBC: 5.49 Mil/uL (ref 4.22–5.81)
RDW: 15.4 % (ref 11.5–15.5)
WBC: 5 10*3/uL (ref 4.0–10.5)

## 2016-10-26 LAB — LIPID PANEL
Cholesterol: 201 mg/dL — ABNORMAL HIGH (ref 0–200)
HDL: 40.6 mg/dL (ref >39.00)
LDL CALC: 139 mg/dL — AB (ref 0–99)
NonHDL: 160.1
Total CHOL/HDL Ratio: 5
Triglycerides: 106 mg/dL (ref 0.0–149.0)
VLDL: 21.2 mg/dL (ref 0.0–40.0)

## 2016-10-26 LAB — MICROALBUMIN / CREATININE URINE RATIO
Creatinine,U: 202.9 mg/dL
Microalb Creat Ratio: 0.3 mg/g (ref 0.0–30.0)
Microalb, Ur: <0.7 mg/dL (ref 0.0–1.9)

## 2016-10-26 LAB — PSA: PSA: 2.41 ng/mL (ref 0.10–4.00)

## 2016-10-26 MED ORDER — TAMSULOSIN HCL 0.4 MG PO CAPS: 0.4000 mg | ORAL_CAPSULE | Freq: Every day | ORAL | 2 refills | Status: DC

## 2016-10-26 MED FILL — TAMSULOSIN HCL 0.4 MG CAP: 0.4 | 30 days supply | Qty: 30 | Fill #0

## 2016-10-26 NOTE — Assessment & Plan Note (Signed)
Avoid triggers Continue antihistamine OTC prn

## 2016-10-26 NOTE — Patient Instructions (Signed)

## 2016-10-26 NOTE — Assessment & Plan Note (Signed)
Deteriorated Referral to pulmonology for repeat sleep study and further treatment Discussed how treating his sinus issues and weight loss may help his OSA

## 2016-10-26 NOTE — Progress Notes (Signed)
Subjective:    Patient ID: Marc Aguilar, male    DOB: 11-08-1960, 56 y.o.   MRN: [id number]  HPI  Pt presents to the clinic today for his annual exam. He is also due for follow up of chronic conditions.  Dm 2: His last A1C was 5%, 02/2016. His blood sugar ranges 65-120. He is taking Metformin as prescribed.  His last eye exam was 09/2016. Flu 06/2016. Pneumovax 07/2014.  HLD: His last LDL was 128, 09/2015. He refused to take cholesterol lowering medication in the past but does take Fish Oil daily.  GERD: Triggered by greasy foods. His symptoms are diet controlled but he will rarely take a Tums OTC.  OSA: He has a CPAP machine but does not use it consistently. He is feeling more fatigued during the day. His wife reports he snoring bad at night. He is unsure when his last sleep study was, > 5 years ago. He is also not sure if his machine is working or not.  Seasonal Allergies: Worse in the spring. He will take an antihistamine OTC if his symptoms get really bad.  Flu: 06/2016 Tetanus: > 10 years ago Pneumovax: 07/2014 PSA: 08/2015 Colon Screening: 03/2014 Vision Screening: yearly, 09/2016 at [facility] and Ear Dentist: biannually  Diet: He does eat meat. He consumes fruits and veggies daily. He tries to avoid fried foods. He drinks coffee, water and soda. Exercise: He is walking for at least 30 minutes 3 days a week.   Review of Systems      Past Medical History:  Diagnosis Date  . Allergy   . Chicken pox   . Diabetes mellitus without complication ([place])   . GERD (gastroesophageal reflux disease)   . Heart murmur   . Sleep apnea    does not use cpap    Current Outpatient Prescriptions  Medication Sig Dispense Refill  . aspirin EC 81 MG tablet Take 81 mg by mouth daily.    Marland Kitchen glucose blood (TRUETEST TEST) test strip 1 each by Other route 2 (two) times daily. Use as instructed 100 each 6  . ibuprofen (ADVIL,MOTRIN) 800 MG tablet TAKE 1 TABLET (800 MG) BY MOUTH EVERY 8 HOURS AS  NEEDED. 60 tablet 1  . metFORMIN (GLUCOPHAGE) 1000 MG tablet TAKE 1 TABLET BY MOUTH 2 TIMES DAILY WITH A MEAL. 60 tablet 5  . metFORMIN (GLUCOPHAGE) 1000 MG tablet Take 1 tablet (1,000 mg total) by mouth 2 (two) times daily with a meal. 30 tablet 3  . Multiple Vitamins-Minerals (MULTIVITAMIN WITH MINERALS) tablet Take 1 tablet by mouth daily.    . Omega 3 1000 MG CAPS Take by mouth.    . TRUEPLUS LANCETS 30G MISC 1 each by Does not apply route 2 (two) times daily. Dx E11.9 100 each 6   No current facility-administered medications for this visit.     Allergies  Allergen Reactions  . Tape Rash    Paper tape-blisters    Family History  Problem Relation Age of Onset  . Diabetes Mother   . Hyperlipidemia Mother   . Bladder Cancer Father   . Liver cancer Sister   . Diabetes Sister   . Diabetes Maternal Aunt   . Arthritis Maternal Grandmother   . Diabetes Maternal Grandmother   . Arthritis Maternal Grandfather   . Arthritis Paternal Grandmother   . Colon cancer Neg Hx   . Diabetes Brother   . Heart disease Brother     Social History   Social History  .  Marital status: Married    Spouse name: N/A  . Number of children: 2  . Years of education: N/A   Occupational History  . [employer] - EMT    Social History Main Topics  . Smoking status: Former Smoker    Quit date: 10/18/2009  . Smokeless tobacco: Former Systems developer    Types: Chew    Quit date: 10/18/1994  . Alcohol use 3.0 - 3.6 oz/week    1 - 2 Glasses of wine, 3 Cans of beer, 1 Shots of liquor per week     Comment: OCC.  . Drug use: No  . Sexual activity: Yes   Other Topics Concern  . Not on file   Social History Narrative  . No narrative on file     Constitutional: Pt reports intermittent sinus headaches. Denies fever, malaise, fatigue, headache or abrupt weight changes.  HEENT: Pt reports post nasal drip. Denies eye pain, eye redness, ear pain, ringing in the ears, wax buildup, runny nose, nasal congestion, bloody nose,  or sore throat. Respiratory: Denies difficulty breathing, shortness of breath, cough or sputum production.   Cardiovascular: Denies chest pain, chest tightness, palpitations or swelling in the hands or feet.  Gastrointestinal: Denies abdominal pain, bloating, constipation, diarrhea or blood in the stool.  GU: Pt reports urinary frequency. Denies urgency, pain with urination, burning sensation, blood in urine, odor or discharge. Musculoskeletal: Denies decrease in range of motion, difficulty with gait, muscle pain or joint pain and swelling.  Skin: Denies redness, rashes, lesions or ulcercations.  Neurological: Denies dizziness, difficulty with memory, difficulty with speech or problems with balance and coordination.  Psych: Denies anxiety, depression, SI/HI.  No other specific complaints in a complete review of systems (except as listed in HPI above).  Objective:   Physical Exam   BP 118/74   Pulse 82   Temp 98.4 F (36.9 C) (Oral)   Ht 5\' 10"  (1.778 m)   Wt 238 lb (108 kg)   SpO2 97%   BMI 34.15 kg/m  Wt Readings from Last 3 Encounters:  10/26/16 238 lb (108 kg)  08/10/16 241 lb (109.3 kg)  05/04/16 234 lb 12.8 oz (106.5 kg)    General: Appears his stated age, well developed, well nourished in NAD. Skin: Warm, dry and intact.  HEENT: Head: normal shape and size; Eyes: sclera white, no icterus, conjunctiva pink, PERRLA and EOMs intact; Ears: Tm's gray and intact, normal light reflex; Throat/Mouth: Teeth present, mucosa pink and moist,+ PND, no exudate, lesions or ulcerations noted.  Neck:  Neck supple, trachea midline. No masses, lumps or thyromegaly present.  Cardiovascular: Normal rate and rhythm. S1,S2 noted.  No murmur, rubs or gallops noted. No JVD or BLE edema. No carotid bruits noted. Pulmonary/Chest: Normal effort and positive vesicular breath sounds. No respiratory distress. No wheezes, rales or ronchi noted.  Abdomen: Soft and nontender. Normal bowel sounds. No  distention or masses noted. Liver, spleen and kidneys non palpable. Rectal: Normal rectal tone. Prostate slightly enlarged but smooth. Musculoskeletal: Normal range of motion. Strength 5/5 BUE/BLE. No difficulty with gait.  Neurological: Alert and oriented. Cranial nerves II-XII grossly intact. Coordination normal.  Psychiatric: Mood and affect normal. Behavior is normal. Judgment and thought content normal.    BMET    Component Value Date/Time   NA 138 10/02/2015 0854   K 4.4 10/02/2015 0854   CL 104 10/02/2015 0854   CO2 29 10/02/2015 0854   GLUCOSE 103 (H) 10/02/2015 0854   BUN 18 10/02/2015  0854   CREATININE 1.00 10/02/2015 0854   CALCIUM 9.5 10/02/2015 0854    Lipid Panel     Component Value Date/Time   CHOL 196 10/02/2015 0854   TRIG 125.0 10/02/2015 0854   HDL 42.60 10/02/2015 0854   CHOLHDL 5 10/02/2015 0854   VLDL 25.0 10/02/2015 0854   LDLCALC 128 (H) 10/02/2015 0854    CBC    Component Value Date/Time   WBC 5.1 10/02/2015 0854   RBC 5.82 (H) 10/02/2015 0854   HGB 14.9 10/02/2015 0854   HCT 45.5 10/02/2015 0854   PLT 232.0 10/02/2015 0854   MCV 78.3 10/02/2015 0854   MCHC 32.8 10/02/2015 0854   RDW 14.8 10/02/2015 0854   LYMPHSABS 2.2 03/13/2015 1355   MONOABS 0.4 03/13/2015 1355   EOSABS 0.2 03/13/2015 1355   BASOSABS 0.0 03/13/2015 1355    Hgb A1C Lab Results  Component Value Date   HGBA1C 5 05/04/2016           Assessment & Plan:   Preventative Health Maintenance:  Flu and pneumovax UTD Tdap today PSA will be done with labs Colon Screening UTD Encouraged him to consume a balanced diet and exercise regimen Advised him to see an eye doctor and dentist annually Will check CBC, CMET, LIpid, PSA and urine microalbumin today  Sinus Headaches and PND:  Start Allegra and Flonase OTC  RTC in 1 year, sooner if needed Webb Silversmith, NP

## 2016-10-26 NOTE — Assessment & Plan Note (Signed)
CMET and lipid profile today Encouraged him to consume a low fat diet If LDL not at goal, will discuss statin therapy

## 2016-10-26 NOTE — Assessment & Plan Note (Signed)
Discussed avoiding his triggers Continue Tums prn

## 2016-10-26 NOTE — Assessment & Plan Note (Signed)
Controlled He is not interested in weaning back on Metformin at this time Flu and pneumovax UTD Eye exam UTD Foot exam today Microalbumin done today Encouraged him to consume a low carb, low fat diet and exercise to lose weight

## 2016-10-26 NOTE — Assessment & Plan Note (Signed)
Will start Flomax today, eRx sent to pharmacy He will follow up with me in 4 weeks to let me know if this is working for him

## 2016-10-28 ENCOUNTER — Encounter: Payer: Self-pay | Admitting: Internal Medicine

## 2016-11-01 NOTE — Addendum Note (Signed)
Addended by: Lurlean Nanny on: 11/01/2016 05:52 PM   Modules accepted: Orders

## 2016-11-02 ENCOUNTER — Ambulatory Visit: Payer: 59 | Admitting: Internal Medicine

## 2016-11-09 DIAGNOSIS — B349 Viral infection, unspecified: Secondary | ICD-10-CM | POA: Diagnosis not present

## 2016-11-12 DIAGNOSIS — J180 Bronchopneumonia, unspecified organism: Secondary | ICD-10-CM | POA: Diagnosis not present

## 2016-11-20 ENCOUNTER — Encounter (HOSPITAL_COMMUNITY): Payer: Self-pay | Admitting: *Deleted

## 2016-11-20 ENCOUNTER — Ambulatory Visit (INDEPENDENT_AMBULATORY_CARE_PROVIDER_SITE_OTHER): Payer: 59

## 2016-11-20 ENCOUNTER — Ambulatory Visit (HOSPITAL_COMMUNITY)
Admission: EM | Admit: 2016-11-20 | Discharge: 2016-11-20 | Disposition: A | Discharge status: Home / Self Care | Payer: 59 | Attending: Family Medicine | Admitting: Family Medicine

## 2016-11-20 DIAGNOSIS — J40 Bronchitis, not specified as acute or chronic: Secondary | ICD-10-CM | POA: Diagnosis not present

## 2016-11-20 DIAGNOSIS — J189 Pneumonia, unspecified organism: Secondary | ICD-10-CM | POA: Diagnosis not present

## 2016-11-20 HISTORY — DX: Pneumonia, unspecified organism: J18.9

## 2016-11-20 NOTE — ED Provider Notes (Signed)
[place]    CSN: [id number] Arrival date & time: 11/20/16  1212     History   Chief Complaint Chief Complaint  Patient presents with  . Follow-up    HPI Marc Aguilar is a 56 y.o. male.   This a 56 year old man who was diagnosed with pneumonia on 11/09/2016. He was given doxycycline and prednisone and has improved. He still has some chest tightness. He needs a note to clear him for work.  Patient has not had a cough. He's had no fever since the initial symptoms of the "flu".  Patient was treated with prednisone, doxycycline, and an inhaler. He has not used the inhaler however.      Past Medical History:  Diagnosis Date  . Allergy   . Chicken pox   . Diabetes mellitus without complication ([place])   . GERD (gastroesophageal reflux disease)   . Heart murmur   . Pneumonia   . Sleep apnea    does not use cpap    Patient Active Problem List   Diagnosis Date Noted  . Benign prostatic hyperplasia with urinary frequency 10/26/2016  . HLD (hyperlipidemia) 10/02/2015  . OSA (obstructive sleep apnea) 03/13/2015  . Seasonal allergies 03/13/2015  . GERD (gastroesophageal reflux disease) 03/13/2015  . 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  . Varicose veins of lower extremities with other complications [id number]  . DM type 2 (diabetes mellitus, type 2) ([place]) 10/30/2013    Past Surgical History:  Procedure Laterality Date  . WRIST SURGERY Left 1984   cyst removal       Home Medications    Prior to Admission medications   Medication Sig Start Date End Date Taking? Authorizing Provider  aspirin EC 81 MG tablet Take 81 mg by mouth daily.   Yes Historical Provider, MD  benzonatate (TESSALON) 100 MG capsule Take by mouth 3 (three) times daily as needed for cough.   Yes Historical Provider, MD  DOXYCYCLINE HYCLATE PO Take by mouth.   Yes Historical Provider, MD  Fexofenadine HCl (ALLEGRA PO) Take by mouth.   Yes Historical Provider, MD  glucose blood  (TRUETEST TEST) test strip 1 each by Other route 2 (two) times daily. Use as instructed 03/13/15  Yes [name], NP  ibuprofen (ADVIL,MOTRIN) 800 MG tablet TAKE 1 TABLET (800 MG) BY MOUTH EVERY 8 HOURS AS NEEDED. 12/23/14  Yes [name], NP  metFORMIN (GLUCOPHAGE) 1000 MG tablet Take 1 tablet (1,000 mg total) by mouth 2 (two) times daily with a meal. 07/30/16  Yes [name], NP  Multiple Vitamins-Minerals (MULTIVITAMIN WITH MINERALS) tablet Take 1 tablet by mouth daily.   Yes Historical Provider, MD  Omega 3 1000 MG CAPS Take by mouth. 12/05/09  Yes Historical Provider, MD  tamsulosin (FLOMAX) 0.4 MG CAPS capsule Take 1 capsule (0.4 mg total) by mouth daily. 10/26/16  Yes [name], NP  TRUEPLUS LANCETS 30G MISC 1 each by Does not apply route 2 (two) times daily. Dx E11.9 03/13/15  Yes [name], NP    Family History Family History  Problem Relation Age of Onset  . Diabetes Mother   . Hyperlipidemia Mother   . Bladder Cancer Father   . Liver cancer Sister   . Diabetes Sister   . Diabetes Maternal Aunt   . Arthritis Maternal Grandmother   . Diabetes Maternal Grandmother   . Arthritis Maternal Grandfather   . Arthritis Paternal Grandmother   . Diabetes Brother   . Heart disease  Brother   . Colon cancer Neg Hx     Social History Social History  Substance Use Topics  . Smoking status: Former Smoker    Quit date: 10/18/2009  . Smokeless tobacco: Former Systems developer    Types: Chew    Quit date: 10/18/1994  . Alcohol use Yes     Comment: occasionally     Allergies   Tape   Review of Systems Review of Systems  Constitutional: Negative.   HENT: Negative.   Eyes: Negative.   Respiratory: Positive for chest tightness. Negative for cough.   Cardiovascular: Negative.   Gastrointestinal: Negative.   Musculoskeletal: Negative.      Physical Exam Triage Vital Signs ED Triage Vitals  Enc Vitals Group     BP 11/20/16 1319 145/77     Pulse Rate 11/20/16 1319 90      Resp 11/20/16 1319 18     Temp 11/20/16 1319 98.3 F (36.8 C)     Temp Source 11/20/16 1319 Oral     SpO2 11/20/16 1319 96 %     Weight --      Height --      Head Circumference --      Peak Flow --      Pain Score 11/20/16 1324 4     Pain Loc --      Pain Edu? --      Excl. in [place]? --    No data found.   Updated Vital Signs BP 145/77   Pulse 90   Temp 98.3 F (36.8 C) (Oral)   Resp 18   SpO2 96%   Physical Exam  Constitutional: He is oriented to person, place, and time. He appears well-developed and well-nourished.  HENT:  Head: Normocephalic.  Right Ear: External ear normal.  Left Ear: External ear normal.  Mouth/Throat: Oropharynx is clear and moist.  Eyes: Conjunctivae and EOM are normal. Pupils are equal, round, and reactive to light.  Neck: Normal range of motion. Neck supple.  Cardiovascular: Normal rate, regular rhythm and normal heart sounds.   Pulmonary/Chest: Effort normal. He has no wheezes. He has no rales.  Coarse breath sounds throughout the lungs, symmetrical  Musculoskeletal: Normal range of motion.  Neurological: He is alert and oriented to person, place, and time.  Skin: Skin is warm and dry.  Nursing note and vitals reviewed.    UC Treatments / Results  Labs (all labs ordered are listed, but only abnormal results are displayed) Labs Reviewed - No data to display  EKG  EKG Interpretation None       Radiology Normal chest x-ray Procedures Procedures (including critical care time)  Medications Ordered in  Medications - No data to display   Initial Impression / Assessment and Plan / UC Course  I have reviewed the triage vital signs and the nursing notes.  Pertinent labs & imaging results that were available during my care of the patient were reviewed by me and considered in my medical decision making (see chart for details).     Final Clinical Impressions(s) / UC Diagnoses   Final diagnoses:  Bronchitis    New  Prescriptions New Prescriptions   No medications on file  Finish antibiotics and continue inhaler. Patient may return to work tomorrow.   Robyn Haber, MD 11/20/16 [phone]

## 2016-11-20 NOTE — ED Triage Notes (Signed)
Reports being treated for pneumonia since dx 11/09/16; finished prednisone this past week, continues to take doxycycline.  States improving, but needs medical clearance for work, and states he is still experiencing some chest tightness and hoarse voice.  Denies any fevers since tx.

## 2016-11-20 NOTE — Discharge Instructions (Signed)
Your x-ray shows no pneumonia. Either your improving from a pneumonia or you had bad bronchitis from the outset. Neither event finish her antibiotics and start your inhaler 4 times a day for the next several days. You're not contagious at this point. Therefore I'm sending it back to work.

## 2016-11-22 MED FILL — metFORMIN HCL 1000 MG TABS: 1000 | 30 days supply | Qty: 60 | Fill #3

## 2016-12-14 ENCOUNTER — Ambulatory Visit (INDEPENDENT_AMBULATORY_CARE_PROVIDER_SITE_OTHER): Payer: 59 | Admitting: Pulmonary Disease

## 2016-12-14 ENCOUNTER — Encounter: Payer: Self-pay | Admitting: Pulmonary Disease

## 2016-12-14 VITALS — BP 132/78 | HR 83 | Wt 238.0 lb

## 2016-12-14 DIAGNOSIS — G4710 Hypersomnia, unspecified: Secondary | ICD-10-CM

## 2016-12-14 DIAGNOSIS — G4719 Other hypersomnia: Secondary | ICD-10-CM | POA: Diagnosis not present

## 2016-12-14 DIAGNOSIS — G4733 Obstructive sleep apnea (adult) (pediatric): Secondary | ICD-10-CM

## 2016-12-14 NOTE — Patient Instructions (Signed)
We will schedule a home sleep study Assuming that this study confirms the diagnosis of sleep apnea, we will arrange CPAP therapy and see you back one month after starting CPAP to review your compliance report

## 2016-12-16 NOTE — Progress Notes (Signed)
PULMONARY CONSULT NOTE  Requesting MD/Service: Webb Silversmith Date of initial consultation: 12/14/16 Reason for consultation: OSA - not treated  PT PROFILE: 56 y.o. obese male initially diagnosed with OSA 20 yrs prior to initial evaluation but not treated for several years due to CPAP machine malfunction  HPI:  As above. He has not used CPAP for many years due to malfunction of his CPAP. He has lost some weight since initially diagnosed. His wife tells him he is a heavy snorer and that she witnesses apneas. His Epworth score is 20. He denies erectile dysfunction and hypertension.  Past Medical History:  Diagnosis Date  . Allergy   . Chicken pox   . Diabetes mellitus without complication ([place])   . GERD (gastroesophageal reflux disease)   . Heart murmur   . Pneumonia   . Sleep apnea    does not use cpap    Past Surgical History:  Procedure Laterality Date  . WRIST SURGERY Left 1984   cyst removal    MEDICATIONS: I have reviewed all medications and confirmed regimen as documented  Social History   Social History  . Marital status: Married    Spouse name: N/A  . Number of children: 2  . Years of education: N/A   Occupational History  . [employer] - EMT    Social History Main Topics  . Smoking status: Former Smoker    Quit date: 10/18/2009  . Smokeless tobacco: Former Systems developer    Types: Chew    Quit date: 10/18/1994  . Alcohol use Yes     Comment: occasionally  . Drug use: No  . Sexual activity: Not on file   Other Topics Concern  . Not on file   Social History Narrative  . No narrative on file    Family History  Problem Relation Age of Onset  . Diabetes Mother   . Hyperlipidemia Mother   . Bladder Cancer Father   . Liver cancer Sister   . Diabetes Sister   . Diabetes Maternal Aunt   . Arthritis Maternal Grandmother   . Diabetes Maternal Grandmother   . Arthritis Maternal Grandfather   . Arthritis Paternal Grandmother   . Diabetes Brother   . Heart disease Brother    . Colon cancer Neg Hx     ROS: No fever, myalgias/arthralgias, unexplained weight loss or weight gain No new focal weakness or sensory deficits No otalgia, hearing loss, visual changes, nasal and sinus symptoms, mouth and throat problems No neck pain or adenopathy No abdominal pain, N/V/D, diarrhea, change in bowel pattern No dysuria, change in urinary pattern   Vitals:   12/14/16 1113  BP: 132/78  Pulse: 83  SpO2: 96%  Weight: 238 lb (108 kg)     EXAM:  Gen: Mild-mod obese, No overt respiratory distress HEENT: NCAT, sclera white, oropharynx normal Neck: Supple without LAN, thyromegaly, JVD Lungs: breath sounds: full, percussion: normal, No adventitious sounds Cardiovascular: RRR, no murmurs noted Abdomen: Soft, nontender, normal BS Ext: without clubbing, cyanosis, edema Neuro: CNs grossly intact, motor and sensory intact Skin: Limited exam, no lesions noted  DATA:   BMP Latest Ref Rng & Units 10/26/2016 10/02/2015 10/25/2013  Glucose 70 - 99 mg/dL 86 103(H) 84  BUN 6 - 23 mg/dL 23 18 19   Creatinine 0.40 - 1.50 mg/dL 1.02 1.00 1.3  Sodium 135 - 145 mEq/L 139 138 139  Potassium 3.5 - 5.1 mEq/L 4.3 4.4 4.4  Chloride 96 - 112 mEq/L 104 104 105  CO2 19 -  32 mEq/L 27 29 28   Calcium 8.4 - 10.5 mg/dL 9.7 9.5 9.5    CBC Latest Ref Rng & Units 10/26/2016 10/02/2015 03/13/2015  WBC 4.0 - 10.5 K/uL 5.0 5.1 4.6  Hemoglobin 13.0 - 17.0 g/dL 14.3 14.9 14.6  Hematocrit 39.0 - 52.0 % 42.8 45.5 44.7  Platelets 150.0 - 400.0 K/uL 223.0 232.0 242.0    CXR:  None available  IMPRESSION:     ICD-9-CM ICD-10-CM   1. OSA (obstructive sleep apnea) 327.23 G47.33   2. Daytime hypersomnolence 780.54 G47.19 Home sleep test     PLAN:  Home sleep study ordered When results are available to me, we will initiate CPAP (assuming OSA dx is confirmed) Follow up will be arranged after initiation of therapy   Merton Border, MD PCCM service Mobile [phone] Pager  [phone] 12/16/2016

## 2016-12-20 ENCOUNTER — Other Ambulatory Visit: Payer: Self-pay | Admitting: Internal Medicine

## 2016-12-20 MED FILL — TAMSULOSIN HCL 0.4 MG CAP: 0.4 | 30 days supply | Qty: 30 | Fill #1

## 2016-12-21 MED FILL — metFORMIN HCL 1000 MG TABS: 1000 | 30 days supply | Qty: 60 | Fill #0

## 2017-01-01 ENCOUNTER — Encounter: Payer: Self-pay | Admitting: Internal Medicine

## 2017-01-01 DIAGNOSIS — G4719 Other hypersomnia: Secondary | ICD-10-CM

## 2017-01-01 DIAGNOSIS — G4710 Hypersomnia, unspecified: Secondary | ICD-10-CM

## 2017-01-03 DIAGNOSIS — G4733 Obstructive sleep apnea (adult) (pediatric): Secondary | ICD-10-CM | POA: Diagnosis not present

## 2017-01-05 ENCOUNTER — Telehealth: Payer: Self-pay | Admitting: *Deleted

## 2017-01-05 DIAGNOSIS — G4733 Obstructive sleep apnea (adult) (pediatric): Secondary | ICD-10-CM

## 2017-01-05 NOTE — Telephone Encounter (Signed)
Pt informed of sleep study results. Order placed for CPAP. Nothing further needed. 

## 2017-01-17 MED FILL — TAMSULOSIN HCL 0.4 MG CAP: 0.4 | 30 days supply | Qty: 30 | Fill #2

## 2017-01-21 ENCOUNTER — Telehealth: Payer: Self-pay | Admitting: Pulmonary Disease

## 2017-01-21 NOTE — Telephone Encounter (Signed)
Per Jiles Crocker at [facility], they have attempted to contact patient on 01/07/17, 01/13/17 and 01/14/17. Was unable to reach patient.  Corene Cornea advised that I give patient the phone number to [facility] [phone] and ask for the Respiratory Dept.    ATC patient back and advise of the above, however, did not get an answer. LMOAM for pt and advised of the above to contact [facility] , since there has been difficulty in reaching him.  Advised patient on the voice mail that if he had any additional questions for me, he can reach me at [phone]. Rhonda J Cobb

## 2017-01-21 NOTE — Telephone Encounter (Signed)
Called and left message for Marc Aguilar with [facility] to check on status of CPAP set up.  Waiting on response. Rhonda J Cobb

## 2017-01-21 NOTE — Telephone Encounter (Signed)
Order placed for CPAP 01/05/17 and pt states he hasn't heard from [facility].

## 2017-01-21 NOTE — Telephone Encounter (Signed)
Called patient back and he spoke with [facility] and has an appointment with [facility] to be set up on CPAP on Tues 01/25/17 at the [facility] location. Nothing else needed at this time. Rhonda J Cobb

## 2017-01-21 NOTE — Telephone Encounter (Signed)
Pt calling stating he has not heard anything from anyone about getting a cpap machine and it has been a few weeks now since we told him we placed the order Please advise.

## 2017-01-25 DIAGNOSIS — G4733 Obstructive sleep apnea (adult) (pediatric): Secondary | ICD-10-CM | POA: Diagnosis not present

## 2017-01-28 ENCOUNTER — Encounter: Payer: Self-pay | Admitting: Internal Medicine

## 2017-01-31 MED FILL — metFORMIN HCL 1000 MG TABS: 1000 | 30 days supply | Qty: 60 | Fill #1

## 2017-02-01 MED ORDER — LANCETS 30G MISC: 1.0000 | Freq: Two times a day (BID) | 3 refills | Status: DC

## 2017-02-01 MED ORDER — ALCOHOL PREP PADS: 1.0000 | MEDICATED_PAD | Freq: Two times a day (BID) | 3 refills | Status: DC

## 2017-02-01 MED ORDER — GLUCOSE BLOOD VI STRP: 1.0000 | ORAL_STRIP | Freq: Two times a day (BID) | 3 refills | Status: DC

## 2017-02-01 MED FILL — ACCU-CHEK FASTCLIX LANCETS: 90 days supply | Qty: 204 | Fill #0

## 2017-02-01 MED FILL — SM ALCOHOL 70% PREP PADS: 90 days supply | Qty: 200 | Fill #0

## 2017-02-01 MED FILL — ACCU-CHEK GUIDE TEST STRIP: 90 days supply | Qty: 200 | Fill #0

## 2017-02-18 ENCOUNTER — Other Ambulatory Visit: Payer: Self-pay | Admitting: Internal Medicine

## 2017-02-18 DIAGNOSIS — N401 Enlarged prostate with lower urinary tract symptoms: Secondary | ICD-10-CM

## 2017-02-18 DIAGNOSIS — R350 Frequency of micturition: Principal | ICD-10-CM

## 2017-02-18 MED FILL — TAMSULOSIN HCL 0.4 MG CAP: 0.4 | 30 days supply | Qty: 30 | Fill #0

## 2017-02-24 DIAGNOSIS — G4733 Obstructive sleep apnea (adult) (pediatric): Secondary | ICD-10-CM | POA: Diagnosis not present

## 2017-02-28 MED FILL — metFORMIN HCL 1000 MG TABS: 1000 | 30 days supply | Qty: 60 | Fill #2

## 2017-03-27 DIAGNOSIS — G4733 Obstructive sleep apnea (adult) (pediatric): Secondary | ICD-10-CM | POA: Diagnosis not present

## 2017-03-28 ENCOUNTER — Encounter: Payer: Self-pay | Admitting: Pulmonary Disease

## 2017-03-28 ENCOUNTER — Ambulatory Visit (INDEPENDENT_AMBULATORY_CARE_PROVIDER_SITE_OTHER): Payer: 59 | Admitting: Pulmonary Disease

## 2017-03-28 VITALS — BP 124/78 | HR 78 | Ht 71.0 in | Wt 237.0 lb

## 2017-03-28 DIAGNOSIS — G4719 Other hypersomnia: Secondary | ICD-10-CM | POA: Diagnosis not present

## 2017-03-28 DIAGNOSIS — Z9114 Patient's other noncompliance with medication regimen: Secondary | ICD-10-CM

## 2017-03-28 DIAGNOSIS — G4733 Obstructive sleep apnea (adult) (pediatric): Secondary | ICD-10-CM | POA: Diagnosis not present

## 2017-03-28 NOTE — Patient Instructions (Addendum)
Continue CPAP at current settings Try without humidity as discussed Follow-up in 6 months

## 2017-03-28 NOTE — Progress Notes (Signed)
PULMONARY/SLEEP OFFICE FOLLOW NOTE  Requesting MD/Service: Webb Silversmith Date of initial consultation: 12/14/16 Reason for consultation: OSA - not treated  PT PROFILE: 56 y.o. obese male initially diagnosed with OSA 20 yrs prior to initial evaluation but not treated for several years due to CPAP machine malfunction  DATA/EVENTS: 01/01/17 Home sleep study: AHI 22.5, minimum SpO2 81% 01/25/17 Autoset CPAP initiation 5-20 cm H2O 05/09-06/07/18 Compliance: 27/30 days, greater than 4 hrs 18 days  SUBJ: Routine follow-up for sleep apnea. CPAP was initiated in mid April. His compliance report for the last month as documented above. He uses a nasal mask. He wears it consistently. However, his work schedule often prevents him from sleeping more than 4 hours at a time. His only complaint is the tendency for precipitation to build up in the mask. He feels like his sleep quality has improved, in particular, morning headaches have improved. He continues to work on weight loss by increasing exercise and improving his diet.   OBJ: Vitals:   03/28/17 0851  BP: 124/78  Pulse: 78  SpO2: 99%  Weight: 237 lb (107.5 kg)  Height: 5\' 11"  (1.803 m)    EXAM:  Gen: Moderately obese, No overt respiratory distress HEENT: NCAT, sclera white, oropharynx normal Neck: Supple without LAN, thyromegaly, JVD Lungs: breath sounds: full, no wheezes Cardiovascular: RRR, no murmurs noted Abdomen: Soft, nontender, normal BS Ext: without clubbing, cyanosis, edema Neuro: CNs grossly intact, motor and sensory intact Skin: Limited exam, no lesions noted  DATA:   BMP Latest Ref Rng & Units 10/26/2016 10/02/2015 10/25/2013  Glucose 70 - 99 mg/dL 86 103(H) 84  BUN 6 - 23 mg/dL 23 18 19   Creatinine 0.40 - 1.50 mg/dL 1.02 1.00 1.3  Sodium 135 - 145 mEq/L 139 138 139  Potassium 3.5 - 5.1 mEq/L 4.3 4.4 4.4  Chloride 96 - 112 mEq/L 104 104 105  CO2 19 - 32 mEq/L 27 29 28   Calcium 8.4 - 10.5 mg/dL 9.7 9.5 9.5    CBC Latest  Ref Rng & Units 10/26/2016 10/02/2015 03/13/2015  WBC 4.0 - 10.5 K/uL 5.0 5.1 4.6  Hemoglobin 13.0 - 17.0 g/dL 14.3 14.9 14.6  Hematocrit 39.0 - 52.0 % 42.8 45.5 44.7  Platelets 150.0 - 400.0 K/uL 223.0 232.0 242.0    CXR 11/20/16:  Normal  IMPRESSION:     ICD-10-CM   1. OSA (obstructive sleep apnea) G47.33   2. Daytime hypersomnolence G47.19   3. suboptimal compliance with CPAP treatment Z91.14      PLAN:  Continue CPAP at current settings Suggested that he try using it without humidification. Encouraged maximum compliance with CPAP prescription Encouraged to continue efforts at weight loss Follow-up in 6 months   Merton Border, MD PCCM service Mobile [phone] Pager [phone] 03/28/2017 8:52 AM

## 2017-03-31 MED FILL — TAMSULOSIN HCL 0.4 MG CAP: 0.4 | 30 days supply | Qty: 30 | Fill #1

## 2017-03-31 MED FILL — metFORMIN HCL 1000 MG TABS: 1000 | 30 days supply | Qty: 60 | Fill #3

## 2017-04-14 DIAGNOSIS — H40013 Open angle with borderline findings, low risk, bilateral: Secondary | ICD-10-CM | POA: Diagnosis not present

## 2017-04-14 LAB — HM DIABETES EYE EXAM

## 2017-04-18 MED FILL — LATANOPROST 0.005% EYE DRP: 0.005 | 28 days supply | Qty: 3 | Fill #0

## 2017-04-26 DIAGNOSIS — G4733 Obstructive sleep apnea (adult) (pediatric): Secondary | ICD-10-CM | POA: Diagnosis not present

## 2017-04-29 MED FILL — TAMSULOSIN HCL 0.4 MG CAP: 0.4 | 30 days supply | Qty: 30 | Fill #2

## 2017-04-29 MED FILL — metFORMIN HCL 1000 MG TABS: 1000 | 30 days supply | Qty: 60 | Fill #4

## 2017-05-02 MED FILL — LATANOPROST 0.005% EYE DRP: 0.005 | 28 days supply | Qty: 3 | Fill #1

## 2017-05-03 ENCOUNTER — Encounter: Payer: Self-pay | Admitting: Internal Medicine

## 2017-05-11 DIAGNOSIS — H2513 Age-related nuclear cataract, bilateral: Secondary | ICD-10-CM | POA: Diagnosis not present

## 2017-05-11 DIAGNOSIS — E119 Type 2 diabetes mellitus without complications: Secondary | ICD-10-CM | POA: Diagnosis not present

## 2017-05-11 DIAGNOSIS — H40013 Open angle with borderline findings, low risk, bilateral: Secondary | ICD-10-CM | POA: Diagnosis not present

## 2017-05-23 ENCOUNTER — Other Ambulatory Visit: Payer: Self-pay | Admitting: Internal Medicine

## 2017-05-23 DIAGNOSIS — R350 Frequency of micturition: Principal | ICD-10-CM

## 2017-05-23 DIAGNOSIS — N401 Enlarged prostate with lower urinary tract symptoms: Secondary | ICD-10-CM

## 2017-05-23 MED FILL — ACCU-CHEK GUIDE TEST STRIP: 90 days supply | Qty: 200 | Fill #1

## 2017-05-23 MED FILL — TAMSULOSIN HCL 0.4 MG CAP: 0.4 | 30 days supply | Qty: 30 | Fill #0

## 2017-05-23 MED FILL — metFORMIN HCL 1000 MG TABS: 1000 | 30 days supply | Qty: 60 | Fill #0

## 2017-05-23 MED FILL — SM ALCOHOL 70% PREP PADS: 90 days supply | Qty: 200 | Fill #0

## 2017-05-23 MED FILL — ACCU-CHEK FASTCLIX LANCETS: 90 days supply | Qty: 204 | Fill #1

## 2017-05-27 DIAGNOSIS — G4733 Obstructive sleep apnea (adult) (pediatric): Secondary | ICD-10-CM | POA: Diagnosis not present

## 2017-06-28 MED FILL — TAMSULOSIN HCL 0.4 MG CAP: 0.4 | 30 days supply | Qty: 30 | Fill #1

## 2017-06-28 MED FILL — metFORMIN HCL 1000 MG TABS: 1000 | 30 days supply | Qty: 60 | Fill #1

## 2017-07-05 ENCOUNTER — Other Ambulatory Visit: Payer: Self-pay | Admitting: Occupational Medicine

## 2017-07-05 ENCOUNTER — Ambulatory Visit: Payer: Self-pay

## 2017-07-05 DIAGNOSIS — M79644 Pain in right finger(s): Secondary | ICD-10-CM

## 2017-08-01 MED FILL — LATANOPROST 0.005% EYE DRP: 0.005 | 28 days supply | Qty: 3 | Fill #2

## 2017-08-01 MED FILL — TAMSULOSIN HCL 0.4 MG CAP: 0.4 | 30 days supply | Qty: 30 | Fill #2

## 2017-08-01 MED FILL — metFORMIN HCL 1000 MG TABS: 1000 | 30 days supply | Qty: 60 | Fill #2

## 2017-08-30 MED FILL — metFORMIN HCL 1000 MG TABS: 1000 | 30 days supply | Qty: 60 | Fill #3

## 2017-08-30 MED FILL — TAMSULOSIN HCL 0.4 MG CAP: 0.4 | 30 days supply | Qty: 30 | Fill #3

## 2017-09-13 ENCOUNTER — Other Ambulatory Visit: Payer: Self-pay

## 2017-09-13 NOTE — Patient Outreach (Signed)
[facility]) Care Management  09/13/2017  GARON MELANDER 1961/01/18 [id number]   Case closed in [place].  Member is being followed by the [employer] program Member enrolled in an external program. [name] RN, [facility] Care Management Coordinator-Link to [place] [facility] [phone]

## 2017-10-03 ENCOUNTER — Other Ambulatory Visit: Payer: Self-pay | Admitting: Internal Medicine

## 2017-10-03 DIAGNOSIS — R350 Frequency of micturition: Principal | ICD-10-CM

## 2017-10-03 DIAGNOSIS — N401 Enlarged prostate with lower urinary tract symptoms: Secondary | ICD-10-CM

## 2017-10-04 ENCOUNTER — Other Ambulatory Visit: Payer: Self-pay | Admitting: Internal Medicine

## 2017-10-04 NOTE — Telephone Encounter (Signed)
Copied from [place] [id number]. Topic: Quick Communication - See Telephone Encounter >> Oct 04, 2017  9:14 AM Bea Graff, NT wrote: CRM for notification. See Telephone encounter for: Pt needing a refill of Flomax, metformin, glucose test strips, and lancets. Uses [facility] on [address] in [place].   10/04/17.

## 2017-10-05 ENCOUNTER — Other Ambulatory Visit: Payer: Self-pay | Admitting: *Deleted

## 2017-10-05 DIAGNOSIS — R350 Frequency of micturition: Principal | ICD-10-CM

## 2017-10-05 DIAGNOSIS — N401 Enlarged prostate with lower urinary tract symptoms: Secondary | ICD-10-CM

## 2017-10-05 MED ORDER — TAMSULOSIN HCL 0.4 MG PO CAPS: 0.4000 mg | ORAL_CAPSULE | Freq: Every day | ORAL | 0 refills | Status: DC

## 2017-10-05 MED ORDER — METFORMIN HCL 1000 MG PO TABS: ORAL_TABLET | ORAL | 0 refills | Status: DC

## 2017-10-05 MED ORDER — GLUCOSE BLOOD VI STRP: 1.0000 | ORAL_STRIP | Freq: Two times a day (BID) | 3 refills | Status: DC

## 2017-10-14 DIAGNOSIS — G4733 Obstructive sleep apnea (adult) (pediatric): Secondary | ICD-10-CM | POA: Diagnosis not present

## 2017-10-14 MED FILL — metFORMIN HCL 1000 MG TABS: 1000 | 30 days supply | Qty: 60 | Fill #0

## 2017-10-14 MED FILL — TAMSULOSIN HCL 0.4 MG CAP: 0.4 | 30 days supply | Qty: 30 | Fill #0

## 2017-10-14 MED FILL — ACCU-CHEK GUIDE TEST STRIP: 90 days supply | Qty: 200 | Fill #0

## 2017-10-25 MED FILL — LATANOPROST 0.005% EYE DRP: 0.005 | 28 days supply | Qty: 3 | Fill #3

## 2017-10-27 ENCOUNTER — Telehealth: Payer: Self-pay

## 2017-10-27 ENCOUNTER — Encounter: Payer: 59 | Admitting: Internal Medicine

## 2017-10-27 NOTE — Telephone Encounter (Signed)
Do not charge cancellation fee.

## 2017-10-27 NOTE — Telephone Encounter (Signed)
Copied from [place] [id number]. Topic: Quick Communication - Appointment Cancellation >> Oct 27, 2017  8:12 AM Marc Aguilar wrote: Patient called to cancel appointment scheduled for 10/27/17. Patient has rescheduled their appointment.    Route to department's PEC pool.

## 2017-10-27 NOTE — Telephone Encounter (Signed)
Pt called on 10/27/17 at 8:12 am to cancel CPX on 10/27/17 at 2:30. (appt slot has been filled). Do you want to charge a late cancellation fee; pt has already rescheduled CPX. Pt did not know the tile man was coming today to his home until today.Please advise.

## 2017-11-10 ENCOUNTER — Telehealth: Payer: Self-pay | Admitting: Internal Medicine

## 2017-11-10 ENCOUNTER — Ambulatory Visit (INDEPENDENT_AMBULATORY_CARE_PROVIDER_SITE_OTHER): Payer: 59 | Admitting: Internal Medicine

## 2017-11-10 ENCOUNTER — Encounter: Payer: Self-pay | Admitting: Internal Medicine

## 2017-11-10 VITALS — BP 136/88 | HR 69 | Temp 98.2°F | Ht 70.0 in | Wt 234.0 lb

## 2017-11-10 DIAGNOSIS — N401 Enlarged prostate with lower urinary tract symptoms: Secondary | ICD-10-CM | POA: Diagnosis not present

## 2017-11-10 DIAGNOSIS — E7800 Pure hypercholesterolemia, unspecified: Secondary | ICD-10-CM | POA: Diagnosis not present

## 2017-11-10 DIAGNOSIS — Z125 Encounter for screening for malignant neoplasm of prostate: Secondary | ICD-10-CM

## 2017-11-10 DIAGNOSIS — R350 Frequency of micturition: Secondary | ICD-10-CM

## 2017-11-10 DIAGNOSIS — E119 Type 2 diabetes mellitus without complications: Secondary | ICD-10-CM

## 2017-11-10 DIAGNOSIS — Z0000 Encounter for general adult medical examination without abnormal findings: Secondary | ICD-10-CM

## 2017-11-10 DIAGNOSIS — G4733 Obstructive sleep apnea (adult) (pediatric): Secondary | ICD-10-CM

## 2017-11-10 DIAGNOSIS — K219 Gastro-esophageal reflux disease without esophagitis: Secondary | ICD-10-CM | POA: Diagnosis not present

## 2017-11-10 LAB — MICROALBUMIN / CREATININE URINE RATIO
CREATININE, U: 147.9 mg/dL
Microalb Creat Ratio: 0.5 mg/g (ref 0.0–30.0)

## 2017-11-10 LAB — LIPID PANEL
CHOLESTEROL: 167 mg/dL (ref 0–200)
HDL: 44.2 mg/dL (ref >39.00)
LDL CALC: 111 mg/dL — AB (ref 0–99)
NonHDL: 123.17
TRIGLYCERIDES: 59 mg/dL (ref 0.0–149.0)
Total CHOL/HDL Ratio: 4
VLDL: 11.8 mg/dL (ref 0.0–40.0)

## 2017-11-10 LAB — COMPREHENSIVE METABOLIC PANEL
ALBUMIN: 4.4 g/dL (ref 3.5–5.2)
ALT: 15 U/L (ref 0–53)
AST: 24 U/L (ref 0–37)
Alkaline Phosphatase: 63 U/L (ref 39–117)
BILIRUBIN TOTAL: 0.6 mg/dL (ref 0.2–1.2)
BUN: 27 mg/dL — ABNORMAL HIGH (ref 6–23)
CALCIUM: 9.5 mg/dL (ref 8.4–10.5)
CO2: 30 mEq/L (ref 19–32)
CREATININE: 1.05 mg/dL (ref 0.40–1.50)
Chloride: 102 mEq/L (ref 96–112)
GFR: 94.5 mL/min (ref >60.00)
Glucose, Bld: 97 mg/dL (ref 70–99)
Potassium: 4.2 mEq/L (ref 3.5–5.1)
Sodium: 139 mEq/L (ref 135–145)
Total Protein: 7.4 g/dL (ref 6.0–8.3)

## 2017-11-10 LAB — CBC
HCT: 44.5 % (ref 39.0–52.0)
HEMOGLOBIN: 14.7 g/dL (ref 13.0–17.0)
MCHC: 33.1 g/dL (ref 30.0–36.0)
MCV: 78.9 fl (ref 78.0–100.0)
PLATELETS: 253 10*3/uL (ref 150.0–400.0)
RBC: 5.64 Mil/uL (ref 4.22–5.81)
RDW: 15 % (ref 11.5–15.5)
WBC: 4 10*3/uL (ref 4.0–10.5)

## 2017-11-10 LAB — PSA: PSA: 3.4 ng/mL (ref 0.10–4.00)

## 2017-11-10 LAB — HEMOGLOBIN A1C: Hgb A1c MFr Bld: 6.8 % — ABNORMAL HIGH (ref 4.6–6.5)

## 2017-11-10 MED ORDER — TAMSULOSIN HCL 0.4 MG PO CAPS: 0.4000 mg | ORAL_CAPSULE | Freq: Every day | ORAL | 3 refills | Status: DC

## 2017-11-10 MED ORDER — METFORMIN HCL 1000 MG PO TABS: ORAL_TABLET | ORAL | 3 refills | Status: DC

## 2017-11-10 MED FILL — metFORMIN HCL 1000 MG TABS: 1000 | 90 days supply | Qty: 180 | Fill #0

## 2017-11-10 MED FILL — TAMSULOSIN HCL 0.4 MG CAP: 0.4 | 90 days supply | Qty: 90 | Fill #0

## 2017-11-10 NOTE — Assessment & Plan Note (Signed)
Encouraged weight loss Currently not an issue, will monitor

## 2017-11-10 NOTE — Assessment & Plan Note (Signed)
A1C and microalbumin today Encouraged him to consume a low carb diet and exercise for weight loss Continue Metformin, refilled today Foot exam today Encouraged yearly eye exams Flu and pneumovax UTD

## 2017-11-10 NOTE — Telephone Encounter (Signed)
Copied from [place]. Topic: Quick Communication - See Telephone Encounter >> Nov 10, 2017  9:16 AM Arletha Grippe wrote: CRM for notification. See Telephone encounter for:   11/10/17. Pt forgot to ask you - sometimes he gets cramps in forearms and his hands draw up and he has trouble using his hands. He said it is similar to a charlie horse but in his fore arms. He says this happens a few times a week. He is unsure if  he may have a vitamin /mineral deficiency .  Please call [phone]

## 2017-11-10 NOTE — Assessment & Plan Note (Signed)
Controlled with Flomax, refilled today

## 2017-11-10 NOTE — Progress Notes (Signed)
Subjective:    Patient ID: Marc Aguilar, male    DOB: [birth year], 57 y.o.   MRN: [id number]  HPI  Pt presents to the clinic today for his annual exam. He is also due to follow up chronic conditions.  HLD: His last LDL was 139, 10/2016. He is not taking Fish Oil because he ran out. He consumes a low fat diet.  GERD: Currently not an issue.  OSA: He averages 6 hours of sleep per night with the use of the CPAP. He does feel rested when he wakes up. Sleep study from 12/2016 reviewed. He follows with Dr. Alva Garnet.  BPH: His biggest complaint is urinary frequency. He is taking Flomax as prescribed. He would like a refill of Flomax today.  DM 2: His last A1C was 5%, 04/2016. He is taking Metformin as prescribed. His sugars range 77-175. He checks his feet at least once per week. His last eye exam was 03/2017, no retinopathy.  Flu: 06/2017 Tetanus: 10/2016 Pneumovax: 07/2014 PSA Screening: 10/2016 Colon Screening: 03/2014 Vision Screening: 03/2017, [facility]: biannually  Diet: Protein shake in the morning, protein shake for lunch, sensible dinner. He is drinking mostly water. Snacking on fruit and nuts. Exercise: None outside of work  Review of Systems      Past Medical History:  Diagnosis Date  . Allergy   . Chicken pox   . Diabetes mellitus without complication ([place])   . GERD (gastroesophageal reflux disease)   . Heart murmur   . Pneumonia   . Sleep apnea    does not use cpap    Current Outpatient Medications  Medication Sig Dispense Refill  . Alcohol Swabs (ALCOHOL PREP) PADS 1 each by Does not apply route 2 (two) times daily. 200 each 3  . aspirin EC 81 MG tablet Take 81 mg by mouth daily.    Marland Kitchen Fexofenadine HCl (ALLEGRA PO) Take by mouth.    . fluticasone (FLONASE) 50 MCG/ACT nasal spray Place 2 sprays into both nostrils daily.     Marland Kitchen glucose blood (ACCU-CHEK GUIDE) test strip 1 each by Other route 2 (two) times daily. Use as instructed 200 each 3  . ibuprofen  (ADVIL,MOTRIN) 800 MG tablet TAKE 1 TABLET (800 MG) BY MOUTH EVERY 8 HOURS AS NEEDED. 60 tablet 1  . Lancets 30G MISC 1 each by Does not apply route 2 (two) times daily. 200 each 3  . metFORMIN (GLUCOPHAGE) 1000 MG tablet TAKE 1 TABLET BY MOUTH 2 TIMES DAILY WITH A MEAL 60 tablet 0  . Multiple Vitamins-Minerals (MULTIVITAMIN WITH MINERALS) tablet Take 1 tablet by mouth daily.    . Omega 3 1000 MG CAPS Take by mouth.    . tamsulosin (FLOMAX) 0.4 MG CAPS capsule Take 1 capsule (0.4 mg total) by mouth daily. 30 capsule 0  . latanoprost (XALATAN) 0.005 % ophthalmic solution Place 1 drop into both eyes every evening.  6   No current facility-administered medications for this visit.     Allergies  Allergen Reactions  . Tape Rash    Paper tape-blisters    Family History  Problem Relation Age of Onset  . Diabetes Mother   . Hyperlipidemia Mother   . Bladder Cancer Father   . Liver cancer Sister   . Diabetes Sister   . Diabetes Maternal Aunt   . Arthritis Maternal Grandmother   . Diabetes Maternal Grandmother   . Arthritis Maternal Grandfather   . Arthritis Paternal Grandmother   . Diabetes  Brother   . Heart disease Brother   . Colon cancer Neg Hx     Social History   Socioeconomic History  . Marital status: Married    Spouse name: Not on file  . Number of children: 2  . Years of education: Not on file  . Highest education level: Not on file  Social Needs  . Financial resource strain: Not on file  . Food insecurity - worry: Not on file  . Food insecurity - inability: Not on file  . Transportation needs - medical: Not on file  . Transportation needs - non-medical: Not on file  Occupational History  . 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 Smoking status: Former Smoker    Last attempt to quit: 10/18/2009    Years since quitting: 8.0  . Smokeless tobacco: Former Systems developer    Types: [place] date: 10/18/1994  Substance and Sexual Activity  . Alcohol use: Yes    Comment:  occasionally  . Drug use: No  . Sexual activity: Not on file  Other Topics Concern  . Not on file  Social History Narrative  . Not on file     Constitutional: Denies fever, malaise, fatigue, headache or abrupt weight changes.  HEENT: Denies eye pain, eye redness, ear pain, ringing in the ears, wax buildup, runny nose, nasal congestion, bloody nose, or sore throat. Respiratory: Denies difficulty breathing, shortness of breath, cough or sputum production.   Cardiovascular: Denies chest pain, chest tightness, palpitations or swelling in the hands or feet.  Gastrointestinal: Denies abdominal pain, bloating, constipation, diarrhea or blood in the stool.  GU: Pt reports urinary frequency. Denies urgency, pain with urination, burning sensation, blood in urine, odor or discharge. Musculoskeletal: Pt reports intermittent hip pain. Denies decrease in range of motion, difficulty with gait, muscle pain or joint pain and swelling.  Skin: Denies redness, rashes, lesions or ulcercations.  Neurological: Denies dizziness, difficulty with memory, difficulty with speech or problems with balance and coordination.  Psych: Denies anxiety, depression, SI/HI.  No other specific complaints in a complete review of systems (except as listed in HPI above).  Objective:   Physical Exam  BP 136/88   Pulse 69   Temp 98.2 F (36.8 C) (Oral)   Ht 5\' 10"  (1.778 m)   Wt 234 lb (106.1 kg)   SpO2 98%   BMI 33.58 kg/m  Wt Readings from Last 3 Encounters:  11/10/17 234 lb (106.1 kg)  03/28/17 237 lb (107.5 kg)  12/14/16 238 lb (108 kg)    General: Appears his stated age, obese in NAD. Skin: Warm, dry and intact. No rashes ulcerations noted. HEENT: Head: normal shape and size; Eyes: sclera white, no icterus, conjunctiva pink, PERRLA and EOMs intact; Ears: Tm's gray and intact, normal light reflex; Throat/Mouth: Teeth present, mucosa pink and moist, no exudate, lesions or ulcerations noted.  Neck:  Neck supple,  trachea midline. No masses, lumps or thyromegaly present.  Cardiovascular: Normal rate and rhythm. S1,S2 noted.  No murmur, rubs or gallops noted. No JVD or BLE edema. No carotid bruits noted. Pulmonary/Chest: Normal effort and positive vesicular breath sounds. No respiratory distress. No wheezes, rales or ronchi noted.  Abdomen: Soft and nontender. Normal bowel sounds. Ventral hernia noted. Liver, spleen and kidneys non palpable. Musculoskeletal: Strength 5/5 BUE/BLE. No difficulty with gait.  Neurological: Alert and oriented. Cranial nerves II-XII grossly intact. Coordination normal.  Psychiatric: Mood and affect normal. Behavior is normal. Judgment and thought content  normal.     BMET    Component Value Date/Time   NA 139 10/26/2016 1412   K 4.3 10/26/2016 1412   CL 104 10/26/2016 1412   CO2 27 10/26/2016 1412   GLUCOSE 86 10/26/2016 1412   BUN 23 10/26/2016 1412   CREATININE 1.02 10/26/2016 1412   CALCIUM 9.7 10/26/2016 1412    Lipid Panel     Component Value Date/Time   CHOL 201 (H) 10/26/2016 1412   TRIG 106.0 10/26/2016 1412   HDL 40.60 10/26/2016 1412   CHOLHDL 5 10/26/2016 1412   VLDL 21.2 10/26/2016 1412   LDLCALC 139 (H) 10/26/2016 1412    CBC    Component Value Date/Time   WBC 5.0 10/26/2016 1412   RBC 5.49 10/26/2016 1412   HGB 14.3 10/26/2016 1412   HCT 42.8 10/26/2016 1412   PLT 223.0 10/26/2016 1412   MCV 78.1 10/26/2016 1412   MCHC 33.4 10/26/2016 1412   RDW 15.4 10/26/2016 1412   LYMPHSABS 2.2 03/13/2015 1355   MONOABS 0.4 03/13/2015 1355   EOSABS 0.2 03/13/2015 1355   BASOSABS 0.0 03/13/2015 1355    Hgb A1C Lab Results  Component Value Date   HGBA1C 5 05/04/2016            Assessment & Plan:   Preventative Health Maintenance:  Flu, tetanus and pneumovax UTD Colon screening UTD Encouraged him to consume a balanced diet and exercise regimen Advised him to see an eye doctor and dentist annually. Will check CBC, CMET, Lipid, A1C,  microalbumin and PSA today  RTC in 1 year, sooner if needed Webb Silversmith, NP

## 2017-11-10 NOTE — Patient Instructions (Signed)

## 2017-11-10 NOTE — Assessment & Plan Note (Signed)
Encouraged weight loss Advised him to continue to wear his CPAP He will continue to follow with pulmonology.

## 2017-11-10 NOTE — Assessment & Plan Note (Signed)
CMET, Lipid profile today Encouraged him to consume a low fat diet Advised him to restart Fish Oil

## 2017-11-10 NOTE — Telephone Encounter (Signed)
Usually a potassium or magnesium deficiency. We are checking potassium today but I did not order magnesium level. If potassium abnormal, we will supplement and this should improve. If potassium normal, I would advise him to try a magnesium supplement OTC.

## 2017-11-11 NOTE — Addendum Note (Signed)
Addended by: Lurlean Nanny on: 11/11/2017 01:27 PM   Modules accepted: Orders

## 2017-11-11 NOTE — Telephone Encounter (Signed)
Pt is aware as instructed and expressed understanding 

## 2017-11-15 DIAGNOSIS — E119 Type 2 diabetes mellitus without complications: Secondary | ICD-10-CM | POA: Diagnosis not present

## 2017-11-15 DIAGNOSIS — H40013 Open angle with borderline findings, low risk, bilateral: Secondary | ICD-10-CM | POA: Diagnosis not present

## 2017-11-22 DIAGNOSIS — E119 Type 2 diabetes mellitus without complications: Secondary | ICD-10-CM | POA: Diagnosis not present

## 2017-11-22 DIAGNOSIS — H40013 Open angle with borderline findings, low risk, bilateral: Secondary | ICD-10-CM | POA: Diagnosis not present

## 2017-11-22 DIAGNOSIS — H5213 Myopia, bilateral: Secondary | ICD-10-CM | POA: Diagnosis not present

## 2017-11-22 LAB — HM DIABETES EYE EXAM

## 2017-11-23 ENCOUNTER — Encounter: Payer: Self-pay | Admitting: Internal Medicine

## 2018-02-17 ENCOUNTER — Other Ambulatory Visit: Payer: Self-pay | Admitting: Internal Medicine

## 2018-02-17 MED FILL — ACCU-CHEK GUIDE TEST STRIP: 90 days supply | Qty: 200 | Fill #1

## 2018-02-17 MED FILL — TAMSULOSIN HCL 0.4 MG CAP: 0.4 | 90 days supply | Qty: 90 | Fill #1

## 2018-02-17 MED FILL — metFORMIN HCL 1000 MG TABS: 1000 | 90 days supply | Qty: 180 | Fill #1

## 2018-02-20 MED FILL — ACCU-CHEK FASTCLIX LANCETS: 90 days supply | Qty: 204 | Fill #0

## 2018-02-20 MED FILL — LATANOPROST 0.005% EYE DRP: 0.005 | 28 days supply | Qty: 3 | Fill #4

## 2018-02-28 ENCOUNTER — Ambulatory Visit (HOSPITAL_COMMUNITY)
Admission: EM | Disposition: A | Discharge status: Home / Self Care | Payer: Self-pay | Source: Home / Self Care | Attending: Emergency Medicine

## 2018-02-28 ENCOUNTER — Encounter (HOSPITAL_COMMUNITY): Payer: Self-pay | Admitting: Emergency Medicine

## 2018-02-28 ENCOUNTER — Emergency Department (HOSPITAL_COMMUNITY): Payer: 59

## 2018-02-28 ENCOUNTER — Observation Stay (HOSPITAL_COMMUNITY)
Admission: EM | Admit: 2018-02-28 | Discharge: 2018-03-01 | Disposition: A | Discharge status: Home / Self Care | Payer: 59 | Attending: Cardiology | Admitting: Cardiology

## 2018-02-28 ENCOUNTER — Other Ambulatory Visit: Payer: Self-pay

## 2018-02-28 ENCOUNTER — Emergency Department (HOSPITAL_COMMUNITY)
Admission: EM | Admit: 2018-02-28 | Discharge: 2018-02-28 | Disposition: A | Discharge status: Home / Self Care | Payer: 59 | Source: Home / Self Care | Attending: Emergency Medicine | Admitting: Emergency Medicine

## 2018-02-28 DIAGNOSIS — Z6832 Body mass index (BMI) 32.0-32.9, adult: Secondary | ICD-10-CM | POA: Diagnosis not present

## 2018-02-28 DIAGNOSIS — Z8249 Family history of ischemic heart disease and other diseases of the circulatory system: Secondary | ICD-10-CM | POA: Insufficient documentation

## 2018-02-28 DIAGNOSIS — K219 Gastro-esophageal reflux disease without esophagitis: Secondary | ICD-10-CM | POA: Insufficient documentation

## 2018-02-28 DIAGNOSIS — N401 Enlarged prostate with lower urinary tract symptoms: Secondary | ICD-10-CM | POA: Diagnosis not present

## 2018-02-28 DIAGNOSIS — Z79899 Other long term (current) drug therapy: Secondary | ICD-10-CM | POA: Insufficient documentation

## 2018-02-28 DIAGNOSIS — R0789 Other chest pain: Principal | ICD-10-CM | POA: Insufficient documentation

## 2018-02-28 DIAGNOSIS — I200 Unstable angina: Secondary | ICD-10-CM | POA: Diagnosis not present

## 2018-02-28 DIAGNOSIS — Z8619 Personal history of other infectious and parasitic diseases: Secondary | ICD-10-CM | POA: Diagnosis not present

## 2018-02-28 DIAGNOSIS — Z7984 Long term (current) use of oral hypoglycemic drugs: Secondary | ICD-10-CM | POA: Insufficient documentation

## 2018-02-28 DIAGNOSIS — Z7982 Long term (current) use of aspirin: Secondary | ICD-10-CM | POA: Insufficient documentation

## 2018-02-28 DIAGNOSIS — E669 Obesity, unspecified: Secondary | ICD-10-CM | POA: Insufficient documentation

## 2018-02-28 DIAGNOSIS — G4733 Obstructive sleep apnea (adult) (pediatric): Secondary | ICD-10-CM | POA: Insufficient documentation

## 2018-02-28 DIAGNOSIS — Z87891 Personal history of nicotine dependence: Secondary | ICD-10-CM

## 2018-02-28 DIAGNOSIS — R079 Chest pain, unspecified: Secondary | ICD-10-CM | POA: Diagnosis not present

## 2018-02-28 DIAGNOSIS — Z833 Family history of diabetes mellitus: Secondary | ICD-10-CM | POA: Insufficient documentation

## 2018-02-28 DIAGNOSIS — E119 Type 2 diabetes mellitus without complications: Secondary | ICD-10-CM | POA: Insufficient documentation

## 2018-02-28 DIAGNOSIS — Z888 Allergy status to other drugs, medicaments and biological substances status: Secondary | ICD-10-CM | POA: Diagnosis not present

## 2018-02-28 DIAGNOSIS — I517 Cardiomegaly: Secondary | ICD-10-CM | POA: Insufficient documentation

## 2018-02-28 DIAGNOSIS — R350 Frequency of micturition: Secondary | ICD-10-CM | POA: Insufficient documentation

## 2018-02-28 DIAGNOSIS — Z9889 Other specified postprocedural states: Secondary | ICD-10-CM | POA: Diagnosis not present

## 2018-02-28 DIAGNOSIS — E1169 Type 2 diabetes mellitus with other specified complication: Secondary | ICD-10-CM | POA: Diagnosis not present

## 2018-02-28 DIAGNOSIS — E785 Hyperlipidemia, unspecified: Secondary | ICD-10-CM | POA: Diagnosis not present

## 2018-02-28 HISTORY — DX: Personal history of other diseases of the musculoskeletal system and connective tissue: Z87.39

## 2018-02-28 HISTORY — PX: CARDIAC CATHETERIZATION: SHX172

## 2018-02-28 HISTORY — DX: Unspecified osteoarthritis, unspecified site: M19.90

## 2018-02-28 HISTORY — DX: Pure hypercholesterolemia, unspecified: E78.00

## 2018-02-28 HISTORY — DX: Migraine, unspecified, not intractable, without status migrainosus: G43.909

## 2018-02-28 HISTORY — PX: LEFT HEART CATH AND CORONARY ANGIOGRAPHY: CATH118249

## 2018-02-28 HISTORY — DX: Reserved for inherently not codable concepts without codable children: IMO0001

## 2018-02-28 HISTORY — DX: Obstructive sleep apnea (adult) (pediatric): G47.33

## 2018-02-28 HISTORY — DX: Unspecified abdominal hernia without obstruction or gangrene: K46.9

## 2018-02-28 HISTORY — DX: Other seasonal allergic rhinitis: J30.2

## 2018-02-28 HISTORY — DX: Procedure and treatment not carried out because of patient's decision for reasons of belief and group pressure: Z53.1

## 2018-02-28 HISTORY — DX: Type 2 diabetes mellitus without complications: E11.9

## 2018-02-28 HISTORY — DX: Dependence on other enabling machines and devices: Z99.89

## 2018-02-28 LAB — BASIC METABOLIC PANEL
ANION GAP: 9 (ref 5–15)
ANION GAP: 8 (ref 5–15)
BUN: 10 mg/dL (ref 6–20)
BUN: 14 mg/dL (ref 6–20)
CALCIUM: 9.6 mg/dL (ref 8.9–10.3)
CO2: 26 mmol/L (ref 22–32)
CO2: 27 mmol/L (ref 22–32)
Calcium: 9.5 mg/dL (ref 8.9–10.3)
Chloride: 104 mmol/L (ref 101–111)
Chloride: 103 mmol/L (ref 101–111)
Creatinine, Ser: 1.09 mg/dL (ref 0.61–1.24)
Creatinine, Ser: 1.04 mg/dL (ref 0.61–1.24)
GFR calc Af Amer: >60 mL/min (ref >60)
GLUCOSE: 118 mg/dL — AB (ref 65–99)
Glucose, Bld: 174 mg/dL — ABNORMAL HIGH (ref 65–99)
POTASSIUM: 4.1 mmol/L (ref 3.5–5.1)
POTASSIUM: 4 mmol/L (ref 3.5–5.1)
SODIUM: 139 mmol/L (ref 135–145)
Sodium: 138 mmol/L (ref 135–145)

## 2018-02-28 LAB — CBC WITH DIFFERENTIAL/PLATELET
BASOS ABS: 0 10*3/uL (ref 0.0–0.1)
Basophils Relative: 0 %
EOS ABS: 0.2 10*3/uL (ref 0.0–0.7)
EOS PCT: 3 %
HCT: 42.8 % (ref 39.0–52.0)
Hemoglobin: 14.4 g/dL (ref 13.0–17.0)
Lymphocytes Relative: 57 %
Lymphs Abs: 3 10*3/uL (ref 0.7–4.0)
MCH: 26.3 pg (ref 26.0–34.0)
MCHC: 33.6 g/dL (ref 30.0–36.0)
MCV: 78.1 fL (ref 78.0–100.0)
MONO ABS: 0.4 10*3/uL (ref 0.1–1.0)
Monocytes Relative: 7 %
Neutro Abs: 1.7 10*3/uL (ref 1.7–7.7)
Neutrophils Relative %: 33 %
PLATELETS: 221 10*3/uL (ref 150–400)
RBC: 5.48 MIL/uL (ref 4.22–5.81)
RDW: 14.6 % (ref 11.5–15.5)
WBC: 5.3 10*3/uL (ref 4.0–10.5)

## 2018-02-28 LAB — CBC
HEMATOCRIT: 44.4 % (ref 39.0–52.0)
HEMATOCRIT: 44.1 % (ref 39.0–52.0)
HEMOGLOBIN: 14.4 g/dL (ref 13.0–17.0)
HEMOGLOBIN: 14.4 g/dL (ref 13.0–17.0)
MCH: 25.3 pg — ABNORMAL LOW (ref 26.0–34.0)
MCH: 25.6 pg — ABNORMAL LOW (ref 26.0–34.0)
MCHC: 32.4 g/dL (ref 30.0–36.0)
MCHC: 32.7 g/dL (ref 30.0–36.0)
MCV: 77.9 fL — ABNORMAL LOW (ref 78.0–100.0)
MCV: 78.3 fL (ref 78.0–100.0)
Platelets: 242 10*3/uL (ref 150–400)
Platelets: 232 10*3/uL (ref 150–400)
RBC: 5.7 MIL/uL (ref 4.22–5.81)
RBC: 5.63 MIL/uL (ref 4.22–5.81)
RDW: 14.8 % (ref 11.5–15.5)
RDW: 14.7 % (ref 11.5–15.5)
WBC: 4.5 10*3/uL (ref 4.0–10.5)
WBC: 4.3 10*3/uL (ref 4.0–10.5)

## 2018-02-28 LAB — I-STAT TROPONIN, ED
TROPONIN I, POC: 0 ng/mL (ref 0.00–0.08)
TROPONIN I, POC: 0 ng/mL (ref 0.00–0.08)
TROPONIN I, POC: 0 ng/mL (ref 0.00–0.08)

## 2018-02-28 LAB — CREATININE, SERUM: Creatinine, Ser: 0.95 mg/dL (ref 0.61–1.24)

## 2018-02-28 LAB — GLUCOSE, CAPILLARY
GLUCOSE-CAPILLARY: 97 mg/dL (ref 65–99)
GLUCOSE-CAPILLARY: 152 mg/dL — AB (ref 65–99)

## 2018-02-28 LAB — TROPONIN I: Troponin I: <0.03 ng/mL (ref <0.03)

## 2018-02-28 LAB — D-DIMER, QUANTITATIVE: D-Dimer, Quant: 0.39 ug/mL-FEU (ref 0.00–0.50)

## 2018-02-28 SURGERY — LEFT HEART CATH AND CORONARY ANGIOGRAPHY
Anesthesia: LOCAL

## 2018-02-28 MED ORDER — SODIUM CHLORIDE 0.9% FLUSH: 3.0000 mL | INTRAVENOUS | Status: DC | PRN

## 2018-02-28 MED ORDER — TAMSULOSIN HCL 0.4 MG PO CAPS
0.4000 mg | ORAL_CAPSULE | Freq: Every day | ORAL | Status: DC
  Administered 2018-02-28 – 2018-03-01 (×2): 0.4 mg via ORAL
  Filled 2018-02-28 (×2): qty 1

## 2018-02-28 MED ORDER — SODIUM CHLORIDE 0.9 % IV SOLN
INTRAVENOUS | Status: AC
  Administered 2018-02-28: 17:00:00 via INTRAVENOUS

## 2018-02-28 MED ORDER — HEPARIN (PORCINE) IN NACL 2-0.9 UNITS/ML
INTRAMUSCULAR | Status: AC | PRN
  Administered 2018-02-28 (×2): 500 mL via INTRA_ARTERIAL

## 2018-02-28 MED ORDER — ASPIRIN 81 MG PO CHEW
81.0000 mg | CHEWABLE_TABLET | ORAL | Status: DC
  Filled 2018-02-28: qty 1

## 2018-02-28 MED ORDER — HEPARIN SODIUM (PORCINE) 1000 UNIT/ML IJ SOLN
INTRAMUSCULAR | Status: AC
  Filled 2018-02-28: qty 1

## 2018-02-28 MED ORDER — SODIUM CHLORIDE 0.9% FLUSH
3.0000 mL | Freq: Two times a day (BID) | INTRAVENOUS | Status: DC
  Administered 2018-02-28 – 2018-03-01 (×2): 3 mL via INTRAVENOUS

## 2018-02-28 MED ORDER — SODIUM CHLORIDE 0.9% FLUSH
3.0000 mL | Freq: Two times a day (BID) | INTRAVENOUS | Status: DC
  Administered 2018-03-01: 3 mL via INTRAVENOUS

## 2018-02-28 MED ORDER — VERAPAMIL HCL 2.5 MG/ML IV SOLN
INTRAVENOUS | Status: AC
  Filled 2018-02-28: qty 2

## 2018-02-28 MED ORDER — SODIUM CHLORIDE 0.9 % IV SOLN: 250.0000 mL | INTRAVENOUS | Status: DC | PRN

## 2018-02-28 MED ORDER — ASPIRIN EC 81 MG PO TBEC: 81.0000 mg | DELAYED_RELEASE_TABLET | Freq: Every day | ORAL | Status: DC

## 2018-02-28 MED ORDER — FENTANYL CITRATE (PF) 100 MCG/2ML IJ SOLN
INTRAMUSCULAR | Status: AC
  Filled 2018-02-28: qty 2

## 2018-02-28 MED ORDER — ATORVASTATIN CALCIUM 80 MG PO TABS
80.0000 mg | ORAL_TABLET | Freq: Every day | ORAL | Status: DC
  Administered 2018-02-28: 80 mg via ORAL
  Filled 2018-02-28: qty 1

## 2018-02-28 MED ORDER — LIDOCAINE HCL (PF) 1 % IJ SOLN
INTRAMUSCULAR | Status: AC
  Filled 2018-02-28: qty 30

## 2018-02-28 MED ORDER — ONDANSETRON HCL 4 MG/2ML IJ SOLN: 4.0000 mg | Freq: Four times a day (QID) | INTRAMUSCULAR | Status: DC | PRN

## 2018-02-28 MED ORDER — HEPARIN (PORCINE) IN NACL 1000-0.9 UT/500ML-% IV SOLN
INTRAVENOUS | Status: AC
  Filled 2018-02-28: qty 1000

## 2018-02-28 MED ORDER — LATANOPROST 0.005 % OP SOLN
1.0000 [drp] | Freq: Every day | OPHTHALMIC | Status: DC
  Administered 2018-02-28: 1 [drp] via OPHTHALMIC
  Filled 2018-02-28: qty 2.5

## 2018-02-28 MED ORDER — NITROGLYCERIN 0.4 MG SL SUBL: 0.4000 mg | SUBLINGUAL_TABLET | SUBLINGUAL | Status: DC | PRN

## 2018-02-28 MED ORDER — ASPIRIN 81 MG PO CHEW
324.0000 mg | CHEWABLE_TABLET | Freq: Once | ORAL | Status: AC
  Administered 2018-02-28: 324 mg via ORAL
  Filled 2018-02-28: qty 4

## 2018-02-28 MED ORDER — MIDAZOLAM HCL 2 MG/2ML IJ SOLN
INTRAMUSCULAR | Status: DC | PRN
  Administered 2018-02-28: 2 mg via INTRAVENOUS

## 2018-02-28 MED ORDER — FENTANYL CITRATE (PF) 100 MCG/2ML IJ SOLN
INTRAMUSCULAR | Status: DC | PRN
  Administered 2018-02-28: 25 ug via INTRAVENOUS

## 2018-02-28 MED ORDER — NITROGLYCERIN 2 % TD OINT
1.0000 [in_us] | TOPICAL_OINTMENT | Freq: Once | TRANSDERMAL | Status: AC
  Administered 2018-02-28: 1 [in_us] via TOPICAL
  Filled 2018-02-28: qty 1

## 2018-02-28 MED ORDER — SODIUM CHLORIDE 0.9 % IV SOLN: INTRAVENOUS | Status: DC

## 2018-02-28 MED ORDER — LIDOCAINE HCL (PF) 1 % IJ SOLN
INTRAMUSCULAR | Status: DC | PRN
  Administered 2018-02-28: 30 mL

## 2018-02-28 MED ORDER — OMEGA-3-ACID ETHYL ESTERS 1 G PO CAPS
1.0000 g | ORAL_CAPSULE | Freq: Every day | ORAL | Status: DC
  Administered 2018-03-01: 1 g via ORAL
  Filled 2018-02-28: qty 1

## 2018-02-28 MED ORDER — MORPHINE SULFATE (PF) 4 MG/ML IV SOLN
2.0000 mg | Freq: Once | INTRAVENOUS | Status: AC
  Administered 2018-02-28: 2 mg via INTRAVENOUS
  Filled 2018-02-28: qty 1

## 2018-02-28 MED ORDER — MIDAZOLAM HCL 2 MG/2ML IJ SOLN
INTRAMUSCULAR | Status: AC
  Filled 2018-02-28: qty 2

## 2018-02-28 MED ORDER — HEPARIN SODIUM (PORCINE) 1000 UNIT/ML IJ SOLN
INTRAMUSCULAR | Status: DC | PRN
  Administered 2018-02-28: 5000 [IU] via INTRAVENOUS

## 2018-02-28 MED ORDER — IOPAMIDOL (ISOVUE-370) INJECTION 76%
INTRAVENOUS | Status: DC | PRN
  Administered 2018-02-28: 90 mL via INTRA_ARTERIAL

## 2018-02-28 MED ORDER — INSULIN ASPART 100 UNIT/ML ~~LOC~~ SOLN: 0.0000 [IU] | Freq: Every day | SUBCUTANEOUS | Status: DC

## 2018-02-28 MED ORDER — ENOXAPARIN SODIUM 40 MG/0.4ML ~~LOC~~ SOLN: 40.0000 mg | SUBCUTANEOUS | Status: DC

## 2018-02-28 MED ORDER — VERAPAMIL HCL 2.5 MG/ML IV SOLN
INTRAVENOUS | Status: DC | PRN
  Administered 2018-02-28: 16:00:00 via INTRA_ARTERIAL

## 2018-02-28 MED ORDER — INSULIN ASPART 100 UNIT/ML ~~LOC~~ SOLN
0.0000 [IU] | Freq: Three times a day (TID) | SUBCUTANEOUS | Status: DC
  Administered 2018-03-01: 3 [IU] via SUBCUTANEOUS

## 2018-02-28 MED ORDER — ADULT MULTIVITAMIN W/MINERALS CH
1.0000 | ORAL_TABLET | Freq: Every day | ORAL | Status: DC
  Administered 2018-03-01: 1 via ORAL
  Filled 2018-02-28 (×2): qty 1

## 2018-02-28 MED ORDER — ACETAMINOPHEN 325 MG PO TABS
650.0000 mg | ORAL_TABLET | ORAL | Status: DC | PRN
  Administered 2018-02-28: 650 mg via ORAL
  Filled 2018-02-28: qty 2

## 2018-02-28 SURGICAL SUPPLY — 13 items
CATH INFINITI 5 FR JL3.5 (CATHETERS) ×2 IMPLANT
CATH INFINITI 5FR ANG PIGTAIL (CATHETERS) ×2 IMPLANT
CATH INFINITI JR4 5F (CATHETERS) ×2 IMPLANT
DEVICE RAD COMP TR BAND LRG (VASCULAR PRODUCTS) ×2 IMPLANT
GUIDEWIRE INQWIRE 1.5J.035X260 (WIRE) ×1 IMPLANT
INQWIRE 1.5J .035X260CM (WIRE) ×2
KIT HEART LEFT (KITS) ×2 IMPLANT
NEEDLE PERC 21GX4CM (NEEDLE) ×2 IMPLANT
PACK CARDIAC CATHETERIZATION (CUSTOM PROCEDURE TRAY) ×2 IMPLANT
SHEATH RAIN RADIAL 21G 6FR (SHEATH) ×2 IMPLANT
SYR MEDRAD MARK V 150ML (SYRINGE) ×2 IMPLANT
TRANSDUCER W/STOPCOCK (MISCELLANEOUS) ×2 IMPLANT
TUBING CIL FLEX 10 FLL-RA (TUBING) ×2 IMPLANT

## 2018-02-28 NOTE — ED Provider Notes (Signed)
[facility] EMERGENCY DEPARTMENT Provider Note   CSN: [id number] Arrival date & time: 02/28/18  0935     History   Chief Complaint Chief Complaint  Patient presents with  . Chest Pain    HPI Marc Aguilar is a 57 y.o. male.  HPI Patient presents a few hours after being evaluated for chest pain, now with concern for persistent episodic chest pain. Patient notes that since his earlier evaluation he has had persistent episodes of upper central and right upper chest wall pain. Episodes are variable in severity, occurring without clear precipitant, and initially improved with nitroglycerin, but it is now unclear what improves the condition. No dyspnea. Patient does have bilateral lower extremity swelling, which he recently began wearing compression stockings for. He denies history of coronary disease, has had a stress test in the distant past, though does not recall results beyond it being essentially normal. Patient does have history of hypertension, hypercholesterolemia, states that he takes all medication as directed.  Past Medical History:  Diagnosis Date  . Allergy   . Chicken pox   . Diabetes mellitus without complication ([place])   . GERD (gastroesophageal reflux disease)   . Heart murmur   . Hernia, abdominal   . Pneumonia   . Sleep apnea    does not use cpap    Patient Active Problem List   Diagnosis Date Noted  . Benign prostatic hyperplasia with urinary frequency 10/26/2016  . HLD (hyperlipidemia) 10/02/2015  . OSA (obstructive sleep apnea) 03/13/2015  . Seasonal allergies 03/13/2015  . GERD (gastroesophageal reflux disease) 03/13/2015  . DM type 2 (diabetes mellitus, type 2) ([place]) 10/30/2013    Past Surgical History:  Procedure Laterality Date  . WRIST SURGERY Left 1984   cyst removal        Home Medications    Prior to Admission medications   Medication Sig Start Date End Date Taking? Authorizing Provider  aspirin EC 81 MG  tablet Take 81 mg by mouth daily.   Yes [provider]  hydroxypropyl methylcellulose / hypromellose (ISOPTO TEARS / GONIOVISC) 2.5 % ophthalmic solution Place 1 drop into both eyes as needed for dry eyes.   Yes [provider]  latanoprost (XALATAN) 0.005 % ophthalmic solution Place 1 drop into both eyes at bedtime.  10/25/17  Yes [provider]  metFORMIN (GLUCOPHAGE) 1000 MG tablet TAKE 1 TABLET BY MOUTH 2 TIMES DAILY WITH A MEAL 11/10/17  Yes Baity, Coralie Keens, NP  Multiple Vitamins-Minerals (MULTIVITAMIN WITH MINERALS) tablet Take 1 tablet by mouth daily.   Yes [provider]  Nutritional Supplements (PROTEIN SUPPLEMENT 80% PO) Take 8 oz by mouth daily.   Yes [provider]  omega-3 acid ethyl esters (LOVAZA) 1 g capsule Take 1 g by mouth daily.   Yes [provider]  tamsulosin (FLOMAX) 0.4 MG CAPS capsule Take 1 capsule (0.4 mg total) by mouth daily. 11/10/17  Yes Baity, Coralie Keens, NP  ACCU-CHEK FASTCLIX LANCETS MISC USE TWICE A DAY AS DIRECTED 02/20/18   [name], NP  Alcohol Swabs (ALCOHOL PREP) PADS 1 each by Does not apply route 2 (two) times daily. 02/01/17   [name], NP  glucose blood (ACCU-CHEK GUIDE) test strip 1 each by Other route 2 (two) times daily. Use as instructed 10/05/17   [name], NP    Family History Family History  Problem Relation Age of Onset  . Diabetes Mother   . Hyperlipidemia Mother   .  Bladder Cancer Father   . Liver cancer Sister   . Diabetes Sister   . Diabetes Maternal Aunt   . Arthritis Maternal Grandmother   . Diabetes Maternal Grandmother   . Arthritis Maternal Grandfather   . Arthritis Paternal Grandmother   . Diabetes Brother   . Heart disease Brother   . Colon cancer Neg Hx     Social History Social History   Tobacco Use  . Smoking status: Former Smoker    Last attempt to quit: 10/18/2009    Years since quitting: 8.3  . Smokeless tobacco: Former Systems developer    Types: Chew     Quit date: 10/18/1994  Substance Use Topics  . Alcohol use: Yes    Comment: occasionally  . Drug use: No     Allergies   Tape   Review of Systems Review of Systems  Constitutional:       Per HPI, otherwise negative  HENT:       Per HPI, otherwise negative  Respiratory:       Per HPI, otherwise negative  Cardiovascular:       Per HPI, otherwise negative  Gastrointestinal: Negative for vomiting.  Endocrine:       Negative aside from HPI  Genitourinary:       Neg aside from HPI   Musculoskeletal:       Per HPI, otherwise negative  Skin: Negative.   Neurological: Negative for syncope.     Physical Exam Updated Vital Signs BP 127/83   Pulse (!) 57   Temp 97.6 F (36.4 C) (Oral)   Resp 13   Ht 6' (1.829 m)   Wt 108.9 kg (240 lb)   SpO2 90%   BMI 32.55 kg/m   Physical Exam  Constitutional: He is oriented to person, place, and time. He appears well-developed. No distress.  HENT:  Head: Normocephalic and atraumatic.  Eyes: Conjunctivae and EOM are normal.  Cardiovascular: Normal rate and regular rhythm.  Pulmonary/Chest: Effort normal. No stridor. No respiratory distress.  Abdominal: He exhibits no distension.  Musculoskeletal: He exhibits no edema.  Minimal bilateral lower extremity edema  Neurological: He is alert and oriented to person, place, and time.  Skin: Skin is warm and dry.  Psychiatric: He has a normal mood and affect.  Nursing note and vitals reviewed.    ED Treatments / Results  Labs (all labs ordered are listed, but only abnormal results are displayed) Labs Reviewed  BASIC METABOLIC PANEL - Abnormal; Notable for the following components:      Result Value   Glucose, Bld 174 (*)    All other components within normal limits  CBC - Abnormal; Notable for the following components:   MCV 77.9 (*)    MCH 25.3 (*)    All other components within normal limits  D-DIMER, QUANTITATIVE (NOT AT [facility])  I-STAT TROPONIN, ED    EKG EKG  Interpretation  Date/Time:  Tuesday Feb 28 2018 09:40:14 EDT Ventricular Rate:  78 PR Interval:  158 QRS Duration: 86 QT Interval:  370 QTC Calculation: 421 R Axis:   89 Text Interpretation:  Normal sinus rhythm Nonspecific T wave abnormality T wave abnormality Abnormal ekg Confirmed by Carmin Muskrat [phone]) on 02/28/2018 10:37:28 AM   Radiology Dg Chest 2 View  Result Date: 02/28/2018 CLINICAL DATA:  57 y/o  M; right-sided chest pain. EXAM: CHEST - 2 VIEW COMPARISON:  11/20/2016 chest radiograph FINDINGS: Normal cardiac silhouette. Clear lungs. No pleural effusion or pneumothorax. No acute osseous  abnormality is evident. IMPRESSION: No acute pulmonary process identified. Electronically Signed   By: Kristine Garbe M.D.   On: 02/28/2018 03:39    Procedures Procedures (including critical care time)  Medications Ordered in ED Medications - No data to display   Initial Impression / Assessment and Plan / ED Course  I have reviewed the triage vital signs and the nursing notes.  Pertinent labs & imaging results that were available during my care of the patient were reviewed by me and considered in my medical decision making (see chart for details).    On repeat exam the patient is in similar condition, awake, alert. Labs reviewed, including normal troponin, normal d-dimer. Normal chest x-ray, EKG also reviewed with the patient. Given the patient's description of new, unusual chest pain, his risk profile, I discussed his case with our cardiology colleagues for consultation. Though the initial findings are reassuring, some suspicion for vasospasm or other cardiac etiology, patient will be admitted for further evaluation and management.   Final Clinical Impressions(s) / ED Diagnoses  Atypical chest pain   Carmin Muskrat, MD 02/28/18 [phone]

## 2018-02-28 NOTE — H&P (Addendum)
Cardiology Admission History and Physical:   Patient ID: Marc Aguilar; MRN: [id number]; DOB: Aug 24, 1961   Admission date: 02/28/2018  Primary Care Provider: [name], NP Primary Cardiologist: New to [facility]; has a new pt appt to see Dr. Martinique 03/17/18 Primary Electrophysiologist:  None  Chief Complaint:  Chest pain  Patient Profile:   Marc Aguilar is a 57 y.o. male with a PMH of HLD, DM type 2, OSA on CPAP, and BPH, who presented with complaints of right-sided chest pain.   History of Present Illness:   [name] was in his usual state of health until last night when he developed sudden onset right-sided chest pain radiating to his right scapula. He states he was taking a patients vitals when the pain started, not doing anything strenuous. He denied associated diaphoresis, dizziness, lightheadedness, nausea, vomiting, SOB, or syncope. He states the pain was initially an 8/10. He is a Designer, multimedia in the emergency department and had a staff member perform an EKG (picture in Thayer Jew, MD note 02/28/18) at the time of initial chest pain which was reportedly more severe than when the repeat EKG was obtained at 3:12am. His pain was relieved with morphine; he noted no change in pain with nitroglycerin paste. He noted some exacerbation of pain with movement but his chest did not hurt to touch. This morning he was discharged from the ED after 2 negative troponins and scheduled to follow-up with cardiology 03/17/18, however after leaving, he had a reoccurrence of chest pain which prompted him to return to the ED for further evaluation.    He denies prior cardiac history and does not follow with a cardiologist. He reports a similar episode of chest pain in 2016 for which he was evaluated at [facility] and underwent a NST which showed mild reversible myocardial ischemia involving apical segment but no evidence of MI, and EF was estimated at 56%; reportedly reviewed with a cardiologist at  [facility] who felt there were no signs of reversible ischemia. He denies family history of heart disease, MI, or CHF. He is a former smoker, quit in 2011. He has OSA and reports compliance with his CPAP machine.  He is currently still experiencing 3/10 right-sided chest pain, which radiates up to the right side of his neck. He denies recent fever, URI, DOE, exertional chest pain, orthopnea, PND, or recent travel.    ED course: VSS. Labs notable for electrolytes wnl, Cr 1.04, Hgb 14.4, PLT 242, Trop negative x2, Ddimer wnl. CXR without acute findings. EKG prior to evaluation with sinus rhythm with STE in V2-3 and STD in inferior leads. Repeat EKGs at 3:12am with sinus rhythm with TWI III and aVF, and submm STE V2; and at 9:40am with sinus rhythm with baseline artifact in inferior leads making it difficult to interpret, otherwise isolated TWI V6. Patient was initially given nitro ointment which did not help and morphine which ultimately relieved his pain. He was discharged home with cardiology follow-up, however represented a few hours later due to reoccurrence of pain. Cardiology admitting for further evaluation of chest pain.     Past Medical History:  Diagnosis Date  . Allergy   . Chicken pox   . Diabetes mellitus without complication ([place])   . GERD (gastroesophageal reflux disease)   . Heart murmur   . Hernia, abdominal   . Pneumonia   . Sleep apnea    does not use cpap    Past Surgical History:  Procedure Laterality Date  .  WRIST SURGERY Left 1984   cyst removal     Medications Prior to Admission: Prior to Admission medications   Medication Sig Start Date End Date Taking? Authorizing Provider  ACCU-CHEK FASTCLIX LANCETS MISC USE TWICE A DAY AS DIRECTED 02/20/18   [name], NP  Alcohol Swabs (ALCOHOL PREP) PADS 1 each by Does not apply route 2 (two) times daily. 02/01/17   [name], NP  aspirin EC 81 MG tablet Take 81 mg by mouth daily.    [provider]    fexofenadine (ALLEGRA) 180 MG tablet Take 180 mg by mouth daily as needed for allergies or rhinitis.    [provider]  fluticasone (FLONASE) 50 MCG/ACT nasal spray Place 2 sprays into both nostrils daily as needed for allergies.     [provider]  glucose blood (ACCU-CHEK GUIDE) test strip 1 each by Other route 2 (two) times daily. Use as instructed 10/05/17   [name], NP  latanoprost (XALATAN) 0.005 % ophthalmic solution Place 1 drop into both eyes at bedtime.  10/25/17   [provider]  metFORMIN (GLUCOPHAGE) 1000 MG tablet TAKE 1 TABLET BY MOUTH 2 TIMES DAILY WITH A MEAL 11/10/17   Baity, Coralie Keens, NP  Multiple Vitamins-Minerals (MULTIVITAMIN WITH MINERALS) tablet Take 1 tablet by mouth daily.    [provider]  omega-3 acid ethyl esters (LOVAZA) 1 g capsule Take 1 g by mouth daily.    [provider]  tamsulosin (FLOMAX) 0.4 MG CAPS capsule Take 1 capsule (0.4 mg total) by mouth daily. 11/10/17   [name], NP     Allergies:    Allergies  Allergen Reactions  . Tape Rash    Paper tape-blisters    Social History:   Social History   Tobacco Use  . Smoking status: Former Smoker    Last attempt to quit: 10/18/2009    Years since quitting: 8.3  . Smokeless tobacco: Former Systems developer    Types: Chew    Quit date: 10/18/1994  Substance Use Topics  . Alcohol use: Yes    Comment: occasionally  . Drug use: No   Social History   Social History Narrative  . Not on file     Family History:   The patient's family history includes Arthritis in his maternal grandfather, maternal grandmother, and paternal grandmother; Bladder Cancer in his father; Diabetes in his brother, maternal aunt, maternal grandmother, mother, and sister; Heart disease in his brother; Hyperlipidemia in his mother; Liver cancer in his sister. There is no history of Colon cancer.    ROS:  Please see the history of present illness.  All other ROS reviewed and  negative.     Physical Exam/Data:   Vitals:   02/28/18 0940 02/28/18 1134  BP: 138/86   Pulse: 78 75  Resp: 18 (!) 21  Temp: 97.6 F (36.4 C)   TempSrc: Oral   SpO2: 100% 93%   No intake or output data in the 24 hours ending 02/28/18 1221 There were no vitals filed for this visit. There is no height or weight on file to calculate BMI.  General:  Well nourished, well developed, sitting upright in bed in no acute distress HEENT: sclera anicteric, MMM Neck: no JVD Vascular: No carotid bruits; distal pulses 2+ bilaterally  Cardiac:  normal S1, S2; RRR; no murmurs, gallops, or rubs Lungs:  clear to auscultation bilaterally, no wheezing, rhonchi or rales  Abd: soft, obese nontender, no hepatomegaly  Ext:  no edema Musculoskeletal:  No deformities, BUE and BLE strength normal and equal Skin: warm and dry  Neuro:  CNs 2-12 intact, no focal abnormalities noted Psych:  Normal affect    EKG:  EKG prior to evaluation with sinus rhythm with STE in V2-3 and STD in inferior leads. Repeat EKGs  at 3:12am with sinus rhythm with TWI III and aVF, and submm STE V2; and at 9:40am with sinus rhythm with baseline artifact in inferior leads making it difficult to interpret, otherwise isolated TWI V6.   Relevant CV Studies:  Nuclear stress test 2016: IMPRESSION:  1. Mild reversible myocardial ischemia involving apical segment. 2. Negative for evidence of myocardial infarction. 3. Normal left ventricular ejection fraction calculated at 56 %. 4. Limited CT findings as above.  Laboratory Data:  Chemistry Recent Labs  Lab 02/28/18 0300 02/28/18 0949  NA 139 138  K 4.1 4.0  CL 104 103  CO2 26 27  GLUCOSE 118* 174*  BUN 10 14  CREATININE 1.09 1.04  CALCIUM 9.6 9.5  GFRNONAA >60 >60  GFRAA >60 >60  ANIONGAP 9 8    No results for input(s): PROT, ALBUMIN, AST, ALT, ALKPHOS, BILITOT in the last 168 hours. Hematology Recent Labs  Lab 02/28/18 0300 02/28/18 0949  WBC 5.3 4.5  RBC 5.48  5.70  HGB 14.4 14.4  HCT 42.8 44.4  MCV 78.1 77.9*  MCH 26.3 25.3*  MCHC 33.6 32.4  RDW 14.6 14.8  PLT 221 242   Cardiac EnzymesNo results for input(s): TROPONINI in the last 168 hours.  Recent Labs  Lab 02/28/18 0309 02/28/18 0652 02/28/18 0953  TROPIPOC 0.00 0.00 0.00    BNPNo results for input(s): BNP, PROBNP in the last 168 hours.  DDimer  Recent Labs  Lab 02/28/18 0300  DDIMER 0.39    Radiology/Studies:  Dg Chest 2 View  Result Date: 02/28/2018 CLINICAL DATA:  57 y/o  M; right-sided chest pain. EXAM: CHEST - 2 VIEW COMPARISON:  11/20/2016 chest radiograph FINDINGS: Normal cardiac silhouette. Clear lungs. No pleural effusion or pneumothorax. No acute osseous abnormality is evident. IMPRESSION: No acute pulmonary process identified. Electronically Signed   By: Kristine Garbe M.D.   On: 02/28/2018 03:39    Assessment and Plan:   1. Chest pain: atypical chest pain - right sided, radiating to right neck and right scapula, without associated symptoms. Pain sometimes exacerbated with movement. He initially presented overnight after approaching an ED provider with an EKG he obtained prior to evaluation (see ED provider note from Thayer Jew, MD 02/28/18 for attached EKG) which revealed STE in V2-3 and STD in inferior leads. His troponins were negative, pain resolved with morphine, and he was initally discharged home with Cardiology follow-up 03/17/18. He re-presented later this morning due to recurrence of CP. Here troponins are negative x2, Ddimer negative. EKG at 3:12am with sinus rhythm with TWI III and aVF, and submm STE V2; and at 9:40am with sinus rhythm with baseline artifact in inferior leads making it difficult to interpret, otherwise isolated TWI V6. Last stress test in 2016 reviewed by cardiology at [facility] with c/f reversible apical ischemia. Risk factors for ACS include DM type 2, HLD, OSA, former smoker. CP concerning for ACS given dynamic changes on EKG - Will  plan for LHC today to further evaluate chest pain - NPO  - Will check an echocardiogram to evaluate LV function - Patient was given ASA at initial presentation. Will start atorvastatin this evening    2. HLD: last  LDL 111 10/2017 - Continue omega-3 - Continue to encourage dietary compliance - Will start atorvastatin given persistent LDL >100 and concern for ACS  3. OSA: compliant with CPAP machine - Continue CPAP inpatient  4. DM type 2: last A1C 6.8 10/2017; at goal of <7 - ISS while in-house - Plan to resume metformin at discharge - Continue to encourage dietary compliance and exercise  5. BPH: PSA wnl 10/2017 - Continue flomax   Severity of Illness: The appropriate patient status for this patient is OBSERVATION. Observation status is judged to be reasonable and necessary in order to provide the required intensity of service to ensure the patient's safety. The patient's presenting symptoms, physical exam findings, and initial radiographic and laboratory data in the context of their medical condition is felt to place them at decreased risk for further clinical deterioration. Furthermore, it is anticipated that the patient will be medically stable for discharge from the hospital within 2 midnights of admission. The following factors support the patient status of observation.   " The patient's presenting symptoms include right-sided chest pain. " The physical exam findings include normal cardiac exam. " The initial radiographic and laboratory data are an initial EKG with STE in V2-3 and STD in II, III, and aVF.     For questions or updates, please contact [place] Please consult [email or web address] for contact info under Cardiology/STEMI.    Signed, Abigail Butts, PA-C  02/28/2018 12:21 PM    I have seen, examined and evaluated the patient this PM along with Ms. Tommye Standard, PA-C.  After reviewing all the available data and chart, we discussed the patients laboratory, study &  physical findings as well as symptoms in detail. I agree with her findings, examination as well as impression recommendations as per our discussion.    Izaiha is very pleasant 57 year old gentleman with a history of hyperlipidemia, diabetes mellitus, type II and former smoker who is obese.  He has no prior cardiac history but had an episode of chest pain 3 years ago, was evaluated at [facility] with a Myoview stress test that suggested possible anterior apical reversible defect that was over read by the cardiologist as nonreversible.  He had been doing fine and was in his usual state of health working in the emergency room last night Feb 27, 2018 when he had an episode of right-sided chest discomfort radiating up to  right arm.  This was a sharp squeezing pain in his chest associated with some dyspnea that was relatively prolonged.  He had a friend of his check an EKG at that time which showed ST elevation in V2 and V3 (2 mm in V2 and one in V3 with subtle ST depressions in the inferior lateral leads.  He was therefore placed in a patient room for evaluation and when his pain had resolved upon rooming, repeat EKG showed resolution of the ST elevations with now T wave inversions in inferior leads.  He had at least one more episode of chest discomfort last night, but ruled out for MI with 2 sets of troponins and stable EKG after the initial abnormal EKG (not scanned).  He was discharged home to take his wife to an appointment, however upon arrival to the appointment he again had chest discomfort and came back to the emergency room.  He currently has had 2 troponin levels which have been drawn and are negative and he is now chest pain-free.  On exam is relatively benign with no abnormal acute findings.  His  EKG still has the T wave inversions in inferior leads but no longer has ST elevations in the anterior leads.   Impression: With abnormal EKG with dynamic ST changes in the setting of having chest pain and  several episodes (albeit somewhat atypical right-sided pain radiating to the arm).  Cannot exclude unstable angina.  He has multiple risk factors noted above.  We discussed various options.  I do not think that a stress test would be a great idea since he already had one that showed possibly an anterior ischemia (suggested again by the ST elevations on EKG last night).  We discussed stress test versus coronary CTA versus cardiac catheterization.  He agreed with me that the most reasonable option would be to proceed with cardiac catheterization as there is concern for unstable angina with recurrent chest pain overnight.  Performing MD:  Sherren Mocha, M.D  Procedure: LEFT HEART CATHETERIZATION WITH CORONARY ANGIOGRAPHY AND POSSIBLE PERTAINS CORONARY INTERVENTION  The procedure with Risks/Benefits/Alternatives and Indications was reviewed with the patient.  All questions were answered.    Risks / Complications include, but not limited to: Death, MI, CVA/TIA, VF/VT (with defibrillation), Bradycardia (need for temporary pacer placement), contrast induced nephropathy, bleeding / bruising / hematoma / pseudoaneurysm, vascular or coronary injury (with possible emergent CT or Vascular Surgery), adverse medication reactions, infection.  Additional risks involving the use of radiation with the possibility of radiation burns and cancer were explained in detail.  The patient voices understanding and agree to proceed.     Glenetta Hew, M.D., M.S. Interventional Cardiologist   Pager # [phone] Phone # [phone] [address] [place], [place] [address]

## 2018-02-28 NOTE — ED Triage Notes (Signed)
Patient here with chest pain, no shortness of breath, no nausea.  Patient states that he was walking when the pain started, right side of chest, no radiation.  He states that it is sharp in nature.

## 2018-02-28 NOTE — ED Notes (Signed)
Consent signed and witnessed

## 2018-02-28 NOTE — ED Notes (Signed)
Dr. Harding at bedside.

## 2018-02-28 NOTE — Discharge Instructions (Addendum)
You were seen today for chest pain.  Your repeat heart tests are reassuring.  It is very reported that you follow-up with cardiology for stress testing.  If you develop any new worsening symptoms, recurrent chest pain, you should be reevaluated immediately.

## 2018-02-28 NOTE — ED Notes (Signed)
Patient currently at X-ray .  

## 2018-02-28 NOTE — Plan of Care (Signed)
  Problem: Education: Goal: Knowledge of General Education information will improve Outcome: Completed/Met   Problem: Activity: Goal: Ability to return to baseline activity level will improve Outcome: Completed/Met   

## 2018-02-28 NOTE — ED Triage Notes (Addendum)
Pt states new onset right sided chest pain last night while working, pain was worse with movement but non reproducible with palpation. Pain has been constant and is now radiating up to right neck. Pain currently 5/10. Pt endorses lightheadedness. Pt endorses cough since last night. Denies swelling or weight gain. Pt was seen here this morning and has nitro paste. Reports headache. Had Xray chest this morning and 2 negative troponins.

## 2018-02-28 NOTE — Interval H&P Note (Signed)
History and Physical Interval Note:  02/28/2018 4:19 PM  Marc Aguilar  has presented today for surgery, with the diagnosis of cp  The various methods of treatment have been discussed with the patient and family. After consideration of risks, benefits and other options for treatment, the patient has consented to  Procedure(s): LEFT HEART CATH AND CORONARY ANGIOGRAPHY (N/A) as a surgical intervention .  The patient's history has been reviewed, patient examined, no change in status, stable for surgery.  I have reviewed the patient's chart and labs.  Questions were answered to the patient's satisfaction.     Sherren Mocha

## 2018-02-28 NOTE — ED Notes (Signed)
Per Jaci Standard Secretary, 6E Charge RN stated that report doesn't need to be called to [place] RN that cath lab will do it.

## 2018-02-28 NOTE — ED Notes (Signed)
Pt voices understanding of discharge instructions and importance of close follow up with cardiology. Pt ambulatory. VSS. Nitro paste removed.

## 2018-02-28 NOTE — ED Provider Notes (Signed)
[facility] EMERGENCY DEPARTMENT Provider Note   CSN: [id number] Arrival date & time: 02/28/18  0258     History   Chief Complaint Chief Complaint  Patient presents with  . Chest Pain    HPI Marc Aguilar is a 57 y.o. male.  HPI  This is a 57 year old male with history of diabetes, hyperlipidemia who presents with chest pain.  Patient reports 45 minutes of right-sided squeezing type chest pain.  He states at times it radiates to his scapula.  Reports worsening pain with some movement.  He has not taken anything for his pain.  Currently he rates his pain at 5 out of 10.  He denies cough, shortness of breath, diaphoresis.  Denies history of smoking or early family history of heart disease.  Denies leg swelling, recent hospitalization, recent travel, history of blood clots.  Of note, patient is a tech in the ED.  He approached me with an EKG he took prior to checking in.  He reported having chest pain at that time.  EKG initially with concerning anterior ST elevation and inferior depression.  I encouraged him to check in for formal evaluation.  Of note, patient states that during the time of the first EKG, his pain was "more severe."  Past Medical History:  Diagnosis Date  . Allergy   . Chicken pox   . Diabetes mellitus without complication ([place])   . GERD (gastroesophageal reflux disease)   . Heart murmur   . Pneumonia   . Sleep apnea    does not use cpap    Patient Active Problem List   Diagnosis Date Noted  . Benign prostatic hyperplasia with urinary frequency 10/26/2016  . HLD (hyperlipidemia) 10/02/2015  . OSA (obstructive sleep apnea) 03/13/2015  . Seasonal allergies 03/13/2015  . GERD (gastroesophageal reflux disease) 03/13/2015  . DM type 2 (diabetes mellitus, type 2) ([place]) 10/30/2013    Past Surgical History:  Procedure Laterality Date  . WRIST SURGERY Left 1984   cyst removal        Home Medications    Prior to Admission  medications   Medication Sig Start Date End Date Taking? Authorizing Provider  ACCU-CHEK FASTCLIX LANCETS MISC USE TWICE A DAY AS DIRECTED 02/20/18  Yes Baity, Coralie Keens, NP  Alcohol Swabs (ALCOHOL PREP) PADS 1 each by Does not apply route 2 (two) times daily. 02/01/17  Yes [name], NP  aspirin EC 81 MG tablet Take 81 mg by mouth daily.   Yes [provider]  fexofenadine (ALLEGRA) 180 MG tablet Take 180 mg by mouth daily as needed for allergies or rhinitis.   Yes [provider]  fluticasone (FLONASE) 50 MCG/ACT nasal spray Place 2 sprays into both nostrils daily as needed for allergies.    Yes [provider]  glucose blood (ACCU-CHEK GUIDE) test strip 1 each by Other route 2 (two) times daily. Use as instructed 10/05/17  Yes Baity, Coralie Keens, NP  latanoprost (XALATAN) 0.005 % ophthalmic solution Place 1 drop into both eyes at bedtime.  10/25/17  Yes [provider]  metFORMIN (GLUCOPHAGE) 1000 MG tablet TAKE 1 TABLET BY MOUTH 2 TIMES DAILY WITH A MEAL 11/10/17  Yes Baity, Coralie Keens, NP  Multiple Vitamins-Minerals (MULTIVITAMIN WITH MINERALS) tablet Take 1 tablet by mouth daily.   Yes [provider]  omega-3 acid ethyl esters (LOVAZA) 1 g capsule Take 1 g by mouth daily.   Yes [provider]  tamsulosin (FLOMAX) 0.4 MG  CAPS capsule Take 1 capsule (0.4 mg total) by mouth daily. 11/10/17  Yes [name], NP    Family History Family History  Problem Relation Age of Onset  . Diabetes Mother   . Hyperlipidemia Mother   . Bladder Cancer Father   . Liver cancer Sister   . Diabetes Sister   . Diabetes Maternal Aunt   . Arthritis Maternal Grandmother   . Diabetes Maternal Grandmother   . Arthritis Maternal Grandfather   . Arthritis Paternal Grandmother   . Diabetes Brother   . Heart disease Brother   . Colon cancer Neg Hx     Social History Social History   Tobacco Use  . Smoking status: Former Smoker    Last attempt to quit:  10/18/2009    Years since quitting: 8.3  . Smokeless tobacco: Former Systems developer    Types: Chew    Quit date: 10/18/1994  Substance Use Topics  . Alcohol use: Yes    Comment: occasionally  . Drug use: No     Allergies   Tape   Review of Systems Review of Systems  Constitutional: Negative for fever.  Respiratory: Positive for chest tightness. Negative for shortness of breath.   Cardiovascular: Positive for chest pain. Negative for leg swelling.  Gastrointestinal: Negative for abdominal pain, nausea and vomiting.  Genitourinary: Negative for dysuria.  Neurological: Negative for dizziness.  All other systems reviewed and are negative.    Physical Exam Updated Vital Signs BP 109/76   Pulse 70   Temp 98.6 F (37 C) (Temporal)   Resp 16   SpO2 92%   Physical Exam  Constitutional: He is oriented to person, place, and time. He appears well-developed and well-nourished. No distress.  HENT:  Head: Normocephalic and atraumatic.  Neck: Neck supple.  Cardiovascular: Normal rate, regular rhythm, normal heart sounds and normal pulses.  No murmur heard. No reproducible tenderness to palpation  Pulmonary/Chest: Effort normal and breath sounds normal. No respiratory distress. He has no wheezes.  Abdominal: Soft. Bowel sounds are normal. There is no tenderness. There is no rebound.  Musculoskeletal: He exhibits no edema.  Lymphadenopathy:    He has no cervical adenopathy.  Neurological: He is alert and oriented to person, place, and time.  Skin: Skin is warm and dry.  Psychiatric: He has a normal mood and affect.  Nursing note and vitals reviewed.    ED Treatments / Results  Labs (all labs ordered are listed, but only abnormal results are displayed) Labs Reviewed  BASIC METABOLIC PANEL - Abnormal; Notable for the following components:      Result Value   Glucose, Bld 118 (*)    All other components within normal limits  CBC WITH DIFFERENTIAL/PLATELET  I-STAT TROPONIN, ED  I-STAT  TROPONIN, ED    EKG #1      EKG EKG Interpretation  Date/Time:  Tuesday Feb 28 2018 03:12:12 EDT Ventricular Rate:  72 PR Interval:    QRS Duration: 82 QT Interval:  381 QTC Calculation: 417 R Axis:   81 Text Interpretation:  Sinus rhythm Borderline T abnormalities, inferior leads T wave inversions inferiorly Confirmed by Thayer Jew [phone]) on 02/28/2018 3:22:40 AM Also confirmed by Thayer Jew [phone]), editor Hattie Perch [id number])  on 02/28/2018 6:58:04 AM   Radiology Dg Chest 2 View  Result Date: 02/28/2018 CLINICAL DATA:  57 y/o  M; right-sided chest pain. EXAM: CHEST - 2 VIEW COMPARISON:  11/20/2016 chest radiograph FINDINGS: Normal cardiac silhouette. Clear lungs. No pleural effusion  or pneumothorax. No acute osseous abnormality is evident. IMPRESSION: No acute pulmonary process identified. Electronically Signed   By: Kristine Garbe M.D.   On: 02/28/2018 03:39    Procedures Procedures (including critical care time)  Medications Ordered in ED Medications  nitroGLYCERIN (NITROGLYN) 2 % ointment 1 inch (1 inch Topical Given 02/28/18 0315)  aspirin chewable tablet 324 mg (324 mg Oral Given 02/28/18 0315)  morphine 4 MG/ML injection 2 mg (2 mg Intravenous Given 02/28/18 0425)     Initial Impression / Assessment and Plan / ED Course  I have reviewed the triage vital signs and the nursing notes.  Pertinent labs & imaging results that were available during my care of the patient were reviewed by me and considered in my medical decision making (see chart for details).  Clinical Course as of Feb 29 728  Tue Feb 28, 2018  0432 Your work-up was reassuring.  On recheck, patient is resting comfortably.  Reports pain is improved but still present.  Morphine ordered.  I discussed with patient my concerns regarding his initial EKG.  However, his EKG has improved and his initial work-up is reassuring.  I recommend admission for formal chest pain rule out.   Patient is very hesitant as he reports that he needs to take his wife to very important appointment today.  Heart score is 3 for risk factors and EKG changes.  Will hold for repeat troponin at 4 hours.   [CH]    Clinical Course User Index [CH] Brenetta Penny, Barbette Hair, MD    Patient presents with chest pain.  Initially did not want to be evaluated but checked and given initial EKG.  Initial EKG uploaded picture.  Those changes resolved.  Patient symptoms are somewhat atypical but he has risk factors and a heart score of 3.  Initial troponin is negative.  No significant change with nitroglycerin.  Will recheck.  See clinical course above.  Patient is reluctant to be admitted for chest pain rule out.  EKG has remained stable.  I discussed with him the risk and benefits of discharge.  He is willing to stay for repeat troponin.  Repeat troponin ordered at 4 hours.    7:31 AM Repeat troponin is negative.  Patient updated.  Recommend very close follow-up and stress testing with cardiology.  He was advised to return immediately if anything changes.  After history, exam, and medical workup I feel the patient has been appropriately medically screened and is safe for discharge home. Pertinent diagnoses were discussed with the patient. Patient was given return precautions.     Final Clinical Impressions(s) / ED Diagnoses   Final diagnoses:  Atypical chest pain    ED Discharge Orders    None       Merryl Hacker, MD 02/28/18 [phone]

## 2018-02-28 NOTE — ED Notes (Signed)
Dr. Vanita Panda wanting to add on d-dimer. Ordered as add-on.

## 2018-03-01 ENCOUNTER — Observation Stay (HOSPITAL_BASED_OUTPATIENT_CLINIC_OR_DEPARTMENT_OTHER): Payer: 59

## 2018-03-01 ENCOUNTER — Encounter (HOSPITAL_COMMUNITY): Payer: Self-pay | Admitting: Cardiovascular Disease

## 2018-03-01 DIAGNOSIS — R079 Chest pain, unspecified: Secondary | ICD-10-CM | POA: Diagnosis not present

## 2018-03-01 DIAGNOSIS — I517 Cardiomegaly: Secondary | ICD-10-CM | POA: Diagnosis not present

## 2018-03-01 DIAGNOSIS — Z87891 Personal history of nicotine dependence: Secondary | ICD-10-CM | POA: Diagnosis not present

## 2018-03-01 DIAGNOSIS — K219 Gastro-esophageal reflux disease without esophagitis: Secondary | ICD-10-CM | POA: Diagnosis not present

## 2018-03-01 DIAGNOSIS — E119 Type 2 diabetes mellitus without complications: Secondary | ICD-10-CM | POA: Diagnosis not present

## 2018-03-01 DIAGNOSIS — E669 Obesity, unspecified: Secondary | ICD-10-CM | POA: Diagnosis not present

## 2018-03-01 DIAGNOSIS — R0789 Other chest pain: Secondary | ICD-10-CM | POA: Diagnosis not present

## 2018-03-01 DIAGNOSIS — N401 Enlarged prostate with lower urinary tract symptoms: Secondary | ICD-10-CM | POA: Diagnosis not present

## 2018-03-01 DIAGNOSIS — E785 Hyperlipidemia, unspecified: Secondary | ICD-10-CM | POA: Diagnosis not present

## 2018-03-01 DIAGNOSIS — G4733 Obstructive sleep apnea (adult) (pediatric): Secondary | ICD-10-CM | POA: Diagnosis not present

## 2018-03-01 LAB — HIV ANTIBODY (ROUTINE TESTING W REFLEX): HIV Screen 4th Generation wRfx: NONREACTIVE

## 2018-03-01 LAB — GLUCOSE, CAPILLARY
GLUCOSE-CAPILLARY: 103 mg/dL — AB (ref 65–99)
GLUCOSE-CAPILLARY: 158 mg/dL — AB (ref 65–99)

## 2018-03-01 LAB — ECHOCARDIOGRAM COMPLETE
Height: 72 in
WEIGHTICAEL: 3840 [oz_av]

## 2018-03-01 LAB — TROPONIN I: Troponin I: <0.03 ng/mL (ref <0.03)

## 2018-03-01 MED ORDER — AMLODIPINE BESYLATE 5 MG PO TABS: 5.0000 mg | ORAL_TABLET | Freq: Every day | ORAL | 1 refills | Status: DC

## 2018-03-01 MED FILL — Heparin Sod (Porcine)-NaCl IV Soln 1000 Unit/500ML-0.9%: INTRAVENOUS | Qty: 1000 | Status: AC

## 2018-03-01 MED FILL — AMLODIPINE BESYLATE 5 MG TA: 5 | 30 days supply | Qty: 30 | Fill #0

## 2018-03-01 NOTE — Progress Notes (Signed)
  Echocardiogram 2D Echocardiogram has been performed.  Bobbye Charleston 03/01/2018, 2:43 PM

## 2018-03-01 NOTE — Discharge Summary (Signed)
Discharge Summary    Patient ID: Marc Aguilar,  MRN: [id number], DOB/AGE: [birth year] 57 y.o.  Admit date: 02/28/2018 Discharge date: 03/01/2018  Primary Care Provider: [name] Primary Cardiologist: (New) Dr. Ellyn Hack  Discharge Diagnoses    Active Problems:   Chest pain with moderate risk for cardiac etiology   Allergies Allergies  Allergen Reactions  . Tape Rash    Paper tape-blisters    Diagnostic Studies/Procedures    Cath: 02/28/18  Conclusion     The left ventricular ejection fraction is 55-65% by visual estimate.  LV end diastolic pressure is normal.  The left ventricular systolic function is normal.   1. Widely patent coronary arteries with minimal coronary irregularities 2. Normal LV systolic function with normal LVEDP  Diff dx includes coronary vasospasm versus noncardiac pain as most likely etiology of chest pain. Further management/evaluation per primary cardiology team.     TTE: 03/01/18  Study Conclusions  - Left ventricle: The cavity size was normal. There was mild   concentric hypertrophy. Systolic function was vigorous. The   estimated ejection fraction was in the range of 65% to 70%. Wall   motion was normal; there were no regional wall motion   abnormalities. Left ventricular diastolic function parameters   were normal. - Pulmonary arteries: Systolic pressure could not be accurately   estimated. _____________   History of Present Illness     [name] is a 57yo male with PMH of HL, DM, OSA on Cpap and BPH who was in his usual state of health until the night prior to admission when he developed sudden onset right-sided chest pain radiating to his right scapula. He stated he was taking a patients vitals when the pain started, not doing anything strenuous. He denied associated diaphoresis, dizziness, lightheadedness, nausea, vomiting, SOB, or syncope. He stated the pain was initially an 8/10. He is a Designer, multimedia in the emergency  department and had a staff member perform an EKG (picture in Thayer Jew, MD note 02/28/18) at the time of initial chest pain which was reportedly more severe than when the repeat EKG was obtained at 3:12am. His pain was relieved with morphine; he noted no change in pain with nitroglycerin paste. He noted some exacerbation of pain with movement but his chest did not hurt to touch. The following morning he was discharged from the ED after 2 negative troponins and scheduled to follow-up with cardiology 03/17/18, however after leaving, he had a reoccurrence of chest pain which prompted him to return to the ED for further evaluation.    He denied prior cardiac history and does not currently follow with a cardiologist. He reported a similar episode of chest pain in 2016 for which he was evaluated at [facility] and underwent a NST which showed mild reversible myocardial ischemia involving apical segment but no evidence of MI, and EF was estimated at 56%; reportedly reviewed with a cardiologist at [facility] who felt there were no signs of reversible ischemia. He denied family history of heart disease, MI, or CHF. He is a former smoker, quit in 2011. He has OSA and reports compliance with his CPAP machine.  ED course: VSS. Labs notable for electrolytes wnl, Cr 1.04, Hgb 14.4, PLT 242, Trop negative x2, Ddimer wnl. CXR without acute findings. EKG prior to evaluation with sinus rhythm with STE in V2-3 and STD in inferior leads. Repeat EKGs at 3:12am the day of admission with sinus rhythm with TWI III and aVF, and submm STE  V2; and at 9:40am with sinus rhythm with baseline artifact in inferior leads making it difficult to interpret, otherwise isolated TWI V6. Patient was initially given nitro ointment which did not help and morphine which ultimately relieved his pain. He was discharged home with cardiology follow-up, however represented a few hours later due to reoccurrence of pain. Cardiology admitted for further evaluation of  chest pain.    Hospital Course     Consultants: None   Decision was made to take for cath given his initial abnormal EKG in the setting of chest pain. It was felt that stress test would not be helpful given he had one the in the past which was abnormal. Underwent cardiac cath noted above with widely patent coronary arteries with normal LV function and normal LVEDP. Trop neg x3. Follow echo showed normal EF with no WMA.   General: Well developed, well nourished, male appearing in no acute distress. Head: Normocephalic, atraumatic.  Neck: Supple without bruits, JVD. Lungs:  Resp regular and unlabored, CTA. Heart: RRR, S1, S2, no S3, S4, or murmur; no rub. Abdomen: Soft, non-tender, non-distended with normoactive bowel sounds. Extremities: No clubbing, cyanosis, edema. Distal pedal pulses are 2+ bilaterally. R radial cath site stable without bruising or hematoma Neuro: Alert and oriented X 3. Moves all extremities spontaneously. Psych: Normal affect.  Marc Aguilar was seen by Dr. Ellyn Hack and determined stable for discharge home. Follow up in the office has been arranged. Medications are listed below.   _____________  Discharge Vitals Blood pressure 114/77, pulse 81, temperature 98 F (36.7 C), temperature source Oral, resp. rate 19, height 6' (1.829 m), weight 240 lb (108.9 kg), SpO2 99 %.  Filed Weights   02/28/18 1229  Weight: 240 lb (108.9 kg)    Labs & Radiologic Studies    CBC Recent Labs    02/28/18 0300 02/28/18 0949 02/28/18 1723  WBC 5.3 4.5 4.3  NEUTROABS 1.7  --   --   HGB 14.4 14.4 14.4  HCT 42.8 44.4 44.1  MCV 78.1 77.9* 78.3  PLT 221 242 423   Basic Metabolic Panel Recent Labs    02/28/18 0300 02/28/18 0949 02/28/18 1723  NA 139 138  --   K 4.1 4.0  --   CL 104 103  --   CO2 26 27  --   GLUCOSE 118* 174*  --   BUN 10 14  --   CREATININE 1.09 1.04 0.95  CALCIUM 9.6 9.5  --    Liver Function Tests No results for input(s): AST, ALT, ALKPHOS,  BILITOT, PROT, ALBUMIN in the last 72 hours. No results for input(s): LIPASE, AMYLASE in the last 72 hours. Cardiac Enzymes Recent Labs    02/28/18 1723 02/28/18 2253 03/01/18 0606  TROPONINI <0.03 <0.03 <0.03   BNP Invalid input(s): POCBNP D-Dimer Recent Labs    02/28/18 0300  DDIMER 0.39   Hemoglobin A1C No results for input(s): HGBA1C in the last 72 hours. Fasting Lipid Panel No results for input(s): CHOL, HDL, LDLCALC, TRIG, CHOLHDL, LDLDIRECT in the last 72 hours. Thyroid Function Tests No results for input(s): TSH, T4TOTAL, T3FREE, THYROIDAB in the last 72 hours.  Invalid input(s): FREET3 _____________  Dg Chest 2 View  Result Date: 02/28/2018 CLINICAL DATA:  57 y/o  M; right-sided chest pain. EXAM: CHEST - 2 VIEW COMPARISON:  11/20/2016 chest radiograph FINDINGS: Normal cardiac silhouette. Clear lungs. No pleural effusion or pneumothorax. No acute osseous abnormality is evident. IMPRESSION: No acute pulmonary process identified. Electronically  Signed   By: Kristine Garbe M.D.   On: 02/28/2018 03:39   Disposition   Pt is being discharged home today in good condition.  Follow-up Plans & Appointments    Follow-up Information    [facility], [place] Follow up on 03/22/2018.   Specialties:  Cardiology, Radiology Why:  at 10am for follow up appt.  Contact information: [address] [place] [address] [phone]          Discharge Instructions    Call MD for:  redness, tenderness, or signs of infection (pain, swelling, redness, odor or green/yellow discharge around incision site)   Complete by:  As directed    Diet - low sodium heart healthy   Complete by:  As directed    Discharge instructions   Complete by:  As directed    Radial Site Care Refer to this sheet in the next few weeks. These instructions provide you with information on caring for yourself after your procedure. Your caregiver may also give you more specific instructions.  Your treatment has been planned according to current medical practices, but problems sometimes occur. Call your caregiver if you have any problems or questions after your procedure. HOME CARE INSTRUCTIONS You may shower the day after the procedure.Remove the bandage (dressing) and gently wash the site with plain soap and water.Gently pat the site dry.  Do not apply powder or lotion to the site.  Do not submerge the affected site in water for 3 to 5 days.  Inspect the site at least twice daily.  Do not flex or bend the affected arm for 24 hours.  No lifting over 5 pounds (2.3 kg) for 5 days after your procedure.  Do not drive home if you are discharged the same day of the procedure. Have someone else drive you.  You may drive 24 hours after the procedure unless otherwise instructed by your caregiver.  What to expect: Any bruising will usually fade within 1 to 2 weeks.  Blood that collects in the tissue (hematoma) may be painful to the touch. It should usually decrease in size and tenderness within 1 to 2 weeks.  SEEK IMMEDIATE MEDICAL CARE IF: You have unusual pain at the radial site.  You have redness, warmth, swelling, or pain at the radial site.  You have drainage (other than a small amount of blood on the dressing).  You have chills.  You have a fever or persistent symptoms for more than 72 hours.  You have a fever and your symptoms suddenly get worse.  Your arm becomes pale, cool, tingly, or numb.  You have heavy bleeding from the site. Hold pressure on the site.   Increase activity slowly   Complete by:  As directed      Discharge Medications     Medication List    TAKE these medications   ACCU-CHEK FASTCLIX LANCETS Misc USE TWICE A DAY AS DIRECTED   Alcohol Prep Pads 1 each by Does not apply route 2 (two) times daily.   amLODipine 5 MG tablet Commonly known as:  NORVASC Take 1 tablet (5 mg total) by mouth daily.   aspirin EC 81 MG tablet Take 81 mg by mouth daily.    glucose blood test strip Commonly known as:  ACCU-CHEK GUIDE 1 each by Other route 2 (two) times daily. Use as instructed   hydroxypropyl methylcellulose / hypromellose 2.5 % ophthalmic solution Commonly known as:  ISOPTO TEARS / GONIOVISC Place 1 drop into both eyes  as needed for dry eyes.   latanoprost 0.005 % ophthalmic solution Commonly known as:  XALATAN Place 1 drop into both eyes at bedtime.   metFORMIN 1000 MG tablet Commonly known as:  GLUCOPHAGE TAKE 1 TABLET BY MOUTH 2 TIMES DAILY WITH A MEAL   multivitamin with minerals tablet Take 1 tablet by mouth daily.   omega-3 acid ethyl esters 1 g capsule Commonly known as:  LOVAZA Take 1 g by mouth daily.   PROTEIN SUPPLEMENT 80% PO Take 8 oz by mouth daily.   tamsulosin 0.4 MG Caps capsule Commonly known as:  FLOMAX Take 1 capsule (0.4 mg total) by mouth daily.      Outstanding Labs/Studies   N/a  Duration of Discharge Encounter   Greater than 30 minutes including physician time.  Signed, Reino Bellis NP-C 03/01/2018, 3:35 PM

## 2018-03-11 NOTE — Progress Notes (Deleted)
Cardiology Office Note   Date:  03/11/2018   ID:  Marc, Aguilar [birth year], MRN [id number]  PCP:  [name], NP  Cardiologist:   Soliana Kitko Martinique, MD   No chief complaint on file.     History of Present Illness: Marc Aguilar is a 57 y.o. male who is seen for follow up  of chest pain. He has a history of DM type 2, Hypercholesterolemia, and OSA on CPAP. He is a former smoker. He was admitted 5/14/-03/01/18 with atypical chest pain. He ruled out for MI. He did have some dynamic Ecg changes. Echo was normal. Cardiac cath showed no obstructive CAD. He was DC on medical therapy including amlodipine and ASA.     Past Medical History:  Diagnosis Date  . Arthritis    "knees and hips" (02/28/2018)  . Chicken pox   . GERD (gastroesophageal reflux disease)   . Heart murmur   . Hernia, abdominal   . High cholesterol   . History of gout   . Migraine    "probably monthly" (02/28/2018)  . OSA on CPAP   . Pneumonia    "once" (02/28/2018)  . Refusal of blood transfusions as patient is Jehovah's Witness   . Seasonal allergies   . Type II diabetes mellitus ([place])     Past Surgical History:  Procedure Laterality Date  . CARDIAC CATHETERIZATION  02/28/2018  . LEFT HEART CATH AND CORONARY ANGIOGRAPHY N/A 02/28/2018   Procedure: LEFT HEART CATH AND CORONARY ANGIOGRAPHY;  Surgeon: Sherren Mocha, MD;  Location: [facility];  Service: Cardiovascular;  Laterality: N/A;  . WRIST GANGLION EXCISION Left 1984     Current Outpatient Medications  Medication Sig Dispense Refill  . ACCU-CHEK FASTCLIX LANCETS MISC USE TWICE A DAY AS DIRECTED 204 each 3  . Alcohol Swabs (ALCOHOL PREP) PADS 1 each by Does not apply route 2 (two) times daily. 200 each 3  . amLODipine (NORVASC) 5 MG tablet Take 1 tablet (5 mg total) by mouth daily. 30 tablet 1  . aspirin EC 81 MG tablet Take 81 mg by mouth daily.    Marland Kitchen glucose blood (ACCU-CHEK GUIDE) test strip 1 each by Other route 2 (two) times daily.  Use as instructed 200 each 3  . hydroxypropyl methylcellulose / hypromellose (ISOPTO TEARS / GONIOVISC) 2.5 % ophthalmic solution Place 1 drop into both eyes as needed for dry eyes.    Marland Kitchen latanoprost (XALATAN) 0.005 % ophthalmic solution Place 1 drop into both eyes at bedtime.   6  . metFORMIN (GLUCOPHAGE) 1000 MG tablet TAKE 1 TABLET BY MOUTH 2 TIMES DAILY WITH A MEAL 180 tablet 3  . Multiple Vitamins-Minerals (MULTIVITAMIN WITH MINERALS) tablet Take 1 tablet by mouth daily.    . Nutritional Supplements (PROTEIN SUPPLEMENT 80% PO) Take 8 oz by mouth daily.    Marland Kitchen omega-3 acid ethyl esters (LOVAZA) 1 g capsule Take 1 g by mouth daily.    . tamsulosin (FLOMAX) 0.4 MG CAPS capsule Take 1 capsule (0.4 mg total) by mouth daily. 90 capsule 3   No current facility-administered medications for this visit.     Allergies:   Tape    Social History:  The patient  reports that he quit smoking about 8 years ago. His smoking use included cigarettes. He has a 27.00 pack-year smoking history. He quit smokeless tobacco use about 23 years ago. His smokeless tobacco use included chew. He reports that he drinks about 1.2 oz of alcohol  per week. He reports that he does not use drugs.   Family History:  The patient's family history includes Arthritis in his maternal grandfather, maternal grandmother, and paternal grandmother; Bladder Cancer in his father; Diabetes in his brother, maternal aunt, maternal grandmother, mother, and sister; Heart disease in his brother; Hyperlipidemia in his mother; Liver cancer in his sister.    ROS:  Please see the history of present illness.   Otherwise, review of systems are positive for none.   All other systems are reviewed and negative.    PHYSICAL EXAM: VS:  There were no vitals taken for this visit. , BMI There is no height or weight on file to calculate BMI. GEN: Well nourished, well developed, in no acute distress  HEENT: normal  Neck: no JVD, carotid bruits, or  masses Cardiac: RRR; no murmurs, rubs, or gallops,no edema  Respiratory:  clear to auscultation bilaterally, normal work of breathing GI: soft, nontender, nondistended, + BS MS: no deformity or atrophy  Skin: warm and dry, no rash Neuro:  Strength and sensation are intact Psych: euthymic mood, full affect   EKG:  EKG {ACTION; IS/IS PZW:[id number]} ordered today. The ekg ordered today demonstrates ***   Recent Labs: 11/10/2017: ALT 15 02/28/2018: BUN 14; Creatinine, Ser 0.95; Hemoglobin 14.4; Platelets 232; Potassium 4.0; Sodium 138    Lipid Panel    Component Value Date/Time   CHOL 167 11/10/2017 0840   TRIG 59.0 11/10/2017 0840   HDL 44.20 11/10/2017 0840   CHOLHDL 4 11/10/2017 0840   VLDL 11.8 11/10/2017 0840   LDLCALC 111 (H) 11/10/2017 0840   LDLDIRECT 143.5 10/25/2013 1602      Wt Readings from Last 3 Encounters:  02/28/18 240 lb (108.9 kg)  11/10/17 234 lb (106.1 kg)  03/28/17 237 lb (107.5 kg)      Other studies Reviewed: Additional studies/ records that were reviewed today include:   Procedures   LEFT HEART CATH AND CORONARY ANGIOGRAPHY  Conclusion     The left ventricular ejection fraction is 55-65% by visual estimate.  LV end diastolic pressure is normal.  The left ventricular systolic function is normal.   1. Widely patent coronary arteries with minimal coronary irregularities 2. Normal LV systolic function with normal LVEDP  Diff dx includes coronary vasospasm versus noncardiac pain as most likely etiology of chest pain. Further management/evaluation per primary cardiology team.    Echo 03/01/18: Study Conclusions  - Left ventricle: The cavity size was normal. There was mild   concentric hypertrophy. Systolic function was vigorous. The   estimated ejection fraction was in the range of 65% to 70%. Wall   motion was normal; there were no regional wall motion   abnormalities. Left ventricular diastolic function parameters   were normal. -  Pulmonary arteries: Systolic pressure could not be accurately   estimated.    ASSESSMENT AND PLAN:  1.  ***   Current medicines are reviewed at length with the patient today.  The patient {ACTIONS; HAS/DOES NOT HAVE:19233} concerns regarding medicines.  The following changes have been made:  {PLAN; NO CHANGE:13088:s}  Labs/ tests ordered today include: *** No orders of the defined types were placed in this encounter.    Disposition:   FU with *** in {gen number 2-42:[id number]} {Days to years:10300}  Signed, Astou Lada Martinique, MD  03/11/2018 3:58 PM    [facility] [address] [place], [place], [address] Phone [phone], Fax [phone]

## 2018-03-14 ENCOUNTER — Telehealth: Payer: Self-pay | Admitting: Cardiology

## 2018-03-14 NOTE — Telephone Encounter (Signed)
Returned call to patient spoke to [employer] PA.Patient is cleared to return to work tonight 03/14/18 with no restrictions.Return to work letter sent to [employer].

## 2018-03-14 NOTE — Telephone Encounter (Signed)
New Message:    Pt says he needs a letter for work stating that it is alright for him to work at the the [employer].Pt had a Cath on on 02-27-18 and it was good. Pt returns to work tonight,he needs the letter for tonight if possible please.

## 2018-03-17 ENCOUNTER — Ambulatory Visit: Payer: 59 | Admitting: Cardiology

## 2018-03-22 ENCOUNTER — Ambulatory Visit (INDEPENDENT_AMBULATORY_CARE_PROVIDER_SITE_OTHER): Payer: 59 | Admitting: Physician Assistant

## 2018-03-22 ENCOUNTER — Encounter: Payer: Self-pay | Admitting: Physician Assistant

## 2018-03-22 ENCOUNTER — Ambulatory Visit: Payer: Self-pay | Admitting: Physician Assistant

## 2018-03-22 VITALS — BP 124/84 | HR 84 | Ht 72.0 in | Wt 244.0 lb

## 2018-03-22 DIAGNOSIS — E785 Hyperlipidemia, unspecified: Secondary | ICD-10-CM

## 2018-03-22 DIAGNOSIS — R079 Chest pain, unspecified: Secondary | ICD-10-CM | POA: Diagnosis not present

## 2018-03-22 DIAGNOSIS — M79604 Pain in right leg: Secondary | ICD-10-CM

## 2018-03-22 DIAGNOSIS — E119 Type 2 diabetes mellitus without complications: Secondary | ICD-10-CM | POA: Diagnosis not present

## 2018-03-22 DIAGNOSIS — Z9989 Dependence on other enabling machines and devices: Secondary | ICD-10-CM | POA: Diagnosis not present

## 2018-03-22 DIAGNOSIS — M79605 Pain in left leg: Secondary | ICD-10-CM

## 2018-03-22 DIAGNOSIS — G4733 Obstructive sleep apnea (adult) (pediatric): Secondary | ICD-10-CM

## 2018-03-22 NOTE — Progress Notes (Signed)
Cardiology Office Note    Date:  03/22/2018   ID:  Marc Aguilar, DOB Jul 07, 1961, MRN [id number]  PCP:  Martinique, Peter M, MD  Cardiologist:  Dr. Martinique  Chief Complaint  Patient presents with  . Follow-up    seen for Dr. Martinique.     History of Present Illness:  Marc Aguilar is a 57 y.o. male with PMH of HLD, DM II, OSA on CPAP and BPH.  Patient was recently admitted on 02/28/2018 with right-sided chest pain radiating to his scapula.  He also reported a similar episode of chest pain in 2016 for which he was evaluated at [facility] and underwent nuclear stress test which showed mild reversible ischemia involving apical segment but no evidence of MI, EF was 56%.  This Myoview was later over read by want to do cardiologist who felt that there was no signs of reversible ischemia.  Upon presenting to the ED, initial EKG showed ST elevation in V2 and V3 with ST depression in the inferior leads.  Initial troponin was negative.  He underwent cardiac catheterization on 02/28/2018 which showed widely patent coronary arteries with minimal coronary irregularities, normal EF with normal LVEDP.  Differential diagnosis include coronary spasm versus noncardiac chest pain.  Echocardiogram obtained on 03/01/2018 also showed EF 65 to 70%, mild LVH.  He was started on low-dose Norvasc.  Patient presents today for cardiology office visit.  He still has intermittent discomfort at rest, this does not seems to recur with exertion.  He is tolerating the current medication without significant issue.  He has no lower extremity edema, orthopnea or PND.  One of his primary concern today is bilateral thigh pain with walking.  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  He has good dorsalis pedis pulse, however bilateral posterior tibial pulse was weak.  I recommended a screening ABI.  However this likely represent musculoskeletal pain as  well.   Past Medical History:  Diagnosis Date  . Arthritis    "knees and hips" (02/28/2018)  . Chicken pox   . GERD (gastroesophageal reflux disease)   . Heart murmur   . Hernia, abdominal   . High cholesterol   . History of gout   . Migraine    "probably monthly" (02/28/2018)  . OSA on CPAP   . Pneumonia    "once" (02/28/2018)  . Refusal of blood transfusions as patient is Jehovah's Witness   . Seasonal allergies   . Type II diabetes mellitus ([place])     Past Surgical History:  Procedure Laterality Date  . CARDIAC CATHETERIZATION  02/28/2018  . LEFT HEART CATH AND CORONARY ANGIOGRAPHY N/A 02/28/2018   Procedure: LEFT HEART CATH AND CORONARY ANGIOGRAPHY;  Surgeon: Sherren Mocha, MD;  Location: [facility];  Service: Cardiovascular;  Laterality: N/A;  . WRIST GANGLION EXCISION Left 1984    Current Medications: Outpatient Medications Prior to Visit  Medication Sig Dispense Refill  . ACCU-CHEK FASTCLIX LANCETS MISC USE TWICE A DAY AS DIRECTED 204 each 3  . Alcohol Swabs (ALCOHOL PREP) PADS 1 each by Does not apply route 2 (two) times daily. 200 each 3  . amLODipine (NORVASC) 5 MG tablet Take 1 tablet (5 mg total) by mouth daily. 30 tablet 1  . aspirin EC 81 MG tablet Take 81 mg by mouth daily.    Marland Kitchen glucose blood (ACCU-CHEK GUIDE) test strip  1 each by Other route 2 (two) times daily. Use as instructed 200 each 3  . hydroxypropyl methylcellulose / hypromellose (ISOPTO TEARS / GONIOVISC) 2.5 % ophthalmic solution Place 1 drop into both eyes as needed for dry eyes.    Marland Kitchen latanoprost (XALATAN) 0.005 % ophthalmic solution Place 1 drop into both eyes at bedtime.   6  . metFORMIN (GLUCOPHAGE) 1000 MG tablet TAKE 1 TABLET BY MOUTH 2 TIMES DAILY WITH A MEAL 180 tablet 3  . Multiple Vitamins-Minerals (MULTIVITAMIN WITH MINERALS) tablet Take 1 tablet by mouth daily.    . Nutritional Supplements (PROTEIN SUPPLEMENT 80% PO) Take 8 oz by mouth daily.    Marland Kitchen omega-3 acid ethyl esters  (LOVAZA) 1 g capsule Take 1 g by mouth daily.    . tamsulosin (FLOMAX) 0.4 MG CAPS capsule Take 1 capsule (0.4 mg total) by mouth daily. 90 capsule 3   No facility-administered medications prior to visit.      Allergies:   Tape   Social History   Socioeconomic History  . Marital status: Married    Spouse name: Not on file  . Number of children: 2  . Years of education: Not on file  . Highest education level: Not on file  Occupational History  . 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 Financial resource strain: Not on file  . Food insecurity:    Worry: Not on file    Inability: Not on file  . Transportation needs:    Medical: Not on file    Non-medical: Not on file  Tobacco Use  . Smoking status: Former Smoker    Packs/day: 1.00    Years: 27.00    Pack years: 27.00    Types: Cigarettes    Last attempt to quit: 10/18/2009    Years since quitting: 8.4  . Smokeless tobacco: Former Systems developer    Types: [place] date: 10/18/1994  Substance and Sexual Activity  . Alcohol use: Yes    Alcohol/week: 1.2 oz    Types: 2 Cans of beer per week  . Drug use: No  . Sexual activity: Not Currently  Lifestyle  . Physical activity:    Days per week: Not on file    Minutes per session: Not on file  . Stress: Not on file  Relationships  . Social connections:    Talks on phone: Not on file    Gets together: Not on file    Attends religious service: Not on file    Active member of club or organization: Not on file    Attends meetings of clubs or organizations: Not on file    Relationship status: Not on file  Other Topics Concern  . Not on file  Social History Narrative  . Not on file     Family History:  The patient's family history includes Arthritis in his maternal grandfather, maternal grandmother, and paternal grandmother; Bladder Cancer in his father; Diabetes in his brother, maternal aunt, maternal grandmother, mother, and sister; Heart disease in his brother; Hyperlipidemia in  his mother; Liver cancer in his sister.   ROS:   Please see the history of present illness.    ROS All other systems reviewed and are negative.   PHYSICAL EXAM:   VS:  BP 124/84   Pulse 84   Ht 6' (1.829 m)   Wt 244 lb (110.7 kg)   BMI 33.09 kg/m    GEN: Well nourished, well developed, in no acute distress  HEENT: normal  Neck: no JVD, carotid bruits, or masses Cardiac: RRR; no murmurs, rubs, or gallops,no edema  Respiratory:  clear to auscultation bilaterally, normal work of breathing GI: soft, nontender, nondistended, + BS MS: no deformity or atrophy  Skin: warm and dry, no rash Neuro:  Alert and Oriented x 3, Strength and sensation are intact Psych: euthymic mood, full affect  Wt Readings from Last 3 Encounters:  03/22/18 244 lb (110.7 kg)  02/28/18 240 lb (108.9 kg)  11/10/17 234 lb (106.1 kg)      Studies/Labs Reviewed:   EKG:  EKG is not ordered today.    Recent Labs: 11/10/2017: ALT 15 02/28/2018: BUN 14; Creatinine, Ser 0.95; Hemoglobin 14.4; Platelets 232; Potassium 4.0; Sodium 138   Lipid Panel    Component Value Date/Time   CHOL 167 11/10/2017 0840   TRIG 59.0 11/10/2017 0840   HDL 44.20 11/10/2017 0840   CHOLHDL 4 11/10/2017 0840   VLDL 11.8 11/10/2017 0840   LDLCALC 111 (H) 11/10/2017 0840   LDLDIRECT 143.5 10/25/2013 1602    Additional studies/ records that were reviewed today include:   Cath 02/28/2018 Conclusion     The left ventricular ejection fraction is 55-65% by visual estimate.  LV end diastolic pressure is normal.  The left ventricular systolic function is normal.   1. Widely patent coronary arteries with minimal coronary irregularities 2. Normal LV systolic function with normal LVEDP  Diff dx includes coronary vasospasm versus noncardiac pain as most likely etiology of chest pain. Further management/evaluation per primary cardiology team.      Echo 03/01/2018  LV EF: 65% -   70% Study Conclusions  - Left ventricle:  The cavity size was normal. There was mild   concentric hypertrophy. Systolic function was vigorous. The   estimated ejection fraction was in the range of 65% to 70%. Wall   motion was normal; there were no regional wall motion   abnormalities. Left ventricular diastolic function parameters   were normal. - Pulmonary arteries: Systolic pressure could not be accurately   estimated.     ASSESSMENT:    1. Bilateral leg pain   2. Hyperlipidemia, unspecified hyperlipidemia type   3. Controlled type 2 diabetes mellitus without complication, without long-term current use of insulin ([place])   4. OSA on CPAP   5. Chest pain, unspecified type      PLAN:  In order of problems listed above:  1. Bilateral leg pain: This occurs near the end of his shift while walking around.  I will obtain screening ABI, he has weak posterior tibial pulse on exam, however he has noticeable dorsalis pedis pulse.  It is possible that his symptom is related to muscle ache secondary to long period of standing  2. Chest pain: Due to abnormal EKG, he had extensive work-up recently during the hospitalization including cardiac catheterization and echocardiogram.  Both were normal  3. DM 2: Managed by primary care provider  4. Obstructive sleep apnea on CPAP: Compliant with CPAP therapy  5. Hyperlipidemia: He is not on statin, he is taking Lovaza.  Previous lab work shows significant improvement of LDL compared to last year, recommend continued diet and exercise.    Medication Adjustments/Labs and Tests Ordered: Current medicines are reviewed at length with the patient today.  Concerns regarding medicines are outlined above.  Medication changes, Labs and Tests ordered today are listed in the Patient Instructions below. Patient Instructions  Medication Instructions:  Your physician recommends that you continue on your  current medications as directed. Please refer to the Current Medication list given to you  today.  Labwork: None    Testing/Procedures: Your physician has requested that you have an ankle brachial index (ABI). During this test an ultrasound and blood pressure cuff are used to evaluate the arteries that supply the arms and legs with blood. Allow thirty minutes for this exam. There are no restrictions or special instructions.  Follow-Up: Your physician wants you to follow-up in: 6-9 months with Dr Ellyn Hack. You will receive a reminder letter in the mail two months in advance. If you don't receive a letter, please call our office to schedule the follow-up appointment.  Any Other Special Instructions Will Be Listed Below (If Applicable). If you need a refill on your cardiac medications before your next appointment, please call your pharmacy.     Hilbert Corrigan, [place]  03/22/2018 1:37 PM    [facility] [place], [place], [place]  [address] Phone: [phone]; Fax: [phone]

## 2018-03-22 NOTE — Patient Instructions (Signed)
Medication Instructions:  Your physician recommends that you continue on your current medications as directed. Please refer to the Current Medication list given to you today.  Labwork: None    Testing/Procedures: Your physician has requested that you have an ankle brachial index (ABI). During this test an ultrasound and blood pressure cuff are used to evaluate the arteries that supply the arms and legs with blood. Allow thirty minutes for this exam. There are no restrictions or special instructions.  Follow-Up: Your physician wants you to follow-up in: 6-9 months with Dr Ellyn Hack. You will receive a reminder letter in the mail two months in advance. If you don't receive a letter, please call our office to schedule the follow-up appointment.  Any Other Special Instructions Will Be Listed Below (If Applicable). If you need a refill on your cardiac medications before your next appointment, please call your pharmacy.

## 2018-03-24 ENCOUNTER — Other Ambulatory Visit: Payer: Self-pay | Admitting: Physician Assistant

## 2018-03-24 DIAGNOSIS — M79605 Pain in left leg: Principal | ICD-10-CM

## 2018-03-24 DIAGNOSIS — M79604 Pain in right leg: Secondary | ICD-10-CM

## 2018-03-24 DIAGNOSIS — R0989 Other specified symptoms and signs involving the circulatory and respiratory systems: Secondary | ICD-10-CM

## 2018-03-24 DIAGNOSIS — I739 Peripheral vascular disease, unspecified: Secondary | ICD-10-CM

## 2018-04-03 MED FILL — AMLODIPINE BESYLATE 5 MG TA: 5 | 30 days supply | Qty: 30 | Fill #1

## 2018-04-04 ENCOUNTER — Ambulatory Visit (HOSPITAL_COMMUNITY)
Admission: RE | Admit: 2018-04-04 | Discharge: 2018-04-04 | Disposition: A | Discharge status: Home / Self Care | Payer: 59 | Source: Ambulatory Visit | Attending: Cardiovascular Disease | Admitting: Cardiovascular Disease

## 2018-04-04 DIAGNOSIS — R0989 Other specified symptoms and signs involving the circulatory and respiratory systems: Secondary | ICD-10-CM

## 2018-04-04 DIAGNOSIS — E785 Hyperlipidemia, unspecified: Secondary | ICD-10-CM | POA: Diagnosis not present

## 2018-04-04 DIAGNOSIS — M79604 Pain in right leg: Secondary | ICD-10-CM | POA: Diagnosis not present

## 2018-04-04 DIAGNOSIS — I739 Peripheral vascular disease, unspecified: Secondary | ICD-10-CM | POA: Diagnosis not present

## 2018-04-04 DIAGNOSIS — M79605 Pain in left leg: Secondary | ICD-10-CM | POA: Diagnosis not present

## 2018-04-04 DIAGNOSIS — E1151 Type 2 diabetes mellitus with diabetic peripheral angiopathy without gangrene: Secondary | ICD-10-CM | POA: Insufficient documentation

## 2018-04-04 DIAGNOSIS — Z87891 Personal history of nicotine dependence: Secondary | ICD-10-CM | POA: Insufficient documentation

## 2018-04-05 NOTE — Progress Notes (Signed)
Normal right side ABI, left leg was noncompressible likely due to some degree of calcification, however flow doppler showed triphasic flow which is indicative of no significant resistance to the flow. Overall, ultrasound shows good blood flow to the leg, his leg pain is not due to blood flow issue. Likely related to musculoskeletal pain

## 2018-04-06 ENCOUNTER — Telehealth: Payer: Self-pay | Admitting: Physician Assistant

## 2018-04-06 NOTE — Telephone Encounter (Signed)
New Message     Patient is calling in reference to his le arterial test results. Please call to discuss.

## 2018-04-06 NOTE — Telephone Encounter (Signed)
Pt aware of le arterial results .Marc Aguilar

## 2018-04-17 ENCOUNTER — Emergency Department (HOSPITAL_COMMUNITY)
Admission: EM | Admit: 2018-04-17 | Discharge: 2018-04-17 | Disposition: A | Discharge status: Home / Self Care | Payer: 59 | Attending: Emergency Medicine | Admitting: Emergency Medicine

## 2018-04-17 ENCOUNTER — Encounter (HOSPITAL_COMMUNITY): Payer: Self-pay | Admitting: Emergency Medicine

## 2018-04-17 DIAGNOSIS — E119 Type 2 diabetes mellitus without complications: Secondary | ICD-10-CM | POA: Diagnosis not present

## 2018-04-17 DIAGNOSIS — R040 Epistaxis: Secondary | ICD-10-CM | POA: Insufficient documentation

## 2018-04-17 DIAGNOSIS — Z87891 Personal history of nicotine dependence: Secondary | ICD-10-CM | POA: Diagnosis not present

## 2018-04-17 LAB — I-STAT CHEM 8, ED
BUN: 20 mg/dL (ref 6–20)
CALCIUM ION: 1.18 mmol/L (ref 1.15–1.40)
CHLORIDE: 105 mmol/L (ref 98–111)
Creatinine, Ser: 0.9 mg/dL (ref 0.61–1.24)
Glucose, Bld: 101 mg/dL — ABNORMAL HIGH (ref 70–99)
HCT: 45 % (ref 39.0–52.0)
Hemoglobin: 15.3 g/dL (ref 13.0–17.0)
POTASSIUM: 3.9 mmol/L (ref 3.5–5.1)
Sodium: 141 mmol/L (ref 135–145)
TCO2: 24 mmol/L (ref 22–32)

## 2018-04-17 MED ORDER — OXYMETAZOLINE HCL 0.05 % NA SOLN
1.0000 | Freq: Once | NASAL | Status: AC
  Administered 2018-04-17: 1 via NASAL
  Filled 2018-04-17: qty 15

## 2018-04-17 NOTE — ED Triage Notes (Signed)
Pt complains of nose bleed that started at the end of his night shift. Pt states he was bleeding on his drive home this morning. Bleeding stopped later this morning. Then, pt states the nose bleed started again this afternoon around 4pm. Pt came in to work this evening and continued to have bleeding. Pt feels "swimmy headed" and just "not right." Pt is alert and oriented.

## 2018-04-17 NOTE — ED Notes (Addendum)
No bleeding (nose)  noted at this time.

## 2018-04-17 NOTE — ED Provider Notes (Signed)
Emergency Department Provider Note   I have reviewed the triage vital signs and the nursing notes.   HISTORY  Chief Complaint Epistaxis   HPI Marc Aguilar is a 57 y.o. male without significant past medical history the presents the emergency department today for nosebleed.  Patient states that after he left work last night he had bleeding of his ear that got better with a Kleenex that he had put in there.  Went home he slept throughout the night without any problems when It started around this morning the nosebleed recurred.  He is able to have a knot at some prior to my evaluation but states that it took a while.  He also had episode of feeling lightheaded and dizzy while his nose is bleeding.  Is asymptomatic with that now.  No trauma to his nose.  Does state that it felt really try yesterday prior to a bleeding.  No history of nosebleeds. No other associated or modifying symptoms.    Past Medical History:  Diagnosis Date  . Arthritis    "knees and hips" (02/28/2018)  . Chicken pox   . GERD (gastroesophageal reflux disease)   . Heart murmur   . Hernia, abdominal   . High cholesterol   . History of gout   . Migraine    "probably monthly" (02/28/2018)  . OSA on CPAP   . Pneumonia    "once" (02/28/2018)  . Refusal of blood transfusions as patient is Jehovah's Witness   . Seasonal allergies   . Type II diabetes mellitus [facility])     Patient Active Problem List   Diagnosis Date Noted  . Chest pain with moderate risk for cardiac etiology 02/28/2018  . Benign prostatic hyperplasia with urinary frequency 10/26/2016  . HLD (hyperlipidemia) 10/02/2015  . OSA (obstructive sleep apnea) 03/13/2015  . Seasonal allergies 03/13/2015  . GERD (gastroesophageal reflux disease) 03/13/2015  . DM type 2 (diabetes mellitus, type 2) ([place]) 10/30/2013    Past Surgical History:  Procedure Laterality Date  . CARDIAC CATHETERIZATION  02/28/2018  . LEFT HEART CATH AND CORONARY ANGIOGRAPHY N/A  02/28/2018   Procedure: LEFT HEART CATH AND CORONARY ANGIOGRAPHY;  Surgeon: Sherren Mocha, MD;  Location: [facility];  Service: Cardiovascular;  Laterality: N/A;  . WRIST GANGLION EXCISION Left 1984    Current Outpatient Rx  . Order #: [id number] Class: Normal  . Order #: [id number] Class: Normal  . Order #: [id number] Class: Normal  . Order #: [id number] Class: Historical Med  . Order #: [id number] Class: Normal  . Order #: [id number] Class: Historical Med  . Order #: [id number] Class: Historical Med  . Order #: [id number] Class: Normal  . Order #: [id number] Class: Historical Med  . Order #: [id number] Class: Historical Med  . Order #: [id number] Class: Historical Med  . Order #: [id number] Class: Normal    Allergies Tape  Family History  Problem Relation Age of Onset  . Diabetes Mother   . Hyperlipidemia Mother   . Bladder Cancer Father   . Liver cancer Sister   . Diabetes Sister   . Diabetes Maternal Aunt   . Arthritis Maternal Grandmother   . Diabetes Maternal Grandmother   . Arthritis Maternal Grandfather   . Arthritis Paternal Grandmother   . Diabetes Brother   . Heart disease Brother   . Colon cancer Neg Hx     Social History Social History   Tobacco Use  . Smoking status: Former Smoker    Packs/day: 1.00    Years: 27.00    Pack  years: 27.00    Types: Cigarettes    Last attempt to quit: 10/18/2009    Years since quitting: 8.5  . Smokeless tobacco: Former Systems developer    Types: Chew    Quit date: 10/18/1994  Substance Use Topics  . Alcohol use: Yes    Alcohol/week: 1.2 oz    Types: 2 Cans of beer per week  . Drug use: No    Review of Systems  All other systems negative except as documented in the HPI. All pertinent positives and negatives as reviewed in the HPI. ____________________________________________   PHYSICAL EXAM:  VITAL SIGNS: ED Triage Vitals  Enc Vitals Group     BP 04/17/18 1816 (!) 153/92     Pulse Rate 04/17/18 1816 98     Resp 04/17/18 1816 20      Temp 04/17/18 1816 99.5 F (37.5 C)     Temp Source 04/17/18 1816 Oral     SpO2 04/17/18 1816 96 %    Constitutional: Alert and oriented. Well appearing and in no acute distress. Eyes: Conjunctivae are normal. PERRL. EOMI. Head: Atraumatic. Nose: No congestion/rhinnorhea. Dried and red blood in right nare. No obvious source of bleeding. Mouth/Throat: Mucous membranes are moist.  Oropharynx non-erythematous. Draining blood in back of throat. Neck: No stridor.  No meningeal signs.   Cardiovascular: Normal rate, regular rhythm. Good peripheral circulation. Grossly normal heart sounds.   Respiratory: Normal respiratory effort.  No retractions. Lungs CTAB. Gastrointestinal: Soft and nontender. No distention.  Musculoskeletal: No lower extremity tenderness nor edema. No gross deformities of extremities. Neurologic:  Normal speech and language. No gross focal neurologic deficits are appreciated.  Skin:  Skin is warm, dry and intact. No rash noted.   ____________________________________________   LABS (all labs ordered are listed, but only abnormal results are displayed)  Labs Reviewed  I-STAT CHEM 8, ED - Abnormal; Notable for the following components:      Result Value   Glucose, Bld 101 (*)    All other components within normal limits   ____________________________________________    INITIAL IMPRESSION / ASSESSMENT AND PLAN / ED COURSE  Improved with afrin and pressure. Will observe.   Observed for a couple hours and hemostatic.   Will dc w/ Afrin.   Pertinent labs & imaging results that were available during my care of the patient were reviewed by me and considered in my medical decision making (see chart for details).  ____________________________________________  FINAL CLINICAL IMPRESSION(S) / ED DIAGNOSES  Final diagnoses:  Epistaxis     MEDICATIONS GIVEN DURING THIS VISIT:  Medications  oxymetazoline (AFRIN) 0.05 % nasal spray 1 spray (1 spray Each Nare  Given 04/17/18 2133)     NEW OUTPATIENT MEDICATIONS STARTED DURING THIS VISIT:  Discharge Medication List as of 04/17/2018  9:30 PM      Note:  This note was prepared with assistance of Dragon voice recognition software. Occasional wrong-word or sound-a-like substitutions may have occurred due to the inherent limitations of voice recognition software.   Merrily Pew, MD 04/17/18 [phone]

## 2018-05-15 ENCOUNTER — Other Ambulatory Visit: Payer: Self-pay | Admitting: Cardiology

## 2018-05-15 MED FILL — metFORMIN HCL 1000 MG TABS: 1000 | 90 days supply | Qty: 180 | Fill #2

## 2018-05-15 MED FILL — TAMSULOSIN HCL 0.4 MG CAP: 0.4 | 90 days supply | Qty: 90 | Fill #2

## 2018-05-15 NOTE — Telephone Encounter (Signed)
This is Dr. Jordan's pt. °

## 2018-05-16 MED FILL — AMLODIPINE BESYLATE 5 MG TA: 5 | 90 days supply | Qty: 90 | Fill #0

## 2018-06-19 ENCOUNTER — Encounter: Payer: Self-pay | Admitting: Internal Medicine

## 2018-06-22 ENCOUNTER — Ambulatory Visit: Payer: 59 | Admitting: Internal Medicine

## 2018-06-22 DIAGNOSIS — Z0289 Encounter for other administrative examinations: Secondary | ICD-10-CM

## 2018-06-22 NOTE — Progress Notes (Deleted)
Subjective:    Patient ID: Marc Aguilar, male    DOB: 1961-07-21, 57 y.o.   MRN: [id number]  HPI  Pt presents to the clinic today with c/o right shoulder pain.   He also has a spot on his back.  Review of Systems  Past Medical History:  Diagnosis Date  . Arthritis    "knees and hips" (02/28/2018)  . Chicken pox   . GERD (gastroesophageal reflux disease)   . Heart murmur   . Hernia, abdominal   . High cholesterol   . History of gout   . Migraine    "probably monthly" (02/28/2018)  . OSA on CPAP   . Pneumonia    "once" (02/28/2018)  . Refusal of blood transfusions as patient is Jehovah's Witness   . Seasonal allergies   . Type II diabetes mellitus ([place])     Current Outpatient Medications  Medication Sig Dispense Refill  . ACCU-CHEK FASTCLIX LANCETS MISC USE TWICE A DAY AS DIRECTED 204 each 3  . Alcohol Swabs (ALCOHOL PREP) PADS 1 each by Does not apply route 2 (two) times daily. 200 each 3  . amLODipine (NORVASC) 5 MG tablet TAKE 1 TABLET BY MOUTH DAILY. 30 tablet 9  . aspirin EC 81 MG tablet Take 81 mg by mouth daily.    Marland Kitchen glucose blood (ACCU-CHEK GUIDE) test strip 1 each by Other route 2 (two) times daily. Use as instructed 200 each 3  . hydroxypropyl methylcellulose / hypromellose (ISOPTO TEARS / GONIOVISC) 2.5 % ophthalmic solution Place 1 drop into both eyes as needed for dry eyes.    Marland Kitchen latanoprost (XALATAN) 0.005 % ophthalmic solution Place 1 drop into both eyes at bedtime.   6  . metFORMIN (GLUCOPHAGE) 1000 MG tablet TAKE 1 TABLET BY MOUTH 2 TIMES DAILY WITH A MEAL 180 tablet 3  . Multiple Vitamins-Minerals (MULTIVITAMIN WITH MINERALS) tablet Take 1 tablet by mouth daily.    . Nutritional Supplements (PROTEIN SUPPLEMENT 80% PO) Take 8 oz by mouth daily.    Marland Kitchen omega-3 acid ethyl esters (LOVAZA) 1 g capsule Take 1 g by mouth daily.    . tamsulosin (FLOMAX) 0.4 MG CAPS capsule Take 1 capsule (0.4 mg total) by mouth daily. 90 capsule 3   No current  facility-administered medications for this visit.     Allergies  Allergen Reactions  . Tape Rash    Paper tape-blisters    Family History  Problem Relation Age of Onset  . Diabetes Mother   . Hyperlipidemia Mother   . Bladder Cancer Father   . Liver cancer Sister   . Diabetes Sister   . Diabetes Maternal Aunt   . Arthritis Maternal Grandmother   . Diabetes Maternal Grandmother   . Arthritis Maternal Grandfather   . Arthritis Paternal Grandmother   . Diabetes Brother   . Heart disease Brother   . Colon cancer Neg Hx     Social History   Socioeconomic History  . Marital status: Married    Spouse name: Not on file  . Number of children: 2  . Years of education: Not on file  . Highest education level: Not on file  Occupational History  . 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 Financial resource strain: Not on file  . Food insecurity:    Worry: Not on file    Inability: Not on file  . Transportation needs:    Medical: Not on file    Non-medical: Not on file  Tobacco Use  . Smoking status: Former Smoker    Packs/day: 1.00    Years: 27.00    Pack years: 27.00    Types: Cigarettes    Last attempt to quit: 10/18/2009    Years since quitting: 8.6  . Smokeless tobacco: Former Systems developer    Types: [place] date: 10/18/1994  Substance and Sexual Activity  . Alcohol use: Yes    Alcohol/week: 2.0 standard drinks    Types: 2 Cans of beer per week  . Drug use: No  . Sexual activity: Not Currently  Lifestyle  . Physical activity:    Days per week: Not on file    Minutes per session: Not on file  . Stress: Not on file  Relationships  . Social connections:    Talks on phone: Not on file    Gets together: Not on file    Attends religious service: Not on file    Active member of club or organization: Not on file    Attends meetings of clubs or organizations: Not on file    Relationship status: Not on file  . Intimate partner violence:    Fear of current or ex partner:  Not on file    Emotionally abused: Not on file    Physically abused: Not on file    Forced sexual activity: Not on file  Other Topics Concern  . Not on file  Social History Narrative  . Not on file     Constitutional: Denies fever, malaise, fatigue, headache or abrupt weight changes.  HEENT: Denies eye pain, eye redness, ear pain, ringing in the ears, wax buildup, runny nose, nasal congestion, bloody nose, or sore throat. Respiratory: Denies difficulty breathing, shortness of breath, cough or sputum production.   Cardiovascular: Denies chest pain, chest tightness, palpitations or swelling in the hands or feet.  Gastrointestinal: Denies abdominal pain, bloating, constipation, diarrhea or blood in the stool.  GU: Denies urgency, frequency, pain with urination, burning sensation, blood in urine, odor or discharge. Musculoskeletal: Denies decrease in range of motion, difficulty with gait, muscle pain or joint pain and swelling.  Skin: Denies redness, rashes, lesions or ulcercations.  Neurological: Denies dizziness, difficulty with memory, difficulty with speech or problems with balance and coordination.  Psych: Denies anxiety, depression, SI/HI.  No other specific complaints in a complete review of systems (except as listed in HPI above).     Objective:   Physical Exam        Assessment & Plan:

## 2018-06-27 ENCOUNTER — Ambulatory Visit (INDEPENDENT_AMBULATORY_CARE_PROVIDER_SITE_OTHER): Payer: 59 | Admitting: Internal Medicine

## 2018-06-27 ENCOUNTER — Encounter: Payer: Self-pay | Admitting: Internal Medicine

## 2018-06-27 VITALS — BP 126/82 | HR 82 | Temp 97.8°F | Wt 245.0 lb

## 2018-06-27 DIAGNOSIS — M25511 Pain in right shoulder: Secondary | ICD-10-CM | POA: Diagnosis not present

## 2018-06-27 DIAGNOSIS — F4323 Adjustment disorder with mixed anxiety and depressed mood: Secondary | ICD-10-CM

## 2018-06-27 MED ORDER — PREDNISONE 10 MG PO TABS: ORAL_TABLET | ORAL | 0 refills | Status: DC

## 2018-06-27 MED ORDER — METHOCARBAMOL 500 MG PO TABS: 500.0000 mg | ORAL_TABLET | Freq: Every evening | ORAL | 0 refills | Status: DC | PRN

## 2018-06-27 MED ORDER — CITALOPRAM HYDROBROMIDE 10 MG PO TABS: 10.0000 mg | ORAL_TABLET | Freq: Every day | ORAL | 2 refills | Status: DC

## 2018-06-27 MED FILL — predniSONE 10 MG TABS: 10 | 9 days supply | Qty: 18 | Fill #0

## 2018-06-27 MED FILL — CITALOPRAM HBR 10 MG TABLET: 10 | 30 days supply | Qty: 30 | Fill #0

## 2018-06-27 MED FILL — METHOCARBAMOL 500 MG TABS: 500 | 20 days supply | Qty: 20 | Fill #0

## 2018-06-27 NOTE — Patient Instructions (Signed)
Shoulder Exercises Ask your health care provider which exercises are safe for you. Do exercises exactly as told by your health care provider and adjust them as directed. It is normal to feel mild stretching, pulling, tightness, or discomfort as you do these exercises, but you should stop right away if you feel sudden pain or your pain gets worse.Do not begin these exercises until told by your health care provider. RANGE OF MOTION EXERCISES These exercises warm up your muscles and joints and improve the movement and flexibility of your shoulder. These exercises also help to relieve pain, numbness, and tingling. These exercises involve stretching your injured shoulder directly. Exercise A: Pendulum  1. Stand near a wall or a surface that you can hold onto for balance. 2. Bend at the waist and let your left / right arm hang straight down. Use your other arm to support you. Keep your back straight and do not lock your knees. 3. Relax your left / right arm and shoulder muscles, and move your hips and your trunk so your left / right arm swings freely. Your arm should swing because of the motion of your body, not because you are using your arm or shoulder muscles. 4. Keep moving your body so your arm swings in the following directions, as told by your health care provider: ? Side to side. ? Forward and backward. ? In clockwise and counterclockwise circles. 5. Continue each motion for __________ seconds, or for as long as told by your health care provider. 6. Slowly return to the starting position. Repeat __________ times. Complete this exercise __________ times a day. Exercise B:Flexion, Standing  1. Stand and hold a broomstick, a [place], or a similar object. Place your hands a little more than shoulder-width apart on the object. Your left / right hand should be palm-up, and your other hand should be palm-down. 2. Keep your elbow straight and keep your shoulder muscles relaxed. Push the stick down with  your healthy arm to raise your left / right arm in front of your body, and then over your head until you feel a stretch in your shoulder. ? Avoid shrugging your shoulder while you raise your arm. Keep your shoulder blade tucked down toward the middle of your back. 3. Hold for __________ seconds. 4. Slowly return to the starting position. Repeat __________ times. Complete this exercise __________ times a day. Exercise C: Abduction, Standing 1. Stand and hold a broomstick, a [place], or a similar object. Place your hands a little more than shoulder-width apart on the object. Your left / right hand should be palm-up, and your other hand should be palm-down. 2. While keeping your elbow straight and your shoulder muscles relaxed, push the stick across your body toward your left / right side. Raise your left / right arm to the side of your body and then over your head until you feel a stretch in your shoulder. ? Do not raise your arm above shoulder height, unless your health care provider tells you to do that. ? Avoid shrugging your shoulder while you raise your arm. Keep your shoulder blade tucked down toward the middle of your back. 3. Hold for __________ seconds. 4. Slowly return to the starting position. Repeat __________ times. Complete this exercise __________ times a day. Exercise D:Internal Rotation  1. Place your left / right hand behind your back, palm-up. 2. Use your other hand to dangle an exercise band, a towel, or a similar object over your shoulder. Grasp the band with   your left / right hand so you are holding onto both ends. 3. Gently pull up on the band until you feel a stretch in the front of your left / right shoulder. ? Avoid shrugging your shoulder while you raise your arm. Keep your shoulder blade tucked down toward the middle of your back. 4. Hold for __________ seconds. 5. Release the stretch by letting go of the band and lowering your hands. Repeat __________ times. Complete  this exercise __________ times a day. STRETCHING EXERCISES These exercises warm up your muscles and joints and improve the movement and flexibility of your shoulder. These exercises also help to relieve pain, numbness, and tingling. These exercises are done using your healthy shoulder to help stretch the muscles of your injured shoulder. Exercise E: Corner Stretch (External Rotation and Abduction)  1. Stand in a doorway with one of your feet slightly in front of the other. This is called a staggered stance. If you cannot reach your forearms to the door frame, stand facing a corner of a room. 2. Choose one of the following positions as told by your health care provider: ? Place your hands and forearms on the door frame above your head. ? Place your hands and forearms on the door frame at the height of your head. ? Place your hands on the door frame at the height of your elbows. 3. Slowly move your weight onto your front foot until you feel a stretch across your chest and in the front of your shoulders. Keep your head and chest upright and keep your abdominal muscles tight. 4. Hold for __________ seconds. 5. To release the stretch, shift your weight to your back foot. Repeat __________ times. Complete this stretch __________ times a day. Exercise F:Extension, Standing 1. Stand and hold a broomstick, a [place], or a similar object behind your back. ? Your hands should be a little wider than shoulder-width apart. ? Your palms should face away from your back. 2. Keeping your elbows straight and keeping your shoulder muscles relaxed, move the stick away from your body until you feel a stretch in your shoulder. ? Avoid shrugging your shoulders while you move the stick. Keep your shoulder blade tucked down toward the middle of your back. 3. Hold for __________ seconds. 4. Slowly return to the starting position. Repeat __________ times. Complete this exercise __________ times a day. STRENGTHENING  EXERCISES These exercises build strength and endurance in your shoulder. Endurance is the ability to use your muscles for a long time, even after they get tired. Exercise G:External Rotation  1. Sit in a stable chair without armrests. 2. Secure an exercise band at elbow height on your left / right side. 3. Place a soft object, such as a folded towel or a small pillow, between your left / right upper arm and your body to move your elbow a few inches away (about 10 cm) from your side. 4. Hold the end of the band so it is tight and there is no slack. 5. Keeping your elbow pressed against the soft object, move your left / right forearm out, away from your abdomen. Keep your body steady so only your forearm moves. 6. Hold for __________ seconds. 7. Slowly return to the starting position. Repeat __________ times. Complete this exercise __________ times a day. Exercise H:Shoulder Abduction  1. Sit in a stable chair without armrests, or stand. 2. Hold a __________ weight in your left / right hand, or hold an exercise band with both hands.   3. Start with your arms straight down and your left / right palm facing in, toward your body. 4. Slowly lift your left / right hand out to your side. Do not lift your hand above shoulder height unless your health care provider tells you that this is safe. ? Keep your arms straight. ? Avoid shrugging your shoulder while you do this movement. Keep your shoulder blade tucked down toward the middle of your back. 5. Hold for __________ seconds. 6. Slowly lower your arm, and return to the starting position. Repeat __________ times. Complete this exercise __________ times a day. Exercise I:Shoulder Extension 1. Sit in a stable chair without armrests, or stand. 2. Secure an exercise band to a stable object in front of you where it is at shoulder height. 3. Hold one end of the exercise band in each hand. Your palms should face each other. 4. Straighten your elbows and  lift your hands up to shoulder height. 5. Step back, away from the secured end of the exercise band, until the band is tight and there is no slack. 6. Squeeze your shoulder blades together as you pull your hands down to the sides of your thighs. Stop when your hands are straight down by your sides. Do not let your hands go behind your body. 7. Hold for __________ seconds. 8. Slowly return to the starting position. Repeat __________ times. Complete this exercise __________ times a day. Exercise J:Standing Shoulder Row 1. Sit in a stable chair without armrests, or stand. 2. Secure an exercise band to a stable object in front of you so it is at waist height. 3. Hold one end of the exercise band in each hand. Your palms should be in a thumbs-up position. 4. Bend each of your elbows to an "L" shape (about 90 degrees) and keep your upper arms at your sides. 5. Step back until the band is tight and there is no slack. 6. Slowly pull your elbows back behind you. 7. Hold for __________ seconds. 8. Slowly return to the starting position. Repeat __________ times. Complete this exercise __________ times a day. Exercise K:Shoulder Press-Ups  1. Sit in a stable chair that has armrests. Sit upright, with your feet flat on the floor. 2. Put your hands on the armrests so your elbows are bent and your fingers are pointing forward. Your hands should be about even with the sides of your body. 3. Push down on the armrests and use your arms to lift yourself off of the chair. Straighten your elbows and lift yourself up as much as you comfortably can. ? Move your shoulder blades down, and avoid letting your shoulders move up toward your ears. ? Keep your feet on the ground. As you get stronger, your feet should support less of your body weight as you lift yourself up. 4. Hold for __________ seconds. 5. Slowly lower yourself back into the chair. Repeat __________ times. Complete this exercise __________ times a  day. Exercise L: Wall Push-Ups  1. Stand so you are facing a stable wall. Your feet should be about one arm-length away from the wall. 2. Lean forward and place your palms on the wall at shoulder height. 3. Keep your feet flat on the floor as you bend your elbows and lean forward toward the wall. 4. Hold for __________ seconds. 5. Straighten your elbows to push yourself back to the starting position. Repeat __________ times. Complete this exercise __________ times a day. This information is not intended to replace advice   given to you by your health care provider. Make sure you discuss any questions you have with your health care provider. Document Released: 08/18/2005 Document Revised: 06/28/2016 Document Reviewed: 06/15/2015 [employer] Interactive Patient Education  2018 [employer]  

## 2018-06-27 NOTE — Progress Notes (Signed)
Subjective:    Patient ID: Marc Aguilar, male    DOB: 1961/04/07, 57 y.o.   MRN: [id number]  HPI  Pt presents to the clinic today with c/o right shoulder pain. This started 1 month ago. He reports he was moving furniture during that time, but does not specifically recall any injury to the area. He describes the pain as sharp and stabbing. The pain radiates down his arm and up into his neck. He reports his right hand feels tight, but he denies numbness or tingling in the arm. He has tried Tylenol, Ibuprofen and heat without any relief.   He also c/o depression. His wife has been having multiple health issues recently. He has been missing a lot of work to help take care of her. All of this is weighing heavily on him. He has never had issues with anxiety and depression in the past. He denies SI/HI.  Review of Systems      Past Medical History:  Diagnosis Date  . Arthritis    "knees and hips" (02/28/2018)  . Chicken pox   . GERD (gastroesophageal reflux disease)   . Heart murmur   . Hernia, abdominal   . High cholesterol   . History of gout   . Migraine    "probably monthly" (02/28/2018)  . OSA on CPAP   . Pneumonia    "once" (02/28/2018)  . Refusal of blood transfusions as patient is Jehovah's Witness   . Seasonal allergies   . Type II diabetes mellitus ([place])     Current Outpatient Medications  Medication Sig Dispense Refill  . ACCU-CHEK FASTCLIX LANCETS MISC USE TWICE A DAY AS DIRECTED 204 each 3  . Alcohol Swabs (ALCOHOL PREP) PADS 1 each by Does not apply route 2 (two) times daily. 200 each 3  . amLODipine (NORVASC) 5 MG tablet TAKE 1 TABLET BY MOUTH DAILY. 30 tablet 9  . aspirin EC 81 MG tablet Take 81 mg by mouth daily.    Marland Kitchen glucose blood (ACCU-CHEK GUIDE) test strip 1 each by Other route 2 (two) times daily. Use as instructed 200 each 3  . hydroxypropyl methylcellulose / hypromellose (ISOPTO TEARS / GONIOVISC) 2.5 % ophthalmic solution Place 1 drop into both eyes as  needed for dry eyes.    Marland Kitchen latanoprost (XALATAN) 0.005 % ophthalmic solution Place 1 drop into both eyes at bedtime.   6  . metFORMIN (GLUCOPHAGE) 1000 MG tablet TAKE 1 TABLET BY MOUTH 2 TIMES DAILY WITH A MEAL 180 tablet 3  . Multiple Vitamins-Minerals (MULTIVITAMIN WITH MINERALS) tablet Take 1 tablet by mouth daily.    . Nutritional Supplements (PROTEIN SUPPLEMENT 80% PO) Take 8 oz by mouth daily.    Marland Kitchen omega-3 acid ethyl esters (LOVAZA) 1 g capsule Take 1 g by mouth daily.    . tamsulosin (FLOMAX) 0.4 MG CAPS capsule Take 1 capsule (0.4 mg total) by mouth daily. 90 capsule 3   No current facility-administered medications for this visit.     Allergies  Allergen Reactions  . Tape Rash    Paper tape-blisters    Family History  Problem Relation Age of Onset  . Diabetes Mother   . Hyperlipidemia Mother   . Bladder Cancer Father   . Liver cancer Sister   . Diabetes Sister   . Diabetes Maternal Aunt   . Arthritis Maternal Grandmother   . Diabetes Maternal Grandmother   . Arthritis Maternal Grandfather   . Arthritis Paternal Grandmother   . Diabetes Brother   .  Heart disease Brother   . Colon cancer Neg Hx     Social History   Socioeconomic History  . Marital status: Married    Spouse name: Not on file  . Number of children: 2  . Years of education: Not on file  . Highest education level: Not on file  Occupational History  . 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 Financial resource strain: Not on file  . Food insecurity:    Worry: Not on file    Inability: Not on file  . Transportation needs:    Medical: Not on file    Non-medical: Not on file  Tobacco Use  . Smoking status: Former Smoker    Packs/day: 1.00    Years: 27.00    Pack years: 27.00    Types: Cigarettes    Last attempt to quit: 10/18/2009    Years since quitting: 8.6  . Smokeless tobacco: Former Systems developer    Types: [place] date: 10/18/1994  Substance and Sexual Activity  . Alcohol use: Yes     Alcohol/week: 2.0 standard drinks    Types: 2 Cans of beer per week  . Drug use: No  . Sexual activity: Not Currently  Lifestyle  . Physical activity:    Days per week: Not on file    Minutes per session: Not on file  . Stress: Not on file  Relationships  . Social connections:    Talks on phone: Not on file    Gets together: Not on file    Attends religious service: Not on file    Active member of club or organization: Not on file    Attends meetings of clubs or organizations: Not on file    Relationship status: Not on file  . Intimate partner violence:    Fear of current or ex partner: Not on file    Emotionally abused: Not on file    Physically abused: Not on file    Forced sexual activity: Not on file  Other Topics Concern  . Not on file  Social History Narrative  . Not on file     Constitutional: Denies fever, malaise, fatigue, headache or abrupt weight changes.  Musculoskeletal: Pt reports right shoulder pain, weakness. Denies difficulty with gait, muscle pain or joint swelling.  Neurological: Denies numbness or tingling of RUE.  Psych: Pt reports anxiety and depression. Denies SI/HI.  No other specific complaints in a complete review of systems (except as listed in HPI above).  Objective:   Physical Exam BP 126/82   Pulse 82   Temp 97.8 F (36.6 C) (Oral)   Wt 245 lb (111.1 kg)   SpO2 98%   BMI 33.23 kg/m  Wt Readings from Last 3 Encounters:  06/27/18 245 lb (111.1 kg)  03/22/18 244 lb (110.7 kg)  02/28/18 240 lb (108.9 kg)    General: Appears his stated age, obese in NAD. Cardiovascular: Radial pulse 2+ bilaterally. Musculoskeletal: Decreased external rotation of the right shoulder. Normal internal rotation. Pain with palpation over the right anterior proximal biceps tendon. Strength 5/5 BUE. Negative drop can test. Neurological: Alert and oriented. Sensation intact to BUE. Psychiatric: Mood and affect mildly flat. Behavior is normal. Judgment and  thought content normal.     BMET    Component Value Date/Time   NA 141 04/17/2018 1915   K 3.9 04/17/2018 1915   CL 105 04/17/2018 1915   CO2 27 02/28/2018 0949   GLUCOSE 101 (H) 04/17/2018  1915   BUN 20 04/17/2018 1915   CREATININE 0.90 04/17/2018 1915   CALCIUM 9.5 02/28/2018 0949   GFRNONAA >60 02/28/2018 1723   GFRAA >60 02/28/2018 1723    Lipid Panel     Component Value Date/Time   CHOL 167 11/10/2017 0840   TRIG 59.0 11/10/2017 0840   HDL 44.20 11/10/2017 0840   CHOLHDL 4 11/10/2017 0840   VLDL 11.8 11/10/2017 0840   LDLCALC 111 (H) 11/10/2017 0840    CBC    Component Value Date/Time   WBC 4.3 02/28/2018 1723   RBC 5.63 02/28/2018 1723   HGB 15.3 04/17/2018 1915   HCT 45.0 04/17/2018 1915   PLT 232 02/28/2018 1723   MCV 78.3 02/28/2018 1723   MCH 25.6 (L) 02/28/2018 1723   MCHC 32.7 02/28/2018 1723   RDW 14.7 02/28/2018 1723   LYMPHSABS 3.0 02/28/2018 0300   MONOABS 0.4 02/28/2018 0300   EOSABS 0.2 02/28/2018 0300   BASOSABS 0.0 02/28/2018 0300    Hgb A1C Lab Results  Component Value Date   HGBA1C 6.8 (H) 11/10/2017             Assessment & Plan:   Right Shoulder Pain:  Likely biceps tendonitis eRx for Pred Taper x 9 days- monitor sugars, avoid OTC NSAID's, can take Tylenol if needed eRx for Methocarbamol 500 mg QHS prn- sedation caution given Shoulder exercises given Consider xray if worse  Adjustment Reaction with Anxiety and Depression:  Situational Support offered today Will trial Celexa  Update me in 4-6 weeks and let me know how your mood is, RTC as needed or if shoulder pain persist or worsens Webb Silversmith, NP

## 2018-07-17 ENCOUNTER — Encounter: Payer: Self-pay | Admitting: Internal Medicine

## 2018-07-17 DIAGNOSIS — R1084 Generalized abdominal pain: Secondary | ICD-10-CM | POA: Diagnosis not present

## 2018-07-17 DIAGNOSIS — R197 Diarrhea, unspecified: Secondary | ICD-10-CM | POA: Diagnosis not present

## 2018-07-31 ENCOUNTER — Other Ambulatory Visit: Payer: Self-pay | Admitting: Internal Medicine

## 2018-07-31 MED FILL — metFORMIN HCL 1000 MG TABS: 1000 | 90 days supply | Qty: 180 | Fill #3

## 2018-07-31 MED FILL — AMLODIPINE BESYLATE 5 MG TA: 5 | 90 days supply | Qty: 90 | Fill #1

## 2018-07-31 MED FILL — CITALOPRAM HBR 10 MG TABLET: 10 | 30 days supply | Qty: 30 | Fill #1

## 2018-07-31 MED FILL — TAMSULOSIN HCL 0.4 MG CAP: 0.4 | 90 days supply | Qty: 90 | Fill #3

## 2018-07-31 NOTE — Telephone Encounter (Signed)
Last filled 06/27/2018... Please advise

## 2018-07-31 NOTE — Telephone Encounter (Signed)
Refuse. This was for an acute issue. If not improving, he needs to let me know.

## 2018-09-01 MED FILL — CITALOPRAM HBR 10 MG TABLET: 10 | 30 days supply | Qty: 30 | Fill #2

## 2018-09-01 MED FILL — ACCU-CHEK FASTCLIX LANCETS: 90 days supply | Qty: 204 | Fill #1

## 2018-09-01 MED FILL — ACCU-CHEK GUIDE TEST STRIP: 90 days supply | Qty: 200 | Fill #2

## 2018-09-04 ENCOUNTER — Encounter: Payer: Self-pay | Admitting: Internal Medicine

## 2018-09-04 MED ORDER — CITALOPRAM HYDROBROMIDE 20 MG PO TABS: 20.0000 mg | ORAL_TABLET | Freq: Every day | ORAL | 2 refills | Status: DC

## 2018-09-04 MED FILL — CITALOPRAM HBR 20 MG TABLET: 20 | 30 days supply | Qty: 30 | Fill #0

## 2018-10-19 MED FILL — CITALOPRAM HBR 20 MG TABLET: 20 | 30 days supply | Qty: 30 | Fill #1

## 2018-11-14 ENCOUNTER — Encounter: Payer: Self-pay | Admitting: Internal Medicine

## 2018-11-14 ENCOUNTER — Ambulatory Visit (INDEPENDENT_AMBULATORY_CARE_PROVIDER_SITE_OTHER)
Admission: RE | Admit: 2018-11-14 | Discharge: 2018-11-14 | Disposition: A | Discharge status: Home / Self Care | Payer: No Typology Code available for payment source | Source: Ambulatory Visit | Attending: Internal Medicine | Admitting: Internal Medicine

## 2018-11-14 ENCOUNTER — Other Ambulatory Visit: Payer: Self-pay

## 2018-11-14 ENCOUNTER — Ambulatory Visit (INDEPENDENT_AMBULATORY_CARE_PROVIDER_SITE_OTHER): Payer: No Typology Code available for payment source | Admitting: Internal Medicine

## 2018-11-14 VITALS — BP 124/82 | HR 68 | Temp 97.9°F | Ht 70.0 in | Wt 241.0 lb

## 2018-11-14 DIAGNOSIS — R200 Anesthesia of skin: Secondary | ICD-10-CM | POA: Diagnosis not present

## 2018-11-14 DIAGNOSIS — G43909 Migraine, unspecified, not intractable, without status migrainosus: Secondary | ICD-10-CM | POA: Insufficient documentation

## 2018-11-14 DIAGNOSIS — M542 Cervicalgia: Secondary | ICD-10-CM

## 2018-11-14 DIAGNOSIS — R202 Paresthesia of skin: Secondary | ICD-10-CM | POA: Diagnosis not present

## 2018-11-14 DIAGNOSIS — M150 Primary generalized (osteo)arthritis: Secondary | ICD-10-CM

## 2018-11-14 DIAGNOSIS — I10 Essential (primary) hypertension: Secondary | ICD-10-CM

## 2018-11-14 DIAGNOSIS — E7800 Pure hypercholesterolemia, unspecified: Secondary | ICD-10-CM | POA: Diagnosis not present

## 2018-11-14 DIAGNOSIS — F411 Generalized anxiety disorder: Secondary | ICD-10-CM | POA: Insufficient documentation

## 2018-11-14 DIAGNOSIS — Z0001 Encounter for general adult medical examination with abnormal findings: Secondary | ICD-10-CM | POA: Diagnosis not present

## 2018-11-14 DIAGNOSIS — G43C1 Periodic headache syndromes in child or adult, intractable: Secondary | ICD-10-CM

## 2018-11-14 DIAGNOSIS — G4733 Obstructive sleep apnea (adult) (pediatric): Secondary | ICD-10-CM

## 2018-11-14 DIAGNOSIS — M25511 Pain in right shoulder: Secondary | ICD-10-CM

## 2018-11-14 DIAGNOSIS — F419 Anxiety disorder, unspecified: Secondary | ICD-10-CM

## 2018-11-14 DIAGNOSIS — R350 Frequency of micturition: Secondary | ICD-10-CM

## 2018-11-14 DIAGNOSIS — K219 Gastro-esophageal reflux disease without esophagitis: Secondary | ICD-10-CM

## 2018-11-14 DIAGNOSIS — M159 Polyosteoarthritis, unspecified: Secondary | ICD-10-CM

## 2018-11-14 DIAGNOSIS — N401 Enlarged prostate with lower urinary tract symptoms: Secondary | ICD-10-CM | POA: Diagnosis not present

## 2018-11-14 DIAGNOSIS — E119 Type 2 diabetes mellitus without complications: Secondary | ICD-10-CM

## 2018-11-14 DIAGNOSIS — G8929 Other chronic pain: Secondary | ICD-10-CM

## 2018-11-14 DIAGNOSIS — Z23 Encounter for immunization: Secondary | ICD-10-CM

## 2018-11-14 DIAGNOSIS — M1990 Unspecified osteoarthritis, unspecified site: Secondary | ICD-10-CM | POA: Insufficient documentation

## 2018-11-14 DIAGNOSIS — F32A Depression, unspecified: Secondary | ICD-10-CM

## 2018-11-14 DIAGNOSIS — I152 Hypertension secondary to endocrine disorders: Secondary | ICD-10-CM | POA: Insufficient documentation

## 2018-11-14 DIAGNOSIS — F329 Major depressive disorder, single episode, unspecified: Secondary | ICD-10-CM

## 2018-11-14 LAB — VITAMIN D 25 HYDROXY (VIT D DEFICIENCY, FRACTURES): VITD: 34.21 ng/mL (ref 30.00–100.00)

## 2018-11-14 LAB — MICROALBUMIN / CREATININE URINE RATIO
Creatinine,U: 183 mg/dL
MICROALB/CREAT RATIO: 0.4 mg/g (ref 0.0–30.0)
Microalb, Ur: <0.7 mg/dL (ref 0.0–1.9)

## 2018-11-14 LAB — COMPREHENSIVE METABOLIC PANEL
ALBUMIN: 4.4 g/dL (ref 3.5–5.2)
ALK PHOS: 61 U/L (ref 39–117)
ALT: 19 U/L (ref 0–53)
AST: 32 U/L (ref 0–37)
BILIRUBIN TOTAL: 0.5 mg/dL (ref 0.2–1.2)
BUN: 16 mg/dL (ref 6–23)
CALCIUM: 9.8 mg/dL (ref 8.4–10.5)
CO2: 29 mEq/L (ref 19–32)
Chloride: 101 mEq/L (ref 96–112)
Creatinine, Ser: 1.12 mg/dL (ref 0.40–1.50)
GFR: 82.23 mL/min (ref >60.00)
GLUCOSE: 115 mg/dL — AB (ref 70–99)
POTASSIUM: 4.1 meq/L (ref 3.5–5.1)
Sodium: 138 mEq/L (ref 135–145)
TOTAL PROTEIN: 7.4 g/dL (ref 6.0–8.3)

## 2018-11-14 LAB — VITAMIN B12: Vitamin B-12: 798 pg/mL (ref 211–911)

## 2018-11-14 LAB — LIPID PANEL
CHOLESTEROL: 188 mg/dL (ref 0–200)
HDL: 42.6 mg/dL (ref >39.00)
LDL Cholesterol: 133 mg/dL — ABNORMAL HIGH (ref 0–99)
NONHDL: 145.5
TRIGLYCERIDES: 62 mg/dL (ref 0.0–149.0)
Total CHOL/HDL Ratio: 4
VLDL: 12.4 mg/dL (ref 0.0–40.0)

## 2018-11-14 LAB — CBC
HEMATOCRIT: 43.4 % (ref 39.0–52.0)
HEMOGLOBIN: 14.2 g/dL (ref 13.0–17.0)
MCHC: 32.6 g/dL (ref 30.0–36.0)
MCV: 79.5 fl (ref 78.0–100.0)
PLATELETS: 222 10*3/uL (ref 150.0–400.0)
RBC: 5.46 Mil/uL (ref 4.22–5.81)
RDW: 15.5 % (ref 11.5–15.5)
WBC: 4.3 10*3/uL (ref 4.0–10.5)

## 2018-11-14 LAB — HEMOGLOBIN A1C
Hgb A1c MFr Bld: 5.4 % of total Hgb (ref <5.7)
Mean Plasma Glucose: 108 (calc)
eAG (mmol/L): 6 (calc)

## 2018-11-14 LAB — TSH: TSH: 1.2 u[IU]/mL (ref 0.35–4.50)

## 2018-11-14 NOTE — Assessment & Plan Note (Signed)
Improved Discussed how weight loss could help reduce joint pain Continue Ibuprofen as needed

## 2018-11-14 NOTE — Assessment & Plan Note (Signed)
Chronic but stable on Citalopram Support offered today

## 2018-11-14 NOTE — Assessment & Plan Note (Signed)
Currently not an issue Discussed how weight loss could help reduce reflux Okay to take Tums OTC as needed

## 2018-11-14 NOTE — Assessment & Plan Note (Signed)
A1c and urine microalbumin today Encouraged him to consume a low carb diet and exercise for weight loss Continue Metformin for now Encouraged him to make his annual eye exam appointment Foot exam today Flu shot up-to-date Pneumovax today

## 2018-11-14 NOTE — Assessment & Plan Note (Signed)
Controlled on Amlodipine Reinforced DASH diet and exercise for weight loss C met today 

## 2018-11-14 NOTE — Patient Instructions (Signed)

## 2018-11-14 NOTE — Assessment & Plan Note (Signed)
Controlled with Excedrin OTC

## 2018-11-14 NOTE — Progress Notes (Signed)
Subjective:    Patient ID: Marc Aguilar, male    DOB: December 25, 1960, 58 y.o.   MRN: [id number]  HPI  Pt presents to the clinic today for his annual exam. He is also due to follow up chronic conditions.  Osteoarthritis: Mainly in his knees and hips. He takes Ibuprofen as needed with good relief.  GERD: Currently not an issue. He is not taking anything OTC for this. There is no upper GI on file.  Migraines: These occur very rarely. He can takes Excedrin as needed with good relief. He does not follow with neurology.   OSA: He averages 7-8 hours of sleep per night with the use of his CPAP.  He does not always feel rested when he wakes up. Sleep study from 03/2014 reviewed.   HLD: His last LDL was 111, 10/2017. He is not taking Lovaza currently because he ran out. He is not on a statin. He tries to eat a low fat diet.  DM 2: His last A1C was 6.8%, 10/2017. His sugars range 117-125. He is taking Metformin as prescribed. He checks his feet daily. His last eye exam was about 1 year ago.   Anxiety and Depression: Triggered by wife's chronic health issues. He is taking Citalopram as prescribed. He denies SI/HI.  HTN: His BP today is 124/82. He is taking Amlodipine as prescribed. ECG from 02/2018 reviewed.  BPH: He c/o urinary frequency. He takes Flomax as needed with good results. He is not following with urology.   He also c/o persistent right shoulder pain. He describes the pain as pulling. The pain radiates into the right side of his neck. He does have some associated neck pain, numbness and tingling in bilateral hands. He denies weakness. He takes Ibuprofen occasionally. He denies any injury to the area.  Flu: 07/2018 Tetanus: 10/2016 Pneumovax: 07/2014 PSA Screening: 10/2017 Colon Screening: 03/2014, 10 years Vision Screening: annually Dentist: biannually  Diet: He does eat meat. He consumes fruits and veggies. He tries to avoid fried foods. He drinks mostly water, diet soda Exercise:  None  Review of Systems  Past Medical History:  Diagnosis Date  . Arthritis    "knees and hips" (02/28/2018)  . Chicken pox   . GERD (gastroesophageal reflux disease)   . Heart murmur   . Hernia, abdominal   . High cholesterol   . History of gout   . Migraine    "probably monthly" (02/28/2018)  . OSA on CPAP   . Pneumonia    "once" (02/28/2018)  . Refusal of blood transfusions as patient is Jehovah's Witness   . Seasonal allergies   . Type II diabetes mellitus ([place])     Current Outpatient Medications  Medication Sig Dispense Refill  . ACCU-CHEK FASTCLIX LANCETS MISC USE TWICE A DAY AS DIRECTED 204 each 3  . Alcohol Swabs (ALCOHOL PREP) PADS 1 each by Does not apply route 2 (two) times daily. 200 each 3  . amLODipine (NORVASC) 5 MG tablet TAKE 1 TABLET BY MOUTH DAILY. 30 tablet 9  . aspirin EC 81 MG tablet Take 81 mg by mouth daily.    . citalopram (CELEXA) 20 MG tablet Take 1 tablet (20 mg total) by mouth daily. 30 tablet 2  . glucose blood (ACCU-CHEK GUIDE) test strip 1 each by Other route 2 (two) times daily. Use as instructed 200 each 3  . hydroxypropyl methylcellulose / hypromellose (ISOPTO TEARS / GONIOVISC) 2.5 % ophthalmic solution Place 1 drop into both eyes as needed  for dry eyes.    Marland Kitchen latanoprost (XALATAN) 0.005 % ophthalmic solution Place 1 drop into both eyes at bedtime.   6  . metFORMIN (GLUCOPHAGE) 1000 MG tablet TAKE 1 TABLET BY MOUTH 2 TIMES DAILY WITH A MEAL 180 tablet 3  . Multiple Vitamins-Minerals (MULTIVITAMIN WITH MINERALS) tablet Take 1 tablet by mouth daily.    . Nutritional Supplements (PROTEIN SUPPLEMENT 80% PO) Take 8 oz by mouth daily.    Marland Kitchen omega-3 acid ethyl esters (LOVAZA) 1 g capsule Take 1 g by mouth daily.    . tamsulosin (FLOMAX) 0.4 MG CAPS capsule Take 1 capsule (0.4 mg total) by mouth daily. 90 capsule 3   No current facility-administered medications for this visit.     Allergies  Allergen Reactions  . Tape Rash    Paper tape-blisters     Family History  Problem Relation Age of Onset  . Diabetes Mother   . Hyperlipidemia Mother   . Bladder Cancer Father   . Liver cancer Sister   . Diabetes Sister   . Diabetes Maternal Aunt   . Arthritis Maternal Grandmother   . Diabetes Maternal Grandmother   . Arthritis Maternal Grandfather   . Arthritis Paternal Grandmother   . Diabetes Brother   . Heart disease Brother   . Colon cancer Neg Hx     Social History   Socioeconomic History  . Marital status: Married    Spouse name: Not on file  . Number of children: 2  . Years of education: Not on file  . Highest education level: Not on file  Occupational History  . 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 Financial resource strain: Not on file  . Food insecurity:    Worry: Not on file    Inability: Not on file  . Transportation needs:    Medical: Not on file    Non-medical: Not on file  Tobacco Use  . Smoking status: Former Smoker    Packs/day: 1.00    Years: 27.00    Pack years: 27.00    Types: Cigarettes    Last attempt to quit: 10/18/2009    Years since quitting: 9.0  . Smokeless tobacco: Former Systems developer    Types: [employer] date: 10/18/1994  Substance and Sexual Activity  . Alcohol use: Yes    Alcohol/week: 2.0 standard drinks    Types: 2 Cans of beer per week  . Drug use: No  . Sexual activity: Not Currently  Lifestyle  . Physical activity:    Days per week: Not on file    Minutes per session: Not on file  . Stress: Not on file  Relationships  . Social connections:    Talks on phone: Not on file    Gets together: Not on file    Attends religious service: Not on file    Active member of club or organization: Not on file    Attends meetings of clubs or organizations: Not on file    Relationship status: Not on file  . Intimate partner violence:    Fear of current or ex partner: Not on file    Emotionally abused: Not on file    Physically abused: Not on file    Forced sexual activity: Not on file   Other Topics Concern  . Not on file  Social History Narrative  . Not on file     Constitutional: Denies fever, malaise, fatigue, headache or abrupt weight changes.  HEENT: Denies  eye pain, eye redness, ear pain, ringing in the ears, wax buildup, runny nose, nasal congestion, bloody nose, or sore throat. Respiratory: Denies difficulty breathing, shortness of breath, cough or sputum production.   Cardiovascular: Denies chest pain, chest tightness, palpitations or swelling in the hands or feet.  Gastrointestinal: Denies abdominal pain, bloating, constipation, diarrhea or blood in the stool.  GU: Pt reports urinary frequency. Denies urgency, pain with urination, burning sensation, blood in urine, odor or discharge. Musculoskeletal: Pt reports neck pain, right shoulder pain. Denies decrease in range of motion, difficulty with gait, or joint swelling.  Skin: Denies redness, rashes, lesions or ulcercations.  Neurological: Pt reports numbness and tingling in BUE. Denies dizziness, difficulty with memory, difficulty with speech or problems with balance and coordination.  Psych: Pt has a history of anxiety or depression. Denies SI/HI.  No other specific complaints in a complete review of systems (except as listed in HPI above).     Objective:   Physical Exam   BP 124/82   Pulse 68   Temp 97.9 F (36.6 C) (Oral)   Ht 5\' 10"  (1.778 m)   Wt 241 lb (109.3 kg)   SpO2 99%   BMI 34.58 kg/m  Wt Readings from Last 3 Encounters:  11/14/18 241 lb (109.3 kg)  06/27/18 245 lb (111.1 kg)  03/22/18 244 lb (110.7 kg)    General: Appears his stated age, obese, in NAD. Skin: Warm, dry and intact. No ulcerations noted. HEENT: Head: normal shape and size; Eyes: sclera white, no icterus, conjunctiva pink, PERRLA and EOMs intact; Ears: Tm's gray and intact, normal light reflex; Throat/Mouth: Teeth present, mucosa pink and moist, no exudate, lesions or ulcerations noted.  Neck:  Neck supple, trachea  midline. No masses, lumps or thyromegaly present.  Cardiovascular: Normal rate and rhythm. S1,S2 noted.  No murmur, rubs or gallops noted. No JVD or BLE edema. No carotid bruits noted. Pulmonary/Chest: Normal effort and positive vesicular breath sounds. No respiratory distress. No wheezes, rales or ronchi noted.  Abdomen: Soft and nontender. Normal bowel sounds. Ventral hernia noted. Liver, spleen and kidneys non palpable. Musculoskeletal: Normal flexion, extension and rotations of the cervical spine. Normal external rotation of the right shoulder. Decreased internal rotation of right shoulder secondary to pain. Positive drop can on the right. Strength 5/5 BUE/BLE. Hand grips equal. No difficulty with gait.  Neurological: Alert and oriented. Positive Tinel's on the right. Negative Phalen's bilaterally. Psychiatric: Mood and affect normal. Behavior is normal. Judgment and thought content normal.     BMET    Component Value Date/Time   NA 141 04/17/2018 1915   K 3.9 04/17/2018 1915   CL 105 04/17/2018 1915   CO2 27 02/28/2018 0949   GLUCOSE 101 (H) 04/17/2018 1915   BUN 20 04/17/2018 1915   CREATININE 0.90 04/17/2018 1915   CALCIUM 9.5 02/28/2018 0949   GFRNONAA >60 02/28/2018 1723   GFRAA >60 02/28/2018 1723    Lipid Panel     Component Value Date/Time   CHOL 167 11/10/2017 0840   TRIG 59.0 11/10/2017 0840   HDL 44.20 11/10/2017 0840   CHOLHDL 4 11/10/2017 0840   VLDL 11.8 11/10/2017 0840   LDLCALC 111 (H) 11/10/2017 0840    CBC    Component Value Date/Time   WBC 4.3 02/28/2018 1723   RBC 5.63 02/28/2018 1723   HGB 15.3 04/17/2018 1915   HCT 45.0 04/17/2018 1915   PLT 232 02/28/2018 1723   MCV 78.3 02/28/2018 1723   MCH  25.6 (L) 02/28/2018 1723   MCHC 32.7 02/28/2018 1723   RDW 14.7 02/28/2018 1723   LYMPHSABS 3.0 02/28/2018 0300   MONOABS 0.4 02/28/2018 0300   EOSABS 0.2 02/28/2018 0300   BASOSABS 0.0 02/28/2018 0300    Hgb A1C Lab Results  Component Value Date    HGBA1C 6.8 (H) 11/10/2017           Assessment & Plan:   Preventative Health Maintenance:  Flu shot UTD Tetanus UTD Pneumovax today Colon screening UTD Encouraged him to consume a balanced diet and exercise regimen Advised him to see an eye doctor and dentist annually Will check CBC, CMET, Lipid, A1C, urine microalbumin today  Acute Neck Pain, Chronic Right Shoulder Pain, Numbness and Tingling in Hands:  DDX include cervical radiculitis, impingement syndrome of the shoulder, Rotator Cuff Tear Will check TSH, B12 and Vit D today Xray cervical spine today Xray right shoulder today Consider PT, pending xray results  Will follow up after labs and xray, return precautions discussed Webb Silversmith, NP

## 2018-11-14 NOTE — Assessment & Plan Note (Signed)
We will continue Flomax Monitor

## 2018-11-14 NOTE — Assessment & Plan Note (Signed)
Encouraged use of CPAP May need to consider follow-up with pulmonology for repeat sleep study Discussed how weight loss could help improve sleep apnea

## 2018-11-14 NOTE — Assessment & Plan Note (Signed)
C met and Lipid profile today Encouraged him to consume a low-fat diet If LDL> 100, will consider statin therapy

## 2018-11-16 ENCOUNTER — Encounter: Payer: Self-pay | Admitting: Internal Medicine

## 2018-11-16 MED ORDER — ATORVASTATIN CALCIUM 10 MG PO TABS: 10.0000 mg | ORAL_TABLET | Freq: Every day | ORAL | 3 refills | Status: DC

## 2018-11-16 MED FILL — ATORVASTATIN 10 MG TABLET: 10 | 30 days supply | Qty: 30 | Fill #0

## 2018-11-17 ENCOUNTER — Other Ambulatory Visit: Payer: Self-pay | Admitting: Internal Medicine

## 2018-11-17 DIAGNOSIS — G8929 Other chronic pain: Secondary | ICD-10-CM

## 2018-11-17 DIAGNOSIS — M25511 Pain in right shoulder: Principal | ICD-10-CM

## 2018-11-20 ENCOUNTER — Other Ambulatory Visit: Payer: Self-pay | Admitting: Internal Medicine

## 2018-11-20 DIAGNOSIS — N401 Enlarged prostate with lower urinary tract symptoms: Secondary | ICD-10-CM

## 2018-11-20 DIAGNOSIS — R350 Frequency of micturition: Principal | ICD-10-CM

## 2018-11-20 MED FILL — CITALOPRAM HBR 20 MG TABLET: 20 | 30 days supply | Qty: 30 | Fill #2

## 2018-11-20 MED FILL — AMLODIPINE BESYLATE 5 MG TA: 5 | 90 days supply | Qty: 90 | Fill #2 | Status: TO

## 2018-11-21 ENCOUNTER — Ambulatory Visit (INDEPENDENT_AMBULATORY_CARE_PROVIDER_SITE_OTHER)
Admission: RE | Admit: 2018-11-21 | Discharge: 2018-11-21 | Disposition: A | Discharge status: Home / Self Care | Payer: No Typology Code available for payment source | Source: Ambulatory Visit | Attending: Internal Medicine | Admitting: Internal Medicine

## 2018-11-21 DIAGNOSIS — M25511 Pain in right shoulder: Secondary | ICD-10-CM | POA: Diagnosis not present

## 2018-11-21 DIAGNOSIS — G8929 Other chronic pain: Secondary | ICD-10-CM | POA: Diagnosis not present

## 2018-11-21 MED FILL — TAMSULOSIN HCL 0.4 MG CAP: 0.4 | 90 days supply | Qty: 90 | Fill #0 | Status: TO

## 2018-11-24 ENCOUNTER — Encounter: Payer: Self-pay | Admitting: Internal Medicine

## 2018-11-27 MED ORDER — PREDNISONE 10 MG PO TABS: ORAL_TABLET | ORAL | 0 refills | Status: DC

## 2018-11-27 MED FILL — predniSONE 10 MG TABS: 10 | 9 days supply | Qty: 18 | Fill #0

## 2018-12-15 ENCOUNTER — Ambulatory Visit (HOSPITAL_COMMUNITY)
Admission: EM | Admit: 2018-12-15 | Discharge: 2018-12-15 | Disposition: A | Discharge status: Home / Self Care | Payer: No Typology Code available for payment source

## 2018-12-15 ENCOUNTER — Encounter (HOSPITAL_COMMUNITY): Payer: Self-pay | Admitting: Emergency Medicine

## 2018-12-15 ENCOUNTER — Other Ambulatory Visit: Payer: Self-pay

## 2018-12-15 DIAGNOSIS — J069 Acute upper respiratory infection, unspecified: Secondary | ICD-10-CM | POA: Diagnosis not present

## 2018-12-15 NOTE — Discharge Instructions (Signed)
Push fluids to ensure adequate hydration and keep secretions thin.  Tylenol and/or ibuprofen as needed for pain or fevers.  Use of nasal saline and/or fluticasone nasal spray to help with nasal secretions.  Rest.  Plain mucinex as an expectorant.  If symptoms worsen or do not improve in the next week to return to be seen or to follow up with your PCP.

## 2018-12-15 NOTE — ED Provider Notes (Signed)
[place]    CSN: [id number] Arrival date & time: 12/15/18  1606     History   Chief Complaint Chief Complaint  Patient presents with  . URI    HPI Marc Aguilar is a 58 y.o. male.   Marc Aguilar presents with complaints of cough, sore throat with swallowing, voice hoarse, headache, nausea without vomiting and fever with tmax 100. Symptoms started yesterday, worse last night. Has improved today. No chest pain  Or shortness of breath . Cough is productive. Pain to both ears. No vomiting or diarrhea. No rash. 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 Got his flu vaccine this season. Hasn't taken any medications for his symptoms. No history of asthma. Doesn't smoke. Hx of gerd, migraines, OSA, dm, htn.    ROS per HPI.      Past Medical History:  Diagnosis Date  . Arthritis    "knees and hips" (02/28/2018)  . Chicken pox   . GERD (gastroesophageal reflux disease)   . Heart murmur   . Hernia, abdominal   . High cholesterol   . History of gout   . Migraine    "probably monthly" (02/28/2018)  . OSA on CPAP   . Pneumonia    "once" (02/28/2018)  . Refusal of blood transfusions as patient is Jehovah's Witness   . Seasonal allergies   . Type II diabetes mellitus [facility])     Patient Active Problem List   Diagnosis Date Noted  . Osteoarthritis 11/14/2018  . Migraines 11/14/2018  . Anxiety and depression 11/14/2018  . HTN (hypertension) 11/14/2018  . Benign prostatic hyperplasia with urinary frequency 10/26/2016  . HLD (hyperlipidemia) 10/02/2015  . OSA (obstructive sleep apnea) 03/13/2015  . Seasonal allergies 03/13/2015  . GERD (gastroesophageal reflux disease) 03/13/2015  . DM type 2 (diabetes mellitus, type 2) ([place]) 10/30/2013    Past Surgical History:  Procedure Laterality Date  . CARDIAC CATHETERIZATION  02/28/2018  . LEFT HEART CATH AND CORONARY ANGIOGRAPHY N/A 02/28/2018   Procedure: LEFT HEART CATH AND CORONARY ANGIOGRAPHY;  Surgeon: Sherren Mocha, MD;  Location: [facility];  Service: Cardiovascular;  Laterality: N/A;  . WRIST GANGLION EXCISION Left 1984       Home Medications    Prior to Admission medications   Medication Sig Start Date End Date Taking? Authorizing Provider  ACCU-CHEK FASTCLIX LANCETS MISC USE TWICE A DAY AS DIRECTED 02/20/18  Yes Baity, Coralie Keens, NP  amLODipine (NORVASC) 5 MG tablet TAKE 1 TABLET BY MOUTH DAILY. 05/16/18  Yes Almyra Deforest, PA  aspirin EC 81 MG tablet Take 81 mg by mouth daily.   Yes [provider]  atorvastatin (LIPITOR) 10 MG tablet Take 1 tablet (10 mg total) by mouth daily. 11/16/18  Yes [name], NP  citalopram (CELEXA) 20 MG tablet Take 1 tablet (20 mg total) by mouth daily. 09/04/18  Yes Baity, Coralie Keens, NP  glucose blood (ACCU-CHEK GUIDE) test strip 1 each by Other route 2 (two) times daily. Use as instructed 10/05/17  Yes Baity, Coralie Keens, NP  metFORMIN (GLUCOPHAGE) 1000 MG tablet TAKE 1 TABLET BY MOUTH 2 TIMES DAILY WITH A MEAL 11/10/17  Yes Baity, Coralie Keens, NP  Multiple Vitamins-Minerals (MULTIVITAMIN WITH MINERALS) tablet Take 1 tablet by mouth daily.   Yes [provider]  Nutritional Supplements (PROTEIN SUPPLEMENT 80% PO) Take 8 oz by mouth daily.   Yes [provider]  tamsulosin (FLOMAX) 0.4 MG CAPS capsule TAKE 1 CAPSULE (0.4 MG TOTAL) BY MOUTH  DAILY. 11/21/18  Yes [name], NP  Alcohol Swabs (ALCOHOL PREP) PADS 1 each by Does not apply route 2 (two) times daily. 02/01/17   [name], NP  hydroxypropyl methylcellulose / hypromellose (ISOPTO TEARS / GONIOVISC) 2.5 % ophthalmic solution Place 1 drop into both eyes as needed for dry eyes.    [provider]  latanoprost (XALATAN) 0.005 % ophthalmic solution Place 1 drop into both eyes at bedtime.  10/25/17   [provider]  omega-3 acid ethyl esters (LOVAZA) 1 g capsule Take 1 g by mouth daily.    [provider]  predniSONE (DELTASONE) 10 MG tablet Take 3 tabs on days 1-3, take 2 tabs  on days 4-6, take 1 tab on days 7-9 11/27/18   [name], NP    Family History Family History  Problem Relation Age of Onset  . Diabetes Mother   . Hyperlipidemia Mother   . Bladder Cancer Father   . Liver cancer Sister   . Diabetes Sister   . Diabetes Maternal Aunt   . Arthritis Maternal Grandmother   . Diabetes Maternal Grandmother   . Arthritis Maternal Grandfather   . Arthritis Paternal Grandmother   . Diabetes Brother   . Heart disease Brother   . Colon cancer Neg Hx     Social History Social History   Tobacco Use  . Smoking status: Former Smoker    Packs/day: 1.00    Years: 27.00    Pack years: 27.00    Types: Cigarettes    Last attempt to quit: 10/18/2009    Years since quitting: 9.1  . Smokeless tobacco: Former Systems developer    Types: Chew    Quit date: 10/18/1994  Substance Use Topics  . Alcohol use: Yes    Alcohol/week: 2.0 standard drinks    Types: 2 Cans of beer per week  . Drug use: No     Allergies   Tape   Review of Systems Review of Systems   Physical Exam Triage Vital Signs ED Triage Vitals  Enc Vitals Group     BP 12/15/18 1653 130/88     Pulse Rate 12/15/18 1653 96     Resp --      Temp 12/15/18 1653 98.2 F (36.8 C)     Temp Source 12/15/18 1653 Temporal     SpO2 12/15/18 1653 99 %     Weight --      Height --      Head Circumference --      Peak Flow --      Pain Score 12/15/18 1654 5     Pain Loc --      Pain Edu? --      Excl. in [place]? --    No data found.  Updated Vital Signs BP 130/88 (BP Location: Right Arm)   Pulse 96   Temp 98.2 F (36.8 C) (Temporal)   SpO2 99%    Physical Exam Vitals signs reviewed.  Constitutional:      Appearance: He is well-developed.  HENT:     Head: Normocephalic and atraumatic.     Right Ear: Tympanic membrane, ear canal and external ear normal.     Left Ear: Tympanic membrane, ear canal and external ear normal.     Nose: Nose normal.     Right Sinus: No maxillary sinus tenderness or  frontal sinus tenderness.     Left Sinus: No maxillary sinus tenderness or frontal sinus tenderness.  Mouth/Throat:     Mouth: Mucous membranes are moist.     Pharynx: Uvula midline.  Eyes:     Conjunctiva/sclera: Conjunctivae normal.     Pupils: Pupils are equal, round, and reactive to light.  Neck:     Musculoskeletal: Normal range of motion.  Cardiovascular:     Rate and Rhythm: Normal rate and regular rhythm.  Pulmonary:     Effort: Pulmonary effort is normal.     Breath sounds: Normal breath sounds.     Comments: Hoarse voice noted  Lymphadenopathy:     Cervical: No cervical adenopathy.  Skin:    General: Skin is warm and dry.  Neurological:     Mental Status: He is alert and oriented to person, place, and time.      UC Treatments / Results  Labs (all labs ordered are listed, but only abnormal results are displayed) Labs Reviewed - No data to display  EKG None  Radiology No results found.  Procedures Procedures (including critical care time)  Medications Ordered in UC Medications - No data to display  Initial Impression / Assessment and Plan / UC Course  I have reviewed the triage vital signs and the nursing notes.  Pertinent labs & imaging results that were available during my care of the patient were reviewed by me and considered in my medical decision making (see chart for details).    Non toxic in appearance. Vitals stable. Benign physical exam. History and physical consistent with viral illness.  Supportive cares recommended. Return precautions provided. If symptoms worsen or do not improve in the next week to return to be seen or to follow up with PCP.  Patient verbalized understanding and agreeable to plan.   Final Clinical Impressions(s) / UC Diagnoses   Final diagnoses:  Upper respiratory tract infection, unspecified type     Discharge Instructions     Push fluids to ensure adequate hydration and keep secretions thin.  Tylenol and/or  ibuprofen as needed for pain or fevers.  Use of nasal saline and/or fluticasone nasal spray to help with nasal secretions.  Rest.  Plain mucinex as an expectorant.  If symptoms worsen or do not improve in the next week to return to be seen or to follow up with your PCP.     ED Prescriptions    None     Controlled Substance Prescriptions [place] Controlled Substance Registry consulted? Not Applicable   Zigmund Gottron, NP 12/15/18 1827

## 2018-12-15 NOTE — ED Triage Notes (Signed)
Pt complains of PND, headache, and sore throat since last night.  He also states he was very warm last night but he did not measure his temperature.  Pt has taken no OTC medications.

## 2018-12-18 ENCOUNTER — Other Ambulatory Visit: Payer: Self-pay | Admitting: Internal Medicine

## 2018-12-18 MED FILL — ACCU-CHEK FASTCLIX LANCETS: 90 days supply | Qty: 204 | Fill #2

## 2018-12-18 MED FILL — ACCU-CHEK GUIDE STRP: 90 days supply | Qty: 200 | Fill #0

## 2018-12-18 MED FILL — ATORVASTATIN 10 MG TABLET: 10 | 30 days supply | Qty: 30 | Fill #1 | Status: TO

## 2018-12-18 MED FILL — CITALOPRAM HBR 20 MG TABLET: 20 | 90 days supply | Qty: 90 | Fill #0

## 2018-12-20 ENCOUNTER — Encounter: Payer: Self-pay | Admitting: Internal Medicine

## 2018-12-20 ENCOUNTER — Ambulatory Visit (INDEPENDENT_AMBULATORY_CARE_PROVIDER_SITE_OTHER): Payer: No Typology Code available for payment source | Admitting: Internal Medicine

## 2018-12-20 VITALS — BP 130/80 | HR 75 | Temp 98.0°F | Wt 235.0 lb

## 2018-12-20 DIAGNOSIS — R0981 Nasal congestion: Secondary | ICD-10-CM

## 2018-12-20 DIAGNOSIS — H938X3 Other specified disorders of ear, bilateral: Secondary | ICD-10-CM | POA: Diagnosis not present

## 2018-12-20 DIAGNOSIS — R519 Headache, unspecified: Secondary | ICD-10-CM

## 2018-12-20 DIAGNOSIS — R059 Cough, unspecified: Secondary | ICD-10-CM

## 2018-12-20 DIAGNOSIS — J029 Acute pharyngitis, unspecified: Secondary | ICD-10-CM

## 2018-12-20 DIAGNOSIS — R05 Cough: Secondary | ICD-10-CM

## 2018-12-20 DIAGNOSIS — R5383 Other fatigue: Secondary | ICD-10-CM

## 2018-12-20 DIAGNOSIS — R51 Headache: Secondary | ICD-10-CM | POA: Diagnosis not present

## 2018-12-20 LAB — COMPREHENSIVE METABOLIC PANEL
ALT: 13 U/L (ref 0–53)
AST: 19 U/L (ref 0–37)
Albumin: 4.5 g/dL (ref 3.5–5.2)
Alkaline Phosphatase: 81 U/L (ref 39–117)
BUN: 16 mg/dL (ref 6–23)
CHLORIDE: 103 meq/L (ref 96–112)
CO2: 27 mEq/L (ref 19–32)
Calcium: 10.1 mg/dL (ref 8.4–10.5)
Creatinine, Ser: 1.09 mg/dL (ref 0.40–1.50)
GFR: 84.81 mL/min (ref >60.00)
Glucose, Bld: 94 mg/dL (ref 70–99)
Potassium: 4 mEq/L (ref 3.5–5.1)
Sodium: 138 mEq/L (ref 135–145)
Total Bilirubin: 0.4 mg/dL (ref 0.2–1.2)
Total Protein: 8 g/dL (ref 6.0–8.3)

## 2018-12-20 LAB — CBC
HCT: 42.8 % (ref 39.0–52.0)
Hemoglobin: 14.1 g/dL (ref 13.0–17.0)
MCHC: 33 g/dL (ref 30.0–36.0)
MCV: 79.1 fl (ref 78.0–100.0)
Platelets: 232 10*3/uL (ref 150.0–400.0)
RBC: 5.41 Mil/uL (ref 4.22–5.81)
RDW: 14.9 % (ref 11.5–15.5)
WBC: 6.4 10*3/uL (ref 4.0–10.5)

## 2018-12-20 LAB — POCT RAPID STREP A (OFFICE): Rapid Strep A Screen: NEGATIVE

## 2018-12-20 LAB — MONONUCLEOSIS SCREEN: MONO SCREEN: NEGATIVE

## 2018-12-20 MED ORDER — METHYLPREDNISOLONE ACETATE 80 MG/ML IJ SUSP
80.0000 mg | Freq: Once | INTRAMUSCULAR | Status: AC
  Administered 2018-12-20: 80 mg via INTRAMUSCULAR

## 2018-12-20 MED ORDER — HYDROCODONE-HOMATROPINE 5-1.5 MG/5ML PO SYRP: 5.0000 mL | ORAL_SOLUTION | Freq: Three times a day (TID) | ORAL | 0 refills | Status: DC | PRN

## 2018-12-20 MED ORDER — AMOXICILLIN 500 MG PO CAPS: 500.0000 mg | ORAL_CAPSULE | Freq: Three times a day (TID) | ORAL | 0 refills | Status: DC

## 2018-12-20 MED FILL — HYDROCODONE-HOMATROPINE SOL: 5-1.5 | 8 days supply | Qty: 120 | Fill #0

## 2018-12-20 MED FILL — AMOXICILLIN 500 MG CAPSULE: 500 | 10 days supply | Qty: 30 | Fill #0

## 2018-12-20 NOTE — Progress Notes (Signed)
HPI  Pt presents to the clinic today with c/o headache, ear fullness, sore throat and cough. He reports this started 1 week ago. The headache is located in the forehead. He describes the pain as pressure. He is having some nasal congestion. He is blowing yellow mucous out of his nose. He denies ear pain. He is having pain and burning with swallowing. The cough is non productive. It is keeping him up at night. He denies shortness of breath. He denies fever, chills or body aches. He went to UC 6 days ago for the same, was diagnosed with a URI. He was advised to take Ibuprofen, Flonase and Mucinex which he has been taking without any relief. He has a history of DM 2. He is unsure if he has had sick contacts.  Review of Systems      Past Medical History:  Diagnosis Date  . Arthritis    "knees and hips" (02/28/2018)  . Chicken pox   . GERD (gastroesophageal reflux disease)   . Heart murmur   . Hernia, abdominal   . High cholesterol   . History of gout   . Migraine    "probably monthly" (02/28/2018)  . OSA on CPAP   . Pneumonia    "once" (02/28/2018)  . Refusal of blood transfusions as patient is Jehovah's Witness   . Seasonal allergies   . Type II diabetes mellitus ([facility])     Family History  Problem Relation Age of Onset  . Diabetes Mother   . Hyperlipidemia Mother   . Bladder Cancer Father   . Liver cancer Sister   . Diabetes Sister   . Diabetes Maternal Aunt   . Arthritis Maternal Grandmother   . Diabetes Maternal Grandmother   . Arthritis Maternal Grandfather   . Arthritis Paternal Grandmother   . Diabetes Brother   . Heart disease Brother   . Colon cancer Neg Hx     Social History   Socioeconomic History  . Marital status: Married    Spouse name: Not on file  . Number of children: 2  . Years of education: Not on file  . Highest education level: Not on file  Occupational History  . 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 Financial resource strain: Not on file  .  Food insecurity:    Worry: Not on file    Inability: Not on file  . Transportation needs:    Medical: Not on file    Non-medical: Not on file  Tobacco Use  . Smoking status: Former Smoker    Packs/day: 1.00    Years: 27.00    Pack years: 27.00    Types: Cigarettes    Last attempt to quit: 10/18/2009    Years since quitting: 9.1  . Smokeless tobacco: Former Systems developer    Types: [place] date: 10/18/1994  Substance and Sexual Activity  . Alcohol use: Yes    Alcohol/week: 2.0 standard drinks    Types: 2 Cans of beer per week  . Drug use: No  . Sexual activity: Not Currently  Lifestyle  . Physical activity:    Days per week: Not on file    Minutes per session: Not on file  . Stress: Not on file  Relationships  . Social connections:    Talks on phone: Not on file    Gets together: Not on file    Attends religious service: Not on file    Active member of club or organization:  Not on file    Attends meetings of clubs or organizations: Not on file    Relationship status: Not on file  . Intimate partner violence:    Fear of current or ex partner: Not on file    Emotionally abused: Not on file    Physically abused: Not on file    Forced sexual activity: Not on file  Other Topics Concern  . Not on file  Social History Narrative  . Not on file    Allergies  Allergen Reactions  . Tape Rash    Paper tape-blisters     Constitutional: Positive headache, fatigue. Denies fever or abrupt weight changes.  HEENT:  Positive ear fullness, nasal congestion, sore throat. Denies eye redness, eye pain, pressure behind the eyes, facial pain, ear pain, ringing in the ears, wax buildup, runny nose or bloody nose. Respiratory: Positive cough. Denies difficulty breathing or shortness of breath.  Cardiovascular: Denies chest pain, chest tightness, palpitations or swelling in the hands or feet.   No other specific complaints in a complete review of systems (except as listed in HPI  above).  Objective:   There were no vitals taken for this visit. Wt Readings from Last 3 Encounters:  11/14/18 241 lb (109.3 kg)  06/27/18 245 lb (111.1 kg)  03/22/18 244 lb (110.7 kg)     General: Appears his stated age, in NAD. HEENT: Head: normal shape and size, no sinus tenderness noted; Ears: Tm's gray and intact, normal light reflex; Nose: mucosa boggy and moist, turbinates; Throat/Mouth: + PND. Teeth present, mucosa erythematous and moist, no exudate noted, no lesions or ulcerations noted.  Neck: No cervical lymphadenopathy.  Cardiovascular: Normal rate and rhythm. S1,S2 noted.  No murmur, rubs or gallops noted.  Pulmonary/Chest: Normal effort and positive vesicular breath sounds. No respiratory distress. No wheezes, rales or ronchi noted.       Assessment & Plan:   Headache, Ear Fullness, Nasal Congestion, Sore Throat, Cough and Fatigue:  DDX include Viral URI, URI, Acute Bacterial Pharyngitis, Viral Pharyngitis, Mono RST: negative Will obtain throat culture CBC, CMET and mono spot today 80 mg Depo IM today RX for Amoxil 500 mg BID x 10 days RX for Hycodan for cough  RTC as needed or if symptoms persist.   Webb Silversmith, NP

## 2018-12-20 NOTE — Addendum Note (Signed)
Addended by: Lurlean Nanny on: 12/20/2018 03:06 PM   Modules accepted: Orders

## 2018-12-20 NOTE — Patient Instructions (Signed)
Sore Throat  When you have a sore throat, your throat may feel:  · Tender.  · Burning.  · Irritated.  · Scratchy.  · Painful when you swallow.  · Painful when you talk.  Many things can cause a sore throat, such as:  · An infection.  · Allergies.  · Dry air.  · Smoke or pollution.  · Radiation treatment.  · Gastroesophageal reflux disease (GERD).  · A tumor.  A sore throat can be the first sign of another sickness. It can happen with other problems, like:  · Coughing.  · Sneezing.  · Fever.  · Swelling in the neck.  Most sore throats go away without treatment.  Follow these instructions at home:         · Take over-the-counter medicines only as told by your doctor.  ? If your child has a sore throat, do not give your child aspirin.  · Drink enough fluids to keep your pee (urine) pale yellow.  · Rest when you feel you need to.  · To help with pain:  ? Sip warm liquids, such as broth, herbal tea, or warm water.  ? Eat or drink cold or frozen liquids, such as frozen ice pops.  ? Gargle with a salt-water mixture 3-4 times a day or as needed. To make a salt-water mixture, add ½-1 tsp (3-6 g) of salt to 1 cup (237 mL) of warm water. Mix it until you cannot see the salt anymore.  ? Suck on hard candy or throat lozenges.  ? Put a cool-mist humidifier in your bedroom at night.  ? Sit in the bathroom with the door closed for 5-10 minutes while you run hot water in the shower.  · Do not use any products that contain nicotine or tobacco, such as cigarettes, e-cigarettes, and chewing tobacco. If you need help quitting, ask your doctor.  · Wash your hands well and often with soap and water. If soap and water are not available, use hand sanitizer.  Contact a doctor if:  · You have a fever for more than 2-3 days.  · You keep having symptoms for more than 2-3 days.  · Your throat does not get better in 7 days.  · You have a fever and your symptoms suddenly get worse.  · Your child who is 3 months to 3 years old has a temperature of  102.2°F (39°C) or higher.  Get help right away if:  · You have trouble breathing.  · You cannot swallow fluids, soft foods, or your saliva.  · You have swelling in your throat or neck that gets worse.  · You keep feeling sick to your stomach (nauseous).  · You keep throwing up (vomiting).  Summary  · A sore throat is pain, burning, irritation, or scratchiness in the throat. Many things can cause a sore throat.  · Take over-the-counter medicines only as told by your doctor. Do not give your child aspirin.  · Drink plenty of fluids, and rest as needed.  · Contact a doctor if your symptoms get worse or your sore throat does not get better within 7 days.  This information is not intended to replace advice given to you by your health care provider. Make sure you discuss any questions you have with your health care provider.  Document Released: 07/13/2008 Document Revised: 03/06/2018 Document Reviewed: 03/06/2018  Elsevier Interactive Patient Education © 2019 [employer] Inc[employer]

## 2018-12-22 LAB — CULTURE, GROUP A STREP
MICRO NUMBER:: 276075
SPECIMEN QUALITY:: ADEQUATE

## 2019-01-12 ENCOUNTER — Other Ambulatory Visit: Payer: Self-pay | Admitting: Internal Medicine

## 2019-01-12 MED FILL — ATORVASTATIN 10 MG TABLET: 10 | 60 days supply | Qty: 60 | Fill #0 | Status: TO

## 2019-01-12 MED FILL — metFORMIN HCL 1000 MG TABS: 1000 | 90 days supply | Qty: 180 | Fill #0

## 2019-02-19 MED FILL — TAMSULOSIN HCL 0.4 MG CAP: 0.4 | 90 days supply | Qty: 90 | Fill #0

## 2019-02-19 MED FILL — AMLODIPINE BESYLATE 5 MG TA: 5 | 30 days supply | Qty: 30 | Fill #0

## 2019-03-21 ENCOUNTER — Other Ambulatory Visit: Payer: Self-pay | Admitting: Internal Medicine

## 2019-03-21 ENCOUNTER — Other Ambulatory Visit: Payer: Self-pay | Admitting: Physician Assistant

## 2019-03-21 MED FILL — AMLODIPINE BESYLATE 5 MG TA: 5 | 30 days supply | Qty: 30 | Fill #0

## 2019-03-21 MED FILL — ACCU-CHEK GUIDE STRP: 90 days supply | Qty: 200 | Fill #1

## 2019-03-21 MED FILL — CITALOPRAM HBR 20 MG TABLET: 20 | 90 days supply | Qty: 90 | Fill #1

## 2019-03-22 ENCOUNTER — Telehealth: Payer: Self-pay | Admitting: Internal Medicine

## 2019-03-22 ENCOUNTER — Encounter: Payer: Self-pay | Admitting: Internal Medicine

## 2019-03-22 NOTE — Telephone Encounter (Signed)
Copied from [place] [id number]. Topic: General - Inquiry >> Mar 22, 2019  4:22 PM Mathis Bud wrote: Reason for CRM: Patient called stating he was exposed to [place] through co worker at [facility].  Patient has been out of work since Monday but was tested for COVID 6/2, he claims he was tested a  testing site.  I can't find anywhere in his chart where he was tested. Patient wanted to reach out to his PCP to let her know he is experiencing Tightness in chest, little bit of shortness of breath, and Coughing with cpap on.  Patient also would like to schedule an appt.  Call back # [phone]

## 2019-03-22 NOTE — Telephone Encounter (Signed)
Spoke with patient regarding symptoms and testing.  He was tested through [facility] and they will have access to his results.  Those usually aren't accessible through Epic when handled through [facility].    Also, given concerning progressing respiratory symptoms we would recommend ER evaluation especially if breathing and chest tightness is progressing. Patient made aware to call [employer] [facility] [employer] immediately for further triage and discussion for next steps and go to ER for progressing resp symptoms.  Patient verbalizes understanding.  Will FYI to Webb Silversmith, NP so we can follow up on patient as needed.

## 2019-03-23 MED FILL — ATORVASTATIN 10 MG TABLET: 10 | 30 days supply | Qty: 30 | Fill #0

## 2019-03-23 NOTE — Telephone Encounter (Signed)
noted 

## 2019-03-23 NOTE — Telephone Encounter (Signed)
See pt message 03/22/19.

## 2019-03-26 ENCOUNTER — Encounter: Payer: Self-pay | Admitting: Internal Medicine

## 2019-03-26 ENCOUNTER — Other Ambulatory Visit: Payer: Self-pay

## 2019-03-26 ENCOUNTER — Ambulatory Visit (INDEPENDENT_AMBULATORY_CARE_PROVIDER_SITE_OTHER)
Admission: RE | Admit: 2019-03-26 | Discharge: 2019-03-26 | Disposition: A | Discharge status: Home / Self Care | Payer: No Typology Code available for payment source | Source: Ambulatory Visit | Attending: Internal Medicine | Admitting: Internal Medicine

## 2019-03-26 ENCOUNTER — Ambulatory Visit (INDEPENDENT_AMBULATORY_CARE_PROVIDER_SITE_OTHER): Payer: No Typology Code available for payment source | Admitting: Internal Medicine

## 2019-03-26 DIAGNOSIS — R0789 Other chest pain: Secondary | ICD-10-CM

## 2019-03-26 DIAGNOSIS — R0981 Nasal congestion: Secondary | ICD-10-CM | POA: Diagnosis not present

## 2019-03-26 DIAGNOSIS — R0602 Shortness of breath: Secondary | ICD-10-CM | POA: Diagnosis not present

## 2019-03-26 DIAGNOSIS — R059 Cough, unspecified: Secondary | ICD-10-CM

## 2019-03-26 DIAGNOSIS — R05 Cough: Secondary | ICD-10-CM | POA: Diagnosis not present

## 2019-03-26 MED ORDER — ALBUTEROL SULFATE HFA 108 (90 BASE) MCG/ACT IN AERS: 2.0000 | INHALATION_SPRAY | Freq: Four times a day (QID) | RESPIRATORY_TRACT | 0 refills | Status: DC | PRN

## 2019-03-26 MED FILL — ALBUTEROL SULFATE HFA 108 (: 108 (90 BAS | 25 days supply | Qty: 9 | Fill #0

## 2019-03-26 NOTE — Progress Notes (Signed)
Virtual Visit via Video Note  I connected with Marc Aguilar on 03/26/19 at 10:15 AM EDT by a video enabled telemedicine application and verified that I am speaking with the correct person using two identifiers.  Location: Patient: Musician Provider: Office   I discussed the limitations of evaluation and management by telemedicine and the availability of in person appointments. The patient expressed understanding and agreed to proceed.  History of Present Illness:  Patient reports nasal congestion, cough, shortness of breath, and chest tightness. He reports this started 1 week ago. He is not blowing anything out of his nose. The cough is nonproductive. The shortness of breath and chest tightness are intermittent with exertion. He denies chest pain, lower extremity edema.  He denies reflux symptoms. He reports [employer] took him out of work on 5/28. He was tested for COVID 19 on 6/2 an those results were negative. He went to [facility] this morning for further evaluation. He reports they advised him he could return to work on Thursday or Friday. He denies fever, chills or body aches. He has not taken any medications OTC for this.   Past Medical History:  Diagnosis Date  . Arthritis    "knees and hips" (02/28/2018)  . Chicken pox   . GERD (gastroesophageal reflux disease)   . Heart murmur   . Hernia, abdominal   . High cholesterol   . History of gout   . Migraine    "probably monthly" (02/28/2018)  . OSA on CPAP   . Pneumonia    "once" (02/28/2018)  . Refusal of blood transfusions as patient is Jehovah's Witness   . Seasonal allergies   . Type II diabetes mellitus ([place])     Current Outpatient Medications  Medication Sig Dispense Refill  . ACCU-CHEK FASTCLIX LANCETS MISC USE TWICE A DAY AS DIRECTED 204 each 3  . Alcohol Swabs (ALCOHOL PREP) PADS 1 each by Does not apply route 2 (two) times daily. 200 each 3  . amLODipine (NORVASC) 5 MG tablet Take 1 tablet (5 mg total) by mouth  daily. OV NEEDED 30 tablet 1  . amoxicillin (AMOXIL) 500 MG capsule Take 1 capsule (500 mg total) by mouth 3 (three) times daily. 30 capsule 0  . aspirin EC 81 MG tablet Take 81 mg by mouth daily.    Marland Kitchen atorvastatin (LIPITOR) 10 MG tablet TAKE 1 TABLET BY MOUTH DAILY. 30 tablet 1  . citalopram (CELEXA) 20 MG tablet TAKE 1 TABLET (20 MG TOTAL) BY MOUTH DAILY. 90 tablet 2  . glucose blood test strip TEST BLOOD SUGAR AS DIRECTED TWICE DAILY 200 each 3  . HYDROcodone-homatropine (HYCODAN) 5-1.5 MG/5ML syrup Take 5 mLs by mouth every 8 (eight) hours as needed for cough. 120 mL 0  . hydroxypropyl methylcellulose / hypromellose (ISOPTO TEARS / GONIOVISC) 2.5 % ophthalmic solution Place 1 drop into both eyes as needed for dry eyes.    Marland Kitchen latanoprost (XALATAN) 0.005 % ophthalmic solution Place 1 drop into both eyes at bedtime.   6  . metFORMIN (GLUCOPHAGE) 1000 MG tablet TAKE 1 TABLET BY MOUTH TWICE A DAY WITH A MEAL 180 tablet 3  . Multiple Vitamins-Minerals (MULTIVITAMIN WITH MINERALS) tablet Take 1 tablet by mouth daily.    . Nutritional Supplements (PROTEIN SUPPLEMENT 80% PO) Take 8 oz by mouth daily.    Marland Kitchen omega-3 acid ethyl esters (LOVAZA) 1 g capsule Take 1 g by mouth daily.    . tamsulosin (FLOMAX) 0.4 MG CAPS capsule TAKE  1 CAPSULE (0.4 MG TOTAL) BY MOUTH DAILY. 90 capsule 3   No current facility-administered medications for this visit.     Allergies  Allergen Reactions  . Tape Rash    Paper tape-blisters    Family History  Problem Relation Age of Onset  . Diabetes Mother   . Hyperlipidemia Mother   . Bladder Cancer Father   . Liver cancer Sister   . Diabetes Sister   . Diabetes Maternal Aunt   . Arthritis Maternal Grandmother   . Diabetes Maternal Grandmother   . Arthritis Maternal Grandfather   . Arthritis Paternal Grandmother   . Diabetes Brother   . Heart disease Brother   . Colon cancer Neg Hx     Social History   Socioeconomic History  . Marital status: Married     Spouse name: Not on file  . Number of children: 2  . Years of education: Not on file  . Highest education level: Not on file  Occupational History  . 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 Financial resource strain: Not on file  . Food insecurity:    Worry: Not on file    Inability: Not on file  . Transportation needs:    Medical: Not on file    Non-medical: Not on file  Tobacco Use  . Smoking status: Former Smoker    Packs/day: 1.00    Years: 27.00    Pack years: 27.00    Types: Cigarettes    Last attempt to quit: 10/18/2009    Years since quitting: 9.4  . Smokeless tobacco: Former Systems developer    Types: [place] date: 10/18/1994  Substance and Sexual Activity  . Alcohol use: Yes    Alcohol/week: 2.0 standard drinks    Types: 2 Cans of beer per week  . Drug use: No  . Sexual activity: Not Currently  Lifestyle  . Physical activity:    Days per week: Not on file    Minutes per session: Not on file  . Stress: Not on file  Relationships  . Social connections:    Talks on phone: Not on file    Gets together: Not on file    Attends religious service: Not on file    Active member of club or organization: Not on file    Attends meetings of clubs or organizations: Not on file    Relationship status: Not on file  . Intimate partner violence:    Fear of current or ex partner: Not on file    Emotionally abused: Not on file    Physically abused: Not on file    Forced sexual activity: Not on file  Other Topics Concern  . Not on file  Social History Narrative  . Not on file     Constitutional: Denies fever, malaise, fatigue, headache or abrupt weight changes.  HEENT: Pt reports nasal congestion. Denies eye pain, eye redness, ear pain, ringing in the ears, wax buildup, runny nose, bloody nose, or sore throat. Respiratory: Pt reports cough and shortness of breath. Denies difficulty breathing, or sputum production.   Cardiovascular: Pt reports chest tightness. Denies chest pain,  palpitations or swelling in the hands or feet.  Gastrointestinal: Denies abdominal pain, bloating, constipation, diarrhea or blood in the stool.  Skin: Denies redness, rashes, lesions or ulcercations.  Psych: Denies anxiety, depression, SI/HI.  No other specific complaints in a complete review of systems (except as listed in HPI above).   Wt Readings from  Last 3 Encounters:  12/20/18 235 lb (106.6 kg)  11/14/18 241 lb (109.3 kg)  06/27/18 245 lb (111.1 kg)    General: Appears his stated age, obese, in NAD. HEENT: Head: normal shape and size; Eyes: EOMs intact;  Pulmonary/Chest: Normal effort. No respiratory distress. Neurological: Alert and oriented.  Psychiatric: Mood and affect normal. Behavior is normal. Judgment and thought content normal.     BMET    Component Value Date/Time   NA 138 12/20/2018 1434   K 4.0 12/20/2018 1434   CL 103 12/20/2018 1434   CO2 27 12/20/2018 1434   GLUCOSE 94 12/20/2018 1434   BUN 16 12/20/2018 1434   CREATININE 1.09 12/20/2018 1434   CALCIUM 10.1 12/20/2018 1434   GFRNONAA >60 02/28/2018 1723   GFRAA >60 02/28/2018 1723    Lipid Panel     Component Value Date/Time   CHOL 188 11/14/2018 0935   TRIG 62.0 11/14/2018 0935   HDL 42.60 11/14/2018 0935   CHOLHDL 4 11/14/2018 0935   VLDL 12.4 11/14/2018 0935   LDLCALC 133 (H) 11/14/2018 0935    CBC    Component Value Date/Time   WBC 6.4 12/20/2018 1434   RBC 5.41 12/20/2018 1434   HGB 14.1 12/20/2018 1434   HCT 42.8 12/20/2018 1434   PLT 232.0 12/20/2018 1434   MCV 79.1 12/20/2018 1434   MCH 25.6 (L) 02/28/2018 1723   MCHC 33.0 12/20/2018 1434   RDW 14.9 12/20/2018 1434   LYMPHSABS 3.0 02/28/2018 0300   MONOABS 0.4 02/28/2018 0300   EOSABS 0.2 02/28/2018 0300   BASOSABS 0.0 02/28/2018 0300    Hgb A1C Lab Results  Component Value Date   HGBA1C 5.4 11/14/2018        Assessment and Plan:  Nasal Congestion, Cough, Shortness of Breath, Chest Tightness:  COVID 19  negative Will have him come here for chest xray RX for Albuterol Inhaler 2 puffs Q4-6 hours as needed Consider further cardiac workup pending xray results Will hold off on work note at this time  Will follow up after xray results, return precautions discussed  Follow Up Instructions:    I discussed the assessment and treatment plan with the patient. The patient was provided an opportunity to ask questions and all were answered. The patient agreed with the plan and demonstrated an understanding of the instructions.   The patient was advised to call back or seek an in-person evaluation if the symptoms worsen or if the condition fails to improve as anticipated.  Webb Silversmith, NP

## 2019-03-26 NOTE — Patient Instructions (Signed)
Shortness of Breath, Adult  Shortness of breath means you have trouble breathing. Shortness of breath could be a sign of a medical problem.  Follow these instructions at home:     Watch for any changes in your symptoms.   Do not use any products that contain nicotine or tobacco, such as cigarettes, e-cigarettes, and chewing tobacco.   Do not smoke. Smoking can cause shortness of breath. If you need help to quit smoking, ask your doctor.   Avoid things that can make it harder to breathe, such as:  ? Mold.  ? Dust.  ? Air pollution.  ? Chemical smells.  ? Things that can cause allergy symptoms (allergens), if you have allergies.   Keep your living space clean. Use products that help remove mold and dust.   Rest as needed. Slowly return to your normal activities.   Take over-the-counter and prescription medicines only as told by your doctor. This includes oxygen therapy and inhaled medicines.   Keep all follow-up visits as told by your doctor. This is important.  Contact a doctor if:   Your condition does not get better as soon as expected.   You have a hard time doing your normal activities, even after you rest.   You have new symptoms.  Get help right away if:   Your shortness of breath gets worse.   You have trouble breathing when you are resting.   You feel light-headed or you pass out (faint).   You have a cough that is not helped by medicines.   You cough up blood.   You have pain with breathing.   You have pain in your chest, arms, shoulders, or belly (abdomen).   You have a fever.   You cannot walk up stairs.   You cannot exercise the way you normally do.  These symptoms may represent a serious problem that is an emergency. Do not wait to see if the symptoms will go away. Get medical help right away. Call your local emergency services (911 in the [place] Do not drive yourself to the hospital.  Summary   Shortness of breath is when you have trouble breathing enough air. It can be a sign of a  medical problem.   Avoid things that make it hard for you to breathe, such as smoking, pollution, mold, and dust.   Watch for any changes in your symptoms. Contact your doctor if you do not get better or you get worse.  This information is not intended to replace advice given to you by your health care provider. Make sure you discuss any questions you have with your health care provider.  Document Released: 03/22/2008 Document Revised: 03/06/2018 Document Reviewed: 03/06/2018  Elsevier Interactive Patient Education  2019 Elsevier Inc.

## 2019-04-30 MED FILL — AMLODIPINE BESYLATE 5 MG TA: 5 | 30 days supply | Qty: 30 | Fill #1

## 2019-04-30 MED FILL — ATORVASTATIN 10 MG TABLET: 10 | 30 days supply | Qty: 30 | Fill #1

## 2019-05-21 MED FILL — metFORMIN HCL 1000 MG TABS: 1000 | 90 days supply | Qty: 180 | Fill #0

## 2019-06-04 ENCOUNTER — Encounter: Payer: Self-pay | Admitting: Internal Medicine

## 2019-06-11 ENCOUNTER — Other Ambulatory Visit: Payer: Self-pay | Admitting: Internal Medicine

## 2019-06-11 ENCOUNTER — Other Ambulatory Visit: Payer: Self-pay | Admitting: Physician Assistant

## 2019-06-11 MED FILL — TAMSULOSIN HCL 0.4 MG CAP: 0.4 | 90 days supply | Qty: 90 | Fill #0

## 2019-06-11 MED FILL — AMLODIPINE BESYLATE 5 MG TA: 5 | 30 days supply | Qty: 30 | Fill #0

## 2019-06-11 MED FILL — CITALOPRAM HBR 20 MG TABLET: 20 | 90 days supply | Qty: 90 | Fill #2

## 2019-06-13 MED FILL — ATORVASTATIN 10 MG TABLET: 10 | 30 days supply | Qty: 30 | Fill #0

## 2019-07-30 ENCOUNTER — Encounter: Payer: Self-pay | Admitting: Internal Medicine

## 2019-07-30 ENCOUNTER — Other Ambulatory Visit: Payer: Self-pay | Admitting: Internal Medicine

## 2019-07-30 MED FILL — ATORVASTATIN 10 MG TABLET: 10 | 30 days supply | Qty: 30 | Fill #1

## 2019-08-01 ENCOUNTER — Encounter: Payer: Self-pay | Admitting: Internal Medicine

## 2019-08-01 ENCOUNTER — Other Ambulatory Visit: Payer: Self-pay

## 2019-08-01 ENCOUNTER — Ambulatory Visit (INDEPENDENT_AMBULATORY_CARE_PROVIDER_SITE_OTHER): Payer: No Typology Code available for payment source | Admitting: Internal Medicine

## 2019-08-01 VITALS — BP 132/84 | HR 89 | Temp 97.9°F | Wt 247.0 lb

## 2019-08-01 DIAGNOSIS — R1030 Lower abdominal pain, unspecified: Secondary | ICD-10-CM | POA: Diagnosis not present

## 2019-08-01 DIAGNOSIS — R972 Elevated prostate specific antigen [PSA]: Secondary | ICD-10-CM | POA: Diagnosis not present

## 2019-08-01 DIAGNOSIS — R350 Frequency of micturition: Secondary | ICD-10-CM

## 2019-08-01 DIAGNOSIS — N401 Enlarged prostate with lower urinary tract symptoms: Secondary | ICD-10-CM

## 2019-08-01 DIAGNOSIS — R3915 Urgency of urination: Secondary | ICD-10-CM

## 2019-08-01 LAB — POC URINALSYSI DIPSTICK (AUTOMATED)
Bilirubin, UA: NEGATIVE
Blood, UA: NEGATIVE
Glucose, UA: NEGATIVE
Ketones, UA: NEGATIVE
Leukocytes, UA: NEGATIVE
Nitrite, UA: NEGATIVE
Protein, UA: NEGATIVE
Spec Grav, UA: 1.015 (ref 1.010–1.025)
Urobilinogen, UA: 0.2 E.U./dL
pH, UA: 7 (ref 5.0–8.0)

## 2019-08-01 MED FILL — AMLODIPINE BESYLATE 5 MG TA: 5 | 30 days supply | Qty: 30 | Fill #1

## 2019-08-01 NOTE — Progress Notes (Signed)
Subjective:    Patient ID: Marc Aguilar, male    DOB: 25-Nov-1960, 58 y.o.   MRN: [id number]  HPI  Pt presents to the clinic today with concern of elevated PSA. He reports he had his PSA checked and it was 5.9 (by fire department). His last few readings include: 3.4, 10/2017, 2.41 10/2016, 1.36 09/2015, 0.52 10/2013. He does have some lower abdominal pain, urinary urgency and frequency. He has no family history of prostate cancer but he has a history of BPH.  Review of Systems      Past Medical History:  Diagnosis Date  . Arthritis    "knees and hips" (02/28/2018)  . Chicken pox   . GERD (gastroesophageal reflux disease)   . Heart murmur   . Hernia, abdominal   . High cholesterol   . History of gout   . Migraine    "probably monthly" (02/28/2018)  . OSA on CPAP   . Pneumonia    "once" (02/28/2018)  . Refusal of blood transfusions as patient is Jehovah's Witness   . Seasonal allergies   . Type II diabetes mellitus ([place])     Current Outpatient Medications  Medication Sig Dispense Refill  . ACCU-CHEK FASTCLIX LANCETS MISC USE TWICE A DAY AS DIRECTED 204 each 3  . albuterol (VENTOLIN HFA) 108 (90 Base) MCG/ACT inhaler Inhale 2 puffs into the lungs every 6 (six) hours as needed for wheezing or shortness of breath. 1 Inhaler 0  . Alcohol Swabs (ALCOHOL PREP) PADS 1 each by Does not apply route 2 (two) times daily. 200 each 3  . amLODipine (NORVASC) 5 MG tablet TAKE 1 TABLET (5 MG TOTAL) BY MOUTH DAILY. OFFICE VISIT NEEDED 30 tablet 1  . aspirin EC 81 MG tablet Take 81 mg by mouth daily.    Marland Kitchen atorvastatin (LIPITOR) 10 MG tablet TAKE 1 TABLET BY MOUTH DAILY. 30 tablet 1  . citalopram (CELEXA) 20 MG tablet TAKE 1 TABLET BY MOUTH DAILY. 90 tablet 0  . glucose blood test strip TEST BLOOD SUGAR AS DIRECTED TWICE DAILY 200 each 3  . hydroxypropyl methylcellulose / hypromellose (ISOPTO TEARS / GONIOVISC) 2.5 % ophthalmic solution Place 1 drop into both eyes as needed for dry eyes.    Marland Kitchen  latanoprost (XALATAN) 0.005 % ophthalmic solution Place 1 drop into both eyes at bedtime.   6  . metFORMIN (GLUCOPHAGE) 1000 MG tablet TAKE 1 TABLET BY MOUTH TWICE A DAY WITH A MEAL 180 tablet 3  . Multiple Vitamins-Minerals (MULTIVITAMIN WITH MINERALS) tablet Take 1 tablet by mouth daily.    . Nutritional Supplements (PROTEIN SUPPLEMENT 80% PO) Take 8 oz by mouth daily.    Marland Kitchen omega-3 acid ethyl esters (LOVAZA) 1 g capsule Take 1 g by mouth daily.    . tamsulosin (FLOMAX) 0.4 MG CAPS capsule TAKE 1 CAPSULE (0.4 MG TOTAL) BY MOUTH DAILY. 90 capsule 3   No current facility-administered medications for this visit.     Allergies  Allergen Reactions  . Tape Rash    Paper tape-blisters    Family History  Problem Relation Age of Onset  . Diabetes Mother   . Hyperlipidemia Mother   . Bladder Cancer Father   . Liver cancer Sister   . Diabetes Sister   . Diabetes Maternal Aunt   . Arthritis Maternal Grandmother   . Diabetes Maternal Grandmother   . Arthritis Maternal Grandfather   . Arthritis Paternal Grandmother   . Diabetes Brother   . Heart disease Brother   .  Colon cancer Neg Hx     Social History   Socioeconomic History  . Marital status: Married    Spouse name: Not on file  . Number of children: 2  . Years of education: Not on file  . Highest education level: Not on file  Occupational History  . 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 Financial resource strain: Not on file  . Food insecurity    Worry: Not on file    Inability: Not on file  . Transportation needs    Medical: Not on file    Non-medical: Not on file  Tobacco Use  . Smoking status: Former Smoker    Packs/day: 1.00    Years: 27.00    Pack years: 27.00    Types: Cigarettes    Quit date: 10/18/2009    Years since quitting: 9.7  . Smokeless tobacco: Former Systems developer    Types: [place] date: 10/18/1994  Substance and Sexual Activity  . Alcohol use: Yes    Alcohol/week: 2.0 standard drinks    Types: 2  Cans of beer per week  . Drug use: No  . Sexual activity: Not Currently  Lifestyle  . Physical activity    Days per week: Not on file    Minutes per session: Not on file  . Stress: Not on file  Relationships  . Social Herbalist on phone: Not on file    Gets together: Not on file    Attends religious service: Not on file    Active member of club or organization: Not on file    Attends meetings of clubs or organizations: Not on file    Relationship status: Not on file  . Intimate partner violence    Fear of current or ex partner: Not on file    Emotionally abused: Not on file    Physically abused: Not on file    Forced sexual activity: Not on file  Other Topics Concern  . Not on file  Social History Narrative  . Not on file     Constitutional: Denies fever, malaise, fatigue, headache or abrupt weight changes.  Respiratory: Denies difficulty breathing, shortness of breath, cough or sputum production.   Cardiovascular: Denies chest pain, chest tightness, palpitations or swelling in the hands or feet.  Gastrointestinal: Pt reports lower abdominal pain. Denies bloating, constipation, diarrhea or blood in the stool.  GU: Pt reports urinary urgency, frequency. Denies pain with urination, burning sensation, blood in urine, odor or discharge.   No other specific complaints in a complete review of systems (except as listed in HPI above).  Objective:   Physical Exam   BP 132/84   Pulse 89   Temp 97.9 F (36.6 C) (Temporal)   Wt 247 lb (112 kg)   SpO2 97%   BMI 35.44 kg/m  Wt Readings from Last 3 Encounters:  08/01/19 247 lb (112 kg)  12/20/18 235 lb (106.6 kg)  11/14/18 241 lb (109.3 kg)    General: Appea his stated age, obese, in NAD.  Cardiovascular: Normal rate and rhythm. S1,S2 noted.  No murmur, rubs or gallops noted.  Pulmonary/Chest: Normal effort and positive vesicular breath sounds. No respiratory distress. No wheezes, rales or ronchi noted.  Abdomen:  Soft and nontender. Normal bowel sounds. No distention or masses noted. Liver, spleen and kidneys non palpable. Rectal: Normal rectal tone. Prostate enlarged without palpable nodules. Neurological: Alert and oriented.   BMET    Component  Value Date/Time   NA 138 12/20/2018 1434   K 4.0 12/20/2018 1434   CL 103 12/20/2018 1434   CO2 27 12/20/2018 1434   GLUCOSE 94 12/20/2018 1434   BUN 16 12/20/2018 1434   CREATININE 1.09 12/20/2018 1434   CALCIUM 10.1 12/20/2018 1434   GFRNONAA >60 02/28/2018 1723   GFRAA >60 02/28/2018 1723    Lipid Panel     Component Value Date/Time   CHOL 188 11/14/2018 0935   TRIG 62.0 11/14/2018 0935   HDL 42.60 11/14/2018 0935   CHOLHDL 4 11/14/2018 0935   VLDL 12.4 11/14/2018 0935   LDLCALC 133 (H) 11/14/2018 0935    CBC    Component Value Date/Time   WBC 6.4 12/20/2018 1434   RBC 5.41 12/20/2018 1434   HGB 14.1 12/20/2018 1434   HCT 42.8 12/20/2018 1434   PLT 232.0 12/20/2018 1434   MCV 79.1 12/20/2018 1434   MCH 25.6 (L) 02/28/2018 1723   MCHC 33.0 12/20/2018 1434   RDW 14.9 12/20/2018 1434   LYMPHSABS 3.0 02/28/2018 0300   MONOABS 0.4 02/28/2018 0300   EOSABS 0.2 02/28/2018 0300   BASOSABS 0.0 02/28/2018 0300    Hgb A1C Lab Results  Component Value Date   HGBA1C 5.4 11/14/2018           Assessment & Plan:   Enlarged Prostate, Elevated PSA, Urinary Frequency, Urinary Urgency, Lower Abdominal Pain:  Urinalysis: normal PSA checked in lab prior to exam Continue Flomax Will refer to urology for further evaluation  Return precautions discussed Webb Silversmith, NP

## 2019-08-01 NOTE — Patient Instructions (Signed)

## 2019-08-02 LAB — PSA: PSA: 5.47 ng/mL — ABNORMAL HIGH (ref 0.10–4.00)

## 2019-08-09 NOTE — Addendum Note (Signed)
Addended by: Lurlean Nanny on: 08/09/2019 10:31 AM   Modules accepted: Orders

## 2019-08-29 ENCOUNTER — Other Ambulatory Visit: Payer: Self-pay | Admitting: Physician Assistant

## 2019-08-29 ENCOUNTER — Telehealth: Payer: Self-pay

## 2019-08-29 ENCOUNTER — Other Ambulatory Visit: Payer: Self-pay | Admitting: Internal Medicine

## 2019-08-29 MED FILL — ATORVASTATIN 10 MG TABLET: 10 | 30 days supply | Qty: 30 | Fill #0

## 2019-08-29 MED FILL — AMLODIPINE BESYLATE 5 MG TA: 5 | 15 days supply | Qty: 15 | Fill #0

## 2019-08-29 MED FILL — TAMSULOSIN HCL 0.4 MG CAP: 0.4 | 90 days supply | Qty: 90 | Fill #1

## 2019-08-29 MED FILL — metFORMIN HCL 1000 MG TABS: 1000 | 90 days supply | Qty: 180 | Fill #1

## 2019-08-29 NOTE — Telephone Encounter (Signed)
Left a message for the patient to return our call regarding a past due follow up appointment.

## 2019-08-30 ENCOUNTER — Encounter: Payer: Self-pay | Admitting: Urology

## 2019-08-30 ENCOUNTER — Ambulatory Visit (INDEPENDENT_AMBULATORY_CARE_PROVIDER_SITE_OTHER): Payer: No Typology Code available for payment source | Admitting: Urology

## 2019-08-30 ENCOUNTER — Other Ambulatory Visit: Payer: Self-pay

## 2019-08-30 VITALS — BP 127/85 | HR 83 | Ht 71.0 in | Wt 245.0 lb

## 2019-08-30 DIAGNOSIS — N402 Nodular prostate without lower urinary tract symptoms: Secondary | ICD-10-CM | POA: Diagnosis not present

## 2019-08-30 DIAGNOSIS — R972 Elevated prostate specific antigen [PSA]: Secondary | ICD-10-CM | POA: Diagnosis not present

## 2019-08-30 NOTE — Patient Instructions (Signed)

## 2019-08-30 NOTE — Progress Notes (Signed)
08/30/2019 8:51 AM   [name] 1961-01-14 [id number]  Referring provider: [name], NP [address] [place],  [place] [address]  Chief Complaint  Patient presents with  . Elevated PSA    New Patient    HPI: 58 year old male referred for further evaluation of elevated PSA.  He underwent routine annual PSA screening.  His PSA was noted to be 5.47 on 08/01/2019.  Urinalysis at the time was unremarkable.  Notably, this was checked previously by the fire department at which time his PSA was 5.9.  He has other previous readings as below.  He denies any urinary symptoms.  He does get up once or twice at night to void but this not bothersome to him.  He feels like he empties his bladder.  No history of UTIs or gross hematuria.  No family history of prostate cancer.  He notes that his father died of bladder cancer.  His sister either had liver or lung cancer, not sure the origin.  PSA trend:  0.5-10/2013 1.36 09/2015 2.41 10/2016 3.4   10/2017 5.47 07/2019   PMH: Past Medical History:  Diagnosis Date  . Arthritis    "knees and hips" (02/28/2018)  . Chicken pox   . GERD (gastroesophageal reflux disease)   . Heart murmur   . Hernia, abdominal   . High cholesterol   . History of gout   . Migraine    "probably monthly" (02/28/2018)  . OSA on CPAP   . Pneumonia    "once" (02/28/2018)  . Refusal of blood transfusions as patient is Jehovah's Witness   . Seasonal allergies   . Type II diabetes mellitus ([place])     Surgical History: Past Surgical History:  Procedure Laterality Date  . CARDIAC CATHETERIZATION  02/28/2018  . LEFT HEART CATH AND CORONARY ANGIOGRAPHY N/A 02/28/2018   Procedure: LEFT HEART CATH AND CORONARY ANGIOGRAPHY;  Surgeon: Sherren Mocha, MD;  Location: [facility];  Service: Cardiovascular;  Laterality: N/A;  . WRIST GANGLION EXCISION Left 1984    Home Medications:  Allergies as of 08/30/2019      Reactions   Tape Rash   Paper  tape-blisters      Medication List       Accurate as of August 30, 2019  8:51 AM. If you have any questions, ask your nurse or doctor.        STOP taking these medications   latanoprost 0.005 % ophthalmic solution Commonly known as: XALATAN Stopped by: Hollice Espy, MD     TAKE these medications   Accu-Chek FastClix Lancets Misc USE TWICE A DAY AS DIRECTED   albuterol 108 (90 Base) MCG/ACT inhaler Commonly known as: VENTOLIN HFA Inhale 2 puffs into the lungs every 6 (six) hours as needed for wheezing or shortness of breath.   Alcohol Prep Pads 1 each by Does not apply route 2 (two) times daily.   amLODipine 5 MG tablet Commonly known as: NORVASC TAKE 1 TABLET (5 MG TOTAL) BY MOUTH DAILY. OFFICE VISIT NEEDED   aspirin EC 81 MG tablet Take 81 mg by mouth daily.   atorvastatin 10 MG tablet Commonly known as: LIPITOR TAKE 1 TABLET BY MOUTH DAILY.   citalopram 20 MG tablet Commonly known as: CELEXA TAKE 1 TABLET BY MOUTH DAILY.   glucose blood test strip TEST BLOOD SUGAR AS DIRECTED TWICE DAILY   hydroxypropyl methylcellulose / hypromellose 2.5 % ophthalmic solution Commonly known as: ISOPTO TEARS / [place] 1  drop into both eyes as needed for dry eyes.   metFORMIN 1000 MG tablet Commonly known as: GLUCOPHAGE TAKE 1 TABLET BY MOUTH TWICE A DAY WITH A MEAL   multivitamin with minerals tablet Take 1 tablet by mouth daily.   omega-3 acid ethyl esters 1 g capsule Commonly known as: LOVAZA Take 1 g by mouth daily.   PROTEIN SUPPLEMENT 80% PO Take 8 oz by mouth daily.   tamsulosin 0.4 MG Caps capsule Commonly known as: FLOMAX TAKE 1 CAPSULE (0.4 MG TOTAL) BY MOUTH DAILY.       Allergies:  Allergies  Allergen Reactions  . Tape Rash    Paper tape-blisters    Family History: Family History  Problem Relation Age of Onset  . Diabetes Mother   . Hyperlipidemia Mother   . Bladder Cancer Father   . Liver cancer Sister   . Diabetes Sister    . Diabetes Maternal Aunt   . Arthritis Maternal Grandmother   . Diabetes Maternal Grandmother   . Arthritis Maternal Grandfather   . Arthritis Paternal Grandmother   . Diabetes Brother   . Heart disease Brother   . Colon cancer Neg Hx     Social History:  reports that he quit smoking about 9 years ago. His smoking use included cigarettes. He has a 27.00 pack-year smoking history. He quit smokeless tobacco use about 24 years ago.  His smokeless tobacco use included chew. He reports current alcohol use of about 2.0 standard drinks of alcohol per week. He reports that he does not use drugs.  ROS: UROLOGY Frequent Urination?: Yes Hard to postpone urination?: Yes Burning/pain with urination?: No Get up at night to urinate?: Yes Leakage of urine?: Yes Urine stream starts and stops?: No Trouble starting stream?: No Do you have to strain to urinate?: No Blood in urine?: No Urinary tract infection?: No Sexually transmitted disease?: No Injury to kidneys or bladder?: No Painful intercourse?: No Weak stream?: No Erection problems?: No Penile pain?: No  Gastrointestinal Nausea?: No Vomiting?: No Indigestion/heartburn?: No Diarrhea?: No Constipation?: No  Constitutional Fever: No Night sweats?: No Weight loss?: No Fatigue?: No  Skin Skin rash/lesions?: No Itching?: No  Eyes Blurred vision?: No Double vision?: No  Ears/Nose/Throat Sore throat?: No Sinus problems?: No  Hematologic/Lymphatic Swollen glands?: No Easy bruising?: No  Cardiovascular Leg swelling?: No Chest pain?: No  Respiratory Cough?: No Shortness of breath?: No  Endocrine Excessive thirst?: No  Musculoskeletal Back pain?: No Joint pain?: No  Neurological Headaches?: No Dizziness?: No  Psychologic Depression?: No Anxiety?: No  Physical Exam: BP 127/85   Pulse 83   Ht 5\' 11"  (1.803 m)   Wt 245 lb (111.1 kg)   BMI 34.17 kg/m   Constitutional:  Alert and oriented, No acute  distress. HEENT:  AT, moist mucus membranes.  Trachea midline, no masses. Cardiovascular: No clubbing, cyanosis, or edema. Respiratory: Normal respiratory effort, no increased work of breathing. GI: Abdomen is soft, nontender, nondistended, no abdominal masses, obese Rectal: Normal sphincter tone.  40 cc prostate with firm nodule at left base, approximately 1 cm. Skin: No rashes, bruises or suspicious lesions. Neurologic: Grossly intact, no focal deficits, moving all 4 extremities. Psychiatric: Normal mood and affect.  Laboratory Data: Lab Results  Component Value Date   WBC 6.4 12/20/2018   HGB 14.1 12/20/2018   HCT 42.8 12/20/2018   MCV 79.1 12/20/2018   PLT 232.0 12/20/2018    Lab Results  Component Value Date   CREATININE 1.09 12/20/2018  Lab Results  Component Value Date   PSA 5.47 (H) 08/01/2019   PSA 3.40 11/10/2017   PSA 2.41 10/26/2016    Lab Results  Component Value Date   HGBA1C 5.4 11/14/2018    Urinalysis    Component Value Date/Time   BILIRUBINUR neg 08/01/2019 1030   PROTEINUR Negative 08/01/2019 1030   UROBILINOGEN 0.2 08/01/2019 1030   NITRITE neg 08/01/2019 1030   LEUKOCYTESUR Negative 08/01/2019 1030    Assessment & Plan:    1. Elevated PSA  We reviewed the implications of an elevated PSA and the uncertainty surrounding it. In general, a man's PSA increases with age and is produced by both normal and cancerous prostate tissue. Differential for elevated PSA is BPH, prostate cancer, infection, recent intercourse/ejaculation, prostate infarction, recent urethroscopic manipulation (foley placement/cystoscopy) and prostatitis. Management of an elevated PSA can include observation or prostate biopsy and wediscussed this in detail. We discussed that indications for prostate biopsy are defined by age and race specific PSA cutoffs as well as a PSA velocity of 0.75/year.  His overall trend is very concerning.  In addition, he is a nodule which  correlates with his PSA velocity.  Of highly concerned about prostate cancer.  I have strongly recommended prostate biopsy to which he is agreeable.  We discussed prostate biopsy in detail including the procedure itself, the risks of blood in the urine, stool, and ejaculate, serious infection, and discomfort. He is willing to proceed with this as discussed.  He will need to hold his baby aspirin in anticipation.  He takes this for prophylaxis. - PSA  2. Prostate nodule As above    Return for schedule Prostate biopsy ASAP, MD follow up.  Hollice Espy, MD  [facility] [address] [place], [place] [address] [phone]

## 2019-08-31 LAB — PSA: Prostate Specific Ag, Serum: 6.7 ng/mL — ABNORMAL HIGH (ref 0.0–4.0)

## 2019-09-18 ENCOUNTER — Other Ambulatory Visit: Payer: Self-pay

## 2019-09-18 ENCOUNTER — Ambulatory Visit (INDEPENDENT_AMBULATORY_CARE_PROVIDER_SITE_OTHER): Payer: No Typology Code available for payment source | Admitting: Urology

## 2019-09-18 ENCOUNTER — Other Ambulatory Visit: Payer: Self-pay | Admitting: Urology

## 2019-09-18 ENCOUNTER — Encounter: Payer: Self-pay | Admitting: Urology

## 2019-09-18 VITALS — BP 138/74 | HR 74 | Ht 71.0 in | Wt 245.0 lb

## 2019-09-18 DIAGNOSIS — N402 Nodular prostate without lower urinary tract symptoms: Secondary | ICD-10-CM | POA: Diagnosis not present

## 2019-09-18 DIAGNOSIS — R972 Elevated prostate specific antigen [PSA]: Secondary | ICD-10-CM | POA: Diagnosis not present

## 2019-09-18 MED ORDER — LEVOFLOXACIN 500 MG PO TABS
500.0000 mg | ORAL_TABLET | Freq: Once | ORAL | Status: AC
  Administered 2019-09-18: 500 mg via ORAL

## 2019-09-18 MED ORDER — GENTAMICIN SULFATE 40 MG/ML IJ SOLN
40.0000 mg | Freq: Once | INTRAMUSCULAR | Status: AC
  Administered 2019-09-18: 40 mg via INTRAMUSCULAR

## 2019-09-18 NOTE — Progress Notes (Signed)
   09/18/19  CC:  Chief Complaint  Patient presents with  . Prostate Biopsy    HPI: 58 year old male with elevated/rising PSA for prostate biopsy.  Blood pressure 138/74, pulse 74, height 5\' 11"  (1.803 m), weight 245 lb (111.1 kg). NED. A&Ox3.   No respiratory distress   Abd soft, NT, ND Normal sphincter tone  Prostate Biopsy Procedure   Informed consent was obtained after discussing risks/benefits of the procedure.  A time out was performed to ensure correct patient identity.  Pre-Procedure: - Last PSA Level:  Lab Results  Component Value Date   PSA 5.47 (H) 08/01/2019   PSA 3.40 11/10/2017   PSA 2.41 10/26/2016   - Gentamicin given prophylactically - Levaquin 500 mg administered PO -Transrectal Ultrasound performed revealing a 23.24 gm prostate -No significant hypoechoic or median lobe noted.  Prominent transition zone.  Multiple diffuse calcifications.    Procedure: - Prostate block performed using 10 cc 1% lidocaine and biopsies taken from sextant areas, a total of 12 under ultrasound guidance.  Post-Procedure: - Patient tolerated the procedure well - He was counseled to seek immediate medical attention if experiences any severe pain, significant bleeding, or fevers - Return in two week to discuss biopsy results   Hollice Espy, MD

## 2019-09-25 LAB — ANATOMIC PATHOLOGY REPORT: PDF Image: 0

## 2019-09-26 ENCOUNTER — Telehealth: Payer: Self-pay | Admitting: Urology

## 2019-09-26 DIAGNOSIS — C61 Malignant neoplasm of prostate: Secondary | ICD-10-CM

## 2019-09-26 NOTE — Telephone Encounter (Signed)
I called and left a voicemail on this patient's machine to rturn my call, awating call back.  I would like him to have a CT scan and bone scan prior to her appointment next week for high risk prostate cancer.  Orders placed.  This will help guide our discussion and treatment options moving forward.  Hollice Espy, MD

## 2019-09-27 NOTE — Telephone Encounter (Signed)
I did get to speak with Marc Aguilar today and gave him his results, I told him that you had ordered a CT scan and Bone scan and why you had ordered them. He was reassured that you would explain everything with him at his appointment next week and he was ok with all of this. I asked if he had any questions and he did not. I emailed [name] to call him today at 10:00 per his request to get these scheduled before his app   [name]

## 2019-10-01 ENCOUNTER — Encounter
Admission: RE | Admit: 2019-10-01 | Discharge: 2019-10-01 | Disposition: A | Discharge status: Home / Self Care | Payer: No Typology Code available for payment source | Source: Ambulatory Visit | Attending: Urology | Admitting: Urology

## 2019-10-01 ENCOUNTER — Other Ambulatory Visit: Payer: Self-pay

## 2019-10-01 ENCOUNTER — Ambulatory Visit
Admission: RE | Admit: 2019-10-01 | Discharge: 2019-10-01 | Disposition: A | Discharge status: Home / Self Care | Payer: No Typology Code available for payment source | Source: Ambulatory Visit | Attending: Urology | Admitting: Urology

## 2019-10-01 DIAGNOSIS — C61 Malignant neoplasm of prostate: Secondary | ICD-10-CM | POA: Insufficient documentation

## 2019-10-01 HISTORY — DX: Malignant (primary) neoplasm, unspecified: C80.1

## 2019-10-01 LAB — POCT I-STAT CREATININE: Creatinine, Ser: 1 mg/dL (ref 0.61–1.24)

## 2019-10-01 MED ORDER — IOHEXOL 300 MG/ML  SOLN
125.0000 mL | Freq: Once | INTRAMUSCULAR | Status: AC | PRN
  Administered 2019-10-01: 13:00:00 125 mL via INTRAVENOUS

## 2019-10-01 MED ORDER — TECHNETIUM TC 99M MEDRONATE IV KIT
20.0000 | PACK | Freq: Once | INTRAVENOUS | Status: AC | PRN
  Administered 2019-10-01: 23.71 via INTRAVENOUS

## 2019-10-02 ENCOUNTER — Ambulatory Visit (INDEPENDENT_AMBULATORY_CARE_PROVIDER_SITE_OTHER): Payer: No Typology Code available for payment source | Admitting: Urology

## 2019-10-02 ENCOUNTER — Encounter: Payer: Self-pay | Admitting: Urology

## 2019-10-02 ENCOUNTER — Other Ambulatory Visit: Payer: Self-pay | Admitting: Urology

## 2019-10-02 VITALS — BP 156/91 | HR 88 | Ht 71.0 in | Wt 245.0 lb

## 2019-10-02 DIAGNOSIS — C61 Malignant neoplasm of prostate: Secondary | ICD-10-CM

## 2019-10-02 NOTE — Progress Notes (Signed)
10/02/2019 2:30 PM   Marc Aguilar January 26, 1961 [id number]  Referring provider: [name], NP [address] [place],  [place] [address]  Chief Complaint  Patient presents with  . Results    HPI: 58 yo with newly diagnosed prostate cancer returns today to discuss his prostate biopsy results.  He initially presented with rising PSA which has been steadily rising over the past 5 years.  Most recent PSA 5.47. Rectal exam with firm nodule at left base.  Prostate biopsy results reveal evidence of high risk prostate cancer up to Gleason 4+4, involving 5 of 12 cores on the left and one isolated low risk core on the right.  Highest volume 80% of 4+4 at the left base.  Metastatic evaluation including CT abdomen pelvis with contrast as well as bone scan negative for any evidence of metastatic disease.  No previous abdominal surgeries.  IPSS and Shim as below for baseline.  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  IPSS    Row Name 10/02/19 1100         International Prostate Symptom Score   How often have you had the sensation of not emptying your bladder?  Not at All     How often have you had to urinate less than every two hours?  Almost always     How often have you found you stopped and started again several times when you urinated?  Less than 1 in 5 times     How often have you found it difficult to postpone urination?  Almost always     How often have you had a weak urinary stream?  About half the time     How often have you had to strain to start urination?  Not at All     How many times did you typically get up at night to urinate?  2 Times     Total IPSS Score  16       Quality of Life due to urinary symptoms   If you were to spend the rest of your life with your urinary condition just the way it is now how would you feel about that?  Mixed        Score:  1-7 Mild 8-19 Moderate 20-35 Severe   SHIM    Row Name 10/02/19 1222         SHIM: Over the last 6 months:   How do you rate your confidence that you could get and keep an erection?  Moderate     When you had erections with sexual stimulation, how often were your erections hard enough for penetration (entering your partner)?  Sometimes (about half the time)     During sexual intercourse, how often were you able to maintain your erection after you had penetrated (entered) your partner?  Most Times (much more than half the time)     During sexual intercourse, how difficult was it to maintain your erection to completion of intercourse?  Slightly Difficult     When you attempted sexual intercourse, how often was it satisfactory for you?  Most Times (much more than half the time)       SHIM Total Score   SHIM  18         PMH: Past Medical History:  Diagnosis Date  . Arthritis    "knees and hips" (02/28/2018)  . Cancer ([place])   . Chicken pox   . GERD (gastroesophageal  reflux disease)   . Heart murmur   . Hernia, abdominal   . High cholesterol   . History of gout   . Migraine    "probably monthly" (02/28/2018)  . OSA on CPAP   . Pneumonia    "once" (02/28/2018)  . Refusal of blood transfusions as patient is Jehovah's Witness   . Seasonal allergies   . Type II diabetes mellitus ([place])     Surgical History: Past Surgical History:  Procedure Laterality Date  . CARDIAC CATHETERIZATION  02/28/2018  . LEFT HEART CATH AND CORONARY ANGIOGRAPHY N/A 02/28/2018   Procedure: LEFT HEART CATH AND CORONARY ANGIOGRAPHY;  Surgeon: Sherren Mocha, MD;  Location: [facility];  Service: Cardiovascular;  Laterality: N/A;  . WRIST GANGLION EXCISION Left 1984    Home Medications:  Allergies as of 10/02/2019      Reactions   Tape Rash   Paper tape-blisters      Medication List       Accurate as of October 02, 2019  2:30 PM. If you have any questions, ask your nurse or doctor.        Accu-Chek FastClix Lancets Misc USE TWICE A DAY AS DIRECTED     albuterol 108 (90 Base) MCG/ACT inhaler Commonly known as: VENTOLIN HFA Inhale 2 puffs into the lungs every 6 (six) hours as needed for wheezing or shortness of breath.   Alcohol Prep Pads 1 each by Does not apply route 2 (two) times daily.   amLODipine 5 MG tablet Commonly known as: NORVASC TAKE 1 TABLET (5 MG TOTAL) BY MOUTH DAILY. OFFICE VISIT NEEDED   aspirin EC 81 MG tablet Take 81 mg by mouth daily.   atorvastatin 10 MG tablet Commonly known as: LIPITOR TAKE 1 TABLET BY MOUTH DAILY.   citalopram 20 MG tablet Commonly known as: CELEXA TAKE 1 TABLET BY MOUTH DAILY.   glucose blood test strip TEST BLOOD SUGAR AS DIRECTED TWICE DAILY   hydroxypropyl methylcellulose / hypromellose 2.5 % ophthalmic solution Commonly known as: ISOPTO TEARS / GONIOVISC Place 1 drop into both eyes as needed for dry eyes.   metFORMIN 1000 MG tablet Commonly known as: GLUCOPHAGE TAKE 1 TABLET BY MOUTH TWICE A DAY WITH A MEAL   multivitamin with minerals tablet Take 1 tablet by mouth daily.   omega-3 acid ethyl esters 1 g capsule Commonly known as: LOVAZA Take 1 g by mouth daily.   PROTEIN SUPPLEMENT 80% PO Take 8 oz by mouth daily.   tamsulosin 0.4 MG Caps capsule Commonly known as: FLOMAX TAKE 1 CAPSULE (0.4 MG TOTAL) BY MOUTH DAILY.       Allergies:  Allergies  Allergen Reactions  . Tape Rash    Paper tape-blisters    Family History: Family History  Problem Relation Age of Onset  . Diabetes Mother   . Hyperlipidemia Mother   . Bladder Cancer Father   . Liver cancer Sister   . Diabetes Sister   . Diabetes Maternal Aunt   . Arthritis Maternal Grandmother   . Diabetes Maternal Grandmother   . Arthritis Maternal Grandfather   . Arthritis Paternal Grandmother   . Diabetes Brother   . Heart disease Brother   . Colon cancer Neg Hx     Social History:  reports that he quit smoking about 9 years ago. His smoking use included cigarettes. He has a 27.00 pack-year smoking  history. He quit smokeless tobacco use about 24 years ago.  His smokeless tobacco use included chew.  He reports current alcohol use of about 2.0 standard drinks of alcohol per week. He reports that he does not use drugs.  ROS: 12 point review systems negative other than as per HPI.  Physical Exam: BP (!) 156/91   Pulse 88   Ht 5\' 11"  (1.803 m)   Wt 245 lb (111.1 kg)   BMI 34.17 kg/m   Constitutional:  Alert and oriented, No acute distress.  Present today by speaker phone. HEENT: [place] AT, moist mucus membranes.  Trachea midline, no masses. Cardiovascular: No clubbing, cyanosis, or edema. Respiratory: Normal respiratory effort, no increased work of breathing. Skin: No rashes, bruises or suspicious lesions. Neurologic: Grossly intact, no focal deficits, moving all 4 extremities. Psychiatric: Normal mood and affect.  Laboratory Data: Lab Results  Component Value Date   WBC 6.4 12/20/2018   HGB 14.1 12/20/2018   HCT 42.8 12/20/2018   MCV 79.1 12/20/2018   PLT 232.0 12/20/2018    Lab Results  Component Value Date   CREATININE 1.00 10/01/2019    Lab Results  Component Value Date   PSA 5.47 (H) 08/01/2019   PSA 3.40 11/10/2017   PSA 2.41 10/26/2016    Pertinent Imaging: CT of the abdomen pelvis with contrast as well as bone scan were personally reviewed today.  Agree with radiologic interpretation.  No evidence of metastatic disease.  Assessment & Plan:    1. Prostate cancer [facility]) Newly diagnosed high risk prostate cancer, Gleason 4+4 primarily on the left without evidence of metastatic disease  The patient was counseled about the natural history of prostate cancer and the standard treatment options that are available for prostate cancer. It was explained to him how his age and life expectancy, clinical stage, Gleason score, and PSA affect his prognosis, the decision to proceed with additional staging studies, as well as how that information influences recommended treatment  strategies. We discussed the roles for active surveillance, radiation therapy, surgical therapy, androgen deprivation, as well as ablative therapy options for the treatment of prostate cancer as appropriate to his individual cancer situation. We discussed the risks and benefits of these options with regard to their impact on cancer control and also in terms of potential adverse events, complications, and impact on quality of life particularly related to urinary, bowel, and sexual function. The patient was encouraged to ask questions throughout the discussion today and all questions were answered to his stated satisfaction. In addition, the patient was provided with and/or directed to appropriate resources and literature for further education about prostate cancer treatment options.  We discussed surgical therapy for prostate cancer including the different available surgical approaches.  Specifically, we discussed robotic prostatectomy with pelvic lymph node dissection based on his risk stratification.  I would recommend nonnerve sparing on the left given the extent of disease with possible nerve sparing on the right. We discussed, in detail, the risks and expectations of surgery with regard to cancer control, urinary control, and erectile dysfunction as well as expected post operative recovery processed. Additional risks of surgery including but not limited to bleeding, infection, hernia formation, nerve damage, fistula formation, bowel/rectal injury, potentially necessitating colostomy, damage to the urinary tract resulting in urinary leakage, urethral stricture, and cardiopulmonary risk such as myocardial infarction, stroke, death, thromboembolism etc. were explained.   He is interested in discussing his options with radiation oncology.  We did discuss that in young patients with high risk disease, more aggressive surgical approach tends to be preferred which would allow for further adjuvant radiation  down the  road should he have recurrence.  We also review his MSK nomogram today at length.  Overall, he is a fairly good surgical candidate.  In addition to the above, we did directly address his unwillingness to receive blood transfusions.  He understands the risk associated with this albeit relatively low.  All questions were answered today.  - Ambulatory referral to Radiation Oncology   Return for f/u early [name] virtual visit prostate cancer.  Hollice Espy, MD  [facility] [address] [place], [place] [address] [phone]  I spent 40 min with this patient of which greater than 50% was spent in counseling and coordination of care with the patient.

## 2019-10-04 ENCOUNTER — Encounter: Payer: Self-pay | Admitting: Internal Medicine

## 2019-10-08 ENCOUNTER — Ambulatory Visit (INDEPENDENT_AMBULATORY_CARE_PROVIDER_SITE_OTHER): Payer: No Typology Code available for payment source | Admitting: Internal Medicine

## 2019-10-08 ENCOUNTER — Encounter: Payer: Self-pay | Admitting: Internal Medicine

## 2019-10-08 ENCOUNTER — Telehealth: Payer: Self-pay | Admitting: Urology

## 2019-10-08 DIAGNOSIS — C61 Malignant neoplasm of prostate: Secondary | ICD-10-CM | POA: Diagnosis not present

## 2019-10-08 DIAGNOSIS — F411 Generalized anxiety disorder: Secondary | ICD-10-CM

## 2019-10-08 DIAGNOSIS — F5104 Psychophysiologic insomnia: Secondary | ICD-10-CM | POA: Diagnosis not present

## 2019-10-08 MED ORDER — CITALOPRAM HYDROBROMIDE 40 MG PO TABS: 40.0000 mg | ORAL_TABLET | Freq: Every day | ORAL | 3 refills | Status: DC

## 2019-10-08 MED ORDER — HYDROXYZINE HCL 25 MG PO TABS: 25.0000 mg | ORAL_TABLET | Freq: Every evening | ORAL | 0 refills | Status: DC | PRN

## 2019-10-08 MED FILL — hydrOXYzine HCL 25 MG TABS: 25 | 30 days supply | Qty: 30 | Fill #0

## 2019-10-08 MED FILL — CITALOPRAM HBR 40 MG TABLET: 40 | 30 days supply | Qty: 30 | Fill #0

## 2019-10-08 NOTE — Telephone Encounter (Signed)
Spoke with patient and he would like to go ahead with surgery, he will keep apt on 1-13 with Dr.Brandon

## 2019-10-08 NOTE — Progress Notes (Signed)
Virtual Visit via Video Note  I connected with Marc Aguilar on 10/08/19 at  3:45 PM EST by a video enabled telemedicine application and verified that I am speaking with the correct person using two identifiers.  Location: Patient: Home Provider: Office   I discussed the limitations of evaluation and management by telemedicine and the availability of in person appointments. The patient expressed understanding and agreed to proceed.  History of Present Illness:  Pt wanting to discuss insomnia. He is having difficulty falling asleep and staying asleep. He feels like this is being triggered by increase in stress and anxiety, dealing with COVID, caring for his wife and brother in law, work, and recent diagnosis of prostate cancer. He is currently taking Citalopram 20 mg daily and does not feel like it is effective as it once was. He has been talking with someone at the [facility]. He will be reaching out to schedule another appt soon. He denies depression, SI/HI.    Past Medical History:  Diagnosis Date  . Arthritis    "knees and hips" (02/28/2018)  . Cancer ([place])   . Chicken pox   . GERD (gastroesophageal reflux disease)   . Heart murmur   . Hernia, abdominal   . High cholesterol   . History of gout   . Migraine    "probably monthly" (02/28/2018)  . OSA on CPAP   . Pneumonia    "once" (02/28/2018)  . Refusal of blood transfusions as patient is Jehovah's Witness   . Seasonal allergies   . Type II diabetes mellitus ([place])     Current Outpatient Medications  Medication Sig Dispense Refill  . ACCU-CHEK FASTCLIX LANCETS MISC USE TWICE A DAY AS DIRECTED 204 each 3  . albuterol (VENTOLIN HFA) 108 (90 Base) MCG/ACT inhaler Inhale 2 puffs into the lungs every 6 (six) hours as needed for wheezing or shortness of breath. 1 Inhaler 0  . Alcohol Swabs (ALCOHOL PREP) PADS 1 each by Does not apply route 2 (two) times daily. 200 each 3  . amLODipine (NORVASC) 5 MG tablet TAKE 1  TABLET (5 MG TOTAL) BY MOUTH DAILY. OFFICE VISIT NEEDED 15 tablet 0  . aspirin EC 81 MG tablet Take 81 mg by mouth daily.    Marland Kitchen atorvastatin (LIPITOR) 10 MG tablet TAKE 1 TABLET BY MOUTH DAILY. 30 tablet 1  . citalopram (CELEXA) 20 MG tablet TAKE 1 TABLET BY MOUTH DAILY. 90 tablet 0  . glucose blood test strip TEST BLOOD SUGAR AS DIRECTED TWICE DAILY 200 each 3  . hydroxypropyl methylcellulose / hypromellose (ISOPTO TEARS / GONIOVISC) 2.5 % ophthalmic solution Place 1 drop into both eyes as needed for dry eyes.    . metFORMIN (GLUCOPHAGE) 1000 MG tablet TAKE 1 TABLET BY MOUTH TWICE A DAY WITH A MEAL 180 tablet 3  . Multiple Vitamins-Minerals (MULTIVITAMIN WITH MINERALS) tablet Take 1 tablet by mouth daily.    . Nutritional Supplements (PROTEIN SUPPLEMENT 80% PO) Take 8 oz by mouth daily.    Marland Kitchen omega-3 acid ethyl esters (LOVAZA) 1 g capsule Take 1 g by mouth daily.    . tamsulosin (FLOMAX) 0.4 MG CAPS capsule TAKE 1 CAPSULE (0.4 MG TOTAL) BY MOUTH DAILY. 90 capsule 3   No current facility-administered medications for this visit.    Allergies  Allergen Reactions  . Tape Rash    Paper tape-blisters    Family History  Problem Relation Age of Onset  . Diabetes Mother   . Hyperlipidemia Mother   .  Bladder Cancer Father   . Liver cancer Sister   . Diabetes Sister   . Diabetes Maternal Aunt   . Arthritis Maternal Grandmother   . Diabetes Maternal Grandmother   . Arthritis Maternal Grandfather   . Arthritis Paternal Grandmother   . Diabetes Brother   . Heart disease Brother   . Colon cancer Neg Hx     Social History   Socioeconomic History  . Marital status: Married    Spouse name: Not on file  . Number of children: 2  . Years of education: Not on file  . Highest education level: Not on file  Occupational History  . 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 Smoking status: Former Smoker    Packs/day: 1.00    Years: 27.00    Pack years: 27.00    Types: Cigarettes    Quit date:  10/18/2009    Years since quitting: 9.9  . Smokeless tobacco: Former Systems developer    Types: [place] date: 10/18/1994  Substance and Sexual Activity  . Alcohol use: Yes    Alcohol/week: 2.0 standard drinks    Types: 2 Cans of beer per week  . Drug use: No  . Sexual activity: Not Currently  Other Topics Concern  . Not on file  Social History Narrative  . Not on file   Social Determinants of Health   Financial Resource Strain:   . Difficulty of Paying Living Expenses: Not on file  Food Insecurity:   . Worried About Charity fundraiser in the Last Year: Not on file  . Ran Out of Food in the Last Year: Not on file  Transportation Needs:   . Lack of Transportation (Medical): Not on file  . Lack of Transportation (Non-Medical): Not on file  Physical Activity:   . Days of Exercise per Week: Not on file  . Minutes of Exercise per Session: Not on file  Stress:   . Feeling of Stress : Not on file  Social Connections:   . Frequency of Communication with Friends and Family: Not on file  . Frequency of Social Gatherings with Friends and Family: Not on file  . Attends Religious Services: Not on file  . Active Member of Clubs or Organizations: Not on file  . Attends Archivist Meetings: Not on file  . Marital Status: Not on file  Intimate Partner Violence:   . Fear of Current or Ex-Partner: Not on file  . Emotionally Abused: Not on file  . Physically Abused: Not on file  . Sexually Abused: Not on file     Constitutional: Denies fever, malaise, fatigue, headache or abrupt weight changes.  Respiratory: Denies difficulty breathing, shortness of breath, cough or sputum production.   Cardiovascular: Denies chest pain, chest tightness, palpitations or swelling in the hands or feet.  Neurological: Pt reports insomnia. Denies dizziness, difficulty with memory, difficulty with speech or problems with balance and coordination.  Psych: Pt reports anxiety. Denies  depression, SI/HI.  No  other specific complaints in a complete review of systems (except as listed in HPI above).  Observations/Objective:   Wt Readings from Last 3 Encounters:  10/02/19 245 lb (111.1 kg)  09/18/19 245 lb (111.1 kg)  08/30/19 245 lb (111.1 kg)    General: Appears his stated age, obese, in NAD. Pulmonary/Chest: Normal effort. No respiratory distress.  Neurological: Alert and oriented. Psychiatric: Mood and affect mildly flat. Behavior is normal. Judgment and thought content normal.  BMET    Component Value Date/Time   NA 138 12/20/2018 1434   K 4.0 12/20/2018 1434   CL 103 12/20/2018 1434   CO2 27 12/20/2018 1434   GLUCOSE 94 12/20/2018 1434   BUN 16 12/20/2018 1434   CREATININE 1.00 10/01/2019 1200   CALCIUM 10.1 12/20/2018 1434   GFRNONAA >60 02/28/2018 1723   GFRAA >60 02/28/2018 1723    Lipid Panel     Component Value Date/Time   CHOL 188 11/14/2018 0935   TRIG 62.0 11/14/2018 0935   HDL 42.60 11/14/2018 0935   CHOLHDL 4 11/14/2018 0935   VLDL 12.4 11/14/2018 0935   LDLCALC 133 (H) 11/14/2018 0935    CBC    Component Value Date/Time   WBC 6.4 12/20/2018 1434   RBC 5.41 12/20/2018 1434   HGB 14.1 12/20/2018 1434   HCT 42.8 12/20/2018 1434   PLT 232.0 12/20/2018 1434   MCV 79.1 12/20/2018 1434   MCH 25.6 (L) 02/28/2018 1723   MCHC 33.0 12/20/2018 1434   RDW 14.9 12/20/2018 1434   LYMPHSABS 3.0 02/28/2018 0300   MONOABS 0.4 02/28/2018 0300   EOSABS 0.2 02/28/2018 0300   BASOSABS 0.0 02/28/2018 0300    Hgb A1C Lab Results  Component Value Date   HGBA1C 5.4 11/14/2018        Assessment and Plan:  GAD, Insomnia, Prostate Ca:  Situational Support offered today Increase Citalopram to 40 mg daily RX for Hydroxyzine 25 mg PO QHS Encouraged him to continue to talk with the EAP as needed He will let me know if he needs a referral for a therapist  Update me in 2-3 weeks via mychart and let me know how you are doing  Follow Up Instructions:     I discussed the assessment and treatment plan with the patient. The patient was provided an opportunity to ask questions and all were answered. The patient agreed with the plan and demonstrated an understanding of the instructions.   The patient was advised to call back or seek an in-person evaluation if the symptoms worsen or if the condition fails to improve as anticipated.     Webb Silversmith, NP

## 2019-10-08 NOTE — Telephone Encounter (Signed)
Pt called office to inform Dr. Erlene Quan he has decided not to do radiation and would like to proceed with surgery w/ Dr. Erlene Quan. Pt would like to talk to someone before his appt on 1/13. Please advise. Thank you.

## 2019-10-08 NOTE — Patient Instructions (Signed)

## 2019-10-17 ENCOUNTER — Ambulatory Visit: Payer: No Typology Code available for payment source | Admitting: Radiation Oncology

## 2019-10-19 DIAGNOSIS — C61 Malignant neoplasm of prostate: Secondary | ICD-10-CM

## 2019-10-19 HISTORY — DX: Malignant neoplasm of prostate: C61

## 2019-10-22 ENCOUNTER — Other Ambulatory Visit: Payer: Self-pay | Admitting: Radiology

## 2019-10-24 ENCOUNTER — Other Ambulatory Visit: Payer: Self-pay | Admitting: Radiology

## 2019-10-24 ENCOUNTER — Telehealth: Payer: Self-pay

## 2019-10-24 DIAGNOSIS — C61 Malignant neoplasm of prostate: Secondary | ICD-10-CM

## 2019-10-24 NOTE — Telephone Encounter (Signed)
   Primary Cardiologist:[name], MD  Chart reviewed as part of pre-operative protocol coverage. Because of Marc Aguilar's past medical history and time since last visit, he/she will require a follow-up visit in order to better assess preoperative cardiovascular risk.  Pre-op covering staff: - Please contact requesting surgeon's office via preferred method (i.e, phone, fax) to inform them of need for appointment prior to surgery. He is already scheduled for 10/26/2019 with Almyra Deforest, PA-C   Abigail Butts, PA-C  10/24/2019, 4:26 PM

## 2019-10-24 NOTE — Telephone Encounter (Signed)
   [facility] Pre-operative Risk Assessment    Request for surgical clearance:  1. What type of surgery is being performed? Robot assisted laparoscopic prostatectomy   2. When is this surgery scheduled? 11/19/19  3. What type of clearance is required (medical clearance vs. Pharmacy clearance to hold med vs. Both)? Both  4. Are there any medications that need to be held prior to surgery and how long? Aspirin hold 7 days prior to surgery  5. Practice name and name of physician performing surgery? [place]  6. What is your office phone number [phone]   7.   What is your office fax number [phone]  8.   Anesthesia type Not listed   Kathyrn Lass 10/24/2019, 3:57 PM  _________________________________________________________________   (provider comments below)

## 2019-10-24 NOTE — Telephone Encounter (Signed)
I will forward clearance notes Almyra Deforest, [facility] for appt on 10/26/19. Surgeon's office has been made aware pt being seen for 10/26/19 for pre op clearance. I will remove from the pre op call back pool.

## 2019-10-26 ENCOUNTER — Other Ambulatory Visit: Payer: Self-pay

## 2019-10-26 ENCOUNTER — Ambulatory Visit (INDEPENDENT_AMBULATORY_CARE_PROVIDER_SITE_OTHER): Payer: No Typology Code available for payment source | Admitting: Physician Assistant

## 2019-10-26 ENCOUNTER — Encounter: Payer: Self-pay | Admitting: Physician Assistant

## 2019-10-26 VITALS — BP 124/80 | HR 72 | Ht 71.0 in | Wt 244.0 lb

## 2019-10-26 DIAGNOSIS — R079 Chest pain, unspecified: Secondary | ICD-10-CM

## 2019-10-26 DIAGNOSIS — E785 Hyperlipidemia, unspecified: Secondary | ICD-10-CM | POA: Diagnosis not present

## 2019-10-26 DIAGNOSIS — Z01818 Encounter for other preprocedural examination: Secondary | ICD-10-CM | POA: Diagnosis not present

## 2019-10-26 DIAGNOSIS — E119 Type 2 diabetes mellitus without complications: Secondary | ICD-10-CM

## 2019-10-26 MED ORDER — AMLODIPINE BESYLATE 5 MG PO TABS: ORAL_TABLET | ORAL | 3 refills | Status: DC

## 2019-10-26 MED FILL — AMLODIPINE BESYLATE 5 MG TA: 5 | 90 days supply | Qty: 90 | Fill #0

## 2019-10-26 NOTE — Progress Notes (Signed)
Cardiology Office Note:    Date:  10/28/2019   ID:  [name], DOB [birth year], MRN [id number]  PCP:  [name], NP  Cardiologist:  [name], MD  Electrophysiologist:  None   Referring MD: [name], NP   Chief Complaint  Patient presents with  . Follow-up    seen for Dr. Martinique.    History of Present Illness:    Marc Aguilar is a 59 y.o. male with a hx of HLD, DM II, OSA on CPAP and BPH.  Patient was admitted on 02/28/2018 with right-sided chest pain radiating to his scapula.  He also reported a similar episode of chest pain in 2016 for which he was evaluated at [facility] and underwent nuclear stress test which showed mild reversible ischemia involving apical segment but no evidence of MI, EF was 56%.  This Myoview was later over read by cardiologist who felt that there was no signs of reversible ischemia.  Upon presenting to the ED, initial EKG showed ST elevation in V2 and V3 with ST depression in the inferior leads.  Initial troponin was negative.  He underwent cardiac catheterization on 02/28/2018 which showed widely patent coronary arteries with minimal coronary irregularities, normal EF with normal LVEDP.  Differential diagnosis include coronary spasm versus noncardiac chest pain.  Echocardiogram obtained on 03/01/2018 also showed EF 65 to 70%, mild LVH.  He was started on low-dose Norvasc.  ABI obtained on 04/04/2018 showed noncompressible arteries, however triphasic blood flow throughout.  Patient was recently seen by urology for elevated PSA level.  Prostate biopsy revealed evidence of high risk prostate cancer.  Metastatic evaluation includes CT abdomen pelvis with contrast as well as bone scan were negative for any evidence of metastatic disease.  He presents today for preoperative clearance prior to robotic assisted laparoscopic prostatectomy on 11/19/2019 by Dr. Hollice Espy of [facility].  Patient presents today for cardiology office visit and  preoperative clearance.  For the past several months, he has been noticing intermittent substernal chest discomfort.  This occurs multiple times throughout the day and only last a few minutes before going away.  There is no exacerbating factors or alleviating factors.  He noticed the symptom both at rest and with exertion.  He denies any worsening shortness of breath.  I recommended a plain old treadmill test in this case to further evaluate.  He may hold aspirin for 7 days prior to the surgery.  If treadmill test is negative, he may proceed with surgery as a low risk patient.  Given the reassuring cardiac catheterization in 2019, suspicion for high-grade coronary stenosis is quite low.   Past Medical History:  Diagnosis Date  . Arthritis    "knees and hips" (02/28/2018)  . Cancer ([name])   . Chicken pox   . GERD (gastroesophageal reflux disease)   . Heart murmur   . Hernia, abdominal   . High cholesterol   . History of gout   . Migraine    "probably monthly" (02/28/2018)  . OSA on CPAP   . Pneumonia    "once" (02/28/2018)  . Refusal of blood transfusions as patient is Jehovah's Witness   . Seasonal allergies   . Type II diabetes mellitus ([place])     Past Surgical History:  Procedure Laterality Date  . CARDIAC CATHETERIZATION  02/28/2018  . LEFT HEART CATH AND CORONARY ANGIOGRAPHY N/A 02/28/2018   Procedure: LEFT HEART CATH AND CORONARY ANGIOGRAPHY;  Surgeon: Sherren Mocha, MD;  Location: [facility]  INVASIVE CV LAB;  Service: Cardiovascular;  Laterality: N/A;  . WRIST GANGLION EXCISION Left 1984    Current Medications: Current Meds  Medication Sig  . ACCU-CHEK FASTCLIX LANCETS MISC USE TWICE A DAY AS DIRECTED  . albuterol (VENTOLIN HFA) 108 (90 Base) MCG/ACT inhaler Inhale 2 puffs into the lungs every 6 (six) hours as needed for wheezing or shortness of breath.  . Alcohol Swabs (ALCOHOL PREP) PADS 1 each by Does not apply route 2 (two) times daily.  Marland Kitchen amLODipine (NORVASC) 5 MG tablet TAKE 1  TABLET (5 MG TOTAL) BY MOUTH DAILY  . aspirin EC 81 MG tablet Take 81 mg by mouth daily.  Marland Kitchen atorvastatin (LIPITOR) 10 MG tablet TAKE 1 TABLET BY MOUTH DAILY.  . citalopram (CELEXA) 40 MG tablet Take 1 tablet (40 mg total) by mouth daily.  Marland Kitchen glucose blood test strip TEST BLOOD SUGAR AS DIRECTED TWICE DAILY  . hydroxypropyl methylcellulose / hypromellose (ISOPTO TEARS / GONIOVISC) 2.5 % ophthalmic solution Place 1 drop into both eyes as needed for dry eyes.  . hydrOXYzine (ATARAX/VISTARIL) 25 MG tablet Take 1 tablet (25 mg total) by mouth at bedtime as needed.  . metFORMIN (GLUCOPHAGE) 1000 MG tablet TAKE 1 TABLET BY MOUTH TWICE A DAY WITH A MEAL  . Multiple Vitamins-Minerals (MULTIVITAMIN WITH MINERALS) tablet Take 1 tablet by mouth daily.  . Nutritional Supplements (PROTEIN SUPPLEMENT 80% PO) Take 8 oz by mouth daily.  Marland Kitchen omega-3 acid ethyl esters (LOVAZA) 1 g capsule Take 1 g by mouth daily.  . tamsulosin (FLOMAX) 0.4 MG CAPS capsule TAKE 1 CAPSULE (0.4 MG TOTAL) BY MOUTH DAILY.  . [DISCONTINUED] amLODipine (NORVASC) 5 MG tablet TAKE 1 TABLET (5 MG TOTAL) BY MOUTH DAILY. OFFICE VISIT NEEDED     Allergies:   Tape   Social History   Socioeconomic History  . Marital status: Married    Spouse name: Not on file  . Number of children: 2  . Years of education: Not on file  . Highest education level: Not on file  Occupational History  . 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 Smoking status: Former Smoker    Packs/day: 1.00    Years: 27.00    Pack years: 27.00    Types: Cigarettes    Quit date: 10/18/2009    Years since quitting: 10.0  . Smokeless tobacco: Former Systems developer    Types: [place] date: 10/18/1994  Substance and Sexual Activity  . Alcohol use: Yes    Alcohol/week: 2.0 standard drinks    Types: 2 Cans of beer per week  . Drug use: No  . Sexual activity: Not Currently  Other Topics Concern  . Not on file  Social History Narrative  . Not on file   Social Determinants of  Health   Financial Resource Strain:   . Difficulty of Paying Living Expenses: Not on file  Food Insecurity:   . Worried About Charity fundraiser in the Last Year: Not on file  . Ran Out of Food in the Last Year: Not on file  Transportation Needs:   . Lack of Transportation (Medical): Not on file  . Lack of Transportation (Non-Medical): Not on file  Physical Activity:   . Days of Exercise per Week: Not on file  . Minutes of Exercise per Session: Not on file  Stress:   . Feeling of Stress : Not on file  Social Connections:   . Frequency of Communication with Friends and  Family: Not on file  . Frequency of Social Gatherings with Friends and Family: Not on file  . Attends Religious Services: Not on file  . Active Member of Clubs or Organizations: Not on file  . Attends Archivist Meetings: Not on file  . Marital Status: Not on file     Family History: The patient's family history includes Arthritis in his maternal grandfather, maternal grandmother, and paternal grandmother; Bladder Cancer in his father; Diabetes in his brother, maternal aunt, maternal grandmother, mother, and sister; Heart disease in his brother; Hyperlipidemia in his mother; Liver cancer in his sister. There is no history of Colon cancer.  ROS:   Please see the history of present illness.     All other systems reviewed and are negative.  EKGs/Labs/Other Studies Reviewed:    The following studies were reviewed today:  Cath 02/28/2018  The left ventricular ejection fraction is 55-65% by visual estimate.  LV end diastolic pressure is normal.  The left ventricular systolic function is normal.   1. Widely patent coronary arteries with minimal coronary irregularities 2. Normal LV systolic function with normal LVEDP  Diff dx includes coronary vasospasm versus noncardiac pain as most likely etiology of chest pain. Further management/evaluation per primary cardiology team.   EKG:  EKG is ordered today.   The ekg ordered today demonstrates normal sinus rhythm, no significant ST-T wave changes  Recent Labs: 11/14/2018: TSH 1.20 12/20/2018: ALT 13; BUN 16; Hemoglobin 14.1; Platelets 232.0; Potassium 4.0; Sodium 138 10/01/2019: Creatinine, Ser 1.00  Recent Lipid Panel    Component Value Date/Time   CHOL 157 10/26/2019 1203   TRIG 105 10/26/2019 1203   HDL 48 10/26/2019 1203   CHOLHDL 3.3 10/26/2019 1203   CHOLHDL 4 11/14/2018 0935   VLDL 12.4 11/14/2018 0935   LDLCALC 90 10/26/2019 1203   LDLDIRECT 143.5 10/25/2013 1602    Physical Exam:    VS:  BP 124/80 (BP Location: Left Arm, Patient Position: Sitting, Cuff Size: Large)   Pulse 72   Ht 5\' 11"  (1.803 m)   Wt 244 lb (110.7 kg)   SpO2 98% Comment: at rest  BMI 34.03 kg/m     Wt Readings from Last 3 Encounters:  10/26/19 244 lb (110.7 kg)  10/02/19 245 lb (111.1 kg)  09/18/19 245 lb (111.1 kg)     GEN:  Well nourished, well developed in no acute distress HEENT: Normal NECK: No JVD; No carotid bruits LYMPHATICS: No lymphadenopathy CARDIAC: RRR, no murmurs, rubs, gallops RESPIRATORY:  Clear to auscultation without rales, wheezing or rhonchi  ABDOMEN: Soft, non-tender, non-distended MUSCULOSKELETAL:  No edema; No deformity  SKIN: Warm and dry NEUROLOGIC:  Alert and oriented x 3 PSYCHIATRIC:  Normal affect   ASSESSMENT:    1. Preoperative clearance   2. Chest pain of uncertain etiology   3. Hyperlipidemia LDL goal <70   4. Controlled type 2 diabetes mellitus without complication, without long-term current use of insulin (HCC)    PLAN:    In order of problems listed above:  1. Preoperative clearance: Patient has upcoming robotic assisted radical prostatectomy for prostate cancer by Dr. Erlene Quan on 11/19/2019.  He may hold aspirin for 7 days prior to the procedure and restart as soon as possible afterward.  Given recent chest pain, I did recommend a plain old treadmill test prior to final clearance.  2. Chest pain: Occur  both at rest and with exertion.  Previous cardiac catheterization in 2019 was negative, I recommended plain old  treadmill test prior to final clearance  3. Hyperlipidemia: Continue Lipitor  4. DM2: Managed per primary care provider   Medication Adjustments/Labs and Tests Ordered: Current medicines are reviewed at length with the patient today.  Concerns regarding medicines are outlined above.  Orders Placed This Encounter  Procedures  . Lipid Profile  . Exercise Tolerance Test  . EKG 12-Lead   Meds ordered this encounter  Medications  . amLODipine (NORVASC) 5 MG tablet    Sig: TAKE 1 TABLET (5 MG TOTAL) BY MOUTH DAILY    Dispense:  90 tablet    Refill:  3    Patient Instructions  Medication Instructions:  Your physician recommends that you continue on your current medications as directed. Please refer to the Current Medication list given to you today. 1.   Amlodipine was sent in today to requested pharmacy # [id number].  *If you need a refill on your cardiac medications before your next appointment, please call your pharmacy*  Lab Work: Your physician recommends that you have  a FASTING lipid profile: Today.  If you have labs (blood work) drawn today and your tests are completely normal, you will receive your results only by: Marland Kitchen MyChart Message (if you have MyChart) OR . A paper copy in the mail If you have any lab test that is abnormal or we need to change your treatment, we will call you to review the results.  Testing/Procedures: Your physician has requested that you have an exercise tolerance test. For further information please visit [email or web address]. Please also follow instruction sheet, as given.         You are scheduled for an Exercise Stress Test on Wednesday, January 13  at 9:45   Please arrive 15 minutes prior to your appointment time for registration and insurance purposes.   The test will take approximately 45 minutes to complete.   How to prepare for your  Exercise Stress Test:    Do bring a list of your current medications with you. If not listed below, you may take your medications as normal.    Bring any medication held to your appointment as you may be required to take it once the test is complete.   Do wear comfortable closes (no dresses or overalls) and walking shoes - tennis shoes are preferred. No open toed shoes or heels.  Do not wear cologne, perfume, aftershave or lotions (deodorant is allowed).  [place], [place]   If you have questions or concerns about your appointment, you can call the Exercise Stress Lab at [phone].   If you cannot keep your appointment, please provide 24 hours notification to the Exercise Stress Lab to avoid a possible $50 charge to your account.                     Your Pre-procedure COVID-19 Testing will be done on Saturday, January 9 at 12:10 pm at [facility].  Follow the signs.  After your swab you will be given a mask to wear and instructed to go home and quarantine/no visitors until after your procedure. If you test positive you will be notified and your procedure will be cancelled.   Follow-Up: At [facility], you and your health needs are our priority.  As part of our continuing mission to provide you with exceptional heart care, we have created designated Provider Care Teams.  These Care Teams include your primary Cardiologist (physician) and Advanced Practice Providers (APPs -  Physician Assistants and  Nurse Practitioners) who all work together to provide you with the care you need, when you need it.  Your next appointment:   1 year(s)  The format for your next appointment:   Either In Person or Virtual  Provider:   You may see [name], MD or one of the following Advanced Practice Providers on your designated Care Team:    Almyra Deforest, PA-C  Fabian Sharp, [place] or   Roby Lofts, [place]  Your physician wants you to follow-up in: 1 year if  everything is ok with your treadmill.  You will receive a reminder letter in the mail two months in advance. If you don't receive a letter, please call our office to schedule the follow-up appointment.     Other Instructions You can hold Asprin 7 days prior to surgery.       Hilbert Corrigan, [place]  10/28/2019 11:32 PM    [facility]

## 2019-10-26 NOTE — Patient Instructions (Addendum)
Medication Instructions:  Your physician recommends that you continue on your current medications as directed. Please refer to the Current Medication list given to you today. 1.   Amlodipine was sent in today to requested pharmacy # [id number].  *If you need a refill on your cardiac medications before your next appointment, please call your pharmacy*  Lab Work: Your physician recommends that you have  a FASTING lipid profile: Today.  If you have labs (blood work) drawn today and your tests are completely normal, you will receive your results only by: Marland Kitchen MyChart Message (if you have MyChart) OR . A paper copy in the mail If you have any lab test that is abnormal or we need to change your treatment, we will call you to review the results.  Testing/Procedures: Your physician has requested that you have an exercise tolerance test. For further information please visit [email or web address]. Please also follow instruction sheet, as given.         You are scheduled for an Exercise Stress Test on Wednesday, January 13  at 9:45   Please arrive 15 minutes prior to your appointment time for registration and insurance purposes.   The test will take approximately 45 minutes to complete.   How to prepare for your Exercise Stress Test:    Do bring a list of your current medications with you. If not listed below, you may take your medications as normal.    Bring any medication held to your appointment as you may be required to take it once the test is complete.   Do wear comfortable closes (no dresses or overalls) and walking shoes - tennis shoes are preferred. No open toed shoes or heels.  Do not wear cologne, perfume, aftershave or lotions (deodorant is allowed).  [place], [place]   If you have questions or concerns about your appointment, you can call the Exercise Stress Lab at [phone].   If you cannot keep your appointment, please provide 24 hours notification to the  Exercise Stress Lab to avoid a possible $50 charge to your account.                     Your Pre-procedure COVID-19 Testing will be done on Saturday, January 9 at 12:10 pm at [facility].  Follow the signs.  After your swab you will be given a mask to wear and instructed to go home and quarantine/no visitors until after your procedure. If you test positive you will be notified and your procedure will be cancelled.   Follow-Up: At [facility], you and your health needs are our priority.  As part of our continuing mission to provide you with exceptional heart care, we have created designated Provider Care Teams.  These Care Teams include your primary Cardiologist (physician) and Advanced Practice Providers (APPs -  Physician Assistants and Nurse Practitioners) who all work together to provide you with the care you need, when you need it.  Your next appointment:   1 year(s)  The format for your next appointment:   Either In Person or Virtual  Provider:   You may see [name], MD or one of the following Advanced Practice Providers on your designated Care Team:    Almyra Deforest, PA-C  Fabian Sharp, [place] or   Roby Lofts, [place]  Your physician wants you to follow-up in: 1 year if everything is ok with your treadmill.  You will receive a reminder letter in the mail two months in advance.  If you don't receive a letter, please call our office to schedule the follow-up appointment.     Other Instructions You can hold Asprin 7 days prior to surgery.

## 2019-10-27 ENCOUNTER — Other Ambulatory Visit (HOSPITAL_COMMUNITY)
Admission: RE | Admit: 2019-10-27 | Discharge: 2019-10-27 | Disposition: A | Discharge status: Home / Self Care | Payer: No Typology Code available for payment source | Source: Ambulatory Visit | Attending: Physician Assistant | Admitting: Physician Assistant

## 2019-10-27 DIAGNOSIS — Z01812 Encounter for preprocedural laboratory examination: Secondary | ICD-10-CM | POA: Insufficient documentation

## 2019-10-27 DIAGNOSIS — Z20822 Contact with and (suspected) exposure to covid-19: Secondary | ICD-10-CM | POA: Diagnosis not present

## 2019-10-27 LAB — LIPID PANEL
Chol/HDL Ratio: 3.3 ratio (ref 0.0–5.0)
Cholesterol, Total: 157 mg/dL (ref 100–199)
HDL: 48 mg/dL (ref >39)
LDL Chol Calc (NIH): 90 mg/dL (ref 0–99)
Triglycerides: 105 mg/dL (ref 0–149)
VLDL Cholesterol Cal: 19 mg/dL (ref 5–40)

## 2019-10-28 ENCOUNTER — Encounter: Payer: Self-pay | Admitting: Physician Assistant

## 2019-10-28 LAB — NOVEL CORONAVIRUS, NAA (HOSP ORDER, SEND-OUT TO REF LAB; TAT 18-24 HRS): SARS-CoV-2, NAA: NOT DETECTED

## 2019-10-29 ENCOUNTER — Encounter: Payer: Self-pay | Admitting: Internal Medicine

## 2019-10-30 ENCOUNTER — Telehealth (HOSPITAL_COMMUNITY): Payer: Self-pay

## 2019-10-30 NOTE — Telephone Encounter (Signed)
Encounter complete. 

## 2019-10-31 ENCOUNTER — Ambulatory Visit (HOSPITAL_COMMUNITY)
Admission: RE | Admit: 2019-10-31 | Discharge: 2019-10-31 | Disposition: A | Discharge status: Home / Self Care | Payer: No Typology Code available for payment source | Source: Ambulatory Visit | Attending: Cardiology | Admitting: Cardiology

## 2019-10-31 ENCOUNTER — Other Ambulatory Visit: Payer: Self-pay

## 2019-10-31 ENCOUNTER — Telehealth (INDEPENDENT_AMBULATORY_CARE_PROVIDER_SITE_OTHER): Payer: No Typology Code available for payment source | Admitting: Urology

## 2019-10-31 ENCOUNTER — Other Ambulatory Visit: Payer: Self-pay | Admitting: Radiology

## 2019-10-31 DIAGNOSIS — C61 Malignant neoplasm of prostate: Secondary | ICD-10-CM

## 2019-10-31 DIAGNOSIS — R079 Chest pain, unspecified: Secondary | ICD-10-CM | POA: Diagnosis not present

## 2019-10-31 DIAGNOSIS — Z01818 Encounter for other preprocedural examination: Secondary | ICD-10-CM | POA: Diagnosis not present

## 2019-10-31 LAB — EXERCISE TOLERANCE TEST
Estimated workload: 10.9 METS
Exercise duration (min): 9 min
Exercise duration (sec): 28 s
MPHR: 164 {beats}/min
Peak HR: 166 {beats}/min
Percent HR: 101 %
Rest HR: 80 {beats}/min

## 2019-10-31 NOTE — Progress Notes (Signed)
Virtual Visit via Telephone Note  I connected with Marc Aguilar on 10/31/19 at 11:30 AM EST by telephone and verified that I am speaking with the correct person using two identifiers.  Location: Patient: Home Provider: Office   I discussed the limitations, risks, security and privacy concerns of performing an evaluation and management service by telephone and the availability of in person appointments. I also discussed with the patient that there may be a patient responsible charge related to this service. The patient expressed understanding and agreed to proceed.   History of Present Illness: 59 year old male with high risk Gleason 4+4 prostate cancer who presents today via telephone visit in preparation for his upcoming surgery.  Please see previous notes for prostate cancer detail.  Staging including CT scan and bone scan negative for evidence of metastatic disease.  He declined radiation oncology consult.  He is elected to proceed with radical prostatectomy with bilateral pelvic lymph node dissection.  Cardiology clearance is pending, he completed his treadmill stress test this morning and is awaiting results.  Per his cardiologist, he will be able to hold his aspirin 7 days prior to the surgery and resume postoperatively as deemed appropriate.  He has an upcoming appointment with physical therapy prior to surgery.  He has many questions today regarding the preoperative, intraoperative and postoperative course.  Notably, he is Jehovah's Witness.  We had a lengthy conversation today outlining that he does not desire to receive any blood products even in the setting of massive bleeding which he understands could result in morbidity or mortality.  Probability of extensive bleeding is relatively low for robotic prostatectomy surgery.   Observations/Objective: Pleasant, interactive, answering intelligent questions  Assessment and Plan:   1.  Prostate cancer, high risk -patient has  elected to pursue radical prostatectomy with pelvic lymph node dissection bilaterally.  We will plan for nonnerve sparing on the left and partial versus full nerve sparing on the right.  Per previous discussion, risk of surgery including risk of bleeding, infection, fistula, bowel injury, hernia, ileus, bladder neck contracture were all discussed at length.  The postoperative course was also reviewed again today including a for Foley catheter for at least a week.  We reviewed the NCCN nomogram today as well and he understands that there may be a role for salvage radiation if his PSA does not turn to baseline or or other adverse features.  Risk of lymph node involvement was also reviewed.  Lastly, he will refuse blood products as discussed above.    Follow Up Instructions: Schedule surgery   I discussed the assessment and treatment plan with the patient. The patient was provided an opportunity to ask questions and all were answered. The patient agreed with the plan and demonstrated an understanding of the instructions.   The patient was advised to call back or seek an in-person evaluation if the symptoms worsen or if the condition fails to improve as anticipated.  I provided 21 minutes of non-face-to-face time during this encounter.   Hollice Espy, MD

## 2019-11-01 NOTE — Telephone Encounter (Signed)
   Primary Cardiologist: [name], MD  Chart reviewed as part of pre-operative protocol coverage. Given past medical history and time since last visit, based on ACC/AHA guidelines, HEMANTH MASCIOLI would be at acceptable risk for the planned procedure without further cardiovascular testing. Recent treadmill stress test was reassuring.   I will route this recommendation to the requesting party via Epic fax function and remove from pre-op pool.  Please call with questions.  [place], [place] 11/01/2019, 1:28 PM

## 2019-11-02 ENCOUNTER — Telehealth: Payer: Self-pay

## 2019-11-02 ENCOUNTER — Telehealth: Payer: No Typology Code available for payment source | Admitting: Family

## 2019-11-02 DIAGNOSIS — S6991XA Unspecified injury of right wrist, hand and finger(s), initial encounter: Secondary | ICD-10-CM | POA: Diagnosis not present

## 2019-11-02 DIAGNOSIS — E119 Type 2 diabetes mellitus without complications: Secondary | ICD-10-CM | POA: Diagnosis not present

## 2019-11-02 MED ORDER — SULFAMETHOXAZOLE-TRIMETHOPRIM 800-160 MG PO TABS: 1.0000 | ORAL_TABLET | Freq: Two times a day (BID) | ORAL | 0 refills | Status: DC

## 2019-11-02 MED FILL — SULFAMETHOXAZOLE-TMP DS TAB: 800-160 | 7 days supply | Qty: 14 | Fill #0

## 2019-11-02 NOTE — Telephone Encounter (Addendum)
Left a voice message for the patient with the comments from the ordering provider and to give our office a call back if he has any questions.  ----- Message from Almyra Deforest, [place] sent at 11/01/2019  1:28 PM EST ----- Negative treadmill test with good exercise tolerance. He is cleared to proceed with surgery as a low risk patient. I will forward clearance to Dr. Cherrie Gauze office

## 2019-11-02 NOTE — Progress Notes (Signed)
E Visit for Cellulitis  We are sorry that you are not feeling well. Here is how we plan to help!  Based on what you shared with me it looks like you have cellulitis.  Cellulitis looks like areas of skin redness, swelling, and warmth; it develops as a result of bacteria entering under the skin. Little red spots and/or bleeding can be seen in skin, and tiny surface sacs containing fluid can occur. Fever can be present. Cellulitis is almost always on one side of a body, and the lower limbs are the most common site of involvement.   I have prescribed:  Bactrim DS 1 tablet by mouth twice a day for 7 days.  If this worsens over the next 24-48 hours you need to be seen face to face. You are a diabetic, but looks like your last A1C was very well controlled. Please keep a close eye on it and follow up with your PCP in a week or sooner if it worsens.   HOME CARE:  . Take your medications as ordered and take all of them, even if the skin irritation appears to be healing.   GET HELP RIGHT AWAY IF:  . Symptoms that don't begin to go away within 48 hours. . Severe redness persists or worsens . If the area turns color, spreads or swells. . If it blisters and opens, develops yellow-brown crust or bleeds. . You develop a fever or chills. . If the pain increases or becomes unbearable.  . Are unable to keep fluids and food down.  MAKE SURE YOU    Understand these instructions.  Will watch your condition.  Will get help right away if you are not doing well or get worse.  Thank you for choosing an e-visit. Your e-visit answers were reviewed by a board certified advanced clinical practitioner to complete your personal care plan. Depending upon the condition, your plan could have included both over the counter or prescription medications. Please review your pharmacy choice. Make sure the pharmacy is open so you can pick up prescription now. If there is a problem, you may contact your provider through  [employer] and have the prescription routed to another pharmacy. Your safety is important to [place]. If you have drug allergies check your prescription carefully.  For the next 24 hours you can use MyChart to ask questions about today's visit, request a non-urgent call back, or ask for a work or school excuse. You will get an email in the next two days asking about your experience. I hope that your e-visit has been valuable and will speed your recovery.  Approximately 5 minutes was spent documenting and reviewing patient's chart.

## 2019-11-05 ENCOUNTER — Encounter: Payer: Self-pay | Admitting: Internal Medicine

## 2019-11-06 ENCOUNTER — Other Ambulatory Visit: Payer: Self-pay

## 2019-11-06 ENCOUNTER — Ambulatory Visit: Payer: No Typology Code available for payment source | Attending: Urology | Admitting: Physical Therapy

## 2019-11-06 ENCOUNTER — Encounter: Payer: Self-pay | Admitting: Physical Therapy

## 2019-11-06 DIAGNOSIS — M6208 Separation of muscle (nontraumatic), other site: Secondary | ICD-10-CM | POA: Diagnosis present

## 2019-11-06 DIAGNOSIS — M6281 Muscle weakness (generalized): Secondary | ICD-10-CM | POA: Diagnosis present

## 2019-11-06 NOTE — Patient Instructions (Addendum)
    Open book   Deep core 1 and 2   ( handout)    ____  Work body me chanics:  pull/ push with lunge position, Bend knees  Use legs and weight shift to power the movement  Exhale on exertion     Avoid straining pelvic floor, abdominal muscles , spine  Use log rolling technique instead of getting out of bed with your neck or the sit-up     Log rolling into and out of bed   Log rolling into and out of bed If getting out of bed on R side, Bent knees, scoot hips/ shoulder to L  Raise R arm completely overhead, rolling onto armpit  Then lower bent knees to bed to get into complete side lying position  Then drop legs off bed, and push up onto R elbow/forearm, and use L hand to push onto the bed

## 2019-11-07 NOTE — Therapy (Signed)
[place] MAIN [facility] SERVICES [address] [place], [place], [address] Phone: [phone]   Fax:  [phone]  Physical Therapy Evaluation  Patient Details  Name: Marc Aguilar MRN: [id number] Date of Birth: 07/12/1961 Referring Provider (PT): Joanette Gula Date: 11/06/2019    Past Medical History:  Diagnosis Date  . Arthritis    "knees and hips" (02/28/2018)  . Cancer [facility])    prostate   . Chicken pox   . GERD (gastroesophageal reflux disease)   . Heart murmur   . Hernia, abdominal   . High cholesterol   . History of gout   . Migraine    "probably monthly" (02/28/2018)  . OSA on CPAP   . Pneumonia    "once" (02/28/2018)  . Refusal of blood transfusions as patient is Jehovah's Witness   . Seasonal allergies   . Type II diabetes mellitus ([place])     Past Surgical History:  Procedure Laterality Date  . CARDIAC CATHETERIZATION  02/28/2018  . LEFT HEART CATH AND CORONARY ANGIOGRAPHY N/A 02/28/2018   Procedure: LEFT HEART CATH AND CORONARY ANGIOGRAPHY;  Surgeon: Sherren Mocha, MD;  Location: [place] [facility];  Service: Cardiovascular;  Laterality: N/A;  . WRIST GANGLION EXCISION Left 1984    There were no vitals filed for this visit.   Subjective Assessment - 11/07/19 2127    Subjective Pt reports some leakage after finishing urination 90% of the time or more.  Pt does not need to get to the toilet a second time to complete urination.  Pt works as a Producer, television/film/video and may not be able to go to the bathroom when feeling the urge. Pt tend delays going  and then the urge become urgent. Pt has had lekaage before making it to the bathroom. Pt doe snot delay urination over the weekends when not working. Daily fluid intake: 2 L water per day, 1-2 cup of coffee, no tea, juice, 5 beers per night on the weekend. Physical exertion: pulling. lifting heavy patients off stretchers, WC. Pt used to work out in gym: 2 x a day, Cardio, weight  lifting, sit up crunches.  Pt has a hernia but had not had surgery.  Sometimes the hernia causes pain.         [facility] PT Assessment - 11/07/19 2128      Assessment   Medical Diagnosis  prostate cancer     Referring Provider (PT)  Erlene Quan     Onset Date/Surgical Date  11/19/19      Precautions   Precautions  None      Balance Screen   Has the patient fallen in the past 6 months  No      Observation/Other Assessments   Observations  ankles crossed       Coordination   Gross Motor Movements are Fluid and Coordinated  --   limited anterior/ lateral excursion     Other:   Other/ Comments  simulated work tasks: pulling/ pushing/ pt handling: poor body mechanics , limited lower extremity kinetic chain use                Objective measurements completed on examination: See above findings.      [facility] Adult PT Treatment/Exercise - 11/07/19 2128      Bed Mobility   Bed Mobility  --   head crunch     Therapeutic Activites    Therapeutic Activities  --   body mechanics to minimzie straining  ab/ pelvic floor      Neuro Re-ed    Neuro Re-ed Details   excessive cues for simulated work tasks: pulling. pushing, pulling,. deep core coordination / strengthening exercise      Manual Therapy   Manual therapy comments  abdominal fascial pulling in sidelying trunk rotation to facilitate abdominal closure                PT Short Term Goals - 11/07/19 2119      PT SHORT TERM GOAL #1   Title  Pt will demo decreased abdominal bulging to improve intraabdominal pressure for improved outcome for continence post op    Time  2    Period  Weeks    Status  New    Target Date  11/21/19      PT SHORT TERM GOAL #2   Title  Pt will demo proper body mechanics to minimize strain of abdominal/ spine/ pelvic floor    Time  1    Period  Weeks    Status  New    Target Date  11/14/19      PT SHORT TERM GOAL #3   Title  Pt will demo IND with deep core/ pelvic floor coordination,  strengthening    Time  2    Period  Weeks    Status  New    Target Date  11/21/19                Plan - 11/07/19 2101    Clinical Impression Statement  Pt is a 59  yo male who is scheduled for prostatectomy on 11/19/19. Pt was referred to Pelvic [facility] PT to train his pelvic floor mm to yield better outcomes with continence post-surgery. Pt 's clinical presentations included signs of poor intraabdominal pressure system which is associated increased risk for urinary incontinence and his report of incomplete urination: _abdominal bulging _dyscoordination/ weakness of deep core mm _lack of understanding on exercises that places less strain on the abdomen/pelvic floor _poor body mechanics with simulated work tasks related with pushing, pulling, pt handling as ED technician  Pt was provided education on etiology of Sx with anatomy, physiology explanation with images along with the benefits of customized pelvic PT Tx based on pt's medical conditions and musculoskeletal deficits.   Following Tx, pt demo'd less abdominal separation and was able to show improved proper deep core mm coordination, including diaphragmatic excursion and ribcage depression. Pt also demo'd improved lower kinetic chain co-activation with patient handling, pushing, pulling and less straining of abdominopelvic muscles with proper mechanics. Pt would benefit from skilled PT with 1 more session to ensure his diastasis recti has improved to yield better prognosis with continence post surgery.       Stability/Clinical Decision Making  Evolving/Moderate complexity    Clinical Decision Making  Moderate    Rehab Potential  Good    PT Frequency  1x / week    PT Duration  2 weeks    PT Treatment/Interventions  Balance training;Therapeutic exercise;Neuromuscular re-education;Therapeutic activities;Moist Heat;Patient/family education;Manual techniques    Consulted and Agree with Plan of Care  Patient       Patient will  benefit from skilled therapeutic intervention in order to improve the following deficits and impairments:  Impaired sensation, Difficulty walking, Decreased mobility, Decreased endurance, Decreased coordination, Decreased safety awareness, Decreased knowledge of precautions, Decreased range of motion, Decreased skin integrity  Visit Diagnosis: Diastasis recti  Muscle weakness (generalized)     Problem List Patient  Active Problem List   Diagnosis Date Noted  . Osteoarthritis 11/14/2018  . Migraines 11/14/2018  . Anxiety and depression 11/14/2018  . HTN (hypertension) 11/14/2018  . Benign prostatic hyperplasia with urinary frequency 10/26/2016  . HLD (hyperlipidemia) 10/02/2015  . OSA (obstructive sleep apnea) 03/13/2015  . Seasonal allergies 03/13/2015  . GERD (gastroesophageal reflux disease) 03/13/2015  . DM type 2 (diabetes mellitus, type 2) ([place]) 10/30/2013    Jerl Mina ,PT, DPT, E-RYT  11/07/2019, 9:32 PM  [place] MAIN [facility] SERVICES [address] [place], [place], [address] Phone: [phone]   Fax:  [phone]  Name: Marc Aguilar MRN: [id number] Date of Birth: [birth year]

## 2019-11-08 ENCOUNTER — Other Ambulatory Visit: Payer: Self-pay

## 2019-11-08 DIAGNOSIS — C61 Malignant neoplasm of prostate: Secondary | ICD-10-CM

## 2019-11-09 ENCOUNTER — Other Ambulatory Visit: Payer: Self-pay

## 2019-11-09 ENCOUNTER — Other Ambulatory Visit (INDEPENDENT_AMBULATORY_CARE_PROVIDER_SITE_OTHER): Payer: No Typology Code available for payment source

## 2019-11-09 DIAGNOSIS — C61 Malignant neoplasm of prostate: Secondary | ICD-10-CM

## 2019-11-09 LAB — MICROSCOPIC EXAMINATION
Bacteria, UA: NONE SEEN
RBC, Urine: NONE SEEN /hpf (ref 0–2)
WBC, UA: NONE SEEN /hpf (ref 0–5)

## 2019-11-09 LAB — URINALYSIS, COMPLETE
Bilirubin, UA: NEGATIVE
Glucose, UA: NEGATIVE
Ketones, UA: NEGATIVE
Leukocytes,UA: NEGATIVE
Nitrite, UA: NEGATIVE
Protein,UA: NEGATIVE
RBC, UA: NEGATIVE
Specific Gravity, UA: 1.015 (ref 1.005–1.030)
Urobilinogen, Ur: 0.2 mg/dL (ref 0.2–1.0)
pH, UA: 6 (ref 5.0–7.5)

## 2019-11-12 LAB — CULTURE, URINE COMPREHENSIVE

## 2019-11-13 ENCOUNTER — Other Ambulatory Visit: Payer: Self-pay

## 2019-11-13 ENCOUNTER — Ambulatory Visit: Payer: No Typology Code available for payment source | Admitting: Physical Therapy

## 2019-11-13 DIAGNOSIS — M6208 Separation of muscle (nontraumatic), other site: Secondary | ICD-10-CM

## 2019-11-13 DIAGNOSIS — M6281 Muscle weakness (generalized): Secondary | ICD-10-CM

## 2019-11-13 NOTE — Patient Instructions (Addendum)
   Mon Tue Wed Thurs Fri  Sat Sun   morning          midday          Night            Stretches first to optimize lengthening diaphragm and pelvic floor    _open book ( handout) 15 reps   _ childs poses rocking Toes tucked, shoulders down and back, on forearms , hands shoulder width apart 10 reps   _childs pose with hands to the R to stretch L flank 5 breaths    _childs pose with hands to L to stretch R flank 5 breaths   _______   Deep core strengthening X 3 x day  ( continue when catheter is in)   Deep core level 1  10 reps  Deep core level 2  6 min    _______  Pelvic Floor  Short holds: ( like sprinter program)    DO NOT DO WHEN CATHETER IS IN Inhale, exhale, quick pelvic floor squeeze  10 reps        Mon Tue Wed Thurs Fri  Sat Sun  9am          3pm          6pm            Perform the following pelvic floor exercises until surgery.  DO NOT DO WHEN CATHETER IS IN Resume after catheter is removed and build up gradually in number of reps   Perform 3 x day  But at separate times From the above exercises    Lying on back  Long holds: ( like marathon runner program)  Inhale, exhale, squeeze and hold pelvic floor, count aloud 3 sec  Then 2 rest breaths 7 reps    ___  Body scan for relaxation, legs propped on pillows  Once day    ___  Sit upright and feet on the ground to minimize poor posture, and optimize intraabdominal pressure system

## 2019-11-13 NOTE — Therapy (Signed)
[place] MAIN [facility] SERVICES [address] [place], [place], [address] Phone: [phone]   Fax:  [phone]  Physical Therapy Treatment / Discharge Summary   Patient Details  Name: Marc Aguilar MRN: [id number] Date of Birth: 1961-04-16 Referring Provider (PT): Erlene Quan    Encounter Date: 11/13/2019  PT End of Session - 11/13/19 1503    Visit Number  2    Number of Visits  2    Date for PT Re-Evaluation  11/13/19    PT Start Time  [id number]    PT Stop Time  1440    PT Time Calculation (min)  63 min    Activity Tolerance  Patient tolerated treatment well    Behavior During Therapy  [facility] for tasks assessed/performed       Past Medical History:  Diagnosis Date  . Arthritis    "knees and hips" (02/28/2018)  . Cancer [facility])    prostate   . Chicken pox   . GERD (gastroesophageal reflux disease)   . Heart murmur   . Hernia, abdominal   . High cholesterol   . History of gout   . Migraine    "probably monthly" (02/28/2018)  . OSA on CPAP   . Pneumonia    "once" (02/28/2018)  . Refusal of blood transfusions as patient is Jehovah's Witness   . Seasonal allergies   . Type II diabetes mellitus ([place])     Past Surgical History:  Procedure Laterality Date  . CARDIAC CATHETERIZATION  02/28/2018  . LEFT HEART CATH AND CORONARY ANGIOGRAPHY N/A 02/28/2018   Procedure: LEFT HEART CATH AND CORONARY ANGIOGRAPHY;  Surgeon: Sherren Mocha, MD;  Location: [facility];  Service: Cardiovascular;  Laterality: N/A;  . WRIST GANGLION EXCISION Left 1984    There were no vitals filed for this visit.  Subjective Assessment - 11/13/19 1341    Subjective  Pt reports using the pushing/ pulling techniques at work.         [facility] PT Assessment - 11/13/19 1401      Palpation   Spinal mobility  increased paraspinal mm tightness, limited anterior / lateral intercostals B tightness      Bed Mobility   Bed Mobility  --   good carry over                Pelvic Floor Special Questions - 11/13/19 1403    Diastasis Recti  less abdominal bulging, 1 fingers width below sternum        [facility] Adult PT Treatment/Exercise - 11/13/19 1401      Neuro Re-ed    Neuro Re-ed Details   cued for pelvic floor strengthening ( quick, endurance ) , deep core level 2 review , technique for stretches to lengthen llflank and back mm to optimize lateral excursion of diaphragm / pelvic floor      Manual Therapy   Manual therapy comments  paraspinal mm release with MWM                PT Short Term Goals - 11/13/19 1502      PT SHORT TERM GOAL #1   Title  Pt will demo decreased abdominal bulging to improve intraabdominal pressure for improved outcome for continence post op ( 1/26: 1 finger width separation)    Time  2    Period  Weeks    Status  Achieved    Target Date  11/21/19      PT SHORT TERM GOAL #  2   Title  Pt will demo proper body mechanics to minimize strain of abdominal/ spine/ pelvic floor    Time  1    Period  Weeks    Status  Achieved    Target Date  11/14/19      PT SHORT TERM GOAL #3   Title  Pt will demo IND with deep core/ pelvic floor coordination, strengthening    Time  2    Period  Weeks    Status  Achieved    Target Date  11/21/19               Plan - 11/13/19 1505    Clinical Impression Statement  Pt has achieved 3/3 goals and is ready for d/c. Pt's abdominal separation and deep core coordination improved, and he advanced to proper pelvic floor  strength training. These improvements will assist with better intraabdominal pressure system to minimize urinary leakage post -op. 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 Pt may benefit from:  _work order to limit lifting restrictions further beyond 6 weeks as a gradual strengthening routine would be  important to minimize straining abdominopelvic area given the strenuous nature of his job.     _PT order to return to Pelvic PT  after surgery to help continue with deep core/ thoracolumbar strengthening and return pt to fitness routine to maintain strength to be able to withstand the excessive  pushing, pulling, lifting patients who often times are obese he reports.   Thank you for your referral.     Stability/Clinical Decision Making  Evolving/Moderate complexity    Rehab Potential  Good    PT Frequency  1x / week    PT Duration  2 weeks    PT Treatment/Interventions  Balance training;Therapeutic exercise;Neuromuscular re-education;Therapeutic activities;Moist Heat;Patient/family education;Manual techniques    Consulted and Agree with Plan of Care  Patient       Patient will benefit from skilled therapeutic intervention in order to improve the following deficits and impairments:  Impaired sensation, Difficulty walking, Decreased mobility, Decreased endurance, Decreased coordination, Decreased safety awareness, Decreased knowledge of precautions, Decreased range of motion, Decreased skin integrity  Visit Diagnosis: Diastasis recti  Muscle weakness (generalized)     Problem List Patient Active Problem List   Diagnosis Date Noted  . Osteoarthritis 11/14/2018  . Migraines 11/14/2018  . Anxiety and depression 11/14/2018  . HTN (hypertension) 11/14/2018  . Benign prostatic hyperplasia with urinary frequency 10/26/2016  . HLD (hyperlipidemia) 10/02/2015  . OSA (obstructive sleep apnea) 03/13/2015  . Seasonal allergies 03/13/2015  . GERD (gastroesophageal reflux disease) 03/13/2015  . DM type 2 (diabetes mellitus, type 2) ([place]) 10/30/2013    Jerl Mina ,PT, DPT, E-RYT  11/13/2019, 3:08 PM  [place] MAIN [facility] SERVICES [address] [place], [place], [address] Phone: [phone]   Fax:  [phone]  Name: Marc Aguilar MRN:  [id number] Date of Birth: Feb 22, 1961

## 2019-11-15 ENCOUNTER — Other Ambulatory Visit: Payer: Self-pay

## 2019-11-15 ENCOUNTER — Encounter
Admission: RE | Admit: 2019-11-15 | Discharge: 2019-11-15 | Disposition: A | Discharge status: Home / Self Care | Payer: No Typology Code available for payment source | Source: Ambulatory Visit | Attending: Urology | Admitting: Urology

## 2019-11-15 DIAGNOSIS — C61 Malignant neoplasm of prostate: Secondary | ICD-10-CM | POA: Insufficient documentation

## 2019-11-15 DIAGNOSIS — Z20822 Contact with and (suspected) exposure to covid-19: Secondary | ICD-10-CM | POA: Diagnosis not present

## 2019-11-15 DIAGNOSIS — Z01812 Encounter for preprocedural laboratory examination: Secondary | ICD-10-CM | POA: Insufficient documentation

## 2019-11-15 HISTORY — DX: Depression, unspecified: F32.A

## 2019-11-15 HISTORY — DX: Anxiety disorder, unspecified: F41.9

## 2019-11-15 NOTE — Patient Instructions (Addendum)
Your procedure is scheduled on: 11/19/19 Report to [place]. To find out your arrival time please call [phone] between 1PM - 3PM on 11/16/19.  Remember: Instructions that are not followed completely may result in serious medical risk, up to and including death, or upon the discretion of your surgeon and anesthesiologist your surgery may need to be rescheduled.     _X__ 1. Do not eat food after midnight the night before your procedure.                 No gum chewing or hard candies. You may drink clear liquids up to 2 hours                 before you are scheduled to arrive for your surgery- DO not drink clear                 liquids within 2 hours of the start of your surgery.                 Clear Liquids include:  water, apple juice without pulp, clear carbohydrate                 drink such as Clearfast or Gatorade, Black Coffee or Tea (Do not add                 anything to coffee or tea). Diabetics water only  __X__2.  On the morning of surgery brush your teeth with toothpaste and water, you                 may rinse your mouth with mouthwash if you wish.  Do not swallow any              toothpaste of mouthwash.     _X__ 3.  No Alcohol for 24 hours before or after surgery.   _X__ 4.  Do Not Smoke or use e-cigarettes For 24 Hours Prior to Your Surgery.                 Do not use any chewable tobacco products for at least 6 hours prior to                 surgery.  ____  5.  Bring all medications with you on the day of surgery if instructed.   __X__  6.  Notify your doctor if there is any change in your medical condition      (cold, fever, infections).     Do not wear jewelry, make-up, hairpins, clips or nail polish. Do not wear lotions, powders, or perfumes.  Do not shave 48 hours prior to surgery. Men may shave face and neck. Do not bring valuables to the hospital.    [facility] is not responsible for any belongings or  valuables.  Contacts, dentures/partials or body piercings may not be worn into surgery. Bring a case for your contacts, glasses or hearing aids, a denture cup will be supplied. Leave your suitcase in the car. After surgery it may be brought to your room. For patients admitted to the hospital, discharge time is determined by your treatment team.   Patients discharged the day of surgery will not be allowed to drive home.   Please read over the following fact sheets that you were given:   MRSA Information  __X__ Take these medicines the morning of surgery with A SIP OF WATER:  1. AMLODIPINE  2. CITALOPRAM  3. TAMSULOSIN  4.  5.  6.  ____ Fleet Enema (as directed)   __X__ Use CHG Soap/SAGE wipes as directed  __X__ Use inhalers on the day of surgery  __X__ Stop metformin/Janumet/Farxiga 2 days prior to surgery    ____ Take 1/2 of usual insulin dose the night before surgery. No insulin the morning          of surgery.   ____ Stop Blood Thinners Coumadin/Plavix/Xarelto/Pleta/Pradaxa/Eliquis/Effient/Aspirin  on   Or contact your Surgeon, Cardiologist or Medical Doctor regarding  ability to stop your blood thinners  __X__ Stop Anti-inflammatories 7 days before surgery such as Advil, Ibuprofen, Motrin,  BC or Goodies Powder, Naprosyn, Naproxen, Aleve, Aspirin    __X__ Stop all herbal supplements, fish oil or vitamin E until after surgery.    ____ Bring C-Pap to the hospital.

## 2019-11-16 ENCOUNTER — Other Ambulatory Visit
Admission: RE | Admit: 2019-11-16 | Discharge: 2019-11-16 | Disposition: A | Discharge status: Home / Self Care | Payer: No Typology Code available for payment source | Source: Ambulatory Visit | Attending: Urology | Admitting: Urology

## 2019-11-16 DIAGNOSIS — Z01812 Encounter for preprocedural laboratory examination: Secondary | ICD-10-CM | POA: Diagnosis not present

## 2019-11-16 LAB — BASIC METABOLIC PANEL
Anion gap: 8 (ref 5–15)
BUN: 12 mg/dL (ref 6–20)
CO2: 25 mmol/L (ref 22–32)
Calcium: 9.2 mg/dL (ref 8.9–10.3)
Chloride: 104 mmol/L (ref 98–111)
Creatinine, Ser: 1.01 mg/dL (ref 0.61–1.24)
GFR calc Af Amer: >60 mL/min (ref >60)
GFR calc non Af Amer: >60 mL/min (ref >60)
Glucose, Bld: 149 mg/dL — ABNORMAL HIGH (ref 70–99)
Potassium: 4.4 mmol/L (ref 3.5–5.1)
Sodium: 137 mmol/L (ref 135–145)

## 2019-11-16 LAB — CBC
HCT: 45.4 % (ref 39.0–52.0)
Hemoglobin: 14.7 g/dL (ref 13.0–17.0)
MCH: 25.8 pg — ABNORMAL LOW (ref 26.0–34.0)
MCHC: 32.4 g/dL (ref 30.0–36.0)
MCV: 79.6 fL — ABNORMAL LOW (ref 80.0–100.0)
Platelets: 237 10*3/uL (ref 150–400)
RBC: 5.7 MIL/uL (ref 4.22–5.81)
RDW: 14.5 % (ref 11.5–15.5)
WBC: 4.5 10*3/uL (ref 4.0–10.5)
nRBC: 0 % (ref 0.0–0.2)

## 2019-11-16 LAB — SARS CORONAVIRUS 2 (TAT 6-24 HRS): SARS Coronavirus 2: NEGATIVE

## 2019-11-16 LAB — PROTIME-INR
INR: 0.9 (ref 0.8–1.2)
Prothrombin Time: 12.1 seconds (ref 11.4–15.2)

## 2019-11-19 ENCOUNTER — Encounter
Admission: RE | Disposition: A | Discharge status: Home / Self Care | Payer: Self-pay | Source: Home / Self Care | Attending: Urology

## 2019-11-19 ENCOUNTER — Ambulatory Visit: Payer: No Typology Code available for payment source | Admitting: Anesthesiology

## 2019-11-19 ENCOUNTER — Other Ambulatory Visit: Payer: Self-pay

## 2019-11-19 ENCOUNTER — Encounter: Payer: Self-pay | Admitting: Urology

## 2019-11-19 ENCOUNTER — Observation Stay
Admission: RE | Admit: 2019-11-19 | Discharge: 2019-11-20 | Disposition: A | Discharge status: Home / Self Care | Payer: No Typology Code available for payment source | Attending: Urology | Admitting: Urology

## 2019-11-19 DIAGNOSIS — Z7984 Long term (current) use of oral hypoglycemic drugs: Secondary | ICD-10-CM | POA: Diagnosis not present

## 2019-11-19 DIAGNOSIS — I10 Essential (primary) hypertension: Secondary | ICD-10-CM | POA: Diagnosis not present

## 2019-11-19 DIAGNOSIS — Z79899 Other long term (current) drug therapy: Secondary | ICD-10-CM | POA: Insufficient documentation

## 2019-11-19 DIAGNOSIS — F419 Anxiety disorder, unspecified: Secondary | ICD-10-CM | POA: Insufficient documentation

## 2019-11-19 DIAGNOSIS — M172 Bilateral post-traumatic osteoarthritis of knee: Secondary | ICD-10-CM | POA: Diagnosis not present

## 2019-11-19 DIAGNOSIS — G4733 Obstructive sleep apnea (adult) (pediatric): Secondary | ICD-10-CM | POA: Insufficient documentation

## 2019-11-19 DIAGNOSIS — M160 Bilateral primary osteoarthritis of hip: Secondary | ICD-10-CM | POA: Insufficient documentation

## 2019-11-19 DIAGNOSIS — Z833 Family history of diabetes mellitus: Secondary | ICD-10-CM | POA: Diagnosis not present

## 2019-11-19 DIAGNOSIS — E785 Hyperlipidemia, unspecified: Secondary | ICD-10-CM | POA: Diagnosis not present

## 2019-11-19 DIAGNOSIS — N401 Enlarged prostate with lower urinary tract symptoms: Secondary | ICD-10-CM | POA: Diagnosis not present

## 2019-11-19 DIAGNOSIS — C61 Malignant neoplasm of prostate: Principal | ICD-10-CM

## 2019-11-19 DIAGNOSIS — Z888 Allergy status to other drugs, medicaments and biological substances status: Secondary | ICD-10-CM | POA: Diagnosis not present

## 2019-11-19 DIAGNOSIS — Z8249 Family history of ischemic heart disease and other diseases of the circulatory system: Secondary | ICD-10-CM | POA: Diagnosis not present

## 2019-11-19 DIAGNOSIS — Z87891 Personal history of nicotine dependence: Secondary | ICD-10-CM | POA: Insufficient documentation

## 2019-11-19 DIAGNOSIS — Z7982 Long term (current) use of aspirin: Secondary | ICD-10-CM | POA: Diagnosis not present

## 2019-11-19 DIAGNOSIS — E119 Type 2 diabetes mellitus without complications: Secondary | ICD-10-CM | POA: Insufficient documentation

## 2019-11-19 DIAGNOSIS — Z8546 Personal history of malignant neoplasm of prostate: Secondary | ICD-10-CM | POA: Diagnosis present

## 2019-11-19 DIAGNOSIS — E7800 Pure hypercholesterolemia, unspecified: Secondary | ICD-10-CM | POA: Diagnosis not present

## 2019-11-19 DIAGNOSIS — F329 Major depressive disorder, single episode, unspecified: Secondary | ICD-10-CM | POA: Insufficient documentation

## 2019-11-19 HISTORY — PX: ROBOT ASSISTED LAPAROSCOPIC RADICAL PROSTATECTOMY: SHX5141

## 2019-11-19 LAB — GLUCOSE, CAPILLARY
Glucose-Capillary: 147 mg/dL — ABNORMAL HIGH (ref 70–99)
Glucose-Capillary: 210 mg/dL — ABNORMAL HIGH (ref 70–99)
Glucose-Capillary: 282 mg/dL — ABNORMAL HIGH (ref 70–99)
Glucose-Capillary: 205 mg/dL — ABNORMAL HIGH (ref 70–99)
Glucose-Capillary: 174 mg/dL — ABNORMAL HIGH (ref 70–99)

## 2019-11-19 LAB — HEMOGLOBIN A1C
Hgb A1c MFr Bld: 7.8 % — ABNORMAL HIGH (ref 4.8–5.6)
Mean Plasma Glucose: 177.16 mg/dL

## 2019-11-19 SURGERY — PROSTATECTOMY, RADICAL, ROBOT-ASSISTED, LAPAROSCOPIC
Anesthesia: General

## 2019-11-19 MED ORDER — FENTANYL CITRATE (PF) 100 MCG/2ML IJ SOLN
INTRAMUSCULAR | Status: AC
  Filled 2019-11-19: qty 2

## 2019-11-19 MED ORDER — ONDANSETRON HCL 4 MG/2ML IJ SOLN
INTRAMUSCULAR | Status: AC
  Filled 2019-11-19: qty 2

## 2019-11-19 MED ORDER — ATORVASTATIN CALCIUM 10 MG PO TABS
10.0000 mg | ORAL_TABLET | Freq: Every day | ORAL | Status: DC
  Administered 2019-11-19 – 2019-11-20 (×2): 10 mg via ORAL
  Filled 2019-11-19 (×2): qty 1

## 2019-11-19 MED ORDER — HYDROMORPHONE HCL 1 MG/ML IJ SOLN
INTRAMUSCULAR | Status: AC
  Administered 2019-11-19: 0.5 mg via INTRAVENOUS
  Filled 2019-11-19: qty 1

## 2019-11-19 MED ORDER — CEFAZOLIN SODIUM-DEXTROSE 2-4 GM/100ML-% IV SOLN
INTRAVENOUS | Status: AC
  Filled 2019-11-19: qty 100

## 2019-11-19 MED ORDER — DEXAMETHASONE SODIUM PHOSPHATE 10 MG/ML IJ SOLN
INTRAMUSCULAR | Status: AC
  Filled 2019-11-19: qty 1

## 2019-11-19 MED ORDER — HEPARIN SODIUM (PORCINE) 5000 UNIT/ML IJ SOLN
5000.0000 [IU] | Freq: Three times a day (TID) | INTRAMUSCULAR | Status: DC
  Administered 2019-11-20: 5000 [IU] via SUBCUTANEOUS
  Filled 2019-11-19: qty 1

## 2019-11-19 MED ORDER — ALBUTEROL SULFATE (2.5 MG/3ML) 0.083% IN NEBU: 3.0000 mL | INHALATION_SOLUTION | Freq: Four times a day (QID) | RESPIRATORY_TRACT | Status: DC | PRN

## 2019-11-19 MED ORDER — CITALOPRAM HYDROBROMIDE 20 MG PO TABS
40.0000 mg | ORAL_TABLET | Freq: Every day | ORAL | Status: DC
  Administered 2019-11-20: 40 mg via ORAL
  Filled 2019-11-19: qty 2

## 2019-11-19 MED ORDER — OXYBUTYNIN CHLORIDE 5 MG PO TABS: 5.0000 mg | ORAL_TABLET | Freq: Three times a day (TID) | ORAL | Status: DC | PRN

## 2019-11-19 MED ORDER — FENTANYL CITRATE (PF) 100 MCG/2ML IJ SOLN
25.0000 ug | INTRAMUSCULAR | Status: DC | PRN
  Administered 2019-11-19 (×5): 25 ug via INTRAVENOUS

## 2019-11-19 MED ORDER — FENTANYL CITRATE (PF) 100 MCG/2ML IJ SOLN
INTRAMUSCULAR | Status: DC | PRN
  Administered 2019-11-19 (×2): 50 ug via INTRAVENOUS

## 2019-11-19 MED ORDER — MIDAZOLAM HCL 2 MG/2ML IJ SOLN
INTRAMUSCULAR | Status: DC | PRN
  Administered 2019-11-19: 2 mg via INTRAVENOUS

## 2019-11-19 MED ORDER — OXYCODONE-ACETAMINOPHEN 5-325 MG PO TABS
1.0000 | ORAL_TABLET | ORAL | Status: DC | PRN
  Administered 2019-11-19 – 2019-11-20 (×3): 2 via ORAL
  Filled 2019-11-19 (×3): qty 2

## 2019-11-19 MED ORDER — FAMOTIDINE 20 MG PO TABS: 20.0000 mg | ORAL_TABLET | Freq: Once | ORAL | Status: AC

## 2019-11-19 MED ORDER — AMLODIPINE BESYLATE 5 MG PO TABS
5.0000 mg | ORAL_TABLET | Freq: Every day | ORAL | Status: DC
  Administered 2019-11-20: 5 mg via ORAL
  Filled 2019-11-19: qty 1

## 2019-11-19 MED ORDER — ACETAMINOPHEN 325 MG PO TABS: 650.0000 mg | ORAL_TABLET | ORAL | Status: DC | PRN

## 2019-11-19 MED ORDER — PHENYLEPHRINE HCL (PRESSORS) 10 MG/ML IV SOLN
INTRAVENOUS | Status: DC | PRN
  Administered 2019-11-19: 100 ug via INTRAVENOUS

## 2019-11-19 MED ORDER — SODIUM CHLORIDE 0.9 % IV SOLN: INTRAVENOUS | Status: DC

## 2019-11-19 MED ORDER — FAMOTIDINE 20 MG PO TABS
ORAL_TABLET | ORAL | Status: AC
  Administered 2019-11-19: 20 mg via ORAL
  Filled 2019-11-19: qty 1

## 2019-11-19 MED ORDER — DIPHENHYDRAMINE HCL 50 MG/ML IJ SOLN: 12.5000 mg | Freq: Four times a day (QID) | INTRAMUSCULAR | Status: DC | PRN

## 2019-11-19 MED ORDER — ONDANSETRON HCL 4 MG/2ML IJ SOLN: 4.0000 mg | INTRAMUSCULAR | Status: DC | PRN

## 2019-11-19 MED ORDER — PROPOFOL 10 MG/ML IV BOLUS
INTRAVENOUS | Status: DC | PRN
  Administered 2019-11-19: 180 mg via INTRAVENOUS

## 2019-11-19 MED ORDER — DEXAMETHASONE SODIUM PHOSPHATE 10 MG/ML IJ SOLN
INTRAMUSCULAR | Status: DC | PRN
  Administered 2019-11-19: 10 mg via INTRAVENOUS

## 2019-11-19 MED ORDER — HYDROMORPHONE HCL 1 MG/ML IJ SOLN
INTRAMUSCULAR | Status: DC | PRN
  Administered 2019-11-19: .2 mg via INTRAVENOUS
  Administered 2019-11-19 (×2): .3 mg via INTRAVENOUS

## 2019-11-19 MED ORDER — INSULIN ASPART 100 UNIT/ML ~~LOC~~ SOLN
4.0000 [IU] | Freq: Three times a day (TID) | SUBCUTANEOUS | Status: DC
  Administered 2019-11-19 – 2019-11-20 (×3): 4 [IU] via SUBCUTANEOUS
  Filled 2019-11-19 (×3): qty 1

## 2019-11-19 MED ORDER — HYDROMORPHONE HCL 1 MG/ML IJ SOLN
0.5000 mg | INTRAMUSCULAR | Status: AC | PRN
  Administered 2019-11-19 (×2): 0.5 mg via INTRAVENOUS

## 2019-11-19 MED ORDER — THROMBIN 5000 UNITS EX SOLR
CUTANEOUS | Status: DC | PRN
  Administered 2019-11-19: 5000 [IU] via TOPICAL

## 2019-11-19 MED ORDER — CHLORHEXIDINE GLUCONATE CLOTH 2 % EX PADS: 6.0000 | MEDICATED_PAD | Freq: Every day | CUTANEOUS | Status: DC

## 2019-11-19 MED ORDER — MIDAZOLAM HCL 2 MG/2ML IJ SOLN
INTRAMUSCULAR | Status: AC
  Filled 2019-11-19: qty 2

## 2019-11-19 MED ORDER — PROPOFOL 10 MG/ML IV BOLUS
INTRAVENOUS | Status: AC
  Filled 2019-11-19: qty 40

## 2019-11-19 MED ORDER — DIPHENHYDRAMINE HCL 12.5 MG/5ML PO ELIX
12.5000 mg | ORAL_SOLUTION | Freq: Four times a day (QID) | ORAL | Status: DC | PRN
  Filled 2019-11-19: qty 5

## 2019-11-19 MED ORDER — INSULIN ASPART 100 UNIT/ML ~~LOC~~ SOLN: 0.0000 [IU] | Freq: Every day | SUBCUTANEOUS | Status: DC

## 2019-11-19 MED ORDER — ONDANSETRON HCL 4 MG/2ML IJ SOLN
INTRAMUSCULAR | Status: DC | PRN
  Administered 2019-11-19: 4 mg via INTRAVENOUS

## 2019-11-19 MED ORDER — POLYVINYL ALCOHOL 1.4 % OP SOLN
1.0000 [drp] | OPHTHALMIC | Status: DC | PRN
  Filled 2019-11-19: qty 15

## 2019-11-19 MED ORDER — ONDANSETRON HCL 4 MG/2ML IJ SOLN: 4.0000 mg | Freq: Once | INTRAMUSCULAR | Status: DC | PRN

## 2019-11-19 MED ORDER — ROCURONIUM BROMIDE 50 MG/5ML IV SOLN
INTRAVENOUS | Status: AC
  Filled 2019-11-19: qty 2

## 2019-11-19 MED ORDER — LIDOCAINE HCL (CARDIAC) PF 100 MG/5ML IV SOSY
PREFILLED_SYRINGE | INTRAVENOUS | Status: DC | PRN
  Administered 2019-11-19: 80 mg via INTRAVENOUS

## 2019-11-19 MED ORDER — THROMBIN 5000 UNITS EX SOLR
CUTANEOUS | Status: AC
  Filled 2019-11-19: qty 5000

## 2019-11-19 MED ORDER — BELLADONNA ALKALOIDS-OPIUM 16.2-60 MG RE SUPP: 1.0000 | Freq: Four times a day (QID) | RECTAL | Status: DC | PRN

## 2019-11-19 MED ORDER — FENTANYL CITRATE (PF) 100 MCG/2ML IJ SOLN
INTRAMUSCULAR | Status: AC
  Administered 2019-11-19: 25 ug via INTRAVENOUS
  Filled 2019-11-19: qty 2

## 2019-11-19 MED ORDER — MORPHINE SULFATE (PF) 2 MG/ML IV SOLN
2.0000 mg | INTRAVENOUS | Status: DC | PRN
  Administered 2019-11-19: 2 mg via INTRAVENOUS
  Filled 2019-11-19: qty 1

## 2019-11-19 MED ORDER — CEFAZOLIN SODIUM-DEXTROSE 2-4 GM/100ML-% IV SOLN
2.0000 g | INTRAVENOUS | Status: AC
  Administered 2019-11-19: 2 g via INTRAVENOUS

## 2019-11-19 MED ORDER — CEFAZOLIN SODIUM-DEXTROSE 1-4 GM/50ML-% IV SOLN
1.0000 g | Freq: Three times a day (TID) | INTRAVENOUS | Status: AC
  Administered 2019-11-19 – 2019-11-20 (×2): 1 g via INTRAVENOUS
  Filled 2019-11-19 (×2): qty 50

## 2019-11-19 MED ORDER — HYDROMORPHONE HCL 1 MG/ML IJ SOLN
INTRAMUSCULAR | Status: AC
  Filled 2019-11-19: qty 1

## 2019-11-19 MED ORDER — ROCURONIUM BROMIDE 100 MG/10ML IV SOLN
INTRAVENOUS | Status: DC | PRN
  Administered 2019-11-19: 30 mg via INTRAVENOUS
  Administered 2019-11-19: 20 mg via INTRAVENOUS
  Administered 2019-11-19: 50 mg via INTRAVENOUS

## 2019-11-19 MED ORDER — SUGAMMADEX SODIUM 200 MG/2ML IV SOLN
INTRAVENOUS | Status: DC | PRN
  Administered 2019-11-19: 200 mg via INTRAVENOUS

## 2019-11-19 MED ORDER — BUPIVACAINE HCL 0.5 % IJ SOLN
INTRAMUSCULAR | Status: DC | PRN
  Administered 2019-11-19: 20 mL

## 2019-11-19 MED ORDER — DOCUSATE SODIUM 100 MG PO CAPS
100.0000 mg | ORAL_CAPSULE | Freq: Two times a day (BID) | ORAL | Status: DC
  Administered 2019-11-19 – 2019-11-20 (×2): 100 mg via ORAL
  Filled 2019-11-19 (×2): qty 1

## 2019-11-19 MED ORDER — INSULIN ASPART 100 UNIT/ML ~~LOC~~ SOLN
0.0000 [IU] | Freq: Three times a day (TID) | SUBCUTANEOUS | Status: DC
  Administered 2019-11-19: 5 [IU] via SUBCUTANEOUS
  Administered 2019-11-20 (×2): 2 [IU] via SUBCUTANEOUS
  Filled 2019-11-19 (×3): qty 1

## 2019-11-19 MED ORDER — SEVOFLURANE IN SOLN
RESPIRATORY_TRACT | Status: AC
  Filled 2019-11-19: qty 250

## 2019-11-19 MED ORDER — BUPIVACAINE HCL (PF) 0.5 % IJ SOLN
INTRAMUSCULAR | Status: AC
  Filled 2019-11-19: qty 30

## 2019-11-19 MED ORDER — ASPIRIN EC 81 MG PO TBEC
81.0000 mg | DELAYED_RELEASE_TABLET | Freq: Every day | ORAL | Status: DC
  Administered 2019-11-19 – 2019-11-20 (×2): 81 mg via ORAL
  Filled 2019-11-19 (×2): qty 1

## 2019-11-19 SURGICAL SUPPLY — 100 items
ANCHOR TIS RET SYS 235ML (MISCELLANEOUS) ×2 IMPLANT
APPLICATOR SURGIFLO ENDO (HEMOSTASIS) ×2 IMPLANT
APPLIER CLIP LOGIC TI 5 (MISCELLANEOUS) IMPLANT
BAG URINE DRAIN 2000ML AR STRL (UROLOGICAL SUPPLIES) IMPLANT
BLADE CLIPPER SURG (BLADE) IMPLANT
BULB RESERV EVAC DRAIN JP 100C (MISCELLANEOUS) IMPLANT
CANISTER SUCT 1200ML W/VALVE (MISCELLANEOUS) ×2 IMPLANT
CATH DRAINAGE MALECOT 26FR (CATHETERS) ×1 IMPLANT
CATH FOL 2WAY LX 18X5 (CATHETERS) ×4 IMPLANT
CATH MALECOT (CATHETERS) ×2
CHLORAPREP W/TINT 26 (MISCELLANEOUS) ×4 IMPLANT
CLIP SUT LAPRA TY ABSORB (SUTURE) IMPLANT
CLIP VESOLOCK LG 6/CT PURPLE (CLIP) ×12 IMPLANT
CORD BIP STRL DISP 12FT (MISCELLANEOUS) ×2 IMPLANT
CORD MONOPOLAR M/FML 12FT (MISCELLANEOUS) IMPLANT
COVER TIP SHEARS 8 DVNC (MISCELLANEOUS) ×1 IMPLANT
COVER TIP SHEARS 8MM DA VINCI (MISCELLANEOUS) ×1
COVER WAND RF STERILE (DRAPES) ×2 IMPLANT
DEFOGGER SCOPE WARMER CLEARIFY (MISCELLANEOUS) ×2 IMPLANT
DERMABOND ADVANCED (GAUZE/BANDAGES/DRESSINGS) ×1
DERMABOND ADVANCED .7 DNX12 (GAUZE/BANDAGES/DRESSINGS) ×1 IMPLANT
DRAIN CHANNEL JP 15F RND 16 (MISCELLANEOUS) IMPLANT
DRAIN CHANNEL JP 19F (MISCELLANEOUS) IMPLANT
DRAPE 3/4 80X56 (DRAPES) ×2 IMPLANT
DRAPE COLUMN DVNC XI (DISPOSABLE) ×1 IMPLANT
DRAPE DA VINCI XI COLUMN (DISPOSABLE) ×1
DRAPE LEGGINS SURG 28X43 STRL (DRAPES) ×2 IMPLANT
DRAPE SURG 17X11 SM STRL (DRAPES) ×8 IMPLANT
DRAPE UNDER BUTTOCK W/FLU (DRAPES) ×2 IMPLANT
DRIVER LRG NEEDLE DA VINCI (INSTRUMENTS)
DRIVER NDLE LRG DVNC (INSTRUMENTS) IMPLANT
DRSG TELFA 3X8 NADH (GAUZE/BANDAGES/DRESSINGS) ×2 IMPLANT
ELECT REM PT RETURN 9FT ADLT (ELECTROSURGICAL) ×2
ELECTRODE REM PT RTRN 9FT ADLT (ELECTROSURGICAL) ×1 IMPLANT
FORCEPS MARYLAND BIPOLAR 8X55 (INSTRUMENTS)
FORCEPS MRYLND BPLR 8X55 DVNC (INSTRUMENTS) IMPLANT
GLOVE BIO SURGEON STRL SZ 6.5 (GLOVE) ×12 IMPLANT
GLOVE BIOGEL PI IND STRL 7.5 (GLOVE) ×1 IMPLANT
GLOVE BIOGEL PI INDICATOR 7.5 (GLOVE) ×1
GOWN STRL REUS W/ TWL LRG LVL3 (GOWN DISPOSABLE) ×5 IMPLANT
GOWN STRL REUS W/ TWL XL LVL3 (GOWN DISPOSABLE) ×1 IMPLANT
GOWN STRL REUS W/TWL LRG LVL3 (GOWN DISPOSABLE) ×5
GOWN STRL REUS W/TWL XL LVL3 (GOWN DISPOSABLE) ×1
GRASPER SUT TROCAR 14GX15 (MISCELLANEOUS) ×2 IMPLANT
HEMOSTAT SURGICEL 2X14 (HEMOSTASIS) ×2 IMPLANT
HOLDER FOLEY CATH W/STRAP (MISCELLANEOUS) ×2 IMPLANT
IRRIGATION STRYKERFLOW (MISCELLANEOUS) ×1 IMPLANT
IRRIGATOR STRYKERFLOW (MISCELLANEOUS) ×2
IV LACTATED RINGERS 1000ML (IV SOLUTION) ×2 IMPLANT
KIT PINK PAD W/HEAD ARE REST (MISCELLANEOUS) ×2
KIT PINK PAD W/HEAD ARM REST (MISCELLANEOUS) ×1 IMPLANT
LABEL OR SOLS (LABEL) ×2 IMPLANT
MARKER SKIN DUAL TIP RULER LAB (MISCELLANEOUS) ×2 IMPLANT
NEEDLE HYPO 22GX1.5 SAFETY (NEEDLE) ×2 IMPLANT
NEEDLE INSUFFLATION 14GA 120MM (NEEDLE) ×2 IMPLANT
NS IRRIG 500ML POUR BTL (IV SOLUTION) ×2 IMPLANT
OBTURATOR OPTICAL STANDARD 8MM (TROCAR) ×1
OBTURATOR OPTICAL STND 8 DVNC (TROCAR) ×1
OBTURATOR OPTICALSTD 8 DVNC (TROCAR) ×1 IMPLANT
PACK LAP CHOLECYSTECTOMY (MISCELLANEOUS) ×2 IMPLANT
PENCIL ELECTRO HAND CTR (MISCELLANEOUS) ×2 IMPLANT
PROGRASP ENDOWRIST DA VINCI (INSTRUMENTS)
PROGRASP ENDOWRIST DVNC (INSTRUMENTS) IMPLANT
RELOAD STAPLER WHITE 60MM (STAPLE) ×1 IMPLANT
SEAL CANN UNIV 5-8 DVNC XI (MISCELLANEOUS) ×4 IMPLANT
SEAL XI 5MM-8MM UNIVERSAL (MISCELLANEOUS) ×4
SET TUBE SMOKE EVAC HIGH FLOW (TUBING) ×2 IMPLANT
SLEEVE ENDOPATH XCEL 5M (ENDOMECHANICALS) ×4 IMPLANT
SOLUTION ELECTROLUBE (MISCELLANEOUS) ×2 IMPLANT
SPOGE SURGIFLO 8M (HEMOSTASIS) ×1
SPONGE LAP 4X18 RFD (DISPOSABLE) ×2 IMPLANT
SPONGE SURGIFLO 8M (HEMOSTASIS) ×1 IMPLANT
SPONGE VERSALON 4X4 4PLY (MISCELLANEOUS) IMPLANT
STAPLE ECHEON FLEX 60 POW ENDO (STAPLE) ×2 IMPLANT
STAPLER RELOAD WHITE 60MM (STAPLE) ×2
STAPLER SKIN PROX 35W (STAPLE) ×2 IMPLANT
STRAP SAFETY 5IN WIDE (MISCELLANEOUS) ×2 IMPLANT
SURGILUBE 2OZ TUBE FLIPTOP (MISCELLANEOUS) ×2 IMPLANT
SUT DVC VLOC 90 3-0 CV23 UNDY (SUTURE) ×2 IMPLANT
SUT DVC VLOC 90 3-0 CV23 VLT (SUTURE) ×2
SUT ETHILON 3-0 FS-10 30 BLK (SUTURE)
SUT MNCRL 4-0 (SUTURE) ×2
SUT MNCRL 4-0 27XMFL (SUTURE) ×2
SUT PROLENE 5 0 RB 1 DA (SUTURE) IMPLANT
SUT SILK 2 0 SH (SUTURE) ×2 IMPLANT
SUT VIC AB 0 CT1 36 (SUTURE) ×2 IMPLANT
SUT VIC AB 2-0 CT1 (SUTURE) ×2 IMPLANT
SUT VIC AB 2-0 SH 27 (SUTURE) ×1
SUT VIC AB 2-0 SH 27XBRD (SUTURE) ×1 IMPLANT
SUT VICRYL 0 AB UR-6 (SUTURE) ×4 IMPLANT
SUTURE DVC VLC 90 3-0 CV23 VLT (SUTURE) ×1 IMPLANT
SUTURE EHLN 3-0 FS-10 30 BLK (SUTURE) IMPLANT
SUTURE MNCRL 4-0 27XMF (SUTURE) ×2 IMPLANT
SYR 10ML LL (SYRINGE) ×2 IMPLANT
SYR BULB IRRIG 60ML STRL (SYRINGE) ×2 IMPLANT
SYRINGE IRR TOOMEY STRL 70CC (SYRINGE) ×2 IMPLANT
TAPE CLOTH 3X10 WHT NS LF (GAUZE/BANDAGES/DRESSINGS) ×4 IMPLANT
TROCAR ENDOPATH XCEL 12X100 BL (ENDOMECHANICALS) ×4 IMPLANT
TROCAR XCEL 12X100 BLDLESS (ENDOMECHANICALS) ×2 IMPLANT
TROCAR XCEL NON-BLD 5MMX100MML (ENDOMECHANICALS) ×2 IMPLANT

## 2019-11-19 NOTE — Anesthesia Postprocedure Evaluation (Signed)
Anesthesia Post Note  Patient: Marc Aguilar  Procedure(s) Performed: XI ROBOTIC ASSISTED LAPAROSCOPIC RADICAL PROSTATECTOMY WITH LYMPH NODE DISSECTION (N/A )  Patient location during evaluation: PACU Anesthesia Type: General Level of consciousness: awake and alert Pain management: pain level controlled Vital Signs Assessment: post-procedure vital signs reviewed and stable Respiratory status: spontaneous breathing, nonlabored ventilation, respiratory function stable and patient connected to nasal cannula oxygen Cardiovascular status: blood pressure returned to baseline and stable Postop Assessment: no apparent nausea or vomiting Anesthetic complications: no     Last Vitals:  Vitals:   11/19/19 1418 11/19/19 1433  BP: 131/73 128/84  Pulse: (!) 101 100  Resp: (!) 23 19  Temp:    SpO2: 98% 93%    Last Pain:  Vitals:   11/19/19 1443  TempSrc:   PainSc: 5                  Martha Clan

## 2019-11-19 NOTE — Progress Notes (Signed)
Blood sugar 210 no new orders from dr Rosey Bath

## 2019-11-19 NOTE — Op Note (Signed)
11/19/19  PREOPERATIVE DIAGNOSIS: Prostate cancer.  POSTOPERATIVE DIAGNOSIS: Prostate cancer.  OPERATION PERFORMED: 1. DaVinci laparoscopic radical prostatectomy (nonnerve sparing on left, partial nerve sparing) 2 DaVinci laproscopic bilateral pelvic lymph node dissection.  SURGEON: Hollice Espy, MD  ASSISTANTS: Nickolas Madrid, MD  ANESTHESIA: General.  EBL: 100 cc  SPECIMEN: Prostate with bilateral seminal vesicals, bilateral pelvic lymph nodes, anterior fat pad.  FINDINGS: Accessory Pudendal Vessel: none  INDICATION: Pt.is a 59 year old male with Gleason 4+4 prostate cancer. Treatment options were discussed with him at length and he chose DaVinci radical prostatectomy.  Given high risk disease on left, we will plan on neuro nerve sparing and partial nerve sparing on right Bilateral pelvic lymph node dissection was planned due to his risk stratification.  PROCEDURE IN DETAIL: Patient was given Ancef preoperatively. He had sequential compression devices applied preoperatively for DVT prophylaxis. He was taken to the operating room where he was induced with general anesthesia. After adequate anesthesia, he was placed in the dorsal lithotomy position. His arms were draped by his side and was appropriately padded and secured to the operating room table. He was placed in the Trendelenburg position.  He was prepped and draped in sterile fashion. An 39 French Foley was placed in the bladder and instilled with 15 cc sterile water. Orogastric tube was placed. The Veress needle was passed just above the umbilicus and the abdomen was insufflated to 15 atmospheres. A 8 mm, blunt-tip trocar was placed just above the umbilicus. The zero-degree camera was passed within this and the following trocars were placed under direct vision; 8 mm robotic trocars were placed 9 cm laterally and inferiorly to the initially placed umbilical trocar. A third one was placed 7 cm lateral to  the left-sided trocar. In the corresponding position on the right side, a 12 mm trocar was placed, and then a 5 mm trocar was placed to the right and well above the umbilicus.  The robot was then docked with the robot trocar. I used the zero-degree camera. I had the hot scissors in the right hand and the left hand with the [place] bipolar and far left hand the Prograsp forceps. Initially I divided the median umbilical ligament bilaterally and the urachus and developed the space of Retzius down to pubic bone. I divided the parietal peritoneum laterally up to the vas deferens on each side. I used the Cardier forceps to provide cranial traction on the urachus. I cleaned off the Endopelvic fascia on each side and then divided it with the scissors laterally to the perirectal fat and medially to the puboprostatic ligaments which were divided. I then ligated the dorsal vein complex using a 60 mm vascular load stapler.   I then addressed the bladder neck with a 30-degree down lens. I identified the bladder neck by pulling on the Foley catheter. I divided the anterior bladder neck musculature until I then found the anterior bladder neck mucosa which was incised. I identified the Foley catheter within, deflated the balloon, pulled the Foley out through this opening and then using the Carter-Thomason needle with a #0-Vicryl suture, passed through The suprapubic region and pulled the suture through the eye of the Foley and then back out. This allowed me to provide upward traction on the prostate. I then divided the lateral bladder neck mucosa and the posterior bladder neck mucosa. I was well away from ureteral orifices. I divided the posterior bladder neck musculature until I identified the vas deferens. They were freed proximally, then divided. I freed  up the seminal vesicals using blunt and sharp dissection. .   I then went back to the 0-degree lens. I divided the Denonvilliers fascia  beneath the prostate and developed the prostate off the rectum.  On the left, no nerve sparing was performed on the right, partial nerve sparing and intracapsular plane I then isolated the pedicles of the prostate and placed weck clips on the pedicles of the prostate and then divided it with cold scissors. I continued to divide the neurovascular bundles off the prostate out to the apex of the prostate.  More of this tissue was spared on the right.  At this point the prostate was freed up except for the urethra. I addressed the prostate anteriorly, divided the dorsal vein , then the anterior urethral wall, pulled the Foley catheter back and then divided the posterior urethral wall. Specimen was completely freed up. I placed the prostate in an Endo catch bag and then placed the bag in the upper abdomen out of the way. I then irrigated the pelvis. The rectal test was negative. There was reasonable hemostasis.  I then did the pelvic lymph node dissection by incising the fascia overlying the right external iliac vein, dissecting distally. I went just distal to the node of [place] where we placed clips and then divided the lymphatics. The lateral aspect of the dissection was the pelvic side wall, inferior was the obturator nerve and proximal the hypogastric vessels. I placed clips at the proximal aspect and then divided the lymphatics. This was removed with the spoon grasper and sent to pathology.   I then did the left obturator lymph node dissection in the same fashion as the left side.  With good hemostasis, I then did the posterior reconstruction. I used a 3-0 VLoc suture on an RB1 through the cut edge of Denonvilliers fascia beneath the bladder on the right side and through the posterior striated sphincter underneath the urethra. This brought the bladder neck and urethra and closer proximity to help facilitate anastomosis.   I then did the urethral vesicle anastomosis again with two  3-0 VLoc sutures on an RB1 needle interlocked. I passed both ends of the suture from the outside-in through the bladder neck at the 6 o'clock position. I passed both through the urethral stump from the inside-out in the corresponding position. I reapproximated the bladder neck to the urethra. I then ran the Left suture on the left side anastomosis to the 9 o'clock position. Then I went back to the right sided suture and ran that up the right side to the 12 o'clock position. I then continued the left suture to the 12 o'clock position.The suture was then suspended anteriorly behind the pubic bone.   I then placed a new 52 French Foley into the bladder and filled it with 10 cc sterile water. I irrigated the bladder with 160 cc. There was no leakage. There was reasonable hemostasis.  Surgicel was used on either side of the pedicles for an additional hemostasis.  The instruments were then removed. The robot was undocked and all the trocars were removed under direct vision. There was good hemostasis. I then enlarged the umbilical trocar site large enough to remove the prostate and I closed the fascia here with #0-0 Vicryl suture in a running fashion. All the port sites were irrigated. Lidocaine was injected into all the trocar sites. The skin was closed with 4-0 Monocryl in running subcuticular fashion. Dermabond was applied.   At this point patient was awakened  and extubated in the operating room and taken to the recovery room in stable condition. There were no complications. All counts correct.  Hollice Espy, MD

## 2019-11-19 NOTE — H&P (Signed)
11/19/19  History of Present Illness:  59 year old male with high risk Gleason 4+4 prostate cancer who presents today for surgery.  Please see previous notes for prostate cancer detail.  Staging including CT scan and bone scan negative for evidence of metastatic disease.  He declined radiation oncology consult.  He is elected to proceed with radical prostatectomy with bilateral pelvic lymph node dissection.  Cardiology clearance is pending, he completed his treadmill stress test this morning and is awaiting results.  Per his cardiologist, he will be able to hold his aspirin 7 days prior to the surgery and resume postoperatively as deemed appropriate.  He has an upcoming appointment with physical therapy prior to surgery.  He has many questions today regarding the preoperative, intraoperative and postoperative course.  Notably, he is Jehov[employer]'s Witness.  We had a lengthy conversation today outlining that he does not desire to receive any blood products even in the setting of massive bleeding which he understands could result in morbidity or mortality.  Probability of extensive bleeding is relatively low for robotic prostatectomy surgery.   Past Medical History:  Diagnosis Date  . Anxiety   . Arthritis    "knees and hips" (02/28/2018)  . Cancer [facility])    prostate   . Chicken pox   . Depression   . GERD (gastroesophageal reflux disease)   . Heart murmur   . Hernia, abdominal   . High cholesterol   . History of gout   . Migraine    "probably monthly" (02/28/2018)  . OSA on CPAP   . Pneumonia    "once" (02/28/2018)  . Refusal of blood transfusions as patient is Jehovah's Witness   . Seasonal allergies   . Type II diabetes mellitus ([place])     Past Surgical History:  Procedure Laterality Date  . CARDIAC CATHETERIZATION  02/28/2018  . LEFT HEART CATH AND CORONARY ANGIOGRAPHY N/A 02/28/2018   Procedure: LEFT HEART CATH AND CORONARY ANGIOGRAPHY;  Surgeon: Sherren Mocha,  MD;  Location: [facility];  Service: Cardiovascular;  Laterality: N/A;  . WRIST GANGLION EXCISION Left 1984    Home Medications:  Current Meds  Medication Sig  . albuterol (VENTOLIN HFA) 108 (90 Base) MCG/ACT inhaler Inhale 2 puffs into the lungs every 6 (six) hours as needed for wheezing or shortness of breath.  Marland Kitchen amLODipine (NORVASC) 5 MG tablet TAKE 1 TABLET (5 MG TOTAL) BY MOUTH DAILY (Patient taking differently: Take 5 mg by mouth daily. )  . aspirin EC 81 MG tablet Take 81 mg by mouth daily.  Marland Kitchen atorvastatin (LIPITOR) 10 MG tablet TAKE 1 TABLET BY MOUTH DAILY. (Patient taking differently: Take 10 mg by mouth daily. )  . citalopram (CELEXA) 40 MG tablet Take 1 tablet (40 mg total) by mouth daily.  . hydroxypropyl methylcellulose / hypromellose (ISOPTO TEARS / GONIOVISC) 2.5 % ophthalmic solution Place 1 drop into both eyes as needed for dry eyes.  . hydrOXYzine (ATARAX/VISTARIL) 25 MG tablet Take 1 tablet (25 mg total) by mouth at bedtime as needed.  . metFORMIN (GLUCOPHAGE) 1000 MG tablet TAKE 1 TABLET BY MOUTH TWICE A DAY WITH A MEAL (Patient taking differently: Take 1,000 mg by mouth 2 (two) times daily with a meal. )  . Multiple Vitamins-Minerals (MULTIVITAMIN WITH MINERALS) tablet Take 1 tablet by mouth daily.  Marland Kitchen omega-3 acid ethyl esters (LOVAZA) 1 g capsule Take 1 g by mouth daily.  Marland Kitchen sulfamethoxazole-trimethoprim (BACTRIM DS) 800-160 MG tablet Take 1 tablet by mouth  2 (two) times daily. (Patient not taking: Reported on 11/15/2019)  . tamsulosin (FLOMAX) 0.4 MG CAPS capsule TAKE 1 CAPSULE (0.4 MG TOTAL) BY MOUTH DAILY.    Allergies:  Allergies  Allergen Reactions  . Tape Rash    Paper tape-blisters    Family History  Problem Relation Age of Onset  . Diabetes Mother   . Hyperlipidemia Mother   . Bladder Cancer Father   . Liver cancer Sister   . Diabetes Sister   . Diabetes Maternal Aunt   . Arthritis Maternal Grandmother   . Diabetes Maternal Grandmother   .  Arthritis Maternal Grandfather   . Arthritis Paternal Grandmother   . Diabetes Brother   . Heart disease Brother   . Colon cancer Neg Hx     Social History:  reports that he quit smoking about 10 years ago. His smoking use included cigarettes. He has a 27.00 pack-year smoking history. He quit smokeless tobacco use about 25 years ago.  His smokeless tobacco use included chew. He reports current alcohol use of about 2.0 standard drinks of alcohol per week. He reports that he does not use drugs.  ROS: A complete review of systems was performed.  All systems are negative except for pertinent findings as noted.  Physical Exam:  Vital signs in last 24 hours: Temp:  [97.7 F (36.5 C)] 97.7 F (36.5 C) (02/01 0651) Pulse Rate:  [92] 92 (02/01 0651) Resp:  [16] 16 (02/01 0651) BP: (142)/(93) 142/93 (02/01 0651) SpO2:  [97 %] 97 % (02/01 0651) Weight:  [id number] kg] 108 kg (02/01 0651) Constitutional:  Alert and oriented, No acute distress HEENT: [place] AT, moist mucus membranes.  Trachea midline, no masses Cardiovascular: Regular rate and rhythm, no clubbing, cyanosis, or edema. Respiratory: Normal respiratory effort, lungs clear bilaterally GI: Abdomen is soft, nontender, nondistended, no abdominal masses Skin: No rashes, bruises or suspicious lesions Neurologic: Grossly intact, no focal deficits, moving all 4 extremities Psychiatric: Normal mood and affect   Laboratory Data:  Recent Labs    11/16/19 1154  WBC 4.5  HGB 14.7  HCT 45.4   Recent Labs    11/16/19 1154  NA 137  K 4.4  CL 104  CO2 25  GLUCOSE 149*  BUN 12  CREATININE 1.01  CALCIUM 9.2   Recent Labs    11/16/19 1154  INR 0.9   No results for input(s): LABURIN in the last 72 hours. Results for orders placed or performed during the hospital encounter of 11/16/19  SARS CORONAVIRUS 2 (TAT 6-24 HRS) Nasopharyngeal Nasopharyngeal Swab     Status: None   Collection Time: 11/16/19 11:54 AM   Specimen: Nasopharyngeal Swab   Result Value Ref Range Status   SARS Coronavirus 2 NEGATIVE NEGATIVE Final    Comment: (NOTE) SARS-CoV-2 target nucleic acids are NOT DETECTED. The SARS-CoV-2 RNA is generally detectable in upper and lower respiratory specimens during the acute phase of infection. Negative results do not preclude SARS-CoV-2 infection, do not rule out co-infections with other pathogens, and should not be used as the sole basis for treatment or other patient management decisions. Negative results must be combined with clinical observations, patient history, and epidemiological information. The expected result is Negative. Fact Sheet for Patients: SugarRoll.be Fact Sheet for Healthcare Providers: https://www.woods-mathews.com/ This test is not yet approved or cleared by the [place] FDA and  has been authorized for detection and/or diagnosis of SARS-CoV-2 by FDA under an Emergency Use Authorization (EUA). This EUA will remain  in effect (meaning this test can be used) for the duration of the COVID-19 declaration under Section 56 4(b)(1) of the Act, 21 U.S.C. section 360bbb-3(b)(1), unless the authorization is terminated or revoked sooner. Performed at [facility], [place] [address], [place], [place] [address]     Assessment and Plan:   1.  Prostate cancer, high risk -patient has elected to pursue radical prostatectomy with pelvic lymph node dissection bilaterally.  We will plan for nonnerve sparing on the left and partial versus full nerve sparing on the right.  Per previous discussion, risk of surgery including risk of bleeding, infection, fistula, bowel injury, hernia, ileus, bladder neck contracture were all discussed at length.  The postoperative course was also reviewed again today including a for Foley catheter for at least a week.  We reviewed the NCCN nomogram today as well and he understands that there may be a role for salvage radiation  if his PSA does not turn to baseline or or other adverse features.  Risk of lymph node involvement was also reviewed.  Lastly, he will refuse blood products as discussed above.       11/19/2019, 7:35 AM  Hollice Espy,  MD

## 2019-11-19 NOTE — Anesthesia Procedure Notes (Signed)
Procedure Name: Intubation Date/Time: 11/19/2019 8:04 AM Performed by: Gentry Fitz, CRNA Pre-anesthesia Checklist: Patient identified, Emergency Drugs available, Suction available, Patient being monitored and Timeout performed Patient Re-evaluated:Patient Re-evaluated prior to induction Oxygen Delivery Method: Circle system utilized Preoxygenation: Pre-oxygenation with 100% oxygen Induction Type: IV induction Ventilation: Mask ventilation without difficulty Laryngoscope Size: McGraph and 4 Grade View: Grade I Tube type: Oral Tube size: 7.5 mm Number of attempts: 2 Airway Equipment and Method: Stylet Placement Confirmation: ETT inserted through vocal cords under direct vision Secured at: 23 cm Tube secured with: Tape Dental Injury: Teeth and Oropharynx as per pre-operative assessment

## 2019-11-19 NOTE — Anesthesia Preprocedure Evaluation (Signed)
Anesthesia Evaluation  Patient identified by MRN, date of birth, ID band Patient awake    Reviewed: Allergy & Precautions, NPO status , Patient's Chart, lab work & pertinent test results  History of Anesthesia Complications Negative for: history of anesthetic complications  Airway Mallampati: III       Dental   Pulmonary sleep apnea and Continuous Positive Airway Pressure Ventilation , neg COPD, Not current smoker, former smoker,           Cardiovascular hypertension, Pt. on medications (-) Past MI and (-) CHF (-) dysrhythmias + Valvular Problems/Murmurs (murmur, no tx)      Neuro/Psych neg Seizures Anxiety Depression    GI/Hepatic Neg liver ROS, GERD  Controlled,  Endo/Other  diabetes, Type 2, Oral Hypoglycemic Agents  Renal/GU negative Renal ROS     Musculoskeletal   Abdominal   Peds  Hematology   Anesthesia Other Findings   Reproductive/Obstetrics                             Anesthesia Physical Anesthesia Plan  ASA: III  Anesthesia Plan: General   Post-op Pain Management:    Induction: Intravenous  PONV Risk Score and Plan: 2 and Ondansetron and Midazolam  Airway Management Planned: Oral ETT  Additional Equipment:   Intra-op Plan:   Post-operative Plan:   Informed Consent: I have reviewed the patients History and Physical, chart, labs and discussed the procedure including the risks, benefits and alternatives for the proposed anesthesia with the patient or authorized representative who has indicated his/her understanding and acceptance.       Plan Discussed with:   Anesthesia Plan Comments:         Anesthesia Quick Evaluation

## 2019-11-19 NOTE — Transfer of Care (Signed)
Immediate Anesthesia Transfer of Care Note  Patient: Marc Aguilar  Procedure(s) Performed: XI ROBOTIC ASSISTED LAPAROSCOPIC RADICAL PROSTATECTOMY WITH LYMPH NODE DISSECTION (N/A )  Patient Location: PACU  Anesthesia Type:General  Level of Consciousness: drowsy  Airway & Oxygen Therapy: Patient Spontanous Breathing and Patient connected to face mask oxygen  Post-op Assessment: Report given to RN and Post -op Vital signs reviewed and stable  Post vital signs: Reviewed and stable  Last Vitals:  Vitals Value Taken Time  BP 113/63 11/19/19 1133  Temp 36.9 C 11/19/19 1133  Pulse 100 11/19/19 1137  Resp 24 11/19/19 1137  SpO2 100 % 11/19/19 1137  Vitals shown include unvalidated device data.  Last Pain:  Vitals:   11/19/19 1133  TempSrc:   PainSc: 0-No pain         Complications: No apparent anesthesia complications

## 2019-11-20 ENCOUNTER — Encounter: Payer: Self-pay | Admitting: Internal Medicine

## 2019-11-20 DIAGNOSIS — C61 Malignant neoplasm of prostate: Secondary | ICD-10-CM | POA: Diagnosis not present

## 2019-11-20 LAB — BASIC METABOLIC PANEL
Anion gap: 10 (ref 5–15)
BUN: 14 mg/dL (ref 6–20)
CO2: 26 mmol/L (ref 22–32)
Calcium: 8.9 mg/dL (ref 8.9–10.3)
Chloride: 102 mmol/L (ref 98–111)
Creatinine, Ser: 0.89 mg/dL (ref 0.61–1.24)
GFR calc Af Amer: >60 mL/min (ref >60)
GFR calc non Af Amer: >60 mL/min (ref >60)
Glucose, Bld: 148 mg/dL — ABNORMAL HIGH (ref 70–99)
Potassium: 4.2 mmol/L (ref 3.5–5.1)
Sodium: 138 mmol/L (ref 135–145)

## 2019-11-20 LAB — GLUCOSE, CAPILLARY
Glucose-Capillary: 133 mg/dL — ABNORMAL HIGH (ref 70–99)
Glucose-Capillary: 136 mg/dL — ABNORMAL HIGH (ref 70–99)

## 2019-11-20 LAB — HEMOGLOBIN AND HEMATOCRIT, BLOOD
HCT: 39.8 % (ref 39.0–52.0)
Hemoglobin: 13 g/dL (ref 13.0–17.0)

## 2019-11-20 MED ORDER — OXYBUTYNIN CHLORIDE 5 MG PO TABS: 5.0000 mg | ORAL_TABLET | Freq: Three times a day (TID) | ORAL | 0 refills | Status: DC | PRN

## 2019-11-20 MED ORDER — DOCUSATE SODIUM 100 MG PO CAPS: 100.0000 mg | ORAL_CAPSULE | Freq: Two times a day (BID) | ORAL | 0 refills | Status: AC

## 2019-11-20 MED ORDER — OXYCODONE-ACETAMINOPHEN 5-325 MG PO TABS: 1.0000 | ORAL_TABLET | Freq: Four times a day (QID) | ORAL | 0 refills | Status: DC | PRN

## 2019-11-20 MED FILL — OXYCODONE-ACETAMINOPHEN 5-3: 5-325 | 3 days supply | Qty: 20 | Fill #0

## 2019-11-20 MED FILL — OXYBUTYNIN 5 MG TABLET: 5 | 10 days supply | Qty: 30 | Fill #0

## 2019-11-20 NOTE — Progress Notes (Signed)
Marc Aguilar and O x4. VSS. Pt tolerating diet well. No complaints of nausea or vomiting. IV removed intact, prescriptions given. Pt voices understanding of discharge instructions with no further questions. Patient discharged via wheelchair with NT  Allergies as of 11/20/2019      Reactions   Tape Rash   Paper tape-blisters      Medication List    STOP taking these medications   sulfamethoxazole-trimethoprim 800-160 MG tablet Commonly known as: Bactrim DS   tamsulosin 0.4 MG Caps capsule Commonly known as: FLOMAX     TAKE these medications   Accu-Chek FastClix Lancets Misc USE TWICE Aguilar DAY AS DIRECTED   albuterol 108 (90 Base) MCG/ACT inhaler Commonly known as: VENTOLIN HFA Inhale 2 puffs into the lungs every 6 (six) hours as needed for wheezing or shortness of breath.   Alcohol Prep Pads 1 each by Does not apply route 2 (two) times daily.   amLODipine 5 MG tablet Commonly known as: NORVASC TAKE 1 TABLET (5 MG TOTAL) BY MOUTH DAILY What changed:   how much to take  how to take this  when to take this  additional instructions   aspirin EC 81 MG tablet Take 81 mg by mouth daily.   atorvastatin 10 MG tablet Commonly known as: LIPITOR TAKE 1 TABLET BY MOUTH DAILY.   citalopram 40 MG tablet Commonly known as: CELEXA Take 1 tablet (40 mg total) by mouth daily.   docusate sodium 100 MG capsule Commonly known as: COLACE Take 1 capsule (100 mg total) by mouth 2 (two) times daily for 10 days.   glucose blood test strip TEST BLOOD SUGAR AS DIRECTED TWICE DAILY   hydroxypropyl methylcellulose / hypromellose 2.5 % ophthalmic solution Commonly known as: ISOPTO TEARS / GONIOVISC Place 1 drop into both eyes as needed for dry eyes.   hydrOXYzine 25 MG tablet Commonly known as: ATARAX/VISTARIL Take 1 tablet (25 mg total) by mouth at bedtime as needed.   metFORMIN 1000 MG tablet Commonly known as: GLUCOPHAGE TAKE 1 TABLET BY MOUTH TWICE Aguilar DAY WITH Aguilar MEAL What  changed:   how much to take  how to take this  when to take this  additional instructions   multivitamin with minerals tablet Take 1 tablet by mouth daily.   omega-3 acid ethyl esters 1 g capsule Commonly known as: LOVAZA Take 1 g by mouth daily.   oxybutynin 5 MG tablet Commonly known as: DITROPAN Take 1 tablet (5 mg total) by mouth every 8 (eight) hours as needed for bladder spasms.   oxyCODONE-acetaminophen 5-325 MG tablet Commonly known as: PERCOCET/ROXICET Take 1-2 tablets by mouth every 6 (six) hours as needed for moderate pain.       Vitals:   11/20/19 0448 11/20/19 0855  BP: (!) 142/87 (!) 118/101  Pulse: 89   Resp: 20   Temp: 98.7 F (37.1 Marc)   SpO2: 97%     Marc Aguilar Marc Aguilar

## 2019-11-20 NOTE — Discharge Summary (Signed)
Date of admission: 11/19/2019  Date of discharge: 11/20/2019  Admission diagnosis: Gleason 4+4 prostate cancer  Discharge diagnosis: Same as above  Secondary diagnoses:  Patient Active Problem List   Diagnosis Date Noted  . Prostate cancer ([place]) 11/19/2019  . Osteoarthritis 11/14/2018  . Migraines 11/14/2018  . Anxiety and depression 11/14/2018  . HTN (hypertension) 11/14/2018  . Benign prostatic hyperplasia with urinary frequency 10/26/2016  . HLD (hyperlipidemia) 10/02/2015  . OSA (obstructive sleep apnea) 03/13/2015  . Seasonal allergies 03/13/2015  . GERD (gastroesophageal reflux disease) 03/13/2015  . DM type 2 (diabetes mellitus, type 2) ([place]) 10/30/2013    History and Physical: For full details, please see admission history and physical. Briefly, Marc Aguilar is a 59 y.o. year old patient admitted on 11/19/2019 for scheduled robotic assisted laparoscopic prostatectomy, nonnerve sparing on left and partial nerve sparing on right, with bilateral pelvic lymph node dissection with Dr. Erlene Quan.   Hospital Course: Patient tolerated the procedure well.  He was then transferred to the floor after an uneventful PACU stay.  His hospital course was uncomplicated.  On POD#1 he had met discharge criteria: was eating a carb modified diet, was up and ambulating independently, pain was well controlled, catheter was draining appropriately, and was ready for discharge.  Laboratory values:  Recent Labs    11/20/19 0445  HGB 13.0  HCT 39.8   Recent Labs    11/20/19 0445  NA 138  K 4.2  CL 102  CO2 26  GLUCOSE 148*  BUN 14  CREATININE 0.89  CALCIUM 8.9   Results for orders placed or performed during the hospital encounter of 11/16/19  SARS CORONAVIRUS 2 (TAT 6-24 HRS) Nasopharyngeal Nasopharyngeal Swab     Status: None   Collection Time: 11/16/19 11:54 AM   Specimen: Nasopharyngeal Swab  Result Value Ref Range Status   SARS Coronavirus 2 NEGATIVE NEGATIVE Final    Comment:  (NOTE) SARS-CoV-2 target nucleic acids are NOT DETECTED. The SARS-CoV-2 RNA is generally detectable in upper and lower respiratory specimens during the acute phase of infection. Negative results do not preclude SARS-CoV-2 infection, do not rule out co-infections with other pathogens, and should not be used as the sole basis for treatment or other patient management decisions. Negative results must be combined with clinical observations, patient history, and epidemiological information. The expected result is Negative. Fact Sheet for Patients: SugarRoll.be Fact Sheet for Healthcare Providers: https://www.woods-mathews.com/ This test is not yet approved or cleared by the [place] FDA and  has been authorized for detection and/or diagnosis of SARS-CoV-2 by FDA under an Emergency Use Authorization (EUA). This EUA will remain  in effect (meaning this test can be used) for the duration of the COVID-19 declaration under Section 56 4(b)(1) of the Act, 21 U.S.C. section 360bbb-3(b)(1), unless the authorization is terminated or revoked sooner. Performed at [facility], [place] [address], [place], [place] [address]    Disposition: Home  Discharge instruction: The patient was instructed to be ambulatory but told to refrain from heavy lifting, strenuous activity, or driving.  He was counseled that he could resume typical household chores that do not involve heavy lifting, however he should monitor his urinary output and decrease activity if he develops sudden worsening and gross hematuria.  Discharge medications:  Allergies as of 11/20/2019      Reactions   Tape Rash   Paper tape-blisters      Medication List    STOP taking these medications   sulfamethoxazole-trimethoprim 800-160  MG tablet Commonly known as: Bactrim DS   tamsulosin 0.4 MG Caps capsule Commonly known as: FLOMAX     TAKE these medications   Accu-Chek FastClix Lancets  Misc USE TWICE A DAY AS DIRECTED   albuterol 108 (90 Base) MCG/ACT inhaler Commonly known as: VENTOLIN HFA Inhale 2 puffs into the lungs every 6 (six) hours as needed for wheezing or shortness of breath.   Alcohol Prep Pads 1 each by Does not apply route 2 (two) times daily.   amLODipine 5 MG tablet Commonly known as: NORVASC TAKE 1 TABLET (5 MG TOTAL) BY MOUTH DAILY What changed:   how much to take  how to take this  when to take this  additional instructions   aspirin EC 81 MG tablet Take 81 mg by mouth daily.   atorvastatin 10 MG tablet Commonly known as: LIPITOR TAKE 1 TABLET BY MOUTH DAILY.   citalopram 40 MG tablet Commonly known as: CELEXA Take 1 tablet (40 mg total) by mouth daily.   docusate sodium 100 MG capsule Commonly known as: COLACE Take 1 capsule (100 mg total) by mouth 2 (two) times daily for 10 days.   glucose blood test strip TEST BLOOD SUGAR AS DIRECTED TWICE DAILY   hydroxypropyl methylcellulose / hypromellose 2.5 % ophthalmic solution Commonly known as: ISOPTO TEARS / GONIOVISC Place 1 drop into both eyes as needed for dry eyes.   hydrOXYzine 25 MG tablet Commonly known as: ATARAX/VISTARIL Take 1 tablet (25 mg total) by mouth at bedtime as needed.   metFORMIN 1000 MG tablet Commonly known as: GLUCOPHAGE TAKE 1 TABLET BY MOUTH TWICE A DAY WITH A MEAL What changed:   how much to take  how to take this  when to take this  additional instructions   multivitamin with minerals tablet Take 1 tablet by mouth daily.   omega-3 acid ethyl esters 1 g capsule Commonly known as: LOVAZA Take 1 g by mouth daily.   oxybutynin 5 MG tablet Commonly known as: DITROPAN Take 1 tablet (5 mg total) by mouth every 8 (eight) hours as needed for bladder spasms.   oxyCODONE-acetaminophen 5-325 MG tablet Commonly known as: PERCOCET/ROXICET Take 1-2 tablets by mouth every 6 (six) hours as needed for moderate pain.      Followup:  1 week for  catheter removal, 1 month for postop follow-up with PSA prior as below. Future Appointments  Date Time Provider [place]  11/26/2019  9:30 AM Aminta Sakurai, Aldona Bar, PA-C [facility] None  12/17/2019 11:00 AM [facility] None  12/19/2019  1:30 PM Hollice Espy, MD [facility] None

## 2019-11-20 NOTE — Progress Notes (Signed)
Urology Inpatient Progress Note  Subjective: Marc Aguilar is a 59 y.o. male with Gleason 4+4 prostate cancer admitted on 11/19/2019 for scheduled robotic assisted laparoscopic prostatectomy, nonnerve sparing on left and partial nerve sparing on right, with bilateral pelvic lymph node dissection with Dr. Erlene Quan.  No significant intraoperative findings.  Creatinine, hemoglobin, and hematocrit WNL.  Patient was ambulatory last night.  He does report some right-sided abdominal pain with movement.  He denies nausea and vomiting; diet advanced this morning.  No flatus yet.  Anti-infectives: Anti-infectives (From admission, onward)   Start     Dose/Rate Route Frequency Ordered Stop   11/19/19 1600  ceFAZolin (ANCEF) IVPB 1 g/50 mL premix     1 g 100 mL/hr over 30 Minutes Intravenous Every 8 hours 11/19/19 1534 11/20/19 0239   11/19/19 0653  ceFAZolin (ANCEF) 2-4 GM/100ML-% IVPB    Note to Pharmacy: Leonia Reader   : cabinet override      11/19/19 0653 11/19/19 0814   11/19/19 0557  ceFAZolin (ANCEF) IVPB 2g/100 mL premix     2 g 200 mL/hr over 30 Minutes Intravenous 30 min pre-op 11/19/19 0557 11/19/19 0804      Current Facility-Administered Medications  Medication Dose Route Frequency Provider Last Rate Last Admin  . acetaminophen (TYLENOL) tablet 650 mg  650 mg Oral Q4H PRN Hollice Espy, MD      . albuterol (PROVENTIL) (2.5 MG/3ML) 0.083% nebulizer solution 3 mL  3 mL Inhalation Q6H PRN Hollice Espy, MD      . amLODipine (NORVASC) tablet 5 mg  5 mg Oral Daily Hollice Espy, MD   5 mg at 11/20/19 0856  . aspirin EC tablet 81 mg  81 mg Oral Daily Hollice Espy, MD   81 mg at 11/20/19 0856  . atorvastatin (LIPITOR) tablet 10 mg  10 mg Oral Daily Hollice Espy, MD   10 mg at 11/20/19 0855  . Chlorhexidine Gluconate Cloth 2 % PADS 6 each  6 each Topical Daily Hollice Espy, MD      . citalopram (CELEXA) tablet 40 mg  40 mg Oral Daily Hollice Espy, MD   40 mg at 11/20/19 0856   . diphenhydrAMINE (BENADRYL) injection 12.5 mg  12.5 mg Intravenous Q6H PRN Hollice Espy, MD       Or  . diphenhydrAMINE (BENADRYL) 12.5 MG/5ML elixir 12.5 mg  12.5 mg Oral Q6H PRN Hollice Espy, MD      . docusate sodium (COLACE) capsule 100 mg  100 mg Oral BID Hollice Espy, MD   100 mg at 11/20/19 0856  . heparin injection 5,000 Units  5,000 Units Subcutaneous Q8H Hollice Espy, MD   5,000 Units at 11/20/19 0447  . insulin aspart (novoLOG) injection 0-15 Units  0-15 Units Subcutaneous TID WC Hollice Espy, MD   2 Units at 11/20/19 [phone]  . insulin aspart (novoLOG) injection 0-5 Units  0-5 Units Subcutaneous QHS Hollice Espy, MD      . insulin aspart (novoLOG) injection 4 Units  4 Units Subcutaneous TID WC Hollice Espy, MD   4 Units at 11/20/19 [phone]  . morphine 2 MG/ML injection 2-4 mg  2-4 mg Intravenous Q2H PRN Hollice Espy, MD   2 mg at 11/19/19 1755  . ondansetron (ZOFRAN) injection 4 mg  4 mg Intravenous Q4H PRN Hollice Espy, MD      . opium-belladonna (B&O SUPPRETTES) 16.2-60 MG suppository 1 suppository  1 suppository Rectal Q6H PRN Hollice Espy, MD      . oxybutynin [facility])  tablet 5 mg  5 mg Oral Q8H PRN Hollice Espy, MD      . oxyCODONE-acetaminophen (PERCOCET/ROXICET) 5-325 MG per tablet 1-2 tablet  1-2 tablet Oral Q4H PRN Hollice Espy, MD   2 tablet at 11/20/19 1015  . polyvinyl alcohol (LIQUIFILM TEARS) 1.4 % ophthalmic solution 1 drop  1 drop Both Eyes PRN Hollice Espy, MD       Objective: Vital signs in last 24 hours: Temp:  [97.3 F (36.3 C)-99.2 F (37.3 C)] 98.7 F (37.1 C) (02/02 0448) Pulse Rate:  [89-105] 89 (02/02 0448) Resp:  [12-24] 20 (02/02 0448) BP: (107-147)/(63-101) 118/101 (02/02 0855) SpO2:  [91 %-100 %] 97 % (02/02 0448)  Intake/Output from previous day: 02/01 0701 - 02/02 0700 In: 3019.1 [P.O.:240; I.V.:2679.1; IV Piggyback:100] Out: 2925 [Urine:2825; Blood:100] Intake/Output this shift: No intake/output data  recorded.  Physical Exam Vitals and nursing note reviewed.  Constitutional:      General: He is not in acute distress.    Appearance: He is not ill-appearing, toxic-appearing or diaphoretic.  HENT:     Head: Normocephalic and atraumatic.  Pulmonary:     Effort: Pulmonary effort is normal. No respiratory distress.  Abdominal:     General: Abdomen is protuberant.     Palpations: Abdomen is soft.     Tenderness: There is no guarding or rebound.  Skin:    General: Skin is warm and dry.  Neurological:     Mental Status: He is alert and oriented to person, place, and time.  Psychiatric:        Mood and Affect: Mood normal.        Behavior: Behavior normal.    Lab Results:  Recent Labs    11/20/19 0445  HGB 13.0  HCT 39.8   BMET Recent Labs    11/20/19 0445  NA 138  K 4.2  CL 102  CO2 26  GLUCOSE 148*  BUN 14  CREATININE 0.89  CALCIUM 8.9   Assessment & Plan: 59 year old male with Gleason 4+4 prostate cancer now s/p robotic assisted laparoscopic prostatectomy with bilateral pelvic lymph node dissection.  He is recovering well with normal postoperative abdominal discomfort.  We will plan for discharge this afternoon with plans for Foley catheter removal in clinic on POD 7.  Debroah Loop, PA-C 11/20/2019

## 2019-11-21 LAB — SURGICAL PATHOLOGY

## 2019-11-22 ENCOUNTER — Other Ambulatory Visit: Payer: Self-pay | Admitting: *Deleted

## 2019-11-22 ENCOUNTER — Encounter: Payer: Self-pay | Admitting: *Deleted

## 2019-11-22 NOTE — Patient Outreach (Signed)
[facility]) Care Management  11/22/2019  SINCER COLLIE 29-Nov-1960 [id number]   Transition of care call/case closure   Referral received:11/05/19 Initial outreach:11/20/19 Insurance: [employer]   Subjective: Initial successful telephone call to patient's preferred number in order to complete transition of care assessment; 2 HIPAA identifiers verified. Explained purpose of call and completed transition of care assessment.  Deighton reports that he is doing okay, having some pain now, he reports surgical pain is managed with prn oxycodone and ditropan for bladder spasms. He  denies post-operative problems, says surgical incisions are unremarkable. He reports not sleeping well at night yet, continues using CPAP.  He reports appetite not that good yet, drinking well, denies nausea.He reports foley catheter being in place with bloody colored urine no blood clots noted, he reports good output and managing emptying foley bag, reports having surgeon office visit on 2/8 for catheter removal.  He reports not having a bowel movement in last 3 days, passing gas, reports taking stool softener. He reports tolerating mobility in home without difficulty.   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  He reports wife has has a stroke so limited in ability to assist him, friends available to provide meal support and transportation.   He discussed  ongoing health issues of Diabetes and that he is enrolled in [employer] chronic disease management program.  He reports bloods sugars staying in the 130 range at home.  He says that he does have the hospital indemnity plan and has contact number to file a claim he reports being on FMLA and has been denied short disability plans to contact benefit office again, reports using PAL time .  He reports using  a [facility] outpatient pharmacy, at [facility].  He  denies educational needs related to staying safe during the COVID 19 pandemic.    Objective:  Roderrick Groesbeck   was hospitalized at [facility] from 2/1 to 2/2 for Robotic assisted laparoscopic radical prostatectomy  Comorbidities include: Diabetes,A1c 7.8, OSA with cpap, prostate CA,HLD, HTN   He  was discharged to home on 11/20/19 without the need for home health services or DME.   Assessment:  Patient voices good understanding of all discharge instructions.  See transition of care flowsheet for assessment details.   Plan:  Reviewed hospital discharge diagnosis of Robotic assisted laparoscopic radial prostatectomy   and discharge treatment plan using hospital discharge instructions, assessing medication adherence, reviewing problems requiring provider notification, and discussing the importance of follow up with surgeon, primary care provider as  directed.  Reviewed [place] healthy lifestyle program information to receive discounted premium for  2022  Step 1: Get annual physical between October 18, 2018 and April 17, 2020; Step 2: Complete your health assessment between October 19, 2019 and June 18, 2020 at [email or web address] Step 3:Identify your current health status and complete the corresponding action step between January 1, and June 18, 2020.   Using [facility] website, verified that patient is an active participate in [place]'s Active [employer] Management chronic disease management program.    No ongoing care management needs identified so will close case to [place] Management services and route successful outreach letter with [place] Management pamphlet and 24 Hour Nurse Line Magnet to [facility] clinical pool to be mailed to patient's home address.  Thanked patient for their services to [facility]   Joylene Draft, RN, BSN  [place] Management Coordinator  [phone]- Mobile [phone]- Toll Free Main Office

## 2019-11-23 ENCOUNTER — Telehealth: Payer: Self-pay | Admitting: Urology

## 2019-11-23 NOTE — Telephone Encounter (Signed)
Called patient to review surgical pathology.  All questions answered.  Patient notes that he is passing gas but not yet had a bowel movement.  Recommend either suppository or MiraLAX to help him have a bowel movement at this point in time.  He also ask if he is okay to take ibuprofen which is fine at this point.  Hollice Espy, MD

## 2019-11-26 ENCOUNTER — Other Ambulatory Visit: Payer: Self-pay

## 2019-11-26 ENCOUNTER — Ambulatory Visit (INDEPENDENT_AMBULATORY_CARE_PROVIDER_SITE_OTHER): Payer: No Typology Code available for payment source | Admitting: Physician Assistant

## 2019-11-26 VITALS — BP 133/85 | HR 69 | Ht 71.0 in | Wt 244.0 lb

## 2019-11-26 DIAGNOSIS — C61 Malignant neoplasm of prostate: Secondary | ICD-10-CM

## 2019-11-26 NOTE — Progress Notes (Signed)
Catheter Removal  Patient is present today for a catheter removal.  29ml of water was drained from the balloon. A 18FR foley cath was removed from the bladder no complications were noted . Patient tolerated well.  Performed by: Debroah Loop, PA-C   Follow up/ Additional notes: PSA with subsequent postop follow-up with Dr. Erlene Quan as below. Future Appointments  Date Time Provider [place]  12/17/2019 11:00 AM [facility] None  12/19/2019  1:30 PM Hollice Espy, MD [facility] None

## 2019-12-14 ENCOUNTER — Other Ambulatory Visit: Payer: Self-pay | Admitting: Family Medicine

## 2019-12-14 DIAGNOSIS — R972 Elevated prostate specific antigen [PSA]: Secondary | ICD-10-CM

## 2019-12-15 ENCOUNTER — Encounter (INDEPENDENT_AMBULATORY_CARE_PROVIDER_SITE_OTHER): Payer: Self-pay

## 2019-12-17 ENCOUNTER — Other Ambulatory Visit: Payer: Self-pay | Admitting: Physician Assistant

## 2019-12-17 ENCOUNTER — Other Ambulatory Visit: Payer: Self-pay

## 2019-12-17 ENCOUNTER — Other Ambulatory Visit: Payer: No Typology Code available for payment source

## 2019-12-17 DIAGNOSIS — R972 Elevated prostate specific antigen [PSA]: Secondary | ICD-10-CM

## 2019-12-17 MED FILL — CITALOPRAM HBR 40 MG TABLET: 40 | 30 days supply | Qty: 30 | Fill #1

## 2019-12-17 MED FILL — metFORMIN HCL 1000 MG TABS: 1000 | 90 days supply | Qty: 180 | Fill #2

## 2019-12-17 MED FILL — ATORVASTATIN 10 MG TABLET: 10 | 30 days supply | Qty: 30 | Fill #1

## 2019-12-18 LAB — PSA: Prostate Specific Ag, Serum: <0.1 ng/mL (ref 0.0–4.0)

## 2019-12-18 NOTE — Progress Notes (Signed)
12/19/2019 8:25 PM   Marc Aguilar [name] 23, 1961 [id number]  Referring provider: [name], NP [address] [place],  [place] [address]  Chief Complaint  Patient presents with  . Prostate Cancer    HPI: Marc Aguilar is a 59 yo [place] American M with a personal history of prostate cancer who returns today for post-op f/u.   Prostate biopsy results reveal evidence of high risk prostate cancer up to Gleason 4+4, involving 5 of 12 cores on the left and one isolated low risk core on the right.  Highest volume 80% of 4+4 at the left base.  Metastatic evaluation including CT abdomen pelvis with contrast as well as bone scan negative for any evidence of metastatic disease.  He underwent robotic assisted laparoscopic radical prostatectomy on 11/19/2019. His surgical pathology 11/19/19 gleason 4+ 3 with less than 5% of a tertiary pattern 5 +EPE nonfocal - SB +margin on left posterior -lymph nodes pT3a pN0.   His PSA on 12/17/2019 was undetectable.   He is feeling sore overall. He reports of leakage, urgency and nocturia x2-3. He reports of being drier at night rather than during the day.   He reports of having some partial erections and has not used viagara or sildenafil in the past.   PMH: Past Medical History:  Diagnosis Date  . Anxiety   . Arthritis    "knees and hips" (02/28/2018)  . [facility])    prostate   . Chicken pox   . Depression   . GERD (gastroesophageal reflux disease)   . Heart murmur   . Hernia, abdominal   . High cholesterol   . History of gout   . Migraine    "probably monthly" (02/28/2018)  . OSA on CPAP   . Pneumonia    "once" (02/28/2018)  . Refusal of blood transfusions as patient is Jehovah's Witness   . Seasonal allergies   . Type II diabetes mellitus ([place])     Surgical History: Past Surgical History:  Procedure Laterality Date  . CARDIAC CATHETERIZATION  02/28/2018  . LEFT HEART CATH AND CORONARY ANGIOGRAPHY N/A 02/28/2018   Procedure:  LEFT HEART CATH AND CORONARY ANGIOGRAPHY;  Surgeon: Sherren Mocha, MD;  Location: [facility] CV LAB;  Service: Cardiovascular;  Laterality: N/A;  . ROBOT ASSISTED LAPAROSCOPIC RADICAL PROSTATECTOMY N/A 11/19/2019   Procedure: XI ROBOTIC ASSISTED LAPAROSCOPIC RADICAL PROSTATECTOMY WITH LYMPH NODE DISSECTION;  Surgeon: Hollice Espy, MD;  Location: [facility] ORS;  Service: Urology;  Laterality: N/A;  . WRIST GANGLION EXCISION Left 1984    Home Medications:  Allergies as of 12/19/2019      Reactions   Tape Rash   Paper tape-blisters      Medication List       Accurate as of December 19, 2019  8:25 PM. If you have any questions, ask your nurse or doctor.        Accu-Chek FastClix Lancets Misc USE TWICE A DAY AS DIRECTED   albuterol 108 (90 Base) MCG/ACT inhaler Commonly known as: VENTOLIN HFA Inhale 2 puffs into the lungs every 6 (six) hours as needed for wheezing or shortness of breath.   Alcohol Prep Pads 1 each by Does not apply route 2 (two) times daily.   amLODipine 5 MG tablet Commonly known as: NORVASC TAKE 1 TABLET (5 MG TOTAL) BY MOUTH DAILY What changed:   how much to take  how to take this  when to take this  additional instructions   aspirin  EC 81 MG tablet Take 81 mg by mouth daily.   atorvastatin 10 MG tablet Commonly known as: LIPITOR TAKE 1 TABLET BY MOUTH DAILY.   citalopram 40 MG tablet Commonly known as: CELEXA Take 1 tablet (40 mg total) by mouth daily.   glucose blood test strip TEST BLOOD SUGAR AS DIRECTED TWICE DAILY   hydroxypropyl methylcellulose / hypromellose 2.5 % ophthalmic solution Commonly known as: ISOPTO TEARS / GONIOVISC Place 1 drop into both eyes as needed for dry eyes.   hydrOXYzine 25 MG tablet Commonly known as: ATARAX/VISTARIL Take 1 tablet (25 mg total) by mouth at bedtime as needed.   metFORMIN 1000 MG tablet Commonly known as: GLUCOPHAGE TAKE 1 TABLET BY MOUTH TWICE A DAY WITH A MEAL What changed:   how much to take   how to take this  when to take this  additional instructions   multivitamin with minerals tablet Take 1 tablet by mouth daily.   omega-3 acid ethyl esters 1 g capsule Commonly known as: LOVAZA Take 1 g by mouth daily.   oxybutynin 5 MG tablet Commonly known as: DITROPAN Take 1 tablet (5 mg total) by mouth every 8 (eight) hours as needed for bladder spasms.   oxyCODONE-acetaminophen 5-325 MG tablet Commonly known as: PERCOCET/ROXICET Take 1-2 tablets by mouth every 6 (six) hours as needed for moderate pain.   sildenafil 20 MG tablet Commonly known as: Revatio Take 1 tablet (20 mg total) by mouth as needed. Take 1-5 tabs as needed prior to intercourse Started by: Hollice Espy, MD       Allergies:  Allergies  Allergen Reactions  . Tape Rash    Paper tape-blisters    Family History: Family History  Problem Relation Age of Onset  . Diabetes Mother   . Hyperlipidemia Mother   . Bladder Cancer Father   . Liver cancer Sister   . Diabetes Sister   . Diabetes Maternal Aunt   . Arthritis Maternal Grandmother   . Diabetes Maternal Grandmother   . Arthritis Maternal Grandfather   . Arthritis Paternal Grandmother   . Diabetes Brother   . Heart disease Brother   . Colon cancer Neg Hx     Social History:  reports that he quit smoking about 10 years ago. His smoking use included cigarettes. He has a 27.00 pack-year smoking history. He quit smokeless tobacco use about 25 years ago.  His smokeless tobacco use included chew. He reports current alcohol use of about 2.0 standard drinks of alcohol per week. He reports that he does not use drugs.   Physical Exam: BP (!) 156/76   Pulse 99   Ht 5\' 11"  (1.803 m)   Wt 239 lb (108.4 kg)   BMI 33.33 kg/m   Constitutional:  Alert and oriented, No acute distress. HEENT: [place] AT, moist mucus membranes.  Trachea midline, no masses. Cardiovascular: No clubbing, cyanosis, or edema. Respiratory: Normal respiratory effort, no  increased work of breathing. GI: Abdomen is soft, nontender, nondistended, no abdominal masses Well healed incisions  Skin: No rashes, bruises or suspicious lesions. Neurologic: Grossly intact, no focal deficits, moving all 4 extremities. Psychiatric: Normal mood and affect.  Laboratory Data: Lab Results  Component Value Date   WBC 4.5 11/16/2019   HGB 13.0 11/20/2019   HCT 39.8 11/20/2019   MCV 79.6 (L) 11/16/2019   PLT 237 11/16/2019    Lab Results  Component Value Date   CREATININE 0.89 11/20/2019    Lab Results  Component Value Date  PSA 5.47 (H) 08/01/2019   PSA 3.40 11/10/2017   PSA 2.41 10/26/2016    Lab Results  Component Value Date   HGBA1C 7.8 (H) 11/19/2019   Assessment & Plan:    1. Prostate cancer NED, PSA undetectable, repeat PSA in 6 months Surgical path reviewed Increased risk for recurrence in setting of + margin  2. 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   [place] [address], [place], [place] [address] [phone]  I, Lucas Mallow, am acting as a scribe for Dr. Hollice Espy,  I have reviewed the above documentation for accuracy and completeness, and I agree with the above.   Hollice Espy, MD

## 2019-12-19 ENCOUNTER — Other Ambulatory Visit: Payer: Self-pay | Admitting: Urology

## 2019-12-19 ENCOUNTER — Other Ambulatory Visit: Payer: Self-pay

## 2019-12-19 ENCOUNTER — Ambulatory Visit (INDEPENDENT_AMBULATORY_CARE_PROVIDER_SITE_OTHER): Payer: No Typology Code available for payment source | Admitting: Urology

## 2019-12-19 VITALS — BP 156/76 | HR 99 | Ht 71.0 in | Wt 239.0 lb

## 2019-12-19 DIAGNOSIS — N5231 Erectile dysfunction following radical prostatectomy: Secondary | ICD-10-CM

## 2019-12-19 DIAGNOSIS — N393 Stress incontinence (female) (male): Secondary | ICD-10-CM

## 2019-12-19 DIAGNOSIS — C61 Malignant neoplasm of prostate: Secondary | ICD-10-CM

## 2019-12-19 MED ORDER — SILDENAFIL CITRATE 20 MG PO TABS: 20.0000 mg | ORAL_TABLET | ORAL | 11 refills | Status: DC | PRN

## 2019-12-19 MED FILL — SILDENAFIL CITRATE 20 MG TA: 20 | 30 days supply | Qty: 30 | Fill #0

## 2019-12-31 ENCOUNTER — Encounter: Payer: No Typology Code available for payment source | Admitting: Internal Medicine

## 2020-01-17 ENCOUNTER — Other Ambulatory Visit: Payer: Self-pay | Admitting: Internal Medicine

## 2020-01-17 MED FILL — CITALOPRAM HBR 40 MG TABLET: 40 | 30 days supply | Qty: 30 | Fill #2

## 2020-01-17 MED FILL — SILDENAFIL CITRATE 20 MG TA: 20 | 30 days supply | Qty: 30 | Fill #1

## 2020-01-17 NOTE — Telephone Encounter (Signed)
Last filled 10/07/2019... please advise

## 2020-01-18 MED FILL — hydrOXYzine HCL 25 MG TABS: 25 | 30 days supply | Qty: 30 | Fill #0

## 2020-01-29 ENCOUNTER — Ambulatory Visit: Payer: No Typology Code available for payment source | Attending: Urology | Admitting: Physical Therapy

## 2020-01-29 ENCOUNTER — Other Ambulatory Visit: Payer: Self-pay

## 2020-01-29 DIAGNOSIS — M6281 Muscle weakness (generalized): Secondary | ICD-10-CM | POA: Diagnosis present

## 2020-01-29 DIAGNOSIS — M6208 Separation of muscle (nontraumatic), other site: Secondary | ICD-10-CM | POA: Diagnosis present

## 2020-01-29 NOTE — Patient Instructions (Addendum)
Stretches :   ___  Midback stretches:   Side lean and then a palm up (high five to the sky)  And then slight rotation of ribs by looking up to the sky) 5 reps    ___  Low back  Hands on kitchen counter,   Palms shoulder width apart  Minisquat postion Trunk is parallel to floor  A) Pull buttocks back to lengthen spine, knees bent  3 breaths   B) Bring R hand to the L, and stretch the R side trunk  3 breaths   Brings hands to center again Do the same to the L side stretch by placing L hand on top of R   D) Modified thread the needle R hand on L thigh, L  thigh pushing out slightly as the R hands pull in,  elbow bent and pulls to theR,  Look under L armpit   Do the same to other side   __   Proper body mechanics with getting out of a chair to decrease strain  on back &pelvic floor   Avoid holding your breath when Getting out of the chair:  Scoot to front part of chair chair Heels behind feet, feet are hip width apart, nose over toes  Inhale like you are smelling roses Exhale to stand

## 2020-01-30 NOTE — Therapy (Addendum)
[place] MAIN [facility] SERVICES [address] [place], [place], [address] Phone: [phone]   Fax:  [phone]  Physical Therapy Evaluation  Patient Details  Name: Marc Aguilar MRN: [id number] Date of Birth: 10-27-1960 Referring Provider (PT): Joanette Gula Date: 01/29/2020    Past Medical History:  Diagnosis Date  . Anxiety   . Arthritis    "knees and hips" (02/28/2018)  . Cancer [facility])    prostate   . Chicken pox   . Depression   . GERD (gastroesophageal reflux disease)   . Heart murmur   . Hernia, abdominal   . High cholesterol   . History of gout   . Migraine    "probably monthly" (02/28/2018)  . OSA on CPAP   . Pneumonia    "once" (02/28/2018)  . Refusal of blood transfusions as patient is Jehovah's Witness   . Seasonal allergies   . Type II diabetes mellitus ([place])     Past Surgical History:  Procedure Laterality Date  . CARDIAC CATHETERIZATION  02/28/2018  . LEFT HEART CATH AND CORONARY ANGIOGRAPHY N/A 02/28/2018   Procedure: LEFT HEART CATH AND CORONARY ANGIOGRAPHY;  Surgeon: Sherren Mocha, MD;  Location: [place] [facility];  Service: Cardiovascular;  Laterality: N/A;  . ROBOT ASSISTED LAPAROSCOPIC RADICAL PROSTATECTOMY N/A 11/19/2019   Procedure: XI ROBOTIC ASSISTED LAPAROSCOPIC RADICAL PROSTATECTOMY WITH LYMPH NODE DISSECTION;  Surgeon: Hollice Espy, MD;  Location: [facility] ORS;  Service: Urology;  Laterality: N/A;  . WRIST GANGLION EXCISION Left 1984    There were no vitals filed for this visit.   Subjective Assessment - 01/31/20 1345    Subjective  Pt underwent prostatectomy on 11/19/19. Pt reports urinary leakage with standing. Pt experiences urge which causes pain at a level of 5-6/10 below the belly button.  It is relieved after urination. Nocturia occurs 1 x night.  Pt is changing urinary pads 2-3 x day. 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         [facility] PT Assessment - 01/31/20 1804      Assessment   Medical Diagnosis  SUI    Referring Provider (PT)  Erlene Quan      Precautions   Precautions  None      Restrictions   Weight Bearing Restrictions  No      Balance Screen   Has the patient fallen in the past 6 months  No      Strength   Overall Strength Comments  hip ext prone: L 3/5, R 4-/5       Palpation   Spinal mobility  increased tightness along L medial scapula / teres minor     SI assessment   L iliac crest higher, R shoulder higher                 Objective measurements completed on examination: See above findings.    Pelvic Floor Special Questions - 01/31/20 1356    Diastasis Recti  3 fingers width       [facility] Adult PT Treatment/Exercise - 01/31/20 1804      Ambulation/Gait   Gait Comments  decreased stance phase R, slight pelvic drop R, minimial rotation posterior R               PT Long Term Goals - 01/31/20 1352  PT LONG TERM GOAL #1   Title  Pt will decrease urinary pads from 2-3 pads / day to < 1 pad a day in order to perform work duties    Time  8    Period  Weeks    Status  New      PT LONG TERM GOAL #2   Title  Pt will be IND with a fitness routine Scientist, research (physical sciences), pilates chair, resistance bands, dumbbells, treadmill)  in order to return to wellness and conditioning.    Time  10    Period  Weeks    Status  New      PT LONG TERM GOAL #3   Title  Pt will demo IND and technique with thoracolumbar strengthening exercises to return to patient handling with less strain on pelvic floor when working at ED department as EMT    Time  4    Period  Weeks    Status  New      PT LONG TERM GOAL #4   Title  Pt will demo decreased abdominal separation from 3 fingers above umbilicus to < 1 fingers width in order to increase intraabdominal pressure system for continence and decreased LBP    Time   6    Period  Weeks    Status  New    Target Date  03/13/20      PT LONG TERM GOAL #5   Title  Pt will demo increased hip ext B 5/5 in order to perform lifting. weight training and patient handling with less risk for lumbar injuries    Time  8    Period  Weeks    Status  New    Target Date  03/27/20      Additional Long Term Goals   Additional Long Term Goals  Yes      PT LONG TERM GOAL #6   Title  PT will increase his FOTO score for LBP from 69 pts to > 79pts in order to improve function    Time  10    Period  Weeks    Status  New    Target Date  04/10/20      PT LONG TERM GOAL #7   Title  Pt will report improved function on FOTO for Urinary Problems "Today, because of your urinary problem, do you orwould you have any difficulty at all with doinghousehold chores (cooking, cleaning, laundry)?" Moderate Difficulty to "no or little bit of difficulty"    Time  10    Period  Weeks    Status  New    Target Date  04/10/20             Plan - 01/31/20 1808    Clinical Impression Statement   Pt is a 59 yo who presents with SUI as a post-prostectomy Sx ( surgery 11/19/19) and chronic LBP. These deficits impact his QOL, fitness, and work activities.    Pt's musculoskeletal assessment revealed diastasis rectus, pelvic obliquities, limited spinal /pelvic mobility, dyscoordination and strength of pelvic floor mm, weak hip weakness, poor body mechanics which places strain on the abdominal/pelvic floor mm.  These are deficits that indicate an ineffective intraabdominal pressure system associated with increased risk for pt's Sx.    Pt performs many gravity-loaded tasks at work and home. Pt will benefit from proper coordination training and education on fitness and functional positions in order to yield greater outcomes. Advised pt to not perform sit-ups and crunches as these  movement patterns lead to more downward forces on her pelvic floor, negatively impacting abdominopelvic/spinal  dysfunctions.   Pt was provided education on etiology of Sx with anatomy, physiology explanation with images along with the benefits of customized pelvic PT Tx based on pt's medical conditions and musculoskeletal deficits.  Explained the physiology of deep core mm coordination and roles of pelvic floor function in urination, defecation, sexual function, and postural control with deep core mm system.   Following Tx today which pt tolerated without complaints, pt demo'd equal alignment of pelvic girdle and increased spinal mobility. Plan to treat diastasis recti at next session. Pt benefits from skilled PT.      Examination-Activity Limitations  Continence    Stability/Clinical Decision Making  Evolving/Moderate complexity    Rehab Potential  Good    PT Frequency  1x / week    PT Duration  Other (comment)   10   PT Treatment/Interventions  Balance training;Therapeutic exercise;Neuromuscular re-education;Therapeutic activities;Moist Heat;Patient/family education;Manual techniques    Consulted and Agree with Plan of Care  Patient       Patient will benefit from skilled therapeutic intervention in order to improve the following deficits and impairments:  Impaired sensation, Difficulty walking, Decreased mobility, Decreased endurance, Decreased coordination, Decreased safety awareness, Decreased knowledge of precautions, Decreased range of motion, Decreased skin integrity  Visit Diagnosis: Diastasis recti  Muscle weakness (generalized)     Problem List Patient Active Problem List   Diagnosis Date Noted  . Prostate cancer ([place]) 11/19/2019  . Osteoarthritis 11/14/2018  . Migraines 11/14/2018  . Anxiety and depression 11/14/2018  . HTN (hypertension) 11/14/2018  . Benign prostatic hyperplasia with urinary frequency 10/26/2016  . HLD (hyperlipidemia) 10/02/2015  . OSA (obstructive sleep apnea) 03/13/2015  . Seasonal allergies 03/13/2015  . GERD (gastroesophageal reflux disease)  03/13/2015  . DM type 2 (diabetes mellitus, type 2) ([place]) 10/30/2013    Jerl Mina ,PT, DPT, E-RYT  01/31/2020, 6:09 PM  [place] MAIN [facility] SERVICES [address] [place], [place], [address] Phone: [phone]   Fax:  [phone]  Name: KHYLE PEASTER MRN: [id number] Date of Birth: [birth year]

## 2020-01-31 NOTE — Addendum Note (Signed)
Addended by: Jerl Mina on: 01/31/2020 06:13 PM   Modules accepted: Orders

## 2020-02-05 ENCOUNTER — Ambulatory Visit (INDEPENDENT_AMBULATORY_CARE_PROVIDER_SITE_OTHER): Payer: No Typology Code available for payment source | Admitting: Internal Medicine

## 2020-02-05 ENCOUNTER — Encounter: Payer: Self-pay | Admitting: Internal Medicine

## 2020-02-05 ENCOUNTER — Ambulatory Visit (INDEPENDENT_AMBULATORY_CARE_PROVIDER_SITE_OTHER)
Admission: RE | Admit: 2020-02-05 | Discharge: 2020-02-05 | Disposition: A | Discharge status: Home / Self Care | Payer: No Typology Code available for payment source | Source: Ambulatory Visit | Attending: Internal Medicine | Admitting: Internal Medicine

## 2020-02-05 ENCOUNTER — Other Ambulatory Visit: Payer: Self-pay

## 2020-02-05 ENCOUNTER — Other Ambulatory Visit: Payer: Self-pay | Admitting: Internal Medicine

## 2020-02-05 VITALS — BP 138/90 | HR 82 | Temp 97.0°F | Ht 70.5 in | Wt 238.0 lb

## 2020-02-05 DIAGNOSIS — F329 Major depressive disorder, single episode, unspecified: Secondary | ICD-10-CM

## 2020-02-05 DIAGNOSIS — Z0000 Encounter for general adult medical examination without abnormal findings: Secondary | ICD-10-CM

## 2020-02-05 DIAGNOSIS — M159 Polyosteoarthritis, unspecified: Secondary | ICD-10-CM

## 2020-02-05 DIAGNOSIS — K219 Gastro-esophageal reflux disease without esophagitis: Secondary | ICD-10-CM | POA: Diagnosis not present

## 2020-02-05 DIAGNOSIS — C61 Malignant neoplasm of prostate: Secondary | ICD-10-CM | POA: Diagnosis not present

## 2020-02-05 DIAGNOSIS — E119 Type 2 diabetes mellitus without complications: Secondary | ICD-10-CM

## 2020-02-05 DIAGNOSIS — F419 Anxiety disorder, unspecified: Secondary | ICD-10-CM

## 2020-02-05 DIAGNOSIS — M25531 Pain in right wrist: Secondary | ICD-10-CM | POA: Diagnosis not present

## 2020-02-05 DIAGNOSIS — R350 Frequency of micturition: Secondary | ICD-10-CM

## 2020-02-05 DIAGNOSIS — I10 Essential (primary) hypertension: Secondary | ICD-10-CM

## 2020-02-05 DIAGNOSIS — N401 Enlarged prostate with lower urinary tract symptoms: Secondary | ICD-10-CM

## 2020-02-05 DIAGNOSIS — G4733 Obstructive sleep apnea (adult) (pediatric): Secondary | ICD-10-CM

## 2020-02-05 DIAGNOSIS — M150 Primary generalized (osteo)arthritis: Secondary | ICD-10-CM

## 2020-02-05 DIAGNOSIS — F32A Depression, unspecified: Secondary | ICD-10-CM

## 2020-02-05 DIAGNOSIS — G43C1 Periodic headache syndromes in child or adult, intractable: Secondary | ICD-10-CM

## 2020-02-05 DIAGNOSIS — E782 Mixed hyperlipidemia: Secondary | ICD-10-CM

## 2020-02-05 DIAGNOSIS — E1169 Type 2 diabetes mellitus with other specified complication: Secondary | ICD-10-CM

## 2020-02-05 DIAGNOSIS — Z0001 Encounter for general adult medical examination with abnormal findings: Secondary | ICD-10-CM

## 2020-02-05 DIAGNOSIS — M8949 Other hypertrophic osteoarthropathy, multiple sites: Secondary | ICD-10-CM

## 2020-02-05 LAB — COMPREHENSIVE METABOLIC PANEL
ALT: 14 U/L (ref 0–53)
AST: 18 U/L (ref 0–37)
Albumin: 4.5 g/dL (ref 3.5–5.2)
Alkaline Phosphatase: 81 U/L (ref 39–117)
BUN: 12 mg/dL (ref 6–23)
CO2: 30 mEq/L (ref 19–32)
Calcium: 9.3 mg/dL (ref 8.4–10.5)
Chloride: 102 mEq/L (ref 96–112)
Creatinine, Ser: 0.95 mg/dL (ref 0.40–1.50)
GFR: 98.99 mL/min (ref >60.00)
Glucose, Bld: 134 mg/dL — ABNORMAL HIGH (ref 70–99)
Potassium: 4.3 mEq/L (ref 3.5–5.1)
Sodium: 138 mEq/L (ref 135–145)
Total Bilirubin: 0.3 mg/dL (ref 0.2–1.2)
Total Protein: 7.2 g/dL (ref 6.0–8.3)

## 2020-02-05 LAB — HEMOGLOBIN A1C: Hgb A1c MFr Bld: 7.2 % — ABNORMAL HIGH (ref 4.6–6.5)

## 2020-02-05 LAB — LIPID PANEL
Cholesterol: 157 mg/dL (ref 0–200)
HDL: 47.1 mg/dL (ref >39.00)
LDL Cholesterol: 95 mg/dL (ref 0–99)
NonHDL: 109.8
Total CHOL/HDL Ratio: 3
Triglycerides: 74 mg/dL (ref 0.0–149.0)
VLDL: 14.8 mg/dL (ref 0.0–40.0)

## 2020-02-05 LAB — CBC
HCT: 42.7 % (ref 39.0–52.0)
Hemoglobin: 14 g/dL (ref 13.0–17.0)
MCHC: 32.9 g/dL (ref 30.0–36.0)
MCV: 81.6 fl (ref 78.0–100.0)
Platelets: 263 10*3/uL (ref 150.0–400.0)
RBC: 5.23 Mil/uL (ref 4.22–5.81)
RDW: 15.4 % (ref 11.5–15.5)
WBC: 4 10*3/uL (ref 4.0–10.5)

## 2020-02-05 LAB — PSA: PSA: 0 ng/mL — ABNORMAL LOW (ref 0.10–4.00)

## 2020-02-05 MED ORDER — BUTALBITAL-APAP-CAFFEINE 50-325-40 MG PO TABS: 1.0000 | ORAL_TABLET | Freq: Four times a day (QID) | ORAL | 0 refills | Status: DC | PRN

## 2020-02-05 MED ORDER — BUPROPION HCL ER (XL) 150 MG PO TB24: 150.0000 mg | ORAL_TABLET | Freq: Every day | ORAL | 2 refills | Status: DC

## 2020-02-05 MED FILL — buPROPion HCL ER (XL) 150 M: 150 | 30 days supply | Qty: 30 | Fill #0

## 2020-02-05 MED FILL — BUTALB-ACETAMIN-CAFF 50-325: 50-325-40 | 3 days supply | Qty: 20 | Fill #0

## 2020-02-05 NOTE — Assessment & Plan Note (Signed)
CBC and CMET today Avoid greasy foods Will monitor

## 2020-02-05 NOTE — Assessment & Plan Note (Signed)
Deteriorated likely due to stress RX for Fioricet prn  Update me and let me know if this medication is effective

## 2020-02-05 NOTE — Assessment & Plan Note (Signed)
A1C today He needs urine microalbumin but I forgot to order Encouraged her to consume a low carb diet, exercise for weight loss Continue Metformin, monitor sugars Encouraged routine foot exam Will obtain copy of eye exam Flu and pneumovax UTD

## 2020-02-05 NOTE — Patient Instructions (Signed)

## 2020-02-05 NOTE — Assessment & Plan Note (Signed)
CMET and lipid profile today °Encouraged him to consume a low fat diet °Continue Atorvastatin °

## 2020-02-05 NOTE — Assessment & Plan Note (Signed)
Encouraged weight loss, CPAP compliance He can not take anything strong for sleep as he has to be available to his wife if she needs him Advised him he could take 2 tabs of Hydroxyzine prior to bedtime if needed.

## 2020-02-05 NOTE — Assessment & Plan Note (Signed)
Resolved per his report

## 2020-02-05 NOTE — Assessment & Plan Note (Signed)
Encouraged regular physical activity and weight loss Continue Ibuprofen as needed

## 2020-02-05 NOTE — Progress Notes (Signed)
Subjective:    Patient ID: Marc Aguilar, male    DOB: 12/17/1960, 59 y.o.   MRN: [id number]  HPI  Pt presents to the clinic today for her annual exam. He is also due to follow up chronic conditions.  OA: Mainly in his knees and hips. He takes Ibuprofen as needed with some relief.  GERD: Triggered by greasy foods but really not an issue.  He is not taking any medication OTC for this.  Migraines: These occur more frequently lately due to increased stress. He takes Excedrin Migraine with relief intermittently. He does not follow with neurology.   OSA: He averages 6 hours of sleep, intermittently using his CPAP. He does not feel rested when he wakes up. He has trouble falling asleep and staying asleep. Sleep study from 12/2016 reviewed.  HLD: His last LDL was 90, triglycerides 105, 1/21. He denies myalgias on Atorvastatin a but not the Fish Oil. He tries to eat a low fat diet.  DM 2: His last A1C was 7.8%, 11/2019. His sugars range 120- 250+. He is taking Metformin as prescribed. He checks his feet daily. His last eye exam was 08/2019.  Anxiety and Depression: Triggered by his health issues, being a caregiver for his wife. He is taking Citalopram and Hydroxyzine as prescribed. He is seeing EAP. He denies SI/HI.  HTN: His BP today is 138/90. He is taking Amlodipine as prescribed. ECG from 02/2018 reviewed.  Hx of Prostate Ca, BPH: s/p prostatectomy, no chemo or radiation. He is no longer taking the Oxybutynin. He takes the Sildenafil as needed.  He also c/o a knot to is right forearm and right wrist pain. He noticed this after 2 falls in the last month. He describes the pain as stiffness. He has not noticed any numbness or tingling. He has taken Ibuprofen with minimal relief.   Flu: 07/2019 Tetanus; 10/2016 Pneumovax: 10/2018 Covid: 09/2019, 10/2019 PSA Screening: 12/2019 Colon Screening: 2015, normal Vision Screening: annually Dentist: biannually  Diet: He does eat meat. He consumes  fruits and veggies daily. He tries to avoid fried foods. She drinks mostly water. Exercise: None   Review of Systems      Past Medical History:  Diagnosis Date  . Anxiety   . Arthritis    "knees and hips" (02/28/2018)  . Cancer [facility])    prostate   . Chicken pox   . Depression   . GERD (gastroesophageal reflux disease)   . Heart murmur   . Hernia, abdominal   . High cholesterol   . History of gout   . Migraine    "probably monthly" (02/28/2018)  . OSA on CPAP   . Pneumonia    "once" (02/28/2018)  . Refusal of blood transfusions as patient is Jehovah's Witness   . Seasonal allergies   . Type II diabetes mellitus ([place])     Current Outpatient Medications  Medication Sig Dispense Refill  . ACCU-CHEK FASTCLIX LANCETS MISC USE TWICE A DAY AS DIRECTED 204 each 3  . albuterol (VENTOLIN HFA) 108 (90 Base) MCG/ACT inhaler Inhale 2 puffs into the lungs every 6 (six) hours as needed for wheezing or shortness of breath. 1 Inhaler 0  . Alcohol Swabs (ALCOHOL PREP) PADS 1 each by Does not apply route 2 (two) times daily. 200 each 3  . amLODipine (NORVASC) 5 MG tablet TAKE 1 TABLET (5 MG TOTAL) BY MOUTH DAILY (Patient taking differently: Take 5 mg by mouth daily. ) 90 tablet 3  . aspirin EC  81 MG tablet Take 81 mg by mouth daily.    Marland Kitchen atorvastatin (LIPITOR) 10 MG tablet TAKE 1 TABLET BY MOUTH DAILY. (Patient taking differently: Take 10 mg by mouth daily. ) 30 tablet 1  . citalopram (CELEXA) 40 MG tablet Take 1 tablet (40 mg total) by mouth daily. 30 tablet 3  . glucose blood test strip TEST BLOOD SUGAR AS DIRECTED TWICE DAILY 200 each 3  . hydroxypropyl methylcellulose / hypromellose (ISOPTO TEARS / GONIOVISC) 2.5 % ophthalmic solution Place 1 drop into both eyes as needed for dry eyes.    . hydrOXYzine (ATARAX/VISTARIL) 25 MG tablet TAKE 1 TABLET (25 MG TOTAL) BY MOUTH AT BEDTIME AS NEEDED. 30 tablet 0  . metFORMIN (GLUCOPHAGE) 1000 MG tablet TAKE 1 TABLET BY MOUTH TWICE A DAY WITH A MEAL  (Patient taking differently: Take 1,000 mg by mouth 2 (two) times daily with a meal. ) 180 tablet 3  . Multiple Vitamins-Minerals (MULTIVITAMIN WITH MINERALS) tablet Take 1 tablet by mouth daily.    Marland Kitchen omega-3 acid ethyl esters (LOVAZA) 1 g capsule Take 1 g by mouth daily.    Marland Kitchen oxybutynin (DITROPAN) 5 MG tablet Take 1 tablet (5 mg total) by mouth every 8 (eight) hours as needed for bladder spasms. 30 tablet 0  . oxyCODONE-acetaminophen (PERCOCET/ROXICET) 5-325 MG tablet Take 1-2 tablets by mouth every 6 (six) hours as needed for moderate pain. 20 tablet 0  . sildenafil (REVATIO) 20 MG tablet Take 1 tablet (20 mg total) by mouth as needed. Take 1-5 tabs as needed prior to intercourse 30 tablet 11   No current facility-administered medications for this visit.    Allergies  Allergen Reactions  . Tape Rash    Paper tape-blisters    Family History  Problem Relation Age of Onset  . Diabetes Mother   . Hyperlipidemia Mother   . Bladder Cancer Father   . Liver cancer Sister   . Diabetes Sister   . Diabetes Maternal Aunt   . Arthritis Maternal Grandmother   . Diabetes Maternal Grandmother   . Arthritis Maternal Grandfather   . Arthritis Paternal Grandmother   . Diabetes Brother   . Heart disease Brother   . Colon cancer Neg Hx     Social History   Socioeconomic History  . Marital status: Married    Spouse name: Not on file  . Number of children: 2  . Years of education: Not on file  . Highest education level: Not on file  Occupational History  . 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 Smoking status: Former Smoker    Packs/day: 1.00    Years: 27.00    Pack years: 27.00    Types: Cigarettes    Quit date: 10/18/2009    Years since quitting: 10.3  . Smokeless tobacco: Former Systems developer    Types: [place] date: 10/18/1994  Substance and Sexual Activity  . Alcohol use: Yes    Alcohol/week: 2.0 standard drinks    Types: 2 Cans of beer per week  . Drug use: No  . Sexual activity:  Not Currently  Other Topics Concern  . Not on file  Social History Narrative  . Not on file   Social Determinants of Health   Financial Resource Strain:   . Difficulty of Paying Living Expenses:   Food Insecurity:   . Worried About Charity fundraiser in the Last Year:   . [place] in the Last Year:  Transportation Needs:   . Film/video editor (Medical):   Marland Kitchen Lack of Transportation (Non-Medical):   Physical Activity:   . Days of Exercise per Week:   . Minutes of Exercise per Session:   Stress:   . Feeling of Stress :   Social Connections:   . Frequency of Communication with Friends and Family:   . Frequency of Social Gatherings with Friends and Family:   . Attends Religious Services:   . Active Member of Clubs or Organizations:   . Attends Archivist Meetings:   Marland Kitchen Marital Status:   Intimate Partner Violence:   . Fear of Current or Ex-Partner:   . Emotionally Abused:   Marland Kitchen Physically Abused:   . Sexually Abused:      Constitutional: Denies fever, malaise, fatigue, headache or abrupt weight changes.  HEENT: Denies eye pain, eye redness, ear pain, ringing in the ears, wax buildup, runny nose, nasal congestion, bloody nose, or sore throat. Respiratory: Denies difficulty breathing, shortness of breath, cough or sputum production.   Cardiovascular: Denies chest pain, chest tightness, palpitations or swelling in the hands or feet.  Gastrointestinal: Denies abdominal pain, bloating, constipation, diarrhea or blood in the stool.  GU: Denies urgency, frequency, pain with urination, burning sensation, blood in urine, odor or discharge. Musculoskeletal: Pt reports intermittent joint pain,right wrist pain. Denies decrease in range of motion, difficulty with gait, muscle pain or joint swelling.  Skin: Pt reports dry skin on feet. Denies redness, rashes, lesions or ulcercations.  Neurological: Pt reports insomnia. Denies dizziness, difficulty with memory, difficulty  with speech or problems with balance and coordination.  Psych: Pt reports anxiety and depression. Denies SI/HI.  No other specific complaints in a complete review of systems (except as listed in HPI above).  Objective:   Physical Exam    BP 138/90   Pulse 82   Temp (!) 97 F (36.1 C) (Temporal)   Ht 5' 10.5" (1.791 m)   Wt 238 lb (108 kg)   SpO2 98%   BMI 33.67 kg/m   Wt Readings from Last 3 Encounters:  02/05/20 238 lb (108 kg)  12/19/19 239 lb (108.4 kg)  11/26/19 244 lb (110.7 kg)    General: Appears his stated age, well developed, well nourished in NAD. Skin: Warm, dry and intact. Dry skin noted on right forearm. Some folliculitis noted of beard. HEENT: Head: normal shape and size; Eyes: sclera white, no icterus, conjunctiva pink, PERRLA and EOMs intact;  Neck:  Neck supple, trachea midline. No masses, lumps or thyromegaly present.  Cardiovascular: Normal rate and rhythm. S1,S2 noted.  No murmur, rubs or gallops noted. No JVD or BLE edema. No carotid bruits noted. Pulmonary/Chest: Normal effort and positive vesicular breath sounds. No respiratory distress. No wheezes, rales or ronchi noted.  Abdomen: Soft and nontender. Normal bowel sounds. No distention or masses noted. Liver, spleen and kidneys non palpable. Musculoskeletal: Normal flexion, extension and rotation of the right wrist. Knot noted over the mid ulna. Pain with palpation of the right carpals. Strength 5/5 BUE/BLE.No difficulty with gait.  Neurological: Alert and oriented. Cranial nerves II-XII grossly intact. Coordination normal.  Psychiatric: Mood and affect normal. Behavior is normal. Judgment and thought content normal.     BMET    Component Value Date/Time   NA 138 11/20/2019 0445   K 4.2 11/20/2019 0445   CL 102 11/20/2019 0445   CO2 26 11/20/2019 0445   GLUCOSE 148 (H) 11/20/2019 0445   BUN 14 11/20/2019 0445  CREATININE 0.89 11/20/2019 0445   CALCIUM 8.9 11/20/2019 0445   GFRNONAA >60  11/20/2019 0445   GFRAA >60 11/20/2019 0445    Lipid Panel     Component Value Date/Time   CHOL 157 10/26/2019 1203   TRIG 105 10/26/2019 1203   HDL 48 10/26/2019 1203   CHOLHDL 3.3 10/26/2019 1203   CHOLHDL 4 11/14/2018 0935   VLDL 12.4 11/14/2018 0935   LDLCALC 90 10/26/2019 1203    CBC    Component Value Date/Time   WBC 4.5 11/16/2019 1154   RBC 5.70 11/16/2019 1154   HGB 13.0 11/20/2019 0445   HCT 39.8 11/20/2019 0445   PLT 237 11/16/2019 1154   MCV 79.6 (L) 11/16/2019 1154   MCH 25.8 (L) 11/16/2019 1154   MCHC 32.4 11/16/2019 1154   RDW 14.5 11/16/2019 1154   LYMPHSABS 3.0 02/28/2018 0300   MONOABS 0.4 02/28/2018 0300   EOSABS 0.2 02/28/2018 0300   BASOSABS 0.0 02/28/2018 0300    Hgb A1C Lab Results  Component Value Date   HGBA1C 7.8 (H) 11/19/2019        Assessment & Plan:   Preventative Health Maintenance:  Encouraged him to get a flu shot in the fall Tetanus UTD Pneumovax UTD Covid Vaccine UTD Colon screening UTD Encouraged her to consume a balanced diet and exercise regimen Advised him to see an eye doctor and dentist annually Will check CBC, CMET, Lipid, A1C and PSA  Knot of Right Forearm, Right Wrist Pain:  Xray right forearm today (will capture the wrist)  Will follow up after xray, RTC in 3 months, sooner if needed Webb Silversmith, NP This visit occurred during the SARS-CoV-2 public health emergency.  Safety protocols were in place, including screening questions prior to the visit, additional usage of staff PPE, and extensive cleaning of exam room while observing appropriate contact time as indicated for disinfecting solutions.

## 2020-02-05 NOTE — Assessment & Plan Note (Signed)
Controlled on Amlodipine CMET today

## 2020-02-05 NOTE — Assessment & Plan Note (Signed)
PSA today per his request Oxybutnin d/c'd due to nonuse He will continue to follow with urology

## 2020-02-05 NOTE — Assessment & Plan Note (Signed)
Persistent Continue Citalopram Will add Wellbutrin, RX sent to pharmacy Takes Hydroxyzine as needed for sleep Support offered

## 2020-02-06 ENCOUNTER — Other Ambulatory Visit: Payer: Self-pay | Admitting: Internal Medicine

## 2020-02-06 MED ORDER — LANCETS MISC: 1.0000 | Freq: Two times a day (BID) | 2 refills | Status: DC

## 2020-02-06 MED ORDER — FREESTYLE LITE DEVI: 1.0000 | Freq: Once | 0 refills | Status: AC

## 2020-02-06 MED ORDER — FREESTYLE LITE TEST VI STRP: 1.0000 | ORAL_STRIP | Freq: Two times a day (BID) | 2 refills | Status: DC

## 2020-02-06 MED FILL — FREESTYLE LITE TEST STRIP: 75 days supply | Qty: 150 | Fill #0

## 2020-02-06 MED FILL — FREESTYLE LANCETS: 50 days supply | Qty: 100 | Fill #0

## 2020-02-06 MED FILL — FREESTYLE LITE METER: 1 days supply | Qty: 1 | Fill #0

## 2020-02-06 NOTE — Addendum Note (Signed)
Addended by: Lurlean Nanny on: 02/06/2020 01:53 PM   Modules accepted: Orders

## 2020-02-07 ENCOUNTER — Other Ambulatory Visit: Payer: Self-pay

## 2020-02-07 ENCOUNTER — Ambulatory Visit: Payer: No Typology Code available for payment source | Admitting: Physical Therapy

## 2020-02-07 DIAGNOSIS — M6208 Separation of muscle (nontraumatic), other site: Secondary | ICD-10-CM

## 2020-02-07 DIAGNOSIS — M6281 Muscle weakness (generalized): Secondary | ICD-10-CM

## 2020-02-07 NOTE — Patient Instructions (Addendum)
Maintain stretches from last session Throughout the day      In terms of pelvic floor:  only do this for this week  2 x day   Deep core level 1 ( with quick squeeze and not with ab muscles)  Soft inhale    Deep core level 2 ( no pelvic squeeze)   6 min

## 2020-02-07 NOTE — Therapy (Signed)
[place] MAIN [facility] SERVICES [address] [place], [place], [address] Phone: [phone]   Fax:  [phone]  Physical Therapy Treatment  Patient Details  Name: Marc Aguilar MRN: [id number] Date of Birth: 1961-05-04 Referring Provider (PT): Erlene Quan   Encounter Date: 02/07/2020  PT End of Session - 02/07/20 1116    Visit Number  2    Number of Visits  10    Date for PT Re-Evaluation  04/08/20    PT Start Time  1030    PT Stop Time  1105    PT Time Calculation (min)  35 min    Activity Tolerance  Patient tolerated treatment well    Behavior During Therapy  [facility] for tasks assessed/performed       Past Medical History:  Diagnosis Date  . Anxiety   . Arthritis    "knees and hips" (02/28/2018)  . Cancer [facility])    prostate   . Chicken pox   . Depression   . GERD (gastroesophageal reflux disease)   . Heart murmur   . Hernia, abdominal   . High cholesterol   . History of gout   . Migraine    "probably monthly" (02/28/2018)  . OSA on CPAP   . Pneumonia    "once" (02/28/2018)  . Refusal of blood transfusions as patient is Jehovah's Witness   . Seasonal allergies   . Type II diabetes mellitus ([place])     Past Surgical History:  Procedure Laterality Date  . CARDIAC CATHETERIZATION  02/28/2018  . LEFT HEART CATH AND CORONARY ANGIOGRAPHY N/A 02/28/2018   Procedure: LEFT HEART CATH AND CORONARY ANGIOGRAPHY;  Surgeon: Sherren Mocha, MD;  Location: [place] CV LAB;  Service: Cardiovascular;  Laterality: N/A;  . ROBOT ASSISTED LAPAROSCOPIC RADICAL PROSTATECTOMY N/A 11/19/2019   Procedure: XI ROBOTIC ASSISTED LAPAROSCOPIC RADICAL PROSTATECTOMY WITH LYMPH NODE DISSECTION;  Surgeon: Hollice Espy, MD;  Location: [facility] ORS;  Service: Urology;  Laterality: N/A;  . WRIST GANGLION EXCISION Left 1984    There were no vitals filed for this visit.  Subjective Assessment - 02/07/20 1029    Subjective  Pt reported feeling pain in the pelvic and  abdominal muscles after performing over 20 reps twice back to back but stopped due to the pain. Pain subsided. Denied difficulty with eliminating urine. Pt has not done any of the back stretches except for the openbook.  Pt will be applying for FMLA for caring for wife who just had a hip replacement after fall  and brother in law is blind with disability who lives with him.         [facility] PT Assessment - 02/07/20 1045      Palpation   Spinal mobility  decreased paraspinal mm tightness     SI assessment   iliac crest levelled                 Pelvic Floor Special Questions - 02/07/20 1102    Diastasis Recti  less depth below sternocostal angle , bulging present above umbilicus     External Perineal Exam  quick contraction with ab overuse. 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  Strategized abbreviated HEP for compliance, answered questions about crunches and situps, explaiend not overdoing pelvic floor contraction and not overusing ab muscles       Neuro Re-ed    Neuro Re-ed Details   excessive cues for pelvic floor contraction and less ab overus               PT Short Term Goals - 11/13/19 1502      PT SHORT TERM GOAL #1   Title  Pt will demo decreased abdominal bulging to improve intraabdominal pressure for improved outcome for continence post op ( 1/26: 1 finger width separation)    Time  2    Period  Weeks    Status  Achieved    Target Date  11/21/19      PT SHORT TERM GOAL #2   Title  Pt will demo proper body mechanics to minimize strain of abdominal/ spine/ pelvic floor    Time  1    Period  Weeks    Status  Achieved    Target Date  11/14/19      PT SHORT TERM GOAL  #3   Title  Pt will demo IND with deep core/ pelvic floor coordination, strengthening    Time  2    Period  Weeks    Status  Achieved    Target Date  11/21/19        PT Long Term Goals - 01/31/20 1352      PT LONG TERM GOAL #1   Title  Pt will decrease urinary pads from 2-3 pads / day to < 1 pad a day in order to perform work duties    Time  8    Period  Weeks    Status  New      PT LONG TERM GOAL #2   Title  Pt will be IND with a fitness routine Scientist, research (physical sciences), pilates chair, resistance bands, dumbbells, treadmill)  in order to return to wellness and conditioning.    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 weight training and patient handling with less risk for lumbar injuries    Time  8    Period  Weeks    Status  New    Target Date  03/27/20      Additional Long Term Goals   Additional Long Term Goals  Yes      PT LONG TERM GOAL #6   Title  PT will increase his FOTO score for LBP from 69 pts to > 79pts in order  to improve function    Time  10    Period  Weeks    Status  New    Target Date  04/10/20      PT LONG TERM GOAL #7   Title  Pt will report improved function on FOTO for Urinary Problems "Today, because of your urinary problem, do you orwould you have any difficulty at all with doinghousehold chores (cooking, cleaning,  laundry)?" Moderate Difficulty to "no or little bit of difficulty"    Time  10    Period  Weeks    Status  New    Target Date  04/10/20            Plan - 02/07/20 1117    Clinical Impression Statement Pt arrived 30 min late and thus, session was abbreviated. Pt was provided active listening to pt's stressors in current schedule as a caregiver to wife and brother in law. Thus, strategized simpler HEP for pt's compliance and schedule. Provided education to not overdo pelvic floor contractions and why sit up and crunches are not warranted for pelvic health and their impact on his abdominal separation. Pt voiced understanding  Abdominal bulging and diaphragmatic expansion are improving. Pelvic alignment and decreased back mm tightness have improved as well from last session.   Pt required excessive cues for quick pelvic floor contractions with less ab overuse and for technique for deep core exercises. Pt continues to benefit from skilled PT.    Examination-Activity Limitations  Continence    Stability/Clinical Decision Making  Evolving/Moderate complexity    Rehab Potential  Good    PT Frequency  1x / week    PT Duration  Other (comment)   10   PT Treatment/Interventions  Balance training;Therapeutic exercise;Neuromuscular re-education;Therapeutic activities;Moist Heat;Patient/family education;Manual techniques    Consulted and Agree with Plan of Care  Patient       Patient will benefit from skilled therapeutic intervention in order to improve the following deficits and impairments:  Impaired sensation, Difficulty walking, Decreased mobility, Decreased endurance, Decreased coordination, Decreased safety awareness, Decreased knowledge of precautions, Decreased range of motion, Decreased skin integrity  Visit Diagnosis: Muscle weakness (generalized)  Diastasis recti     Problem List Patient Active Problem List   Diagnosis Date Noted  . Prostate cancer ([place]) 11/19/2019  .  Osteoarthritis 11/14/2018  . Migraines 11/14/2018  . Anxiety and depression 11/14/2018  . HTN (hypertension) 11/14/2018  . Benign prostatic hyperplasia with urinary frequency 10/26/2016  . HLD (hyperlipidemia) 10/02/2015  . OSA (obstructive sleep apnea) 03/13/2015  . Seasonal allergies 03/13/2015  . GERD (gastroesophageal reflux disease) 03/13/2015  . Diabetes mellitus ([place]) 10/30/2013    Jerl Mina ,PT, DPT, E-RYT  02/07/2020, 12:39 PM  [place] MAIN [facility] SERVICES [address] [place], [place], [address] Phone: [phone]   Fax:  [phone]  Name: Marc Aguilar MRN: [id number] Date of Birth: 26-Nov-1960

## 2020-02-08 ENCOUNTER — Encounter: Payer: No Typology Code available for payment source | Admitting: Internal Medicine

## 2020-02-19 ENCOUNTER — Ambulatory Visit: Payer: No Typology Code available for payment source | Attending: Urology | Admitting: Physical Therapy

## 2020-02-19 ENCOUNTER — Other Ambulatory Visit: Payer: Self-pay

## 2020-02-19 DIAGNOSIS — M533 Sacrococcygeal disorders, not elsewhere classified: Secondary | ICD-10-CM | POA: Diagnosis not present

## 2020-02-19 DIAGNOSIS — M6208 Separation of muscle (nontraumatic), other site: Secondary | ICD-10-CM | POA: Insufficient documentation

## 2020-02-19 DIAGNOSIS — M6281 Muscle weakness (generalized): Secondary | ICD-10-CM | POA: Insufficient documentation

## 2020-02-19 NOTE — Patient Instructions (Addendum)
Multifidis twist  X 15   2 x day  Band is on doorknob: sit further away from door (facing perpendicular)   Twisting trunk without moving the hips and knees Hold band at the level of ribcage, elbows bent,shoulder blades roll back and down like squeezing a pencil under armpit   Inhale soft, not into chest,   Exhale twist,.10-15 deg away from door without moving your hips/ knees. Continue to maintain equal weight through legs. Keep knee unlocked.        __________________________  Oblique/ scapula stabilization  X 2 x day   Opposite arm   Place band in "U"    band under ballmounds  while laying on back w/ knees bent     20 reps  on each side  Holding band from opposite thigh,  Inhale,    exhale then pull band across body while keeping elbow , shoulders, back of the head pressed down    ______________      PELVIC FLOOR / KEGEL EXERCISES   Pelvic floor/ Kegel exercises are used to strengthen the muscles in the base of your pelvis that are responsible for supporting your pelvic organs and preventing urine/feces leakage. Based on your therapist's recommendations, they can be performed while standing, sitting, or lying down.  Make yourself aware of this muscle group by using these cues:  Imagine you are in a crowded room and you feel the need to pass gas. Your response is to pull up and in at the rectum.  Close the rectum. Pull the muscles up inside your body,feeling your scrotum lifting as well . Feel the pelvic floor muscles lift as if you were walking into a cold lake.  Place your hand on top of your pubic bone. Tighten and draw in the muscles around the anal muscles without squeezing the buttock muscles.  Common Errors:  Breath holding: If you are holding your breath, you may be bearing down against your bladder instead of pulling it up. If you belly bulges up while you are squeezing, you are holding your breath. Be sure to breathe gently in and out while exercising.  Counting out loud may help you avoid holding your breath.  Accessory muscle use: You should not see or feel other muscle movement when performing pelvic floor exercises. When done properly, no one can tell that you are performing the exercises. Keep the buttocks, belly and inner thighs relaxed.  Overdoing it: Your muscles can fatigue and stop working for you if you over-exercise. You may actually leak more or feel soreness at the lower abdomen or rectum.  YOUR HOME EXERCISE PROGRAM  LONG HOLDS: Position: on back  Inhale and then exhale. Then squeeze the muscle and count aloud for 3 seconds. Rest with three long breaths. (Be sure to let belly sink in with exhales and not push outward)  Perform   repetitions, _3_  different times/day    ____   .

## 2020-02-20 NOTE — Therapy (Addendum)
[place] MAIN [facility] SERVICES [address] [place], [place], [address] Phone: [phone]   Fax:  [phone]  Physical Therapy Treatment  Patient Details  Name: Marc Aguilar MRN: [id number] Date of Birth: 02-08-1961 Referring Provider (PT): Erlene Quan   Encounter Date: 02/19/2020  PT End of Session - 02/20/20 0818    Visit Number  3    Number of Visits  10    Date for PT Re-Evaluation  04/08/20    PT Start Time  [id number]    PT Stop Time  1600    PT Time Calculation (min)  49 min    Activity Tolerance  Patient tolerated treatment well    Behavior During Therapy  [facility] for tasks assessed/performed       Past Medical History:  Diagnosis Date  . Anxiety   . Arthritis    "knees and hips" (02/28/2018)  . Cancer [facility])    prostate   . Chicken pox   . Depression   . GERD (gastroesophageal reflux disease)   . Heart murmur   . Hernia, abdominal   . High cholesterol   . History of gout   . Migraine    "probably monthly" (02/28/2018)  . OSA on CPAP   . Pneumonia    "once" (02/28/2018)  . Refusal of blood transfusions as patient is Jehovah's Witness   . Seasonal allergies   . Type II diabetes mellitus ([place])     Past Surgical History:  Procedure Laterality Date  . CARDIAC CATHETERIZATION  02/28/2018  . LEFT HEART CATH AND CORONARY ANGIOGRAPHY N/A 02/28/2018   Procedure: LEFT HEART CATH AND CORONARY ANGIOGRAPHY;  Surgeon: Sherren Mocha, MD;  Location: [place] CV LAB;  Service: Cardiovascular;  Laterality: N/A;  . ROBOT ASSISTED LAPAROSCOPIC RADICAL PROSTATECTOMY N/A 11/19/2019   Procedure: XI ROBOTIC ASSISTED LAPAROSCOPIC RADICAL PROSTATECTOMY WITH LYMPH NODE DISSECTION;  Surgeon: Hollice Espy, MD;  Location: [facility] ORS;  Service: Urology;  Laterality: N/A;  . WRIST GANGLION EXCISION Left 1984    There were no vitals filed for this visit.  Subjective Assessment - 02/19/20 1514    Subjective  Pt reported he has noticed he is having more  bowel movements from 1 x day to 2-3 x day. Pt's urinary leakage is geting better. Pt has decreased his pad changes from 3-4 x day to 2-3 x day.         [facility] PT Assessment - 02/20/20 0821      Coordination   Coordination and Movement Description  chest breathing, scapular depression / retraction, poor coordination/ diassociation between trunk                Pelvic Floor Special Questions - 02/20/20 0821    Diastasis Recti  2 fingers width below sternum, no fingers width above umbilicus      External Perineal Exam  hooklying:  3 sec, 4 reps         [facility] Adult PT Treatment/Exercise - 02/19/20 1541      Neuro Re-ed    Neuro Re-ed Details   cued for less chest breathing, scapular depression / retraction, coordination/ diassociatio between trunk       Manual Therapy   Manual therapy comments  STM/MWM lateral and anterior intercostals to promote depression, fascial glide for DRA closure,     Kinesiotex  Facilitate Muscle      Kinesiotix   Facilitate Muscle   closure below  PT Short Term Goals - 11/13/19 1502      PT SHORT TERM GOAL #1   Title  Pt will demo decreased abdominal bulging to improve intraabdominal pressure for improved outcome for continence post op ( 1/26: 1 finger width separation)    Time  2    Period  Weeks    Status  Achieved    Target Date  11/21/19      PT SHORT TERM GOAL #2   Title  Pt will demo proper body mechanics to minimize strain of abdominal/ spine/ pelvic floor    Time  1    Period  Weeks    Status  Achieved    Target Date  11/14/19      PT SHORT TERM GOAL #3   Title  Pt will demo IND with deep core/ pelvic floor coordination, strengthening    Time  2    Period  Weeks    Status  Achieved    Target Date  11/21/19        PT Long Term Goals - 01/31/20 1352      PT LONG TERM GOAL #1   Title  Pt will decrease urinary pads from 2-3 pads / day to < 1 pad a day in order to perform work duties    Time  8     Period  Weeks    Status  New      PT LONG TERM GOAL #2   Title  Pt will be IND with a fitness routine Scientist, research (physical sciences), pilates chair, resistance bands, dumbbells, treadmill)  in order to return to wellness and conditioning.    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 weight training and patient handling with less risk for lumbar injuries    Time  8    Period  Weeks    Status  New    Target Date  03/27/20      Additional Long Term Goals   Additional Long Term Goals  Yes      PT LONG TERM GOAL #6   Title  PT will increase his FOTO score for LBP from 69 pts to > 79pts in order to improve function    Time  10    Period  Weeks    Status  New    Target Date  04/10/20      PT LONG TERM GOAL #7   Title  Pt will report improved function on FOTO for Urinary Problems "Today, because of your urinary problem, do you orwould you have any  difficulty at all with doinghousehold chores (cooking, cleaning, laundry)?" Moderate Difficulty to "no or little bit of difficulty"    Time  10    Period  Weeks    Status  New    Target Date  04/10/20            Plan - 02/20/20 0819    Clinical Impression Statement  Pt has decreased his use of pads from 3-4 pads to 2-3 pads and progressed to endurance contraction s today in anti gravity position. Pt continues to  perform seated quick contractions. Advanced pt to with scapular/ RTC strengthening and multifidis strengthening in order to help continue minimize DRA and for pt be prepared to weight lifting and patient handling at this job.  Pt required cues for chest breathing, scapular depression / retraction, coordination/ diassociation between trunk.  Pt continues to benefit from skilled PT. Pt arrived 10 min and therefore, session was abbreviated.    Examination-Activity Limitations  Continence    Stability/Clinical Decision Making  Evolving/Moderate complexity    Rehab Potential  Good    PT Frequency  1x / week    PT Duration  Other (comment)   10   PT Treatment/Interventions  Balance training;Therapeutic exercise;Neuromuscular re-education;Therapeutic activities;Moist Heat;Patient/family education;Manual techniques    Consulted and Agree with Plan of Care  Patient       Patient will benefit from skilled therapeutic intervention in order to improve the following deficits and impairments:  Impaired sensation, Difficulty walking, Decreased mobility, Decreased endurance, Decreased coordination, Decreased safety awareness, Decreased knowledge of precautions, Decreased range of motion, Decreased skin integrity  Visit Diagnosis: Muscle weakness (generalized)  Diastasis recti     Problem List Patient Active Problem List   Diagnosis Date Noted  . Prostate cancer ([place]) 11/19/2019  . Osteoarthritis 11/14/2018  . Migraines 11/14/2018  . Anxiety and depression 11/14/2018  . HTN (hypertension) 11/14/2018  . Benign prostatic hyperplasia with urinary frequency 10/26/2016  . HLD (hyperlipidemia) 10/02/2015  . OSA (obstructive sleep apnea) 03/13/2015  . Seasonal allergies 03/13/2015  . GERD (gastroesophageal reflux disease) 03/13/2015  . Diabetes mellitus ([place]) 10/30/2013    Jerl Mina ,PT, DPT, E-RYT  02/20/2020, 8:22 AM  [place] MAIN [facility] SERVICES [address] [place], [place], [address] Phone: [phone]   Fax:  [phone]  Name: Marc Aguilar MRN: [id number] Date of Birth: [birth year]

## 2020-02-26 ENCOUNTER — Other Ambulatory Visit: Payer: Self-pay | Admitting: Internal Medicine

## 2020-02-26 ENCOUNTER — Other Ambulatory Visit: Payer: Self-pay

## 2020-02-26 ENCOUNTER — Ambulatory Visit: Payer: No Typology Code available for payment source | Admitting: Physical Therapy

## 2020-02-26 DIAGNOSIS — M6281 Muscle weakness (generalized): Secondary | ICD-10-CM | POA: Diagnosis not present

## 2020-02-26 DIAGNOSIS — M6208 Separation of muscle (nontraumatic), other site: Secondary | ICD-10-CM

## 2020-02-26 NOTE — Patient Instructions (Addendum)
Bridging series w/ resistive band other side of doorknob:  °Level 1:  °Position:  Elbows bent, knees hip width apart, heels under knees  ° °Stabilization points: shoulders, upper arms, back of head pressed into floor. Heel press downward.  ° °Movement: inhale do nothing, exhale pull band by side, lower fists to floor completely while lifting hips.Keep stabilization points engaged when you allow the band to go back to starting position  °10 x 2 reps ° ° ° ° ° ° °Level 2:  °Position:  Elbows straight, arms raised to ceiling at shoulder height, knees apart like a ballerina,heels together, heels under knees ° °Stabilization points: shoulders, upper arms, back of head pressed into floor. Heel press downward.  ° °Movement: inhale do nothing, exhale pull band by side, lower fists to floor completely while lifting hips. Keep stabilization points engaged when you allow the band to go back to starting position  ° °10 x 2 reps ° °Shoulder training: Try to imagine you are squeezing a pencil under your armpit and your shoulder blades are down away from your ears and towards each other   ° ° °__ ° ° ° ° °Transition from standing to floor : ° stand to floor transfer :  °    _ slow °    _ mini squat  °    _ crawl down with one hand on thigh  °    _downward dog  - >  shoulders down and back-  walk the dog ( knee bents to lengthe hamstrings) °    ° °Floor to stand :  ° °downward dog   °crawl hands back, butt is back, knees behind toes -> squat  °Hands at waist , elbows back, chest lifts  ° ° ° ° ° ° ° °

## 2020-02-26 NOTE — Therapy (Signed)
[place] MAIN [facility] SERVICES [address] [place], [place], [address] Phone: [phone]   Fax:  [phone]  Physical Therapy Treatment  Patient Details  Name: Marc Aguilar MRN: [id number] Date of Birth: 06/05/1961 Referring Provider (PT): Joanette Gula Date: 02/26/2020    Past Medical History:  Diagnosis Date  . Anxiety   . Arthritis    "knees and hips" (02/28/2018)  . Cancer [facility])    prostate   . Chicken pox   . Depression   . GERD (gastroesophageal reflux disease)   . Heart murmur   . Hernia, abdominal   . High cholesterol   . History of gout   . Migraine    "probably monthly" (02/28/2018)  . OSA on CPAP   . Pneumonia    "once" (02/28/2018)  . Refusal of blood transfusions as patient is Jehovah's Witness   . Seasonal allergies   . Type II diabetes mellitus ([place])     Past Surgical History:  Procedure Laterality Date  . CARDIAC CATHETERIZATION  02/28/2018  . LEFT HEART CATH AND CORONARY ANGIOGRAPHY N/A 02/28/2018   Procedure: LEFT HEART CATH AND CORONARY ANGIOGRAPHY;  Surgeon: Sherren Mocha, MD;  Location: [facility];  Service: Cardiovascular;  Laterality: N/A;  . ROBOT ASSISTED LAPAROSCOPIC RADICAL PROSTATECTOMY N/A 11/19/2019   Procedure: XI ROBOTIC ASSISTED LAPAROSCOPIC RADICAL PROSTATECTOMY WITH LYMPH NODE DISSECTION;  Surgeon: Hollice Espy, MD;  Location: [facility] ORS;  Service: Urology;  Laterality: N/A;  . WRIST GANGLION EXCISION Left 1984    There were no vitals filed for this visit.  Subjective Assessment - 02/26/20 1521    Subjective  Pt reports he is performing 6 reps with 3 sec with pelvic floor squeezes before he tires out.         [facility] PT Assessment - 02/26/20 1546      Coordination   Gross Motor Movements are Fluid and Coordinated  --   excessive chest breathing,    Fine Motor Movements are Fluid and Coordinated  --   limited anterior rib depression     Palpation   Palpation comment   increased fascial tightness along flank/ lower ribs                    [facility] Adult PT Treatment/Exercise - 02/26/20 1541      Neuro Re-ed    Neuro Re-ed Details   cued for technique for new HEP, hguided relaxation       Exercises   Exercises  Other Exercises    Other Exercises   see pt instructions      Manual Therapy   Manual therapy comments  STM/ skin rolling along B flank to prmote anterior rib depression, minimize lordosis lumbar                PT Short Term Goals - 11/13/19 1502      PT SHORT TERM GOAL #1   Title  Pt will demo decreased abdominal bulging to improve intraabdominal pressure for improved outcome for continence post op ( 1/26: 1 finger width separation)    Time  2    Period  Weeks    Status  Achieved    Target Date  11/21/19      PT SHORT TERM GOAL #2   Title  Pt will demo proper body mechanics to minimize strain of abdominal/ spine/ pelvic floor    Time  1    Period  Weeks  Status  Achieved    Target Date  11/14/19      PT SHORT TERM GOAL #3   Title  Pt will demo IND with deep core/ pelvic floor coordination, strengthening    Time  2    Period  Weeks    Status  Achieved    Target Date  11/21/19        PT Long Term Goals - 01/31/20 1352      PT LONG TERM GOAL #1   Title  Pt will decrease urinary pads from 2-3 pads / day to < 1 pad a day in order to perform work duties    Time  8    Period  Weeks    Status  New      PT LONG TERM GOAL #2   Title  Pt will be IND with a fitness routine Scientist, research (physical sciences), pilates chair, resistance bands, dumbbells, treadmill)  in order to return to wellness and conditioning.    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 weight training and patient handling with less risk for lumbar injuries    Time  8    Period  Weeks    Status  New    Target Date  03/27/20      Additional Long Term Goals   Additional Long Term Goals  Yes      PT LONG TERM GOAL #6   Title  PT will increase his FOTO score for LBP from 69 pts to > 79pts in order to improve function    Time  10    Period  Weeks    Status  New    Target Date  04/10/20      PT LONG TERM GOAL #7   Title  Pt will report improved function on FOTO for Urinary Problems "Today, because of your urinary problem, do you orwould you have any difficulty at all with doinghousehold chores (cooking, cleaning, laundry)?" Moderate Difficulty to "no or little bit of difficulty"    Time  10    Period  Weeks    Status  New    Target Date  04/10/20            Plan - 02/26/20 1549    Clinical Impression Statement  Pt requried cues for less accessory mm overuse with deep core coordination and required manual Tx to increase anterior depression and less lumbar lordosis. Pt progressed with thoracolumbar strengthening with cues. 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   injuries.  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  With better stress management, pt will demo less relapse of upper trap mm overuse which will promote more optimal deep core  coordination and pelvic function.   Pt continues to benefit from skilled PT    Examination-Activity Limitations  Continence    Stability/Clinical Decision Making  Evolving/Moderate complexity    Rehab Potential  Good    PT Frequency  1x / week    PT Duration  Other (comment)   10   PT Treatment/Interventions  Balance training;Therapeutic exercise;Neuromuscular re-education;Therapeutic activities;Moist Heat;Patient/family education;Manual techniques    Consulted and Agree with Plan of Care  Patient       Patient will benefit from skilled therapeutic intervention in order to improve the following deficits and impairments:  Impaired sensation, Difficulty walking, Decreased mobility, Decreased endurance, Decreased coordination, Decreased safety awareness, Decreased knowledge of precautions, Decreased range of motion, Decreased skin integrity  Visit Diagnosis: Muscle weakness (generalized)  Diastasis recti     Problem List Patient Active Problem List   Diagnosis Date Noted  . Prostate cancer ([place]) 11/19/2019  . Osteoarthritis 11/14/2018  . Migraines 11/14/2018  . Anxiety and depression 11/14/2018  . HTN (hypertension) 11/14/2018  . Benign prostatic hyperplasia with urinary frequency 10/26/2016  . HLD (hyperlipidemia) 10/02/2015  . OSA (obstructive sleep apnea) 03/13/2015  . Seasonal allergies 03/13/2015  . GERD (gastroesophageal reflux disease) 03/13/2015  . Diabetes mellitus ([place]) 10/30/2013    Jerl Mina ,PT, DPT, E-RYT  02/26/2020, 4.00 PM  [place] MAIN [facility] SERVICES [address] [place], [place], [address] Phone: [phone]   Fax:  [phone]  Name: Marc Aguilar MRN: [id number] Date of Birth: [birth year]

## 2020-02-26 NOTE — Telephone Encounter (Signed)
Last refilled on 01/18/20 #30 with 0 refill  LOV 02/05/20 CPE

## 2020-03-04 ENCOUNTER — Ambulatory Visit: Payer: No Typology Code available for payment source | Admitting: Physical Therapy

## 2020-03-04 ENCOUNTER — Other Ambulatory Visit: Payer: Self-pay

## 2020-03-04 DIAGNOSIS — M6208 Separation of muscle (nontraumatic), other site: Secondary | ICD-10-CM

## 2020-03-04 DIAGNOSIS — M6281 Muscle weakness (generalized): Secondary | ICD-10-CM | POA: Diagnosis not present

## 2020-03-04 NOTE — Patient Instructions (Signed)
:   Lat pull down      Facing away from door   minisquat position, chin down towards heart  Inhale , arms up above head elbows bent out, at ear level Exhale, minisquat, press into the ballmounds of your feet , lower elbows, chine down towards heart   Inhale , as you rise up, press into the ballmounds of your feet , arms up above head elbows bent out, at ear level 10 x 3  ___  Walking away from door, leaning  "W"   2 min   ___  Standing PNF band exercises  Stand 45 deg to doorway, front leg is opp of hand holding band     "Pulling seat belt"   Band over door knob with knot on the other side of the door Stand with toes, knees, and pelvis turned 45 deg away from the door,  Exhale to pull band from R ear height across body to the L pocket without turning pelvis and knees  Follow hand with eyes/head  10 x 2 reps   ____  Multidifis standing  - press more into legs

## 2020-03-04 NOTE — Therapy (Signed)
[place] MAIN [facility] SERVICES [address] [place], [place], [address] Phone: [phone]   Fax:  [phone]  Physical Therapy Treatment  Patient Details  Name: Marc Aguilar MRN: [id number] Date of Birth: 1961-01-24 Referring Provider (PT): Erlene Quan   Encounter Date: 03/04/2020  PT End of Session - 03/04/20 1556    Visit Number  5    Number of Visits  10    Date for PT Re-Evaluation  04/08/20    PT Start Time  1503    PT Stop Time  1600    PT Time Calculation (min)  57 min    Activity Tolerance  Patient tolerated treatment well    Behavior During Therapy  [facility] for tasks assessed/performed       Past Medical History:  Diagnosis Date  . Anxiety   . Arthritis    "knees and hips" (02/28/2018)  . Cancer [facility])    prostate   . Chicken pox   . Depression   . GERD (gastroesophageal reflux disease)   . Heart murmur   . Hernia, abdominal   . High cholesterol   . History of gout   . Migraine    "probably monthly" (02/28/2018)  . OSA on CPAP   . Pneumonia    "once" (02/28/2018)  . Refusal of blood transfusions as patient is Jehovah's Witness   . Seasonal allergies   . Type II diabetes mellitus ([place])     Past Surgical History:  Procedure Laterality Date  . CARDIAC CATHETERIZATION  02/28/2018  . LEFT HEART CATH AND CORONARY ANGIOGRAPHY N/A 02/28/2018   Procedure: LEFT HEART CATH AND CORONARY ANGIOGRAPHY;  Surgeon: Sherren Mocha, MD;  Location: [place] [facility];  Service: Cardiovascular;  Laterality: N/A;  . ROBOT ASSISTED LAPAROSCOPIC RADICAL PROSTATECTOMY N/A 11/19/2019   Procedure: XI ROBOTIC ASSISTED LAPAROSCOPIC RADICAL PROSTATECTOMY WITH LYMPH NODE DISSECTION;  Surgeon: Hollice Espy, MD;  Location: [facility] ORS;  Service: Urology;  Laterality: N/A;  . WRIST GANGLION EXCISION Left 1984    There were no vitals filed for this visit.  Subjective Assessment - 03/04/20 1517    Subjective  Pt is now wearing one pad during the day and  one pad at night. Pt is no longer leaking when seated to stand up to get the new pad. Pt's LBP has improved by75%         [facility] PT Assessment - 03/04/20 1608      Observation/Other Assessments   Observations  diassociation of trunk/ pelvis in HEP, cues required for decreasing  upper trap        Palpation   Spinal mobility  limited expansion at anterior ribs L>R                     [facility] Adult PT Treatment/Exercise - 03/04/20 1615      Therapeutic Activites    Other Therapeutic Activities  discussed getting MD note to not perform sit ups and crunches in physical for [employer] Dept  administed FOTO ,       Neuro Re-ed    Neuro Re-ed Details   cued for less upper trap overuse in scapular stabilization exercises      Manual Therapy   Manual therapy comments  STM/MWM at L intercostal ribs to promote anterior excursion               PT Short Term Goals - 11/13/19 1502      PT SHORT TERM GOAL #1  Title  Pt will demo decreased abdominal bulging to improve intraabdominal pressure for improved outcome for continence post op ( 1/26: 1 finger width separation)    Time  2    Period  Weeks    Status  Achieved    Target Date  11/21/19      PT SHORT TERM GOAL #2   Title  Pt will demo proper body mechanics to minimize strain of abdominal/ spine/ pelvic floor    Time  1    Period  Weeks    Status  Achieved    Target Date  11/14/19      PT SHORT TERM GOAL #3   Title  Pt will demo IND with deep core/ pelvic floor coordination, strengthening    Time  2    Period  Weeks    Status  Achieved    Target Date  11/21/19        PT Long Term Goals - 03/04/20 1603      PT LONG TERM GOAL #1   Title  Pt will decrease urinary pads from 2-3 pads / day to < 1 pad a day in order to perform work duties ( 5/18:21: 1 day during day and 1 pad during night)    Time  8    Period  Weeks    Status  Achieved      PT LONG TERM GOAL #2   Title  Pt will be IND with a fitness routine Film/video editor, pilates chair, resistance bands, dumbbells, treadmill)  in order to return to wellness and conditioning.    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 weight training and patient handling with less risk for lumbar injuries    Time  8    Period  Weeks    Status  On-going      PT LONG TERM GOAL #6   Title  PT will increase his FOTO score for LBP from 69 pts to > 79pts in order to improve function  ( 5/18?21: 94 pts)    Time  10    Period  Weeks    Status  Achieved      PT LONG TERM GOAL #7   Title  Pt will report improved function on FOTO for Urinary Problems "Today, because of your urinary problem, do you orwould you have any difficulty at all with doinghousehold chores (cooking, cleaning, laundry)?" Moderate Difficulty to "no or little bit of difficulty"    Time  10    Period  Weeks    Status  On-going  Plan - 03/04/20 1605    Clinical Impression Statement Pt continues to make progress with pelvic floor strengthening and demonstrates increased IAP with less abdominal separation. LBP has improved significantly.   Continence is improving with 1 pad at night and 1 pad during the day with no more leakage with sit to stand  Pt required manual Tx to facilitate more anterior rib excursion and further promote more  abdominal strengthening and less lumbar lordosis. Added functional strengthening exercises with resistance bands to increase propioception and scapulothoracic and posterior chain to prepare pt towards fitness and patient handling at work with less risk for injuries. Pt advanced to standing multifidis twist with moderate cues for disassociation of trunk/pelvis. Plan to discuss pilates machine use.  Advised pt to avoid sit up and crunches for [employer] physical exam and to get a MD note if need. Pt continues to benefit form skilled PT . Plan to d/c at next session   Examination-Activity Limitations  Continence    Stability/Clinical Decision Making  Evolving/Moderate complexity    Rehab Potential  Good    PT Frequency  1x / week    PT Duration  Other (comment)   10   PT Treatment/Interventions  Balance training;Therapeutic exercise;Neuromuscular re-education;Therapeutic activities;Moist Heat;Patient/family education;Manual techniques    Consulted and Agree with Plan of Care  Patient       Patient will benefit from skilled therapeutic intervention in order to improve the following deficits and impairments:  Impaired sensation, Difficulty walking, Decreased mobility, Decreased endurance, Decreased coordination, Decreased safety awareness, Decreased knowledge of precautions, Decreased range of motion, Decreased skin integrity  Visit Diagnosis: Muscle weakness (generalized)  Diastasis recti     Problem List Patient Active Problem List   Diagnosis Date Noted  . Prostate cancer ([place]) 11/19/2019  . Osteoarthritis 11/14/2018  . Migraines 11/14/2018  . Anxiety and depression 11/14/2018  . HTN (hypertension) 11/14/2018  . Benign prostatic hyperplasia with urinary frequency 10/26/2016  . HLD (hyperlipidemia) 10/02/2015  . OSA (obstructive sleep apnea) 03/13/2015  . Seasonal allergies 03/13/2015  . GERD (gastroesophageal reflux disease) 03/13/2015  . Diabetes mellitus ([place]) 10/30/2013     Jerl Mina ,PT, DPT, E-RYT  03/04/2020, 4:19 PM  [place] MAIN [facility] SERVICES [address] [place], [place], [address] Phone: [phone]   Fax:  [phone]  Name: Marc Aguilar MRN: [id number] Date of Birth: 1960/11/15

## 2020-03-05 ENCOUNTER — Encounter: Payer: Self-pay | Admitting: Urology

## 2020-03-11 ENCOUNTER — Ambulatory Visit: Payer: No Typology Code available for payment source | Admitting: Physical Therapy

## 2020-03-11 ENCOUNTER — Other Ambulatory Visit: Payer: Self-pay

## 2020-03-11 DIAGNOSIS — M6281 Muscle weakness (generalized): Secondary | ICD-10-CM

## 2020-03-11 DIAGNOSIS — M6208 Separation of muscle (nontraumatic), other site: Secondary | ICD-10-CM

## 2020-03-11 NOTE — Patient Instructions (Addendum)
Pilates exercises:  Watch out for swayed back    _sidelying ( tricep pull with top arm    While bottom foot against edge of the post,   _"w" arms with bridging  , heel on top of post   _"tricep - straight bent elbows" feet on top post without bridging    __  Plank Modifications  Dolphin plank with fists together On knees Toes tucked under Chin tucked No swayed back   Like child poses rocking   10 reps

## 2020-03-11 NOTE — Therapy (Addendum)
[place] MAIN [facility] SERVICES [address] [place], [place], [address] Phone: [phone]   Fax:  [phone]  Physical Therapy Treatment  Patient Details  Name: Marc Aguilar MRN: [id number] Date of Birth: 15-Jun-1961 Referring Provider (PT): Erlene Quan   Encounter Date: 03/11/2020  PT End of Session - 03/11/20 1525    Visit Number  6    Number of Visits  10    Date for PT Re-Evaluation  04/08/20    PT Start Time  1512    PT Stop Time  1600    PT Time Calculation (min)  48 min    Activity Tolerance  Patient tolerated treatment well    Behavior During Therapy  [facility] for tasks assessed/performed       Past Medical History:  Diagnosis Date  . Anxiety   . Arthritis    "knees and hips" (02/28/2018)  . Cancer [facility])    prostate   . Chicken pox   . Depression   . GERD (gastroesophageal reflux disease)   . Heart murmur   . Hernia, abdominal   . High cholesterol   . History of gout   . Migraine    "probably monthly" (02/28/2018)  . OSA on CPAP   . Pneumonia    "once" (02/28/2018)  . Refusal of blood transfusions as patient is Jehovah's Witness   . Seasonal allergies   . Type II diabetes mellitus ([place])     Past Surgical History:  Procedure Laterality Date  . CARDIAC CATHETERIZATION  02/28/2018  . LEFT HEART CATH AND CORONARY ANGIOGRAPHY N/A 02/28/2018   Procedure: LEFT HEART CATH AND CORONARY ANGIOGRAPHY;  Surgeon: Sherren Mocha, MD;  Location: [place] CV LAB;  Service: Cardiovascular;  Laterality: N/A;  . ROBOT ASSISTED LAPAROSCOPIC RADICAL PROSTATECTOMY N/A 11/19/2019   Procedure: XI ROBOTIC ASSISTED LAPAROSCOPIC RADICAL PROSTATECTOMY WITH LYMPH NODE DISSECTION;  Surgeon: Hollice Espy, MD;  Location: [facility] ORS;  Service: Urology;  Laterality: N/A;  . WRIST GANGLION EXCISION Left 1984    There were no vitals filed for this visit.  Subjective Assessment - 03/12/20 1416    Subjective  Pt still does not know when the PE exam will  be with the volunteer IT trainer department. Pt communicated with Dr. 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 and  ways to perform them without straining abdominopelvic area       Neuro Re-ed    Neuro Re-ed Details   excessive cues for stabilization in plank, modifications required due to lumbar lordosis, cued for simulated pilates machines exercises for scapulothoracic strengthening                PT Short Term Goals - 11/13/19 1502      PT SHORT TERM GOAL #1   Title  Pt will demo decreased abdominal bulging to improve intraabdominal pressure for improved outcome for continence post op ( 1/26:  1 finger width separation)    Time  2    Period  Weeks    Status  Achieved    Target Date  11/21/19      PT SHORT TERM GOAL #2   Title  Pt will demo proper body mechanics to minimize strain of abdominal/ spine/ pelvic floor    Time  1    Period  Weeks    Status  Achieved    Target Date  11/14/19      PT SHORT TERM GOAL #3   Title  Pt will demo IND with deep core/ pelvic floor coordination, strengthening    Time  2    Period  Weeks    Status  Achieved    Target Date  11/21/19        PT Long Term Goals - 03/11/20 1527      PT LONG TERM GOAL #1   Title  Pt will decrease urinary pads from 2-3 pads / day to < 1 pad a day in order to perform work duties ( 5/18:21: 1 day during day and 1 pad during night)    Time  8    Period  Weeks    Status  Achieved      PT LONG TERM GOAL #2   Title  Pt will be IND with a fitness routine Film/video editor, pilates chair, resistance bands, dumbbells, treadmill)  in order to return to wellness and conditioning.    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 weight training and patient handling with less risk for lumbar injuries    Time  8    Period  Weeks    Status  Achieved      PT LONG TERM GOAL #6   Title  PT will increase his FOTO score for LBP from 69 pts to > 79pts in order to improve function  ( 5/18?21: 94 pts)    Time  10    Period  Weeks    Status  Achieved      PT LONG TERM GOAL #7   Title  Pt will report improved function on FOTO for Urinary Problems "Today, because of your urinary problem, do you orwould you have any difficulty at all with doinghousehold chores (cooking, cleaning, laundry)?" Moderate Difficulty to "no or little bit of difficulty"    Time  10    Period  Weeks    Status  Achieved            Plan - 03/11/20 1527    Clinical Impression Statement  Pt required cues for less lumbar lordosis in simulated pilates machine exercise and with planks. This indicated pt's further need for skilled PT to continues to require strengthening postural and global muscles and techniques to perform normal fitness exercises with decreased risk for pelvic floor dysfunction. 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 Pt was explained the importance to withhold from these movements given his recent surgery and need to not place more strain on pelvic and abdominal mm and to continue optimizing his gains with urinary continence. Pt continues to benefit from skilled PT.    Examination-Activity Limitations  Continence    Stability/Clinical Decision Making  Evolving/Moderate complexity    Rehab Potential  Good    PT Frequency  1x / week    PT Duration  Other (comment)   10   PT Treatment/Interventions  Balance training;Therapeutic exercise;Neuromuscular re-education;Therapeutic activities;Moist Heat;Patient/family education;Manual techniques    Consulted and Agree with Plan of Care  Patient       Patient will benefit from skilled therapeutic intervention in order to improve the following deficits and impairments:  Impaired sensation, Difficulty walking, Decreased mobility, Decreased endurance, Decreased coordination, Decreased safety awareness, Decreased knowledge of precautions, Decreased range of motion, Decreased skin integrity  Visit Diagnosis: Muscle weakness (generalized)  Diastasis recti     Problem List Patient Active Problem List   Diagnosis Date Noted  . Prostate cancer ([place]) 11/19/2019  . Osteoarthritis 11/14/2018  . Migraines 11/14/2018  . Anxiety and depression 11/14/2018  . HTN (hypertension) 11/14/2018  . Benign prostatic hyperplasia with urinary frequency 10/26/2016  . HLD (hyperlipidemia) 10/02/2015  . OSA (obstructive sleep apnea) 03/13/2015  . Seasonal allergies 03/13/2015  . GERD (gastroesophageal reflux disease) 03/13/2015  . Diabetes mellitus ([place]) 10/30/2013    Jerl Mina ,PT, DPT, E-RYT  03/12/2020, 2:17 PM  [place] MAIN [facility] SERVICES [address] [place], [place], [address] Phone: [phone]   Fax:  [phone]  Name: TAIGE DESANTIS MRN: [id number] Date  of Birth: 01-08-1961

## 2020-03-18 ENCOUNTER — Ambulatory Visit: Payer: No Typology Code available for payment source | Admitting: Physical Therapy

## 2020-03-18 MED FILL — CITALOPRAM HBR 40 MG TABLET: 40 | 30 days supply | Qty: 30 | Fill #3

## 2020-03-18 MED FILL — SILDENAFIL CITRATE 20 MG TA: 20 | 30 days supply | Qty: 30 | Fill #3

## 2020-03-18 MED FILL — buPROPion HCL ER (XL) 150 M: 150 | 30 days supply | Qty: 30 | Fill #1

## 2020-03-22 MED FILL — AMLODIPINE BESYLATE 5 MG TA: 5 | 90 days supply | Qty: 90 | Fill #1

## 2020-03-25 ENCOUNTER — Other Ambulatory Visit: Payer: Self-pay

## 2020-03-25 ENCOUNTER — Ambulatory Visit: Payer: No Typology Code available for payment source | Attending: Urology | Admitting: Physical Therapy

## 2020-03-25 DIAGNOSIS — M6208 Separation of muscle (nontraumatic), other site: Secondary | ICD-10-CM | POA: Insufficient documentation

## 2020-03-25 DIAGNOSIS — M6281 Muscle weakness (generalized): Secondary | ICD-10-CM | POA: Insufficient documentation

## 2020-03-25 NOTE — Therapy (Signed)
[place] MAIN [facility] SERVICES [address] [place], [place], [address] Phone: [phone]   Fax:  [phone]  Physical Therapy Treatment  Patient Details  Name: Marc Aguilar MRN: [id number] Date of Birth: 1961/03/25 Referring Provider (PT): Erlene Quan   Encounter Date: 03/25/2020  PT End of Session - 03/25/20 1558    Visit Number  7    Number of Visits  10    Date for PT Re-Evaluation  04/08/20    PT Start Time  1504    PT Stop Time  1559    PT Time Calculation (min)  55 min    Activity Tolerance  Patient tolerated treatment well    Behavior During Therapy  [facility] for tasks assessed/performed       Past Medical History:  Diagnosis Date  . Anxiety   . Arthritis    "knees and hips" (02/28/2018)  . Cancer [facility])    prostate   . Chicken pox   . Depression   . GERD (gastroesophageal reflux disease)   . Heart murmur   . Hernia, abdominal   . High cholesterol   . History of gout   . Migraine    "probably monthly" (02/28/2018)  . OSA on CPAP   . Pneumonia    "once" (02/28/2018)  . Refusal of blood transfusions as patient is Jehovah's Witness   . Seasonal allergies   . Type II diabetes mellitus ([place])     Past Surgical History:  Procedure Laterality Date  . CARDIAC CATHETERIZATION  02/28/2018  . LEFT HEART CATH AND CORONARY ANGIOGRAPHY N/A 02/28/2018   Procedure: LEFT HEART CATH AND CORONARY ANGIOGRAPHY;  Surgeon: Sherren Mocha, MD;  Location: [place] CV LAB;  Service: Cardiovascular;  Laterality: N/A;  . ROBOT ASSISTED LAPAROSCOPIC RADICAL PROSTATECTOMY N/A 11/19/2019   Procedure: XI ROBOTIC ASSISTED LAPAROSCOPIC RADICAL PROSTATECTOMY WITH LYMPH NODE DISSECTION;  Surgeon: Hollice Espy, MD;  Location: [facility] ORS;  Service: Urology;  Laterality: N/A;  . WRIST GANGLION EXCISION Left 1984    There were no vitals filed for this visit.  Subjective Assessment - 03/25/20 1527    Subjective  Pt still does not know when the PE exam will  be with the volunteer IT trainer department. Pt communicated with Dr. Erlene Quan about getting note to withhold from sit up and crunches and Erlene Quan deferred back to PT                  Observations:  full plank/ dolphin plank on floor with difficulty  lumbar lordosis in squats       [facility] Adult PT Treatment/Exercise - 03/25/20 1557      Neuro Re-ed    Neuro Re-ed Details   excessive cues for less lumbar lordosis in squats, wall squat , wall stretch , dolphin  plank by wall                 PT Short Term Goals - 11/13/19 1502      PT SHORT TERM GOAL #1   Title  Pt will demo decreased abdominal bulging to improve intraabdominal pressure for improved outcome for continence post op ( 1/26: 1 finger width separation)    Time  2    Period  Weeks    Status  Achieved    Target Date  11/21/19      PT SHORT TERM GOAL #2   Title  Pt will demo proper body mechanics to minimize strain of abdominal/ spine/ pelvic floor  Time  1    Period  Weeks    Status  Achieved    Target Date  11/14/19      PT SHORT TERM GOAL #3   Title  Pt will demo IND with deep core/ pelvic floor coordination, strengthening    Time  2    Period  Weeks    Status  Achieved    Target Date  11/21/19        PT Long Term Goals - 03/11/20 1527      PT LONG TERM GOAL #1   Title  Pt will decrease urinary pads from 2-3 pads / day to < 1 pad a day in order to perform work duties ( 5/18:21: 1 day during day and 1 pad during night)    Time  8    Period  Weeks    Status  Achieved      PT LONG TERM GOAL #2   Title  Pt will be IND with a fitness routine Scientist, research (physical sciences), pilates chair, resistance bands, dumbbells, treadmill)  in order to return to wellness and conditioning.    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 weight training and patient handling with less risk for lumbar injuries    Time  8    Period  Weeks    Status  Achieved      PT LONG TERM GOAL #6   Title  PT will increase his FOTO score for LBP from 69 pts to > 79pts in order to improve function  ( 5/18?21: 94 pts)    Time  10    Period  Weeks    Status  Achieved      PT LONG TERM GOAL #7   Title  Pt will report improved function on FOTO for Urinary Problems "Today, because of your urinary problem, do you orwould you have any difficulty at all with doinghousehold chores (cooking, cleaning, laundry)?" Moderate Difficulty to "no or little bit of difficulty"    Time  10    Period  Weeks    Status  Achieved            Plan - 03/25/20 1559    Clinical Impression Statement  Pt required excessive cues for less lumbar lordosis due to weak abdominal mm. Pt required down grade on planks and requried plantigrade position to co-activate abdominal mm. Pt continues to benefit from skilled PT    Examination-Activity Limitations  Continence    Stability/Clinical Decision Making  Evolving/Moderate complexity    Rehab Potential  Good  PT Frequency  1x / week    PT Duration  Other (comment)   10   PT Treatment/Interventions  Balance training;Therapeutic exercise;Neuromuscular re-education;Therapeutic activities;Moist Heat;Patient/family education;Manual techniques    Consulted and Agree with Plan of Care  Patient       Patient will benefit from skilled therapeutic  intervention in order to improve the following deficits and impairments:  Impaired sensation, Difficulty walking, Decreased mobility, Decreased endurance, Decreased coordination, Decreased safety awareness, Decreased knowledge of precautions, Decreased range of motion, Decreased skin integrity  Visit Diagnosis: Muscle weakness (generalized)  Diastasis recti     Problem List Patient Active Problem List   Diagnosis Date Noted  . Prostate cancer ([place]) 11/19/2019  . Osteoarthritis 11/14/2018  . Migraines 11/14/2018  . Anxiety and depression 11/14/2018  . HTN (hypertension) 11/14/2018  . Benign prostatic hyperplasia with urinary frequency 10/26/2016  . HLD (hyperlipidemia) 10/02/2015  . OSA (obstructive sleep apnea) 03/13/2015  . Seasonal allergies 03/13/2015  . GERD (gastroesophageal reflux disease) 03/13/2015  . Diabetes mellitus ([place]) 10/30/2013    Jerl Mina ,PT, DPT, E-RYT  03/25/2020, 4:02 PM  [place] MAIN [facility] SERVICES [address] [place], [place], [address] Phone: [phone]   Fax:  [phone]  Name: Marc Aguilar MRN: [id number] Date of Birth: 05/10/1961

## 2020-03-25 NOTE — Patient Instructions (Addendum)
  WITH ALL EXERCISES today:  observe not swaying back - think gaze lies downward, hands on thumb and pelvis to check that ribs are not more forward   At work   [employer] squats 20reps Sides step + mini squat + resistance band elbow by side, pulling out like rotator cuff  10 reps twoards R, 10 reps L    Modified push up by wall : dolphin on forearms , mini squat position      Side of hip stretch:  Reclined twist for hips and side of the hips/ legs  Lay on your back, knees bend Scoot hips to the R , leave shoulders in place Drop knees to the L side resting onto pillows to keep leg at the same width of hips Pillow under L thigh to minimize too much strain

## 2020-04-01 ENCOUNTER — Other Ambulatory Visit: Payer: Self-pay

## 2020-04-01 ENCOUNTER — Ambulatory Visit: Payer: No Typology Code available for payment source | Admitting: Physical Therapy

## 2020-04-01 DIAGNOSIS — M6208 Separation of muscle (nontraumatic), other site: Secondary | ICD-10-CM

## 2020-04-01 DIAGNOSIS — M6281 Muscle weakness (generalized): Secondary | ICD-10-CM

## 2020-04-01 NOTE — Patient Instructions (Signed)
6 min walking at work x 2 x   remember leaning forward, longer strides  __  Build up to 1 mile of walking 3 x week after walk  __   Post walking stretches   ___  Trampoline sets   30 sec jog  30 sec rest   X 5 reps   ____ stretches  After wards

## 2020-04-02 NOTE — Therapy (Signed)
[place] MAIN [facility] SERVICES [address] [place], [place], [address] Phone: [phone]   Fax:  [phone]  Physical Therapy Treatment  Patient Details  Name: Marc Aguilar MRN: [id number] Date of Birth: [birth year] Referring Provider (PT): Erlene Quan   Encounter Date: 04/01/2020   PT End of Session - 04/01/20 1514    Visit Number 8    Number of Visits 10    Date for PT Re-Evaluation 04/08/20    PT Start Time 9476    PT Stop Time 1600    PT Time Calculation (min) 53 min    Activity Tolerance Patient tolerated treatment well    Behavior During Therapy [facility] for tasks assessed/performed           Past Medical History:  Diagnosis Date  . Anxiety   . Arthritis    "knees and hips" (02/28/2018)  . Cancer [facility])    prostate   . Chicken pox   . Depression   . GERD (gastroesophageal reflux disease)   . Heart murmur   . Hernia, abdominal   . High cholesterol   . History of gout   . Migraine    "probably monthly" (02/28/2018)  . OSA on CPAP   . Pneumonia    "once" (02/28/2018)  . Refusal of blood transfusions as patient is Jehovah's Witness   . Seasonal allergies   . Type II diabetes mellitus ([place])     Past Surgical History:  Procedure Laterality Date  . CARDIAC CATHETERIZATION  02/28/2018  . LEFT HEART CATH AND CORONARY ANGIOGRAPHY N/A 02/28/2018   Procedure: LEFT HEART CATH AND CORONARY ANGIOGRAPHY;  Surgeon: Sherren Mocha, MD;  Location: [place] CV LAB;  Service: Cardiovascular;  Laterality: N/A;  . ROBOT ASSISTED LAPAROSCOPIC RADICAL PROSTATECTOMY N/A 11/19/2019   Procedure: XI ROBOTIC ASSISTED LAPAROSCOPIC RADICAL PROSTATECTOMY WITH LYMPH NODE DISSECTION;  Surgeon: Hollice Espy, MD;  Location: [facility] ORS;  Service: Urology;  Laterality: N/A;  . WRIST GANGLION EXCISION Left 1984    There were no vitals filed for this visit.   Subjective Assessment - 04/01/20 1511    Subjective Pt has no questions from last session. Pt  asked about when to start to run. Pt used to run once week, working up from 0.5 miles to 3 miles, for one year pre-op. Today pt is 4 months post op. Pt has not had leakage. Pt is trying to drink more water.  Pt returned to work to 3 weeks and hardly sits down, Pt has a walking routine              [facility] PT Assessment - 04/02/20 1236      Observation/Other Assessments   Observations less cues for lumbar lordosis in wall pushups       Ambulation/Gait   Gait Comments demo'd short strides, posterior COM, shoulder posterior, rib flare,   cued for more anterior COM and less rib flaring to optimize TrA ,                          [facility] Adult PT Treatment/Exercise - 04/02/20 1236      Therapeutic Activites    Other Therapeutic Activities explained the progression towards running and prerequisites to minimize relapse of urinary leakage, cued for gait with more co-activation of ab mm        Neuro Re-ed    Neuro Re-ed Details  cued for less lumbar lordosis,  safe stepping off trampoline,  Exercises   Other Exercises  see pt instructions, use of mini trampoline to challenge bladder and pelvic floor with loading forces in preparation for pt to return to running                     PT Short Term Goals - 11/13/19 1502      PT SHORT TERM GOAL #1   Title Pt will demo decreased abdominal bulging to improve intraabdominal pressure for improved outcome for continence post op ( 1/26: 1 finger width separation)    Time 2    Period Weeks    Status Achieved    Target Date 11/21/19      PT SHORT TERM GOAL #2   Title Pt will demo proper body mechanics to minimize strain of abdominal/ spine/ pelvic floor    Time 1    Period Weeks    Status Achieved    Target Date 11/14/19      PT SHORT TERM GOAL #3   Title Pt will demo IND with deep core/ pelvic floor coordination, strengthening    Time 2    Period Weeks    Status Achieved    Target Date 11/21/19              PT Long Term Goals - 04/01/20 1514      PT LONG TERM GOAL #1   Title Pt will decrease urinary pads from 2-3 pads / day to < 1 pad a day in order to perform work duties ( 5/18:21: 1 day during day and 1 pad during night)    Time 8    Period Weeks    Status Achieved      PT LONG TERM GOAL #2   Title Pt will be IND with a fitness routine Scientist, research (physical sciences), pilates chair, resistance bands, dumbbells, treadmill)  in order to return to wellness and conditioning.    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 weight training and patient handling with less risk for lumbar injuries    Time 8    Period Weeks    Status Achieved      Additional Long Term Goals   Additional Long Term Goals Yes      PT LONG TERM GOAL #6   Title PT will increase his FOTO score for LBP from 69 pts to > 79pts in order to improve function  ( 5/18?21: 94 pts)    Time 10    Period Weeks    Status Achieved      PT LONG TERM GOAL #7   Title Pt will report improved function on FOTO for  Urinary Problems "Today, because of your urinary problem, do you orwould you have any difficulty at all with doinghousehold chores (cooking, cleaning, laundry)?" Moderate Difficulty to "no or little bit of difficulty"    Time 10    Period Weeks    Status Achieved      PT LONG TERM GOAL #8   Title  Pt will demo no fatigue and have proper alignment and technique without lumbor lordosis  with 5 push ups regular form in order returnt of fitness    Time 8    Period Weeks    Status New    Target Date 05/27/20                 Plan - 04/01/20 1558    Clinical Impression Statement Pt required less cues for lumbar lordosis but required more cues with gait training to optimize deep core system with more anterior COM, longer strides, more hip flexion.  Explained pt the steps to progress towards running gradually by increasing his walking routine with better gait mechanics. Guided pt on pace, and frequency, and use of trampoline for less impact on pelvic floor while still getting cardio. Pt reported no leakage with 30 sec intervals x 5 on trampoline walking which indicates his IAP is improving.  Pt continues to benefit from skilled PT to integrate back to fitness safely.    Examination-Activity Limitations Continence    Stability/Clinical Decision Making Evolving/Moderate complexity    Rehab Potential Good    PT Frequency 1x / week    PT Duration Other (comment)   10   PT Treatment/Interventions Balance training;Therapeutic exercise;Neuromuscular re-education;Therapeutic activities;Moist Heat;Patient/family education;Manual techniques    Consulted and Agree with Plan of Care Patient           Patient will benefit from skilled therapeutic intervention in order to improve the following deficits and impairments:  Impaired sensation, Difficulty walking, Decreased mobility, Decreased endurance, Decreased coordination, Decreased safety awareness, Decreased knowledge of precautions, Decreased range of motion, Decreased skin integrity  Visit Diagnosis: Muscle weakness (generalized)  Diastasis recti     Problem List Patient Active Problem List   Diagnosis Date Noted  . Prostate cancer ([place]) 11/19/2019  . Osteoarthritis 11/14/2018  . Migraines 11/14/2018  . Anxiety and depression  11/14/2018  . HTN (hypertension) 11/14/2018  . Benign prostatic hyperplasia with urinary frequency 10/26/2016  . HLD (hyperlipidemia) 10/02/2015  . OSA (obstructive sleep apnea) 03/13/2015  . Seasonal allergies 03/13/2015  . GERD (gastroesophageal reflux disease) 03/13/2015  . Diabetes mellitus ([place]) 10/30/2013    Jerl Mina ,PT, DPT, E-RYT  04/02/2020, 12:39 PM  [place] MAIN [facility] SERVICES [address] [place], [place], [address] Phone: [phone]   Fax:  [phone]  Name: ELYA DILORETO MRN: [id number] Date of Birth: May 15, 1961

## 2020-04-08 ENCOUNTER — Other Ambulatory Visit: Payer: Self-pay

## 2020-04-08 ENCOUNTER — Ambulatory Visit: Payer: No Typology Code available for payment source | Admitting: Physical Therapy

## 2020-04-08 DIAGNOSIS — M6281 Muscle weakness (generalized): Secondary | ICD-10-CM

## 2020-04-08 DIAGNOSIS — M6208 Separation of muscle (nontraumatic), other site: Secondary | ICD-10-CM

## 2020-04-08 NOTE — Patient Instructions (Addendum)
Progress from dolphin plank on your knees to extended knees   Side plank - 45 deg forearm, elbow is slightly ahead of shoulder, active bottom foot, top one bent knee with perpendicular shin to floor  10 sec counts x 10 rep x 2    Progress with dolpin plank on bench instead of wall    Mini squat/ sidestep with 15 lb weight by ribs, elbows by sides

## 2020-04-09 NOTE — Therapy (Addendum)
[place] MAIN [facility] SERVICES [address] [place], [place], [address] Phone: [phone]   Fax:  [phone]  Physical Therapy Treatment  Patient Details  Name: Marc Aguilar MRN: [id number] Date of Birth: 14-Oct-1961 Referring Provider (PT): Erlene Quan   Encounter Date: 04/08/2020   PT End of Session - 04/08/20 1712    Visit Number 9    Number of Visits 19    Date for PT Re-Evaluation 06/17/20   PN 6/22   PT Start Time 8250    PT Stop Time 1745    PT Time Calculation (min) 38 min    Activity Tolerance Patient tolerated treatment well    Behavior During Therapy [facility] for tasks assessed/performed           Past Medical History:  Diagnosis Date  . Anxiety   . Arthritis    "knees and hips" (02/28/2018)  . Cancer [facility])    prostate   . Chicken pox   . Depression   . GERD (gastroesophageal reflux disease)   . Heart murmur   . Hernia, abdominal   . High cholesterol   . History of gout   . Migraine    "probably monthly" (02/28/2018)  . OSA on CPAP   . Pneumonia    "once" (02/28/2018)  . Refusal of blood transfusions as patient is Jehovah's Witness   . Seasonal allergies   . Type II diabetes mellitus ([place])     Past Surgical History:  Procedure Laterality Date  . CARDIAC CATHETERIZATION  02/28/2018  . LEFT HEART CATH AND CORONARY ANGIOGRAPHY N/A 02/28/2018   Procedure: LEFT HEART CATH AND CORONARY ANGIOGRAPHY;  Surgeon: Sherren Mocha, MD;  Location: [place] [facility];  Service: Cardiovascular;  Laterality: N/A;  . ROBOT ASSISTED LAPAROSCOPIC RADICAL PROSTATECTOMY N/A 11/19/2019   Procedure: XI ROBOTIC ASSISTED LAPAROSCOPIC RADICAL PROSTATECTOMY WITH LYMPH NODE DISSECTION;  Surgeon: Hollice Espy, MD;  Location: [facility] ORS;  Service: Urology;  Laterality: N/A;  . WRIST GANGLION EXCISION Left 1984    There were no vitals filed for this visit.   Subjective Assessment - 04/08/20 1713    Subjective Pt reports he only has to change his  pad once during a 12 hour shift. Pt built up his walking routine at work            O:  demo'd improved technique in plank/ squat modifications, requiring decreased cues for less lumbar lordosis   A:  Advanced to upgrade of plank with less difficulty and more stability        PT Short Term Goals - 11/13/19 1502      PT SHORT TERM GOAL #1   Title Pt will demo decreased abdominal bulging to improve intraabdominal pressure for improved outcome for continence post op ( 1/26: 1 finger width separation)    Time 2    Period Weeks    Status Achieved    Target Date 11/21/19      PT SHORT TERM GOAL #2   Title Pt will demo proper body mechanics to minimize strain of abdominal/ spine/ pelvic floor    Time 1    Period Weeks    Status Achieved    Target Date 11/14/19      PT SHORT TERM GOAL #3   Title Pt will demo IND with deep core/ pelvic floor coordination, strengthening    Time 2    Period Weeks    Status Achieved    Target Date 11/21/19  PT Long Term Goals - 04/01/20 1514      PT LONG TERM GOAL #1   Title Pt will decrease urinary pads from 2-3 pads / day to < 1 pad a day in order to perform work duties ( 5/18:21: 1 day during day and 1 pad during night)    Time 8    Period Weeks    Status Achieved      PT LONG TERM GOAL #2   Title Pt will be IND with a fitness routine Scientist, research (physical sciences), pilates chair, resistance bands, dumbbells, treadmill)  in order to return to wellness and conditioning.    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 weight training and patient handling with less risk for lumbar injuries    Time 8    Period Weeks    Status Achieved      Additional Long Term Goals   Additional Long Term Goals Yes      PT LONG TERM GOAL #6   Title PT will increase his FOTO score for LBP from 69 pts to > 79pts in order to improve function  ( 5/18?21: 94 pts)    Time 10    Period Weeks    Status Achieved      PT LONG TERM GOAL #7   Title Pt will report improved function on FOTO for Urinary Problems "Today, because of your urinary problem, do you orwould you have any difficulty at all with doinghousehold chores (cooking, cleaning, laundry)?" Moderate Difficulty to "no or little bit of difficulty"    Time 10    Period Weeks    Status Achieved      PT LONG TERM GOAL #8   Title Pt will demo no fatigue and have proper alignment and technique without lumbor lordosis  with 5 push ups regular form in order returnt of fitness    Time 8    Period Weeks    Status New    Target Date 05/27/20                 Plan - 04/08/20 1712    Clinical Impression Statement Pt demo'd improved technique with modified plank modifications and squat technique with less cuing. Pt demo'd more co-activation of deep abdominal mm with less lumbar lordosis. Pt advanced to next upgrades to fitness exercises. Pt will be working with another PT with Pilates certification to assist pt on exercises he can perform on his reformer and pilates chair at home. Pt continues to benefit from skilled PT    Examination-Activity Limitations Continence    Stability/Clinical Decision Making Evolving/Moderate complexity    Rehab Potential  Good    PT Frequency 1x / week    PT Duration Other (comment)   10   PT Treatment/Interventions Balance  training;Therapeutic exercise;Neuromuscular re-education;Therapeutic activities;Moist Heat;Patient/family education;Manual techniques    Consulted and Agree with Plan of Care Patient           Patient will benefit from skilled therapeutic intervention in order to improve the following deficits and impairments:  Impaired sensation, Difficulty walking, Decreased mobility, Decreased endurance, Decreased coordination, Decreased safety awareness, Decreased knowledge of precautions, Decreased range of motion, Decreased skin integrity  Visit Diagnosis: Muscle weakness (generalized)  Diastasis recti     Problem List Patient Active Problem List   Diagnosis Date Noted  . Prostate cancer ([place]) 11/19/2019  . Osteoarthritis 11/14/2018  . Migraines 11/14/2018  . Anxiety and depression 11/14/2018  . HTN (hypertension) 11/14/2018  . Benign prostatic hyperplasia with urinary frequency 10/26/2016  . HLD (hyperlipidemia) 10/02/2015  . OSA (obstructive sleep apnea) 03/13/2015  . Seasonal allergies 03/13/2015  . GERD (gastroesophageal reflux disease) 03/13/2015  . Diabetes mellitus ([place]) 10/30/2013    Jerl Mina ,PT, DPT, E-[email or web address]  04/09/2020, 12:38 PM  [place] MAIN [facility] SERVICES [address] [place], [place], [address] Phone: [phone]   Fax:  [phone]  Name: Marc Aguilar MRN: [id number] Date of Birth: September 25, 1961

## 2020-04-15 ENCOUNTER — Encounter: Payer: No Typology Code available for payment source | Admitting: Physical Therapy

## 2020-04-22 ENCOUNTER — Encounter: Payer: No Typology Code available for payment source | Admitting: Physical Therapy

## 2020-04-29 ENCOUNTER — Ambulatory Visit: Payer: No Typology Code available for payment source | Attending: Urology | Admitting: Physical Therapy

## 2020-04-29 ENCOUNTER — Encounter: Payer: Self-pay | Admitting: Physical Therapy

## 2020-04-29 ENCOUNTER — Other Ambulatory Visit: Payer: Self-pay | Admitting: Internal Medicine

## 2020-04-29 ENCOUNTER — Encounter: Payer: No Typology Code available for payment source | Admitting: Physical Therapy

## 2020-04-29 DIAGNOSIS — M6281 Muscle weakness (generalized): Secondary | ICD-10-CM | POA: Diagnosis present

## 2020-04-29 DIAGNOSIS — M6208 Separation of muscle (nontraumatic), other site: Secondary | ICD-10-CM | POA: Insufficient documentation

## 2020-04-29 MED FILL — buPROPion HCL ER (XL) 150 M: 150 | 30 days supply | Qty: 30 | Fill #2

## 2020-04-29 MED FILL — SILDENAFIL CITRATE 20 MG TA: 20 | 30 days supply | Qty: 30 | Fill #4

## 2020-04-29 NOTE — Therapy (Signed)
[facility] [address] [place], [place], [address] Phone: [phone]   Fax:  [phone]  Physical Therapy Treatment  Patient Details  Name: Marc Aguilar MRN: [id number] Date of Birth: [birth year] Referring Provider (PT): Erlene Quan   Encounter Date: 04/29/2020   PT End of Session - 04/29/20 1722    Visit Number 10    Number of Visits 19    Date for PT Re-Evaluation 06/17/20   PN 6/22   PT Start Time 3614    PT Stop Time 1650    PT Time Calculation (min) 55 min    Activity Tolerance Patient tolerated treatment well    Behavior During Therapy [facility] for tasks assessed/performed           Past Medical History:  Diagnosis Date  . Anxiety   . Arthritis    "knees and hips" (02/28/2018)  . Cancer [facility])    prostate   . Chicken pox   . Depression   . GERD (gastroesophageal reflux disease)   . Heart murmur   . Hernia, abdominal   . High cholesterol   . History of gout   . Migraine    "probably monthly" (02/28/2018)  . OSA on CPAP   . Pneumonia    "once" (02/28/2018)  . Refusal of blood transfusions as patient is Jehovah's Witness   . Seasonal allergies   . Type II diabetes mellitus ([place])     Past Surgical History:  Procedure Laterality Date  . CARDIAC CATHETERIZATION  02/28/2018  . LEFT HEART CATH AND CORONARY ANGIOGRAPHY N/A 02/28/2018   Procedure: LEFT HEART CATH AND CORONARY ANGIOGRAPHY;  Surgeon: Sherren Mocha, MD;  Location: [place] CV LAB;  Service: Cardiovascular;  Laterality: N/A;  . ROBOT ASSISTED LAPAROSCOPIC RADICAL PROSTATECTOMY N/A 11/19/2019   Procedure: XI ROBOTIC ASSISTED LAPAROSCOPIC RADICAL PROSTATECTOMY WITH LYMPH NODE DISSECTION;  Surgeon: Hollice Espy, MD;  Location: [facility] ORS;  Service: Urology;  Laterality: N/A;  . WRIST GANGLION EXCISION Left 1984    There were no vitals filed for this visit.   Subjective Assessment - 04/29/20 1720    Subjective Patient presents to clinic with continued  reports of improving function. 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 Patient is interested in learning how to use his Pilates chair for transition to wellness based program.    Currently in Pain? No/denies           TREATMENT  Neuromuscular Re-education: Patient education on Pilates chair features and safety precautions.  Pilates based core stabilization and postural exercises including:  Seated Footwork  Standing Footwork  Mermaid  Hamstring I  Swan on chair  Hamstring I with chest press  Chest Press/Expansion Patient education on breath principles and importance of negotiating between finding stillness and allowing movement.   Patient educated throughout session on appropriate technique and form using multi-modal cueing, HEP, and activity modification. Patient articulated understanding and returned demonstration.  Patient Response to interventions: Patient denies any questions and notes he can feel fatigue in his abdominals.  ASSESSMENT Patient presents to clinic with excellent motivation to participate in therapy. Patient demonstrates deficits in postural stability against resistance. Patient able to achieve basic understanding of Pilates chair equipment and beginner level exercises during today's session and responded positively to educational and active interventions. Patient will benefit from continued skilled therapeutic intervention to address remaining deficits in postural stability against resistance in  order to preserve/improve linea alba tensegrity, increase function, and improve overall QOL.     PT Short Term Goals - 11/13/19 1502      PT SHORT TERM GOAL #1   Title Pt will demo decreased abdominal bulging to improve intraabdominal pressure for improved outcome for continence post op ( 1/26: 1 finger width separation)    Time 2    Period Weeks    Status Achieved    Target Date  11/21/19      PT SHORT TERM GOAL #2   Title Pt will demo proper body mechanics to minimize strain of abdominal/ spine/ pelvic floor    Time 1    Period Weeks    Status Achieved    Target Date 11/14/19      PT SHORT TERM GOAL #3   Title Pt will demo IND with deep core/ pelvic floor coordination, strengthening    Time 2    Period Weeks    Status Achieved    Target Date 11/21/19             PT Long Term Goals - 04/01/20 1514      PT LONG TERM GOAL #1   Title Pt will decrease urinary pads from 2-3 pads / day to < 1 pad a day in order to perform work duties ( 5/18:21: 1 day during day and 1 pad during night)    Time 8    Period Weeks    Status Achieved      PT LONG TERM GOAL #2   Title Pt will be IND with a fitness routine Scientist, research (physical sciences), pilates chair, resistance bands, dumbbells, treadmill)  in order to return to wellness and conditioning.    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 weight training and patient handling with less risk for lumbar injuries    Time 8    Period Weeks    Status Achieved      Additional Long Term Goals   Additional Long Term Goals Yes      PT LONG TERM GOAL #6   Title PT will increase his FOTO score for LBP from 69 pts to > 79pts in order to improve function  ( 5/18?21: 94 pts)    Time 10    Period Weeks     Status Achieved      PT LONG TERM GOAL #7   Title Pt will report improved function on FOTO for Urinary Problems "Today, because of your urinary problem, do you orwould you have any difficulty at all with doinghousehold chores (cooking, cleaning, laundry)?" Moderate Difficulty to "no or little bit of difficulty"  Time 10    Period Weeks    Status Achieved      PT LONG TERM GOAL #8   Title Pt will demo no fatigue and have proper alignment and technique without lumbor lordosis  with 5 push ups regular form in order returnt of fitness    Time 8    Period Weeks    Status New    Target Date 05/27/20                 Plan - 04/29/20 1730    Clinical Impression Statement Patient presents to clinic with excellent motivation to participate in therapy. Patient demonstrates deficits in postural stability against resistance. Patient able to achieve basic understanding of Pilates chair equipment and beginner level exercises during today's session and responded positively to educational and active interventions. Patient will benefit from continued skilled therapeutic intervention to address remaining deficits in postural stability against resistance in order to preserve/improve linea alba tensegrity, increase function, and improve overall QOL.    Examination-Activity Limitations Continence    Stability/Clinical Decision Making Evolving/Moderate complexity    Rehab Potential Good    PT Frequency 1x / week    PT Duration Other (comment)   10   PT Treatment/Interventions Balance training;Therapeutic exercise;Neuromuscular re-education;Therapeutic activities;Moist Heat;Patient/family education;Manual techniques    Consulted and Agree with Plan of Care Patient           Patient will benefit from skilled therapeutic intervention in order to improve the following deficits and impairments:  Impaired sensation, Difficulty walking, Decreased mobility, Decreased endurance, Decreased coordination,  Decreased safety awareness, Decreased knowledge of precautions, Decreased range of motion, Decreased skin integrity  Visit Diagnosis: Muscle weakness (generalized)  Diastasis recti     Problem List Patient Active Problem List   Diagnosis Date Noted  . Prostate cancer ([place]) 11/19/2019  . Osteoarthritis 11/14/2018  . Migraines 11/14/2018  . Anxiety and depression 11/14/2018  . HTN (hypertension) 11/14/2018  . Benign prostatic hyperplasia with urinary frequency 10/26/2016  . HLD (hyperlipidemia) 10/02/2015  . OSA (obstructive sleep apnea) 03/13/2015  . Seasonal allergies 03/13/2015  . GERD (gastroesophageal reflux disease) 03/13/2015  . Diabetes mellitus ([place]) 10/30/2013   Myles Gip PT, DPT [phone] 04/29/2020, 5:30 PM  [place] [facility] [address] [place], [place], [address] Phone: [phone]   Fax:  [phone]  Name: Marc Aguilar MRN: [id number] Date of Birth: 08/19/1961

## 2020-04-30 ENCOUNTER — Other Ambulatory Visit: Payer: Self-pay | Admitting: Internal Medicine

## 2020-04-30 NOTE — Telephone Encounter (Signed)
Fioricet last filled 02/06/2020... please advise

## 2020-05-01 MED FILL — BUTALB-ACETAMIN-CAFF 50-325: 50-325-40 | 3 days supply | Qty: 20 | Fill #0

## 2020-05-01 MED FILL — CITALOPRAM HBR 40 MG TABLET: 40 | 90 days supply | Qty: 90 | Fill #0

## 2020-05-01 MED FILL — hydrOXYzine HCL 25 MG TABS: 25 | 30 days supply | Qty: 30 | Fill #0

## 2020-05-01 MED FILL — metFORMIN HCL 1000 MG TABS: 1000 | 90 days supply | Qty: 180 | Fill #0

## 2020-05-06 ENCOUNTER — Encounter: Payer: No Typology Code available for payment source | Admitting: Physical Therapy

## 2020-05-13 ENCOUNTER — Encounter: Payer: No Typology Code available for payment source | Admitting: Physical Therapy

## 2020-05-13 ENCOUNTER — Ambulatory Visit: Payer: No Typology Code available for payment source | Admitting: Physical Therapy

## 2020-05-13 ENCOUNTER — Other Ambulatory Visit: Payer: Self-pay

## 2020-05-13 DIAGNOSIS — M6281 Muscle weakness (generalized): Secondary | ICD-10-CM | POA: Diagnosis not present

## 2020-05-13 DIAGNOSIS — M6208 Separation of muscle (nontraumatic), other site: Secondary | ICD-10-CM

## 2020-05-14 ENCOUNTER — Encounter: Payer: Self-pay | Admitting: Physical Therapy

## 2020-05-14 NOTE — Therapy (Signed)
[facility] [address] [place], [place], [address] Phone: [phone]   Fax:  [phone]  Physical Therapy Treatment  Patient Details  Name: Marc Aguilar MRN: [id number] Date of Birth: Jul 18, 1961 Referring Provider (PT): Erlene Quan   Encounter Date: 05/13/2020   PT End of Session - 05/14/20 1243    Visit Number 11    Number of Visits 19    Date for PT Re-Evaluation 06/17/20   PN 6/22   PT Start Time 1600    PT Stop Time 1640    PT Time Calculation (min) 40 min    Activity Tolerance Patient tolerated treatment well    Behavior During Therapy [facility] for tasks assessed/performed           Past Medical History:  Diagnosis Date  . Anxiety   . Arthritis    "knees and hips" (02/28/2018)  . Cancer [facility])    prostate   . Chicken pox   . Depression   . GERD (gastroesophageal reflux disease)   . Heart murmur   . Hernia, abdominal   . High cholesterol   . History of gout   . Migraine    "probably monthly" (02/28/2018)  . OSA on CPAP   . Pneumonia    "once" (02/28/2018)  . Refusal of blood transfusions as patient is Jehovah's Witness   . Seasonal allergies   . Type II diabetes mellitus ([place])     Past Surgical History:  Procedure Laterality Date  . CARDIAC CATHETERIZATION  02/28/2018  . LEFT HEART CATH AND CORONARY ANGIOGRAPHY N/A 02/28/2018   Procedure: LEFT HEART CATH AND CORONARY ANGIOGRAPHY;  Surgeon: Sherren Mocha, MD;  Location: [place] [facility];  Service: Cardiovascular;  Laterality: N/A;  . ROBOT ASSISTED LAPAROSCOPIC RADICAL PROSTATECTOMY N/A 11/19/2019   Procedure: XI ROBOTIC ASSISTED LAPAROSCOPIC RADICAL PROSTATECTOMY WITH LYMPH NODE DISSECTION;  Surgeon: Hollice Espy, MD;  Location: [facility] ORS;  Service: Urology;  Laterality: N/A;  . WRIST GANGLION EXCISION Left 1984    There were no vitals filed for this visit.   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 Patient denies any return of UI symptoms.    Currently in Pain? No/denies           TREATMENT  Neuromuscular Re-education: Patient education on Pilates reformer features and safety precautions.  Pilates based core stabilization and postural exercises including:  Supine arm work: triceps, lats, posterior delt  Kneeling arm work facing front  Kneeling arm work facing back  Kneeling arm work facing side  Kneeling abdominals front  Kneeling abdominals back Patient education on breath principles and importance of negotiating between finding stillness and allowing movement.   Patient educated throughout session on appropriate technique and form using multi-modal cueing, HEP, and activity modification. Patient articulated understanding and returned demonstration.  Patient Response to interventions: Patient denies any questions and notes he can feel good work in arms/back and core.  ASSESSMENT Patient presents to clinic with excellent motivation to participate in therapy. Patient demonstrates deficits in postural stability against resistance. Patient able to achieve basic understanding of Pilates reformer equipment and beginner level exercises with primary focus on core strengthening and UE strengthening during today's session and responded positively to educational and active interventions. Patient may benefit  from continued skilled therapeutic intervention to address any remaining deficits in postural stability against resistance in order to preserve/improve linea alba tensegrity, increase function, and improve overall QOL.    PT Short Term Goals - 11/13/19 1502      PT SHORT TERM GOAL #1   Title Pt will demo decreased abdominal bulging to improve intraabdominal pressure for improved outcome for continence post op ( 1/26: 1 finger  width separation)    Time 2    Period Weeks    Status Achieved    Target Date 11/21/19      PT SHORT TERM GOAL #2   Title Pt will demo proper body mechanics to minimize strain of abdominal/ spine/ pelvic floor    Time 1    Period Weeks    Status Achieved    Target Date 11/14/19      PT SHORT TERM GOAL #3   Title Pt will demo IND with deep core/ pelvic floor coordination, strengthening    Time 2    Period Weeks    Status Achieved    Target Date 11/21/19             PT Long Term Goals - 04/01/20 1514      PT LONG TERM GOAL #1   Title Pt will decrease urinary pads from 2-3 pads / day to < 1 pad a day in order to perform work duties ( 5/18:21: 1 day during day and 1 pad during night)    Time 8    Period Weeks    Status Achieved      PT LONG TERM GOAL #2   Title Pt will be IND with a fitness routine Scientist, research (physical sciences), pilates chair, resistance bands, dumbbells, treadmill)  in order to return to wellness and conditioning.    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 weight training and patient handling with less risk for lumbar injuries    Time 8    Period Weeks    Status Achieved      Additional Long Term Goals   Additional Long Term Goals Yes      PT LONG TERM GOAL #6   Title PT will increase his FOTO score for LBP from 69 pts to > 79pts  in order to improve function  ( 5/18?21: 94 pts)    Time 10    Period Weeks    Status Achieved      PT LONG TERM GOAL #7   Title Pt will report improved function on FOTO for Urinary Problems "Today, because of your urinary problem, do you orwould you have any difficulty at all with  doinghousehold chores (cooking, cleaning, laundry)?" Moderate Difficulty to "no or little bit of difficulty"    Time 10    Period Weeks    Status Achieved      PT LONG TERM GOAL #8   Title Pt will demo no fatigue and have proper alignment and technique without lumbor lordosis  with 5 push ups regular form in order returnt of fitness    Time 8    Period Weeks    Status New    Target Date 05/27/20                 Plan - 05/14/20 1244    Clinical Impression Statement Patient presents to clinic with excellent motivation to participate in therapy. Patient demonstrates deficits in postural stability against resistance. Patient able to achieve basic understanding of Pilates reformer equipment and beginner level exercises with primary focus on core strengthening and UE strengthening during today's session and responded positively to educational and active interventions. Patient may benefit from continued skilled therapeutic intervention to address any remaining deficits in postural stability against resistance in order to preserve/improve linea alba tensegrity, increase function, and improve overall QOL.    Examination-Activity Limitations Continence    Stability/Clinical Decision Making Evolving/Moderate complexity    Rehab Potential Good    PT Frequency 1x / week    PT Duration Other (comment)   10   PT Treatment/Interventions Balance training;Therapeutic exercise;Neuromuscular re-education;Therapeutic activities;Moist Heat;Patient/family education;Manual techniques    Consulted and Agree with Plan of Care Patient           Patient will benefit from skilled therapeutic intervention in order to improve  the following deficits and impairments:  Impaired sensation, Difficulty walking, Decreased mobility, Decreased endurance, Decreased coordination, Decreased safety awareness, Decreased knowledge of precautions, Decreased range of motion, Decreased skin integrity  Visit Diagnosis: Muscle weakness (generalized)  Diastasis recti     Problem List Patient Active Problem List   Diagnosis Date Noted  . Prostate cancer ([place]) 11/19/2019  . Osteoarthritis 11/14/2018  . Migraines 11/14/2018  . Anxiety and depression 11/14/2018  . HTN (hypertension) 11/14/2018  . Benign prostatic hyperplasia with urinary frequency 10/26/2016  . HLD (hyperlipidemia) 10/02/2015  . OSA (obstructive sleep apnea) 03/13/2015  . Seasonal allergies 03/13/2015  . GERD (gastroesophageal reflux disease) 03/13/2015  . Diabetes mellitus ([place]) 10/30/2013   Myles Gip PT, DPT [phone] 05/14/2020, 12:49 PM  [facility] [address] [place], [place], [address] Phone: [phone]   Fax:  [phone]  Name: DWAIN HUHN MRN: [id number] Date of Birth: 10-16-1961

## 2020-05-19 ENCOUNTER — Telehealth: Payer: No Typology Code available for payment source | Admitting: Physician Assistant

## 2020-05-19 DIAGNOSIS — M5442 Lumbago with sciatica, left side: Secondary | ICD-10-CM | POA: Diagnosis not present

## 2020-05-19 MED ORDER — CYCLOBENZAPRINE HCL 10 MG PO TABS: 10.0000 mg | ORAL_TABLET | Freq: Three times a day (TID) | ORAL | 0 refills | Status: DC | PRN

## 2020-05-19 MED ORDER — NAPROXEN 500 MG PO TABS: 500.0000 mg | ORAL_TABLET | Freq: Two times a day (BID) | ORAL | 0 refills | Status: AC

## 2020-05-19 MED FILL — CYCLOBENZAPRINE HCL 10 MG T: 10 | 10 days supply | Qty: 30 | Fill #0

## 2020-05-19 MED FILL — NAPROXEN 500 MG TABLET: 500 | 7 days supply | Qty: 14 | Fill #0

## 2020-05-19 NOTE — Progress Notes (Signed)
We are sorry that you are not feeling well.  Here is how we plan to help!  Based on what you have shared with me it looks like you mostly have acute back pain.  Acute back pain is defined as musculoskeletal pain that can resolve in 1-3 weeks with conservative treatment.  I have prescribed Naprosyn 500 mg take one by mouth twice a day non-steroid anti-inflammatory (NSAID) as well as Flexeril 10 mg every eight hours as needed which is a muscle relaxer. You should not drive, work, or operate machinery while taking this medication as it can make you very drowsy.     Some patients experience stomach irritation or in increased heartburn with anti-inflammatory drugs.  Please keep in mind that muscle relaxer's can cause fatigue and should not be taken while at work or driving.  Back pain is very common.  The pain often gets better over time.  The cause of back pain is usually not dangerous.  Most people can learn to manage their back pain on their own.  Home Care  Stay active.  Start with short walks on flat ground if you can.  Try to walk farther each day.  Do not sit, drive or stand in one place for more than 30 minutes.  Do not stay in bed.  Do not avoid exercise or work.  Activity can help your back heal faster.  Be careful when you bend or lift an object.  Bend at your knees, keep the object close to you, and do not twist.  Sleep on a firm mattress.  Lie on your side, and bend your knees.  If you lie on your back, put a pillow under your knees.  Only take medicines as told by your doctor.  Put ice on the injured area.  Put ice in a plastic bag  Place a towel between your skin and the bag  Leave the ice on for 15-20 minutes, 3-4 times a day for the first 2-3 days. 210 After that, you can switch between ice and heat packs.  Ask your doctor about back exercises or massage.  Avoid feeling anxious or stressed.  Find good ways to deal with stress, such as exercise.  Get Help Right Way  If:  Your pain does not go away with rest or medicine.  Your pain does not go away in 1 week.  You have new problems.  You do not feel well.  The pain spreads into your legs.  You cannot control when you poop (bowel movement) or pee (urinate)  You feel sick to your stomach (nauseous) or throw up (vomit)  You have belly (abdominal) pain.  You feel like you may pass out (faint).  If you develop a fever.  Make Sure you:  Understand these instructions.  Will watch your condition  Will get help right away if you are not doing well or get worse.  Your e-visit answers were reviewed by a board certified advanced clinical practitioner to complete your personal care plan.  Depending on the condition, your plan could have included both over the counter or prescription medications.  If there is a problem please reply  once you have received a response from your provider.  Your safety is important to [place].  If you have drug allergies check your prescription carefully.    You can use MyChart to ask questions about todays visit, request a non-urgent call back, or ask for a work or school excuse for 24 hours related to  this e-Visit. If it has been greater than 24 hours you will need to follow up with your provider, or enter a new e-Visit to address those concerns.  You will get an e-mail in the next two days asking about your experience.  I hope that your e-visit has been valuable and will speed your recovery. Thank you for using e-visits.  Approximately 5 minutes was spent documenting and reviewing patient's chart.

## 2020-05-20 ENCOUNTER — Other Ambulatory Visit: Payer: Self-pay

## 2020-05-20 ENCOUNTER — Ambulatory Visit: Payer: No Typology Code available for payment source | Attending: Urology | Admitting: Physical Therapy

## 2020-05-20 DIAGNOSIS — M6281 Muscle weakness (generalized): Secondary | ICD-10-CM | POA: Insufficient documentation

## 2020-05-20 DIAGNOSIS — M6208 Separation of muscle (nontraumatic), other site: Secondary | ICD-10-CM | POA: Diagnosis present

## 2020-05-20 NOTE — Patient Instructions (Signed)
Sideplank:  Forearm at 45 deg, push at the edge of the foot ( ankle 90 deg). Top leg bent, shin bone perpendicular  Chin tuck, l;ine up elbow with tailbone and heel liek awall is behidn you  ___  Birddog   Table top position,  6 points of contact: paw hands, knees hip width apart, toes tucked under  Shoulders down and back like squeezing armpits  Not sagging back   L arm up , thumbs up, arm is out like a half"V" shoulder blade slides down and back  R knee straight, toes tucked on the ground,  Lengthen whole spine as if yard stick is balanced on spine, chin tucked   L + R = 1 rep 10 reps   X 2    ___  Dolphin Push through feet and heel down, feel it in your calves,  Position is not flat but with buttocks up like down dog  Chin tucked  Not need to move forward far   ___

## 2020-05-20 NOTE — Therapy (Signed)
[place] MAIN [facility] SERVICES [address] [place], [place], [address] Phone: [phone]   Fax:  [phone]  Physical Therapy Treatment  Patient Details  Name: Marc Aguilar MRN: [id number] Date of Birth: 03-05-1961 Referring Provider (PT): Erlene Quan   Encounter Date: 05/20/2020   PT End of Session - 05/20/20 1707    Visit Number 12    Number of Visits 19    Date for PT Re-Evaluation 06/17/20   PN 6/22   PT Start Time 1700    PT Stop Time 1755    PT Time Calculation (min) 55 min    Activity Tolerance Patient tolerated treatment well    Behavior During Therapy [facility] for tasks assessed/performed           Past Medical History:  Diagnosis Date  . Anxiety   . Arthritis    "knees and hips" (02/28/2018)  . Cancer [facility])    prostate   . Chicken pox   . Depression   . GERD (gastroesophageal reflux disease)   . Heart murmur   . Hernia, abdominal   . High cholesterol   . History of gout   . Migraine    "probably monthly" (02/28/2018)  . OSA on CPAP   . Pneumonia    "once" (02/28/2018)  . Refusal of blood transfusions as patient is Jehovah's Witness   . Seasonal allergies   . Type II diabetes mellitus ([place])     Past Surgical History:  Procedure Laterality Date  . CARDIAC CATHETERIZATION  02/28/2018  . LEFT HEART CATH AND CORONARY ANGIOGRAPHY N/A 02/28/2018   Procedure: LEFT HEART CATH AND CORONARY ANGIOGRAPHY;  Surgeon: Sherren Mocha, MD;  Location: [place] [facility];  Service: Cardiovascular;  Laterality: N/A;  . ROBOT ASSISTED LAPAROSCOPIC RADICAL PROSTATECTOMY N/A 11/19/2019   Procedure: XI ROBOTIC ASSISTED LAPAROSCOPIC RADICAL PROSTATECTOMY WITH LYMPH NODE DISSECTION;  Surgeon: Hollice Espy, MD;  Location: [facility] ORS;  Service: Urology;  Laterality: N/A;  . WRIST GANGLION EXCISION Left 1984    There were no vitals filed for this visit.   Subjective Assessment - 05/20/20 1750    Subjective Pt reports he has been using the  pilate techniques on his machine after working with Dr. Vernard Gambles. Pt has had very little leakage              [facility] PT Assessment - 05/20/20 1745      Observation/Other Assessments   Observations less lumbar lordosis in plantigrade push up ( thus, withholding from HEP), side plank with poor alignment, no lumbar lordosis in wall dolphin plank, bird dog with poor form                           [facility] Adult PT Treatment/Exercise - 05/20/20 1748      Therapeutic Activites    Other Therapeutic Activities reassessed FOTO       Neuro Re-ed    Neuro Re-ed Details  cued for alignment in exercises noted in assessment                     PT Short Term Goals - 11/13/19 1502      PT SHORT TERM GOAL #1   Title Pt will demo decreased abdominal bulging to improve intraabdominal pressure for improved outcome for continence post op ( 1/26: 1 finger width separation)    Time 2    Period Weeks    Status Achieved  Target Date 11/21/19      PT SHORT TERM GOAL #2   Title Pt will demo proper body mechanics to minimize strain of abdominal/ spine/ pelvic floor    Time 1    Period Weeks    Status Achieved    Target Date 11/14/19      PT SHORT TERM GOAL #3   Title Pt will demo IND with deep core/ pelvic floor coordination, strengthening    Time 2    Period Weeks    Status Achieved    Target Date 11/21/19             PT Long Term Goals - 04/01/20 1514      PT LONG TERM GOAL #1   Title Pt will decrease urinary pads from 2-3 pads / day to < 1 pad a day in order to perform work duties ( 5/18:21: 1 day during day and 1 pad during night)    Time 8    Period Weeks    Status Achieved      PT LONG TERM GOAL #2   Title Pt will be IND with a fitness routine Scientist, research (physical sciences), pilates chair, resistance bands, dumbbells, treadmill)  in order to return to wellness and conditioning.    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 weight training and patient handling with less risk for lumbar injuries    Time 8    Period Weeks    Status Achieved      Additional Long Term Goals   Additional Long Term Goals Yes      PT LONG TERM GOAL #6   Title PT will increase his FOTO score for LBP from 69 pts to > 79pts in order to improve function  ( 5/18?21: 94 pts)    Time 10    Period Weeks    Status Achieved      PT LONG TERM GOAL #7   Title Pt will report improved function on FOTO for Urinary Problems "Today, because of your urinary problem, do you orwould you have any difficulty at all with doinghousehold chores (cooking, cleaning, laundry)?" Moderate Difficulty to "no or little bit of difficulty"    Time 10    Period Weeks    Status Achieved      PT LONG TERM GOAL #8   Title Pt will demo no fatigue and have proper alignment and technique without lumbor lordosis  with 5 push ups regular form in order returnt of fitness    Time 8    Period Weeks    Status New    Target Date 05/27/20  Plan - 05/20/20 1717    Clinical Impression Statement Pt demo'd improved strength with less lumbar lordosis and advanced to birddog exercise to further increase abdominal strength/posterior chain. Pt required cues for proper alignment technique in sideplank, birddog, and dolphin plank. Pt was not ready to advance with push ups yet  has pt showed increased lumbar lordosis in plantigrade plank. Pt continues to benefit from skilled PT    Examination-Activity Limitations Continence    Stability/Clinical Decision Making Evolving/Moderate complexity    Rehab Potential Good    PT Frequency 1x / week    PT Duration Other (comment)   10   PT Treatment/Interventions Balance training;Therapeutic exercise;Neuromuscular re-education;Therapeutic activities;Moist Heat;Patient/family education;Manual techniques    Consulted and Agree with Plan of Care Patient           Patient will benefit from skilled therapeutic intervention in order to improve the following deficits and impairments:  Impaired sensation, Difficulty walking, Decreased mobility, Decreased endurance, Decreased coordination, Decreased safety awareness, Decreased knowledge of precautions, Decreased range of motion, Decreased skin integrity  Visit Diagnosis: Diastasis recti  Muscle weakness (generalized)     Problem List Patient Active Problem List   Diagnosis Date Noted  . Prostate cancer ([place]) 11/19/2019  . Osteoarthritis 11/14/2018  . Migraines 11/14/2018  . Anxiety and depression 11/14/2018  . HTN (hypertension) 11/14/2018  . Benign prostatic hyperplasia with urinary frequency 10/26/2016  . HLD (hyperlipidemia) 10/02/2015  . OSA (obstructive sleep apnea) 03/13/2015  . Seasonal allergies 03/13/2015  . GERD (gastroesophageal reflux disease) 03/13/2015  . Diabetes mellitus ([place]) 10/30/2013    Jerl Mina ,PT, DPT, E-RYT  05/20/2020, 6:01 PM  [place] MAIN [facility] SERVICES [address] [place], [place], [address] Phone: [phone]   Fax:  [phone]  Name: Marc Aguilar MRN: [id number] Date of Birth: 1961/04/03

## 2020-05-23 ENCOUNTER — Telehealth: Payer: No Typology Code available for payment source | Admitting: Family

## 2020-05-23 DIAGNOSIS — T1592XA Foreign body on external eye, part unspecified, left eye, initial encounter: Secondary | ICD-10-CM

## 2020-05-23 NOTE — Progress Notes (Signed)
Based on what you shared with me, I feel your condition warrants further evaluation and I recommend that you be seen for a face to face office visit.   NOTE: If you entered your credit card information for this eVisit, you will not be charged. You may see a "hold" on your card for the $35 but that hold will drop off and you will not have a charge processed.   If you are having a true medical emergency please call 911.      For an urgent face to face visit, [place] has five urgent care centers for your convenience:      NEW:  [facility] [place] Get Driving Directions [phone] [address] [place], [place] [address] . 10 am - 6pm Monday - Friday    [facility] Get Driving Directions [phone] [address] [place], [place] [address] . 10 am to 8 pm Monday-Friday . 12 pm to 8 pm Saturday-Sunday     [facility] Get Driving Directions [phone] [address], [address] [place], [place] [address] . 8 am to 8 pm Monday-Friday . 9 am to 6 pm Saturday . 11 am to 6 pm Sunday     [facility] [place] Get Driving Directions  [phone] [address] [place], [place] [address] . 8 am to 8 pm Monday-Friday . 8 am to 4 pm Saturday-Sunday   [facility] [place] Get Driving Directions [phone] [address] [place],  [address] . 12 pm to 6 pm Monday-Friday      Your e-visit answers were reviewed by a board certified advanced clinical practitioner to complete your personal care plan.  Thank you for using e-Visits.     

## 2020-06-02 MED FILL — AMLODIPINE BESYLATE 5 MG TA: 5 | 90 days supply | Qty: 90 | Fill #2

## 2020-06-26 ENCOUNTER — Other Ambulatory Visit: Payer: Self-pay

## 2020-06-26 ENCOUNTER — Other Ambulatory Visit: Payer: No Typology Code available for payment source

## 2020-06-26 DIAGNOSIS — C61 Malignant neoplasm of prostate: Secondary | ICD-10-CM

## 2020-06-27 LAB — PSA: Prostate Specific Ag, Serum: <0.1 ng/mL (ref 0.0–4.0)

## 2020-07-01 ENCOUNTER — Ambulatory Visit: Payer: No Typology Code available for payment source | Attending: Urology | Admitting: Physical Therapy

## 2020-07-01 ENCOUNTER — Other Ambulatory Visit: Payer: Self-pay

## 2020-07-01 DIAGNOSIS — M6208 Separation of muscle (nontraumatic), other site: Secondary | ICD-10-CM | POA: Diagnosis present

## 2020-07-01 DIAGNOSIS — M6281 Muscle weakness (generalized): Secondary | ICD-10-CM | POA: Insufficient documentation

## 2020-07-02 NOTE — Therapy (Signed)
[place] MAIN [facility] SERVICES [address] [place], [place], [address] Phone: [phone]   Fax:  [phone]  Physical Therapy Treatment / Discharge Summary    Patient Details  Name: Marc Aguilar MRN: [id number] Date of Birth: 10-May-1961 Referring Provider (PT): Erlene Quan   Encounter Date: 07/01/2020   PT End of Session - 07/01/20 1517    Visit Number 13    Number of Visits 19    Date for PT Re-Evaluation 07/01/20   PN 6/22   PT Start Time 1510    PT Stop Time 1600    PT Time Calculation (min) 50 min    Activity Tolerance Patient tolerated treatment well    Behavior During Therapy [facility] for tasks assessed/performed           Past Medical History:  Diagnosis Date  . Anxiety   . Arthritis    "knees and hips" (02/28/2018)  . Cancer [facility])    prostate   . Chicken pox   . Depression   . GERD (gastroesophageal reflux disease)   . Heart murmur   . Hernia, abdominal   . High cholesterol   . History of gout   . Migraine    "probably monthly" (02/28/2018)  . OSA on CPAP   . Pneumonia    "once" (02/28/2018)  . Refusal of blood transfusions as patient is Jehovah's Witness   . Seasonal allergies   . Type II diabetes mellitus ([place])     Past Surgical History:  Procedure Laterality Date  . CARDIAC CATHETERIZATION  02/28/2018  . LEFT HEART CATH AND CORONARY ANGIOGRAPHY N/A 02/28/2018   Procedure: LEFT HEART CATH AND CORONARY ANGIOGRAPHY;  Surgeon: Sherren Mocha, MD;  Location: [facility];  Service: Cardiovascular;  Laterality: N/A;  . ROBOT ASSISTED LAPAROSCOPIC RADICAL PROSTATECTOMY N/A 11/19/2019   Procedure: XI ROBOTIC ASSISTED LAPAROSCOPIC RADICAL PROSTATECTOMY WITH LYMPH NODE DISSECTION;  Surgeon: Hollice Espy, MD;  Location: [facility] ORS;  Service: Urology;  Laterality: N/A;  . WRIST GANGLION EXCISION Left 1984    There were no vitals filed for this visit.   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  SLS heel raises requried UE support                           [facility] Adult PT Treatment/Exercise - 07/02/20 1050      Therapeutic Activites    Other Therapeutic Activities explained the importance of strengthening PF and use of trampoline as an alternative to jogging to build up plantar fascia and balance., explaining to pt his current weakness in propulsion with feet arches needed for running    reassessed goals , FOTO     Neuro Re-ed    Neuro Re-ed Details  excessive cues for modified push up with  more coactivation of upper and lower kinetic chain , less lumbar lordosis in proper push up,  required cue for shorter level ( quadriped, scapular co-activation , proper hand alignment)                     PT Short Term Goals - 11/13/19 1502      PT SHORT TERM GOAL #1   Title Pt will demo decreased abdominal bulging to improve intraabdominal pressure for improved outcome for continence post op ( 1/26: 1 finger width separation)    Time 2    Period Weeks    Status Achieved    Target Date 11/21/19      PT SHORT TERM GOAL #2   Title Pt will demo proper body mechanics to minimize strain of abdominal/ spine/ pelvic floor    Time 1    Period Weeks    Status Achieved    Target Date 11/14/19      PT SHORT TERM GOAL #3   Title Pt will demo IND with deep core/ pelvic floor coordination, strengthening    Time 2    Period Weeks    Status Achieved    Target Date 11/21/19             PT  Long Term Goals - 07/01/20 1517      PT LONG TERM GOAL #1   Title Pt will decrease urinary pads from 2-3 pads / day to < 1 pad a day in order to perform work duties ( 5/18:21: 1 day during day and 1 pad during night)    Time 8    Period Weeks    Status Achieved      PT LONG TERM GOAL #2   Title Pt will be IND with a fitness routine Scientist, research (physical sciences), pilates chair, resistance bands, dumbbells, treadmill)  in order to return to wellness and conditioning.    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 weight training and patient handling with less risk for lumbar injuries    Time 8    Period Weeks    Status Achieved      PT LONG TERM GOAL #6   Title PT will increase his FOTO score for LBP from 69 pts to > 79pts in order to improve function  ( 03/04/20: 94 pts)    Time 10    Period Weeks    Status Achieved      PT LONG TERM GOAL #7   Title Pt will report improved function on FOTO for Urinary Problems "Today, because of your urinary problem, do you orwould you have any difficulty at  all with doinghousehold chores (cooking, cleaning, laundry)?" Moderate Difficulty to "no or little bit of difficulty"    Time 10    Period Weeks    Status Achieved      PT LONG TERM GOAL #8   Title Pt will demo no fatigue and have proper alignment and technique without lumbor  lordosis  with 5 push ups regular form in order returnt of fitness    Time 8    Period Weeks    Status Achieved                 Plan - 07/01/20 1543    Clinical Impression Statement Pt has achieved 100% of his goals.  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   Pt demo'd improved spinal and pelvic girdle stability, resolved diastasis recti, and IND with fitness routine and work body mechanics to minimize straining abdominopelvic areas.   FOTO scores improved for lumbar from 69 pts to 94 pts, urinary from 25 pts to 0 pts which indicate improved signficantly improved function.   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 Pt was educated on ways to strengthen plantar fascia and SLS balance to prep for gradual return to running. Advised pt needs to use trampoline for more strengthening before returning to running. Pt worked with another Pelvic PT for training with pilates machine which pt plans to continue using for strengthening. Pt is ready for d/c at this time.     Examination-Activity Limitations Continence    Stability/Clinical Decision Making Evolving/Moderate complexity    Rehab Potential Good    PT Frequency 1x / week    PT Duration Other (comment)   10   PT Treatment/Interventions Balance training;Therapeutic exercise;Neuromuscular re-education;Therapeutic activities;Moist Heat;Patient/family education;Manual techniques    Consulted and Agree with Plan of Care Patient           Patient will benefit from skilled therapeutic intervention in order to improve the following deficits and impairments:  Impaired  sensation, Difficulty walking, Decreased mobility, Decreased endurance, Decreased coordination, Decreased safety awareness, Decreased knowledge of precautions, Decreased range of motion, Decreased skin integrity  Visit Diagnosis: Muscle weakness (generalized)  Diastasis recti     Problem List Patient Active Problem List   Diagnosis Date Noted  . Prostate cancer ([place]) 11/19/2019  . Osteoarthritis 11/14/2018  . Migraines 11/14/2018  . Anxiety and depression 11/14/2018  . HTN (hypertension) 11/14/2018  . Benign prostatic hyperplasia with urinary frequency 10/26/2016  . HLD (hyperlipidemia) 10/02/2015  . OSA (obstructive sleep apnea) 03/13/2015  . Seasonal allergies 03/13/2015  . GERD (gastroesophageal reflux disease) 03/13/2015  . Diabetes mellitus ([place]) 10/30/2013    Jerl Mina ,PT, DPT, E-RYT  07/02/2020, 11:01 AM  [place] MAIN [facility] SERVICES [address] [place], [place], [address] Phone: [phone]   Fax:  [phone]  Name: MACAI SISNEROS MRN: [id number] Date of Birth: Oct 19, 1960

## 2020-07-08 ENCOUNTER — Encounter: Payer: Self-pay | Admitting: Urology

## 2020-07-08 ENCOUNTER — Ambulatory Visit: Payer: Self-pay | Admitting: Urology

## 2020-07-10 ENCOUNTER — Telehealth: Payer: Self-pay

## 2020-07-10 NOTE — Telephone Encounter (Signed)
-----   Message from Hollice Espy, MD sent at 07/09/2020 11:53 AM EDT ----- Marc Aguilar no showed to his follow-up yesterday. The good news is that his PSA is undetectable which is awesome. From the physical therapy notes, looks like he is doing well. Given his history of high risk disease as well as positive margin, I think he should have a PSA at no less than every 6 months. Please schedule PSA at visit and follow-up with me in 6 months if he does not want to reschedule this visit.  Hollice Espy, MD

## 2020-07-11 NOTE — Telephone Encounter (Signed)
mychart notification sent 

## 2020-07-11 NOTE — Telephone Encounter (Signed)
-----   Message from Hollice Espy, MD sent at 07/09/2020 11:53 AM EDT ----- Mr. Mcfarlan no showed to his follow-up yesterday. The good news is that his PSA is undetectable which is awesome. From the physical therapy notes, looks like he is doing well. Given his history of high risk disease as well as positive margin, I think he should have a PSA at no less than every 6 months. Please schedule PSA at visit and follow-up with me in 6 months if he does not want to reschedule this visit.  Hollice Espy, MD

## 2020-07-17 ENCOUNTER — Ambulatory Visit (INDEPENDENT_AMBULATORY_CARE_PROVIDER_SITE_OTHER): Payer: No Typology Code available for payment source | Admitting: Urology

## 2020-07-17 ENCOUNTER — Other Ambulatory Visit: Payer: Self-pay

## 2020-07-17 ENCOUNTER — Encounter: Payer: Self-pay | Admitting: Urology

## 2020-07-17 VITALS — BP 148/97 | HR 86 | Ht 71.0 in | Wt 246.0 lb

## 2020-07-17 DIAGNOSIS — C61 Malignant neoplasm of prostate: Secondary | ICD-10-CM

## 2020-07-17 DIAGNOSIS — N393 Stress incontinence (female) (male): Secondary | ICD-10-CM

## 2020-07-17 DIAGNOSIS — N5231 Erectile dysfunction following radical prostatectomy: Secondary | ICD-10-CM | POA: Diagnosis not present

## 2020-07-17 NOTE — Progress Notes (Signed)
07/17/2020 8:24 AM   Marc Aguilar [birth year] [id number]  Referring provider: [name], NP [address] [place],  [place] [address]  Chief Complaint  Patient presents with  . Prostate Cancer    HPI: 59 year old male with personal history of prostate cancer status post prostatectomy 11/2019 who returns today for follow-up.  Prostate biopsy resultsreveal evidence of high risk prostate cancer up to Gleason4+4,involving 5 of 12 cores on the left and one isolated low risk core on the right. Highest volume 80% of 4+4 at the left base.  Metastatic evaluation including CT abdomen pelvis with contrast as well as bone scan negative for any evidence of metastatic disease.  He underwent robotic assisted laparoscopic radical prostatectomy on 11/19/2019. His surgical pathology 11/19/19 gleason 4+ 3 with less than 5% of a tertiary pattern 5 +EPE nonfocal - SB +margin on left posterior -lymph nodes pT3a pN0.   PSA remains undetectable as of Jun 26, 2020.  In terms of continence, he is completed work with physical therapy. He is doing extremely well with minimal leakage.  The only time he leaks is when he is worked a long shift got the night and is extremely fatigued.  Leakage is minimal on this occasion.  He does get up once at night to urinate around 5 AM which sometimes is bothersome to him.  This is stable from preop.  He continues to have some partial erections and is only used generic sildenafil a few times.  This was effective.  He is not particularly sexually active at the time being and is not a priority for him right now.    PMH: Past Medical History:  Diagnosis Date  . Anxiety   . Arthritis    "knees and hips" (02/28/2018)  . Cancer [facility])    prostate   . Chicken pox   . Depression   . GERD (gastroesophageal reflux disease)   . Heart murmur   . Hernia, abdominal   . High cholesterol   . History of gout   . Migraine    "probably monthly" (02/28/2018)  . OSA on  CPAP   . Pneumonia    "once" (02/28/2018)  . Refusal of blood transfusions as patient is Jehovah's Witness   . Seasonal allergies   . Type II diabetes mellitus ([place])     Surgical History: Past Surgical History:  Procedure Laterality Date  . CARDIAC CATHETERIZATION  02/28/2018  . LEFT HEART CATH AND CORONARY ANGIOGRAPHY N/A 02/28/2018   Procedure: LEFT HEART CATH AND CORONARY ANGIOGRAPHY;  Surgeon: Sherren Mocha, MD;  Location: [place] [facility];  Service: Cardiovascular;  Laterality: N/A;  . ROBOT ASSISTED LAPAROSCOPIC RADICAL PROSTATECTOMY N/A 11/19/2019   Procedure: XI ROBOTIC ASSISTED LAPAROSCOPIC RADICAL PROSTATECTOMY WITH LYMPH NODE DISSECTION;  Surgeon: Hollice Espy, MD;  Location: [facility] ORS;  Service: Urology;  Laterality: N/A;  . WRIST GANGLION EXCISION Left 1984    Home Medications:  Allergies as of 07/17/2020      Reactions   Tape Rash   Paper tape-blisters      Medication List       Accurate as of July 17, 2020  8:24 AM. If you have any questions, ask your nurse or doctor.        albuterol 108 (90 Base) MCG/ACT inhaler Commonly known as: VENTOLIN HFA Inhale 2 puffs into the lungs every 6 (six) hours as needed for wheezing or shortness of breath.   Alcohol Prep Pads 1 each by Does not apply  route 2 (two) times daily.   amLODipine 5 MG tablet Commonly known as: NORVASC TAKE 1 TABLET (5 MG TOTAL) BY MOUTH DAILY What changed:   how much to take  how to take this  when to take this  additional instructions   aspirin EC 81 MG tablet Take 81 mg by mouth daily.   atorvastatin 10 MG tablet Commonly known as: LIPITOR TAKE 1 TABLET BY MOUTH DAILY.   buPROPion 150 MG 24 hr tablet Commonly known as: Wellbutrin XL Take 1 tablet (150 mg total) by mouth daily.   butalbital-acetaminophen-caffeine 50-325-40 MG tablet Commonly known as: FIORICET TAKE 1 TO 2 TABLETS BY MOUTH EVERY 6 HOURS AS NEEDED FOR HEADACHE.   citalopram 40 MG tablet Commonly known  as: CELEXA TAKE 1 TABLET (40 MG TOTAL) BY MOUTH DAILY.   cyclobenzaprine 10 MG tablet Commonly known as: FLEXERIL Take 1 tablet (10 mg total) by mouth 3 (three) times daily as needed for muscle spasms.   FREESTYLE LITE test strip Generic drug: glucose blood 1 each by Other route in the morning and at bedtime. Use as instructed   hydroxypropyl methylcellulose / hypromellose 2.5 % ophthalmic solution Commonly known as: ISOPTO TEARS / GONIOVISC Place 1 drop into both eyes as needed for dry eyes.   hydrOXYzine 25 MG tablet Commonly known as: ATARAX/VISTARIL TAKE 1 TABLET (25 MG TOTAL) BY MOUTH AT BEDTIME AS NEEDED.   Lancets Misc 1 each by Does not apply route in the morning and at bedtime.   metFORMIN 1000 MG tablet Commonly known as: GLUCOPHAGE Take 1 tablet (1,000 mg total) by mouth 2 (two) times daily with a meal.   multivitamin with minerals tablet Take 1 tablet by mouth daily.   oxyCODONE-acetaminophen 5-325 MG tablet Commonly known as: PERCOCET/ROXICET Take 1-2 tablets by mouth every 6 (six) hours as needed for moderate pain.   sildenafil 20 MG tablet Commonly known as: Revatio Take 1 tablet (20 mg total) by mouth as needed. Take 1-5 tabs as needed prior to intercourse       Allergies:  Allergies  Allergen Reactions  . Tape Rash    Paper tape-blisters    Family History: Family History  Problem Relation Age of Onset  . Diabetes Mother   . Hyperlipidemia Mother   . Bladder Cancer Father   . Liver cancer Sister   . Diabetes Sister   . Diabetes Maternal Aunt   . Arthritis Maternal Grandmother   . Diabetes Maternal Grandmother   . Arthritis Maternal Grandfather   . Arthritis Paternal Grandmother   . Diabetes Brother   . Heart disease Brother   . Colon cancer Neg Hx     Social History:  reports that he quit smoking about 10 years ago. His smoking use included cigarettes. He has a 27.00 pack-year smoking history. He quit smokeless tobacco use about 25  years ago.  His smokeless tobacco use included chew. He reports current alcohol use of about 2.0 standard drinks of alcohol per week. He reports that he does not use drugs.   Physical Exam: BP (!) 148/97   Pulse 86   Ht 5\' 11"  (1.803 m)   Wt 246 lb (111.6 kg)   BMI 34.31 kg/m   Constitutional:  Alert and oriented, No acute distress. HEENT: [place] AT, moist mucus membranes.  Trachea midline, no masses. Cardiovascular: No clubbing, cyanosis, or edema. Respiratory: Normal respiratory effort, no increased work of breathing. Skin: No rashes, bruises or suspicious lesions. Neurologic: Grossly intact, no focal  deficits, moving all 4 extremities. Psychiatric: Normal mood and affect.  Laboratory Data: Lab Results  Component Value Date   WBC 4.0 02/05/2020   HGB 14.0 02/05/2020   HCT 42.7 02/05/2020   MCV 81.6 02/05/2020   PLT 263.0 02/05/2020    Lab Results  Component Value Date   CREATININE 0.95 02/05/2020    Lab Results  Component Value Date   PSA 0.00 Repeated and verified X2. (L) 02/05/2020   PSA 5.47 (H) 08/01/2019   PSA 3.40 11/10/2017     Lab Results  Component Value Date   HGBA1C 7.2 (H) 02/05/2020     Assessment & Plan:    1. Prostate cancer ([place]) No evidence of disease, PSA remains undetectable Given unfavorable intermediate risk disease and possible positive margin, will continue to check his PSA on a every 6 month basis Lab only in 6 months and MD visit with PSA in 1 year  2. Stress incontinence of urine Doing extremely well, continue pelvic exercises Anticipate continued improvement  3. 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

## 2020-07-28 ENCOUNTER — Encounter: Payer: Self-pay | Admitting: Internal Medicine

## 2020-07-29 ENCOUNTER — Other Ambulatory Visit: Payer: Self-pay | Admitting: Internal Medicine

## 2020-07-29 MED FILL — hydrOXYzine HCL 25 MG TABS: 25 | 30 days supply | Qty: 30 | Fill #1

## 2020-07-29 MED FILL — SILDENAFIL CITRATE 20 MG TA: 20 | 30 days supply | Qty: 30 | Fill #5

## 2020-07-29 MED FILL — ATORVASTATIN CALCIUM 10 MG: 10 | 90 days supply | Qty: 90 | Fill #1

## 2020-07-30 ENCOUNTER — Other Ambulatory Visit: Payer: Self-pay | Admitting: Internal Medicine

## 2020-07-30 MED FILL — buPROPion HCL ER (XL) 150 M: 150 | 90 days supply | Qty: 90 | Fill #0

## 2020-07-30 MED FILL — BUTALB-ACETAMIN-CAFF 50-325: 50-325-40 | 3 days supply | Qty: 20 | Fill #0

## 2020-07-30 NOTE — Telephone Encounter (Signed)
Fioricet last filled 04/30/2020...Marland Kitchen please advise

## 2020-08-06 ENCOUNTER — Encounter: Payer: Self-pay | Admitting: Internal Medicine

## 2020-08-06 ENCOUNTER — Ambulatory Visit (INDEPENDENT_AMBULATORY_CARE_PROVIDER_SITE_OTHER): Payer: No Typology Code available for payment source | Admitting: Internal Medicine

## 2020-08-06 ENCOUNTER — Other Ambulatory Visit: Payer: Self-pay | Admitting: Internal Medicine

## 2020-08-06 VITALS — BP 132/78 | HR 95 | Temp 98.0°F | Wt 243.0 lb

## 2020-08-06 DIAGNOSIS — R221 Localized swelling, mass and lump, neck: Secondary | ICD-10-CM

## 2020-08-06 DIAGNOSIS — M25552 Pain in left hip: Secondary | ICD-10-CM

## 2020-08-06 MED ORDER — PREDNISONE 10 MG PO TABS: ORAL_TABLET | ORAL | 0 refills | Status: DC

## 2020-08-06 MED FILL — predniSONE 10 MG TABS: 10 | 6 days supply | Qty: 21 | Fill #0

## 2020-08-06 NOTE — Progress Notes (Signed)
Subjective:    Patient ID: Marc Aguilar, male    DOB: [birth year], 59 y.o.   MRN: [id number]  HPI  Pt presents to the clinic today with c/o a mass to his right neck. He noticed this 10 days ago. He reports the mass was larger and harder but has gotten smaller and softer over time. When he first noticed the mass, he reports right sided neck pain and headache which has since resolved. He denies runny nose, nasal congestion, ear pain, loss of taste/smell or cough. He has taken Ibuprofen OTC as needed with some relief.   He also reports left hip pain. This started 2-3 months ago. It is intermittent. He describes the pain as sharp. The pain radiates down his left thigh. The pain is worse with walking. He denies low back pain, loss of bowel or bladder control, numbness, tingling or weakness. He denies any injury to the area. He has tried Ibuprofen OTC with some relief. He reports he has seen UC in the past, treated with muscle relaxers with minimal improvement in symptoms.     Review of Systems  Past Medical History:  Diagnosis Date  . Anxiety   . Arthritis    "knees and hips" (02/28/2018)  . Cancer [facility])    prostate   . Chicken pox   . Depression   . GERD (gastroesophageal reflux disease)   . Heart murmur   . Hernia, abdominal   . High cholesterol   . History of gout   . Migraine    "probably monthly" (02/28/2018)  . OSA on CPAP   . Pneumonia    "once" (02/28/2018)  . Refusal of blood transfusions as patient is Jehovah's Witness   . Seasonal allergies   . Type II diabetes mellitus ([place])     Current Outpatient Medications  Medication Sig Dispense Refill  . albuterol (VENTOLIN HFA) 108 (90 Base) MCG/ACT inhaler Inhale 2 puffs into the lungs every 6 (six) hours as needed for wheezing or shortness of breath. 1 Inhaler 0  . Alcohol Swabs (ALCOHOL PREP) PADS 1 each by Does not apply route 2 (two) times daily. 200 each 3  . amLODipine (NORVASC) 5 MG tablet TAKE 1 TABLET (5 MG TOTAL) BY  MOUTH DAILY (Patient taking differently: Take 5 mg by mouth daily. ) 90 tablet 3  . aspirin EC 81 MG tablet Take 81 mg by mouth daily.    Marland Kitchen atorvastatin (LIPITOR) 10 MG tablet TAKE 1 TABLET BY MOUTH DAILY. 90 tablet 1  . buPROPion (WELLBUTRIN XL) 150 MG 24 hr tablet TAKE 1 TABLET (150 MG TOTAL) BY MOUTH DAILY. 90 tablet 1  . butalbital-acetaminophen-caffeine (FIORICET) 50-325-40 MG tablet TAKE 1 TO 2 TABLETS BY MOUTH EVERY 6 HOURS AS NEEDED FOR HEADACHE. 20 tablet 0  . citalopram (CELEXA) 40 MG tablet TAKE 1 TABLET (40 MG TOTAL) BY MOUTH DAILY. 90 tablet 1  . cyclobenzaprine (FLEXERIL) 10 MG tablet Take 1 tablet (10 mg total) by mouth 3 (three) times daily as needed for muscle spasms. 30 tablet 0  . glucose blood (FREESTYLE LITE) test strip 1 each by Other route in the morning and at bedtime. Use as instructed 200 each 2  . hydroxypropyl methylcellulose / hypromellose (ISOPTO TEARS / GONIOVISC) 2.5 % ophthalmic solution Place 1 drop into both eyes as needed for dry eyes.    . hydrOXYzine (ATARAX/VISTARIL) 25 MG tablet TAKE 1 TABLET (25 MG TOTAL) BY MOUTH AT BEDTIME AS NEEDED. 30 tablet 4  . Lancets  MISC 1 each by Does not apply route in the morning and at bedtime. 200 each 2  . metFORMIN (GLUCOPHAGE) 1000 MG tablet Take 1 tablet (1,000 mg total) by mouth 2 (two) times daily with a meal. 180 tablet 1  . Multiple Vitamins-Minerals (MULTIVITAMIN WITH MINERALS) tablet Take 1 tablet by mouth daily.    . sildenafil (REVATIO) 20 MG tablet Take 1 tablet (20 mg total) by mouth as needed. Take 1-5 tabs as needed prior to intercourse 30 tablet 11  . predniSONE (DELTASONE) 10 MG tablet Take 6 tabs day 1, 5 tabs day 2, 4 tabs day 3, 3 tabs day 4, 2 tabs day 5, 1 tab day 6 21 tablet 0   No current facility-administered medications for this visit.    Allergies  Allergen Reactions  . Tape Rash    Paper tape-blisters    Family History  Problem Relation Age of Onset  . Diabetes Mother   . Hyperlipidemia  Mother   . Bladder Cancer Father   . Liver cancer Sister   . Diabetes Sister   . Diabetes Maternal Aunt   . Arthritis Maternal Grandmother   . Diabetes Maternal Grandmother   . Arthritis Maternal Grandfather   . Arthritis Paternal Grandmother   . Diabetes Brother   . Heart disease Brother   . Colon cancer Neg Hx     Social History   Socioeconomic History  . Marital status: Married    Spouse name: Not on file  . Number of children: 2  . Years of education: Not on file  . Highest education level: Not on file  Occupational History  . 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 Smoking status: Former Smoker    Packs/day: 1.00    Years: 27.00    Pack years: 27.00    Types: Cigarettes    Quit date: 10/18/2009    Years since quitting: 10.8  . Smokeless tobacco: Former Systems developer    Types: [place]ena date: 10/18/1994  Vaping Use  . Vaping Use: Never used  Substance and Sexual Activity  . Alcohol use: Yes    Alcohol/week: 2.0 standard drinks    Types: 2 Cans of beer per week  . Drug use: No  . Sexual activity: Not Currently  Other Topics Concern  . Not on file  Social History Narrative  . Not on file   Social Determinants of Health   Financial Resource Strain:   . Difficulty of Paying Living Expenses: Not on file  Food Insecurity:   . Worried About Charity fundraiser in the Last Year: Not on file  . Ran Out of Food in the Last Year: Not on file  Transportation Needs:   . Lack of Transportation (Medical): Not on file  . Lack of Transportation (Non-Medical): Not on file  Physical Activity:   . Days of Exercise per Week: Not on file  . Minutes of Exercise per Session: Not on file  Stress:   . Feeling of Stress : Not on file  Social Connections:   . Frequency of Communication with Friends and Family: Not on file  . Frequency of Social Gatherings with Friends and Family: Not on file  . Attends Religious Services: Not on file  . Active Member of Clubs or Organizations: Not on  file  . Attends Archivist Meetings: Not on file  . Marital Status: Not on file  Intimate Partner Violence:   . Fear of Current or  Ex-Partner: Not on file  . Emotionally Abused: Not on file  . Physically Abused: Not on file  . Sexually Abused: Not on file     Constitutional: Denies fever, malaise, fatigue, headache or abrupt weight changes.  HEENT: Denies eye pain, eye redness, ear pain, ringing in the ears, wax buildup, runny nose, nasal congestion, bloody nose, or sore throat. Respiratory: Denies difficulty breathing, shortness of breath, cough or sputum production.   Cardiovascular: Denies chest pain, chest tightness, palpitations or swelling in the hands or feet.  Gastrointestinal: Denies bloating, constipation, diarrhea or blood in the stool.  GU: Denies urgency, frequency, pain with urination, burning sensation, blood in urine, odor or discharge. Musculoskeletal: Pt reports left hip pain. Denies decrease in range of motion, difficulty with gait, muscle pain or joint swelling.  Skin: Pt reports mass of right side of neck. Denies redness, rashes, lesions or ulcercations.  Neurological: Denies numbness, tingling, weakness or problems with balance and coordination.    No other specific complaints in a complete review of systems (except as listed in HPI above).     Objective:   Physical Exam  BP 132/78   Pulse 95   Temp 98 F (36.7 C) (Temporal)   Wt 243 lb (110.2 kg)   SpO2 96%   BMI 33.89 kg/m  Wt Readings from Last 3 Encounters:  08/06/20 243 lb (110.2 kg)  07/17/20 246 lb (111.6 kg)  02/05/20 238 lb (108 kg)    General: Appears his stated age, well developed, well nourished in NAD. Skin: Warm, dry and intact. No rashes noted. HEENT: Head: normal shape and size; Eyes: sclera white, no icterus, conjunctiva pink, PERRL;  Neck:  0.5 cm soft, smooth mobil mass noted of the right inferior neck.  Cardiovascular: Normal rate. Pulmonary/Chest: Normal  effort. Musculoskeletal: Normal flexion, extension, rotation and lateral bending of the spine. No bony tenderness noted over the spine or paralumbar muscles. Normal abduction, adduction and external rotation of the left hip. Pain with internal rotation of the left hip. Pain with palpation over the trochanteric bursa. Strength 5/5 BLE. No difficulty with gait.  Neurological: Alert and oriented. Negative SLR on the left.   BMET    Component Value Date/Time   NA 138 02/05/2020 1058   K 4.3 02/05/2020 1058   CL 102 02/05/2020 1058   CO2 30 02/05/2020 1058   GLUCOSE 134 (H) 02/05/2020 1058   BUN 12 02/05/2020 1058   CREATININE 0.95 02/05/2020 1058   CALCIUM 9.3 02/05/2020 1058   GFRNONAA >60 11/20/2019 0445   GFRAA >60 11/20/2019 0445    Lipid Panel     Component Value Date/Time   CHOL 157 02/05/2020 1058   CHOL 157 10/26/2019 1203   TRIG 74.0 02/05/2020 1058   HDL 47.10 02/05/2020 1058   HDL 48 10/26/2019 1203   CHOLHDL 3 02/05/2020 1058   VLDL 14.8 02/05/2020 1058   LDLCALC 95 02/05/2020 1058   LDLCALC 90 10/26/2019 1203    CBC    Component Value Date/Time   WBC 4.0 02/05/2020 1058   RBC 5.23 02/05/2020 1058   HGB 14.0 02/05/2020 1058   HCT 42.7 02/05/2020 1058   PLT 263.0 02/05/2020 1058   MCV 81.6 02/05/2020 1058   MCH 25.8 (L) 11/16/2019 1154   MCHC 32.9 02/05/2020 1058   RDW 15.4 02/05/2020 1058   LYMPHSABS 3.0 02/28/2018 0300   MONOABS 0.4 02/28/2018 0300   EOSABS 0.2 02/28/2018 0300   BASOSABS 0.0 02/28/2018 0300    Hgb  A1C Lab Results  Component Value Date   HGBA1C 7.2 (H) 02/05/2020            Assessment & Plan:   Mass of Neck:  Seems like a benign lymph node Without symptoms and clinically improving, we decided to monitor symptoms for now If deteriorates, would consider CBC, BMET and US soft tissue head/neck for further evaluation.  Left Hip Pain:  DDX include trochanteric bursitis, OA of left hip No improvement with NSAID RX for Pred  Taper x 6 days- monitor sugars If worsens, would recommend xray left hip  Return precautions discussed Webb Silversmith, NP This visit occurred during the SARS-CoV-2 public health emergency.  Safety protocols were in place, including screening questions prior to the visit, additional usage of staff PPE, and extensive cleaning of exam room while observing appropriate contact time as indicated for disinfecting solutions.

## 2020-08-06 NOTE — Patient Instructions (Signed)
Hip Bursitis  Hip bursitis is swelling of a fluid-filled sac (bursa) in your hip joint. This swelling (inflammation) can be painful. This condition may come and go over time. What are the causes?  Injury to the hip.  Overuse of the muscles that surround the hip joint.  An earlier injury or surgery of the hip.  Arthritis or gout.  Diabetes.  Thyroid disease.  Infection.  In some cases, the cause may not be known. What are the signs or symptoms?  Mild or moderate pain in the hip area. Pain may get worse with movement.  Tenderness and swelling of the hip, especially on the outer side of the hip.  In rare cases, the bursa may become infected. This may cause: ? A fever. ? Warmth and redness in the area. Symptoms may come and go. How is this treated? This condition is treated by resting, icing, applying pressure (compression), and raising (elevating) the injured area. You may hear this called the RICE treatment. Treatment may also include:  Using crutches.  Draining fluid out of the bursa to help relieve swelling.  Giving a shot of (injecting) medicine that helps to reduce swelling (cortisone).  Other medicines if the bursa is infected. Follow these instructions at home: Managing pain, stiffness, and swelling   If told, put ice on the painful area. ? Put ice in a plastic bag. ? Place a towel between your skin and the bag. ? Leave the ice on for 20 minutes, 2-3 times a day. ? Raise (elevate) your hip above the level of your heart as much as you can without pain. To do this, try putting a pillow under your hips while you lie down. Stop if this causes pain. Activity  Return to your normal activities as told by your doctor. Ask your doctor what activities are safe for you.  Rest and protect your hip as much as you can until you feel better. General instructions  Take over-the-counter and prescription medicines only as told by your doctor.  Wear wraps that put pressure  on your hip (compression wraps) only as told by your doctor.  Do not use your hip to support your body weight until your doctor says that you can.  Use crutches as told by your doctor.  Gently rub and stretch your injured area as often as is comfortable.  Keep all follow-up visits as told by your doctor. This is important. How is this prevented?  Exercise regularly, as told by your doctor.  Warm up and stretch before being active.  Cool down and stretch after being active.  Avoid activities that bother your hip or cause pain.  Avoid sitting down for long periods at a time. Contact a doctor if:  You have a fever.  You get new symptoms.  You have trouble walking.  You have trouble doing everyday activities.  You have pain that gets worse.  You have pain that does not get better with medicine.  You get red skin on your hip area.  You get a feeling of warmth in your hip area. Get help right away if:  You cannot move your hip.  You have very bad pain. Summary  Hip bursitis is swelling of a fluid-filled sac (bursa) in your hip.  Hip bursitis can be painful.  Symptoms often come and go over time.  This condition is treated with rest, ice, compression, elevation, and medicines. This information is not intended to replace advice given to you by your health care provider.   Make sure you discuss any questions you have with your health care provider. Document Revised: 06/12/2018 Document Reviewed: 06/12/2018 Elsevier Patient Education  2020 [employer]  

## 2020-08-19 ENCOUNTER — Encounter: Payer: Self-pay | Admitting: Internal Medicine

## 2020-09-26 ENCOUNTER — Encounter (HOSPITAL_COMMUNITY): Payer: Self-pay

## 2020-09-26 ENCOUNTER — Emergency Department (HOSPITAL_COMMUNITY): Payer: No Typology Code available for payment source

## 2020-09-26 ENCOUNTER — Other Ambulatory Visit: Payer: Self-pay

## 2020-09-26 ENCOUNTER — Emergency Department (HOSPITAL_COMMUNITY)
Admission: EM | Admit: 2020-09-26 | Discharge: 2020-09-27 | Disposition: A | Discharge status: Home / Self Care | Payer: No Typology Code available for payment source | Attending: Emergency Medicine | Admitting: Emergency Medicine

## 2020-09-26 ENCOUNTER — Other Ambulatory Visit (HOSPITAL_COMMUNITY): Payer: Self-pay | Admitting: Emergency Medicine

## 2020-09-26 DIAGNOSIS — Z955 Presence of coronary angioplasty implant and graft: Secondary | ICD-10-CM | POA: Insufficient documentation

## 2020-09-26 DIAGNOSIS — Z87891 Personal history of nicotine dependence: Secondary | ICD-10-CM | POA: Diagnosis not present

## 2020-09-26 DIAGNOSIS — Z79899 Other long term (current) drug therapy: Secondary | ICD-10-CM | POA: Diagnosis not present

## 2020-09-26 DIAGNOSIS — Z8546 Personal history of malignant neoplasm of prostate: Secondary | ICD-10-CM | POA: Diagnosis not present

## 2020-09-26 DIAGNOSIS — Z20822 Contact with and (suspected) exposure to covid-19: Secondary | ICD-10-CM | POA: Insufficient documentation

## 2020-09-26 DIAGNOSIS — R000 Tachycardia, unspecified: Secondary | ICD-10-CM | POA: Diagnosis not present

## 2020-09-26 DIAGNOSIS — I10 Essential (primary) hypertension: Secondary | ICD-10-CM | POA: Insufficient documentation

## 2020-09-26 DIAGNOSIS — R0789 Other chest pain: Secondary | ICD-10-CM | POA: Diagnosis not present

## 2020-09-26 DIAGNOSIS — Z7984 Long term (current) use of oral hypoglycemic drugs: Secondary | ICD-10-CM | POA: Insufficient documentation

## 2020-09-26 DIAGNOSIS — Z7982 Long term (current) use of aspirin: Secondary | ICD-10-CM | POA: Insufficient documentation

## 2020-09-26 DIAGNOSIS — E119 Type 2 diabetes mellitus without complications: Secondary | ICD-10-CM | POA: Diagnosis not present

## 2020-09-26 DIAGNOSIS — K859 Acute pancreatitis without necrosis or infection, unspecified: Secondary | ICD-10-CM

## 2020-09-26 LAB — COMPREHENSIVE METABOLIC PANEL
ALT: 27 U/L (ref 0–44)
AST: 35 U/L (ref 15–41)
Albumin: 4.2 g/dL (ref 3.5–5.0)
Alkaline Phosphatase: 75 U/L (ref 38–126)
Anion gap: 14 (ref 5–15)
BUN: 18 mg/dL (ref 6–20)
CO2: 21 mmol/L — ABNORMAL LOW (ref 22–32)
Calcium: 9.4 mg/dL (ref 8.9–10.3)
Chloride: 102 mmol/L (ref 98–111)
Creatinine, Ser: 1.06 mg/dL (ref 0.61–1.24)
GFR, Estimated: >60 mL/min (ref >60)
Glucose, Bld: 133 mg/dL — ABNORMAL HIGH (ref 70–99)
Potassium: 4.4 mmol/L (ref 3.5–5.1)
Sodium: 137 mmol/L (ref 135–145)
Total Bilirubin: 0.9 mg/dL (ref 0.3–1.2)
Total Protein: 7 g/dL (ref 6.5–8.1)

## 2020-09-26 LAB — CBC WITH DIFFERENTIAL/PLATELET
Abs Immature Granulocytes: 0.01 10*3/uL (ref 0.00–0.07)
Basophils Absolute: 0.1 10*3/uL (ref 0.0–0.1)
Basophils Relative: 1 %
Eosinophils Absolute: 0.2 10*3/uL (ref 0.0–0.5)
Eosinophils Relative: 3 %
HCT: 46.5 % (ref 39.0–52.0)
Hemoglobin: 14.9 g/dL (ref 13.0–17.0)
Immature Granulocytes: 0 %
Lymphocytes Relative: 45 %
Lymphs Abs: 3 10*3/uL (ref 0.7–4.0)
MCH: 26 pg (ref 26.0–34.0)
MCHC: 32 g/dL (ref 30.0–36.0)
MCV: 81.2 fL (ref 80.0–100.0)
Monocytes Absolute: 0.6 10*3/uL (ref 0.1–1.0)
Monocytes Relative: 9 %
Neutro Abs: 2.8 10*3/uL (ref 1.7–7.7)
Neutrophils Relative %: 42 %
Platelets: 235 10*3/uL (ref 150–400)
RBC: 5.73 MIL/uL (ref 4.22–5.81)
RDW: 14.3 % (ref 11.5–15.5)
WBC: 6.7 10*3/uL (ref 4.0–10.5)
nRBC: 0 % (ref 0.0–0.2)

## 2020-09-26 LAB — PROTIME-INR
INR: 1 (ref 0.8–1.2)
Prothrombin Time: 12.4 seconds (ref 11.4–15.2)

## 2020-09-26 LAB — TROPONIN I (HIGH SENSITIVITY)
Troponin I (High Sensitivity): 6 ng/L (ref <18)
Troponin I (High Sensitivity): 6 ng/L (ref <18)

## 2020-09-26 LAB — LIPASE, BLOOD: Lipase: 125 U/L — ABNORMAL HIGH (ref 11–51)

## 2020-09-26 LAB — RESP PANEL BY RT-PCR (FLU A&B, COVID) ARPGX2
Influenza A by PCR: NEGATIVE
Influenza B by PCR: NEGATIVE
SARS Coronavirus 2 by RT PCR: NEGATIVE

## 2020-09-26 LAB — D-DIMER, QUANTITATIVE: D-Dimer, Quant: 0.49 ug/mL-FEU (ref 0.00–0.50)

## 2020-09-26 MED ORDER — LIDOCAINE VISCOUS HCL 2 % MT SOLN
15.0000 mL | Freq: Once | OROMUCOSAL | Status: AC
  Administered 2020-09-26: 15 mL via ORAL
  Filled 2020-09-26: qty 15

## 2020-09-26 MED ORDER — ONDANSETRON HCL 4 MG PO TABS: 4.0000 mg | ORAL_TABLET | Freq: Three times a day (TID) | ORAL | 0 refills | Status: DC | PRN

## 2020-09-26 MED ORDER — ACETAMINOPHEN 500 MG PO TABS
1000.0000 mg | ORAL_TABLET | Freq: Once | ORAL | Status: AC
  Administered 2020-09-26: 1000 mg via ORAL
  Filled 2020-09-26: qty 2

## 2020-09-26 MED ORDER — ONDANSETRON HCL 4 MG/2ML IJ SOLN
4.0000 mg | Freq: Once | INTRAMUSCULAR | Status: AC
  Administered 2020-09-26: 4 mg via INTRAVENOUS
  Filled 2020-09-26: qty 2

## 2020-09-26 MED ORDER — ALUM & MAG HYDROXIDE-SIMETH 200-200-20 MG/5ML PO SUSP
30.0000 mL | Freq: Once | ORAL | Status: AC
  Administered 2020-09-26: 30 mL via ORAL
  Filled 2020-09-26: qty 30

## 2020-09-26 NOTE — Discharge Instructions (Signed)
Thank you for allowing [place] to take care of you today.  Over the next several days, make sure you are well-hydrated.  Start with clear liquids and then slowly advance your diet to incorporate other liquids, softer foods, bland solid food, and then your regular diet as you are able to tolerate it.  You can take Tylenol (1000 mg every 6 hours as needed with a max 4000 mg/day) for pain as needed and Zofran (prescription provided) for nausea.  If your pain gets a lot worse, you have fever, worsening chest pain, worsening shortness of breath, or other concerning symptoms, come back to the ED and/or call 911.  Follow-up with your primary care doctor as soon as possible.

## 2020-09-26 NOTE — ED Provider Notes (Signed)
[name] Provider Note   CSN: [id number] Arrival date & time: 09/26/20  0388     History Chief Complaint  Patient presents with  . Chest Pain    Marc Aguilar is a 59 y.o. male with a PMH of anxiety, depression, sleep apnea, diabetes, hypertension, and hyperlipidemia presents with chest pain and tachycardia.  Per patient, he had a brief episode of chest pain yesterday and then today around noon (about 8 hours ago) he had right-sided chest pain.  He also notes that his heart rate has fluctuated from the 40s to the 130s on his home pulse ox which is concerning to him.  He states that his heart rate increases when he is up walking around.  His chest pain does not have any clear aggravating or alleviating factors; not exertional.  Is associated with diaphoresis, shortness of breath, and nausea.  No previous similar symptoms.  Does not smoke.  No unilateral leg swelling, no recent surgeries.  The history is provided by the patient.  Chest Pain Pain location:  R chest and substernal area Pain quality: pressure   Pain radiates to:  Neck and L jaw Pain severity:  Moderate Onset quality:  Gradual Duration:  8 hours Timing:  Constant Progression:  Waxing and waning Chronicity:  New Relieved by:  Nothing Worsened by:  Nothing Associated symptoms: diaphoresis, nausea and shortness of breath   Associated symptoms: no abdominal pain, no back pain, no cough, no fever, no headache (not at this time, did have headache 1 week ago), no palpitations and no vomiting   Associated symptoms comment:  Tachycardia on home pulse ox      Past Medical History:  Diagnosis Date  . Anxiety   . Arthritis    "knees and hips" (02/28/2018)  . Cancer [facility])    prostate   . Chicken pox   . Depression   . GERD (gastroesophageal reflux disease)   . Heart murmur   . Hernia, abdominal   . High cholesterol   . History of gout   . Migraine    "probably monthly"  (02/28/2018)  . OSA on CPAP   . Pneumonia    "once" (02/28/2018)  . Refusal of blood transfusions as patient is Jehovah's Witness   . Seasonal allergies   . Type II diabetes mellitus [facility])     Patient Active Problem List   Diagnosis Date Noted  . Prostate cancer ([place]) 11/19/2019  . Osteoarthritis 11/14/2018  . Migraines 11/14/2018  . Anxiety and depression 11/14/2018  . HTN (hypertension) 11/14/2018  . Benign prostatic hyperplasia with urinary frequency 10/26/2016  . HLD (hyperlipidemia) 10/02/2015  . OSA (obstructive sleep apnea) 03/13/2015  . Seasonal allergies 03/13/2015  . GERD (gastroesophageal reflux disease) 03/13/2015  . Diabetes mellitus ([place]) 10/30/2013    Past Surgical History:  Procedure Laterality Date  . CARDIAC CATHETERIZATION  02/28/2018  . LEFT HEART CATH AND CORONARY ANGIOGRAPHY N/A 02/28/2018   Procedure: LEFT HEART CATH AND CORONARY ANGIOGRAPHY;  Surgeon: Sherren Mocha, MD;  Location: [place] [facility];  Service: Cardiovascular;  Laterality: N/A;  . ROBOT ASSISTED LAPAROSCOPIC RADICAL PROSTATECTOMY N/A 11/19/2019   Procedure: XI ROBOTIC ASSISTED LAPAROSCOPIC RADICAL PROSTATECTOMY WITH LYMPH NODE DISSECTION;  Surgeon: Hollice Espy, MD;  Location: [facility] ORS;  Service: Urology;  Laterality: N/A;  . WRIST GANGLION EXCISION Left 1984       Family History  Problem Relation Age of Onset  . Diabetes Mother   . Hyperlipidemia Mother   .  Bladder Cancer Father   . Liver cancer Sister   . Diabetes Sister   . Diabetes Maternal Aunt   . Arthritis Maternal Grandmother   . Diabetes Maternal Grandmother   . Arthritis Maternal Grandfather   . Arthritis Paternal Grandmother   . Diabetes Brother   . Heart disease Brother   . Colon cancer Neg Hx     Social History   Tobacco Use  . Smoking status: Former Smoker    Packs/day: 1.00    Years: 27.00    Pack years: 27.00    Types: Cigarettes    Quit date: 10/18/2009    Years since quitting: 10.9  . Smokeless  tobacco: Former Systems developer    Types: [place] date: 10/18/1994  Vaping Use  . Vaping Use: Never used  Substance Use Topics  . Alcohol use: Yes    Alcohol/week: 2.0 standard drinks    Types: 2 Cans of beer per week  . Drug use: No    Home Medications Prior to Admission medications   Medication Sig Start Date End Date Taking? Authorizing Provider  albuterol (VENTOLIN HFA) 108 (90 Base) MCG/ACT inhaler Inhale 2 puffs into the lungs every 6 (six) hours as needed for wheezing or shortness of breath. 03/26/19   [name], NP  Alcohol Swabs (ALCOHOL PREP) PADS 1 each by Does not apply route 2 (two) times daily. 02/01/17   [name], NP  amLODipine (NORVASC) 5 MG tablet TAKE 1 TABLET (5 MG TOTAL) BY MOUTH DAILY Patient taking differently: Take 5 mg by mouth daily.  10/26/19   Almyra Deforest, PA  aspirin EC 81 MG tablet Take 81 mg by mouth daily.    [provider]  atorvastatin (LIPITOR) 10 MG tablet TAKE 1 TABLET BY MOUTH DAILY. 02/26/20   [name], NP  buPROPion (WELLBUTRIN XL) 150 MG 24 hr tablet TAKE 1 TABLET (150 MG TOTAL) BY MOUTH DAILY. 07/30/20   [name], NP  butalbital-acetaminophen-caffeine (FIORICET) 50-325-40 MG tablet TAKE 1 TO 2 TABLETS BY MOUTH EVERY 6 HOURS AS NEEDED FOR HEADACHE. 07/30/20   [name], NP  citalopram (CELEXA) 40 MG tablet TAKE 1 TABLET (40 MG TOTAL) BY MOUTH DAILY. 04/30/20   [name], NP  cyclobenzaprine (FLEXERIL) 10 MG tablet Take 1 tablet (10 mg total) by mouth 3 (three) times daily as needed for muscle spasms. 05/19/20   Couture, Cortni S, PA-C  glucose blood (FREESTYLE LITE) test strip 1 each by Other route in the morning and at bedtime. Use as instructed 02/06/20   [name], NP  hydroxypropyl methylcellulose / hypromellose (ISOPTO TEARS / GONIOVISC) 2.5 % ophthalmic solution Place 1 drop into both eyes as needed for dry eyes.    [provider]  hydrOXYzine (ATARAX/VISTARIL) 25 MG tablet TAKE 1 TABLET (25 MG  TOTAL) BY MOUTH AT BEDTIME AS NEEDED. 04/30/20   [name], NP  Lancets MISC 1 each by Does not apply route in the morning and at bedtime. 02/06/20   [name], NP  metFORMIN (GLUCOPHAGE) 1000 MG tablet Take 1 tablet (1,000 mg total) by mouth 2 (two) times daily with a meal. 04/30/20   Baity, Coralie Keens, NP  Multiple Vitamins-Minerals (MULTIVITAMIN WITH MINERALS) tablet Take 1 tablet by mouth daily.    [provider]  predniSONE (DELTASONE) 10 MG tablet Take 6 tabs day 1, 5 tabs day 2, 4 tabs day 3, 3 tabs day 4, 2 tabs day  5, 1 tab day 6 08/06/20   [name], NP  sildenafil (REVATIO) 20 MG tablet Take 1 tablet (20 mg total) by mouth as needed. Take 1-5 tabs as needed prior to intercourse 12/19/19   Hollice Espy, MD    Allergies    Tape  Review of Systems   Review of Systems  Constitutional: Positive for diaphoresis. Negative for chills and fever.  HENT: Negative for ear pain and sore throat.   Eyes: Negative for pain and visual disturbance.  Respiratory: Positive for shortness of breath. Negative for cough.   Cardiovascular: Positive for chest pain. Negative for palpitations.  Gastrointestinal: Positive for diarrhea ("Loose stools") and nausea. Negative for abdominal pain, constipation and vomiting.  Genitourinary: Negative for dysuria and hematuria.  Musculoskeletal: Negative for arthralgias and back pain.  Skin: Negative for color change and rash.  Neurological: Negative for seizures, syncope and headaches (not at this time, did have headache 1 week ago).  All other systems reviewed and are negative.   Physical Exam Updated Vital Signs BP (!) 152/92 (BP Location: Left Arm)   Pulse 100   Temp 99.1 F (37.3 C) (Oral)   Resp 16   Ht 5\' 11"  (1.803 m)   Wt 106.6 kg   SpO2 97%   BMI 32.78 kg/m   Physical Exam Vitals and nursing note reviewed.  Constitutional:      General: He is not in acute distress.    Appearance: He is well-developed and  well-nourished. He is obese. He is not ill-appearing or toxic-appearing.  HENT:     Head: Normocephalic and atraumatic.  Eyes:     Extraocular Movements: Extraocular movements intact.     Conjunctiva/sclera: Conjunctivae normal.     Pupils: Pupils are equal, round, and reactive to light.  Cardiovascular:     Rate and Rhythm: Normal rate and regular rhythm.     Pulses:          Radial pulses are 2+ on the right side and 2+ on the left side.       Posterior tibial pulses are 2+ on the right side and 2+ on the left side.     Heart sounds: No murmur heard.   Pulmonary:     Effort: Pulmonary effort is normal. No respiratory distress.     Breath sounds: Normal breath sounds. No decreased breath sounds, wheezing, rhonchi or rales.  Abdominal:     Palpations: Abdomen is soft.     Tenderness: There is abdominal tenderness (mild) in the epigastric area. There is no guarding or rebound.  Musculoskeletal:        General: No edema.     Cervical back: Neck supple.     Right lower leg: No tenderness. No edema.     Left lower leg: No tenderness. No edema.  Skin:    General: Skin is warm and dry.  Neurological:     General: No focal deficit present.     Mental Status: He is alert and oriented to person, place, and time.     Cranial Nerves: No cranial nerve deficit.     Motor: No weakness.  Psychiatric:        Mood and Affect: Mood and affect and mood normal.        Behavior: Behavior normal.     ED Results / Procedures / Treatments   Labs (all labs ordered are listed, but only abnormal results are displayed) Labs Reviewed  RESP PANEL BY RT-PCR (FLU A&B, COVID) ARPGX2  COMPREHENSIVE METABOLIC PANEL  LIPASE, BLOOD  CBC WITH DIFFERENTIAL/PLATELET  PROTIME-INR  D-DIMER, QUANTITATIVE (NOT AT [facility])  TROPONIN I (HIGH SENSITIVITY)    EKG EKG Interpretation  Date/Time:  Friday September 26 2020 19:12:31 EST Ventricular Rate:  110 PR Interval:  140 QRS Duration: 80 QT  Interval:  338 QTC Calculation: 457 R Axis:   95 Text Interpretation: Sinus tachycardia Rightward axis T wave abnormality, consider inferior ischemia Abnormal ECG no sig change from previous Confirmed by Charlesetta Shanks [phone]) on 09/26/2020 7:26:36 PM   Radiology DG Chest 2 View  Result Date: 09/26/2020 CLINICAL DATA:  Chest pain EXAM: CHEST - 2 VIEW COMPARISON:  03/26/2019 FINDINGS: Heart and mediastinal contours are within normal limits. No focal opacities or effusions. No acute bony abnormality. IMPRESSION: No active cardiopulmonary disease. Electronically Signed   By: Rolm Baptise M.D.   On: 09/26/2020 19:56    Procedures Procedures (including critical care time)  Medications Ordered in ED Medications - No data to display  ED Course  I have reviewed the triage vital signs and the nursing notes.  Pertinent labs & imaging results that were available during my care of the patient were reviewed by me and considered in my medical decision making (see chart for details).    MDM Rules/Calculators/A&P                          MDM: Marc Aguilar is a 59 y.o. male who presents with chest pain as per above. I have reviewed the nursing documentation for past medical history, family history, and social history. Pertinent previous records reviewed. He is awake, alert.  Tachycardic initially but not after being roomed.  HDS. Afebrile. Physical exam is most notable for anxious but overall well-appearing 59 year old male in no acute distress.  Labs: Lipase 125.  D-dimer normal.  CBC unremarkable.  Troponin negative x2.  Covid negative.  CMP unremarkable. EKG: NSR, right axis deviation.  T wave inversions in III and aVF are new since EKG 10/26/2019. QTc, PR, and QRS within appropriate limits. No signs of acute ischemia, infarct, or significant electrical abnormalities. No STEMI no ST depressions. No evidence of a High-Grade Conduction Block, WPW, Brugada Sign, ARVC, DeWinters T Waves, or Wellens  Waves. Imaging: CXR demonstrating no acute cardiopulmonary abnormality. Consults: none Tx: MiraLAX/Viscous Lidocaine, with old milligrams p.o. Tylenol, 4 mg IV Zofran  Differential Dx: I am most concerned for reflux versus mild acute pancreatitis. Given history, physical exam, and work-up, I do not think he has necrotizing pancreatitis, sepsis, bacteremia, bowel perforation, PUD, ACS, PE, pneumonia, aortic dissection/aneurysm, cholecystitis, or trauma.  MDM: Marc Aguilar is a 59 y.o. male presents with chest pain and concern for tachycardia.  Patient borderline tachycardic on initial assessment to 100 but since then has been not tachycardic with heart rate around 80-90s.  History not consistent with cardiac etiology.  Patient has a history of reflux and hiatal hernia and states that this is somewhat similar to his previous symptoms.  Mild improvement with Tylenol and GI cocktail.  States that he feels like the viscous lidocaine helped the most suggesting possible reflux.  However, lipase 125 with mild epigastric tenderness on reassessment.  Discussed possible GERD versus mild pancreatitis.  Given patient's history and exam, do not feel that CT imaging is indicated at this time with low concern for necrotizing/infected pancreatitis.   If patient has mild pancreatitis, unclear etiology.  However, does drink about 4 drinks per day which  would be most likely etiology.Given patient's history, exam, and work-up, do not feel that admission for pancreatitis or further work-up is indicated at this time.  Discussed outpatient symptomatic management with patient as well as diet including clear liquid diet with slow advancement.  Signs/symptoms that warrant immediate return were discussed with the patient who voiced understanding.  Patient given prescription for Zofran.  Do not fill that antibiotics or narcotic pain medications are indicated at this time based on exam although these were considered.  Strict return  precautions provided. Encouraged him to follow-up with his PCP on an outpatient basis. Questions were answered.  Patient discharged in stable condition.  The plan for this patient was discussed with Dr. Sabra Heck, who voiced agreement and who oversaw evaluation and treatment of this patient.   Final Clinical Impression(s) / ED Diagnoses Final diagnoses:  None    Rx / DC Orders ED Discharge Orders    None       Sole Lengacher, MD 09/27/20 Layla Maw    Noemi Chapel, MD 09/29/20 [phone]

## 2020-09-26 NOTE — ED Notes (Signed)
Transported to xray 

## 2020-09-29 ENCOUNTER — Encounter: Payer: Self-pay | Admitting: Internal Medicine

## 2020-09-30 ENCOUNTER — Ambulatory Visit (INDEPENDENT_AMBULATORY_CARE_PROVIDER_SITE_OTHER): Payer: No Typology Code available for payment source | Admitting: Internal Medicine

## 2020-09-30 ENCOUNTER — Encounter: Payer: Self-pay | Admitting: Internal Medicine

## 2020-09-30 ENCOUNTER — Other Ambulatory Visit: Payer: Self-pay | Admitting: Internal Medicine

## 2020-09-30 ENCOUNTER — Other Ambulatory Visit: Payer: Self-pay

## 2020-09-30 VITALS — BP 142/90 | HR 92 | Temp 97.7°F | Wt 243.0 lb

## 2020-09-30 DIAGNOSIS — K219 Gastro-esophageal reflux disease without esophagitis: Secondary | ICD-10-CM

## 2020-09-30 DIAGNOSIS — R748 Abnormal levels of other serum enzymes: Secondary | ICD-10-CM | POA: Diagnosis not present

## 2020-09-30 DIAGNOSIS — R1012 Left upper quadrant pain: Secondary | ICD-10-CM | POA: Diagnosis not present

## 2020-09-30 DIAGNOSIS — R1013 Epigastric pain: Secondary | ICD-10-CM | POA: Diagnosis not present

## 2020-09-30 LAB — AMYLASE: Amylase: 111 U/L (ref 27–131)

## 2020-09-30 LAB — LIPASE: Lipase: 53 U/L (ref 11.0–59.0)

## 2020-09-30 MED ORDER — OMEPRAZOLE 20 MG PO CPDR: 20.0000 mg | DELAYED_RELEASE_CAPSULE | Freq: Every day | ORAL | 3 refills | Status: DC

## 2020-09-30 MED FILL — ONDANSETRON HCL 4 MG TABLET: 4 | 7 days supply | Qty: 20 | Fill #0

## 2020-09-30 MED FILL — OMEPRAZOLE DR 20 MG CAPSULE: 20 | 30 days supply | Qty: 30 | Fill #0

## 2020-09-30 NOTE — Patient Instructions (Signed)
Lipase Test Why am I having this test? The lipase test is often used to check for problems with the pancreas. Lipase is a protein (enzyme) that is released by the pancreas into the small intestine to help digest a type of fat called triglycerides. You may have this test if your health care provider suspects that you have damage to or an infection of your pancreas (pancreatitis). These problems can cause your pancreas to release more lipase. The test is often done along with other blood tests. If you have abdominal pain, your health care provider may use this test to help figure out the cause of your pain. The test can help determine if the pain is related to your pancreas or to other causes. What is being tested? This test measures the amount of lipase in your blood. What kind of sample is taken?  A blood sample is required for this test. It is usually collected by inserting a needle into a blood vessel. How do I prepare for this test?  Do not eat or drink anything except water for 8-12 hours before the test or as told by your health care provider.  Tell your health care provider about all medicines you are taking, including vitamins, herbs, eye drops, creams, and over-the-counter medicines. How are the results reported? Your test results will be reported as values that indicate the amount of lipase in your blood. Your health care provider will compare your results to normal ranges that were established after testing a large group of people (reference ranges). Reference ranges may vary among labs and hospitals. For this test, a common reference range is:  0-160 units/L or 0 to 2.67 microkatals/L in SI units. What do the results mean? Lipase levels that are higher than the reference range can be seen with many health conditions. These may include:  Disease of the pancreas.  Disease of the gallbladder.  Kidney failure.  Bowel obstruction or infarction, which means death of tissue due to a  lack of blood supply.  Swelling or infection of salivary glands.  Peptic ulcer disease. Talk with your health care provider about what your results mean. Questions to ask your health care provider Ask your health care provider, or the department that is doing the test:  When will my results be ready?  How will I get my results?  What are my treatment options?  What other tests do I need?  What are my next steps? Summary  The lipase test is often used to check for problems with the pancreas. Lipase is a protein (enzyme) that is released by the pancreas to help digest a type of fat called triglycerides.  You may have this test if your health care provider suspects that you have damage to or an infection of your pancreas (pancreatitis). Pancreatitis can cause your pancreas to release more lipase. Lipase levels that are higher than normal can indicate other health conditions, too.  Talk with your health care provider about what your results mean. This information is not intended to replace advice given to you by your health care provider. Make sure you discuss any questions you have with your health care provider. Document Revised: 05/24/2018 Document Reviewed: 05/25/2017 Elsevier Patient Education  [place].

## 2020-09-30 NOTE — Progress Notes (Signed)
Subjective:    Patient ID: Marc Aguilar, male    DOB: 06-Oct-1961, 59 y.o.   MRN: [id number]  HPI  Pt presents to the clinic today for ER follow up. He went to the ER 12/13 with c/o abdominal pain and chest pressure. ECG, chest xray were unremarkable. Labs showed elevated lipase but otherwise normal. He was diagnosed with GERD vs mild pancreatitis. He was treated with Tylenol, GI Cocktail and given a RX for Zofran, discharged and advised to follow up with his PCP. Since discharge, he reports intermittent nausea and abdominal pain. He denies chest pain or SOB. His bowels are moving regularly, no blood noted.  He has had reflux in the past but is not currently taking any medication for this. There is no upper GI on file.   He would also like a referral to pulmonology for repeat sleep study. He is having issues with his CPAP machine and it has been > 5 years since his last sleep study.  Review of Systems      Past Medical History:  Diagnosis Date  . Anxiety   . Arthritis    "knees and hips" (02/28/2018)  . Cancer [facility])    prostate   . Chicken pox   . Depression   . GERD (gastroesophageal reflux disease)   . Heart murmur   . Hernia, abdominal   . High cholesterol   . History of gout   . Migraine    "probably monthly" (02/28/2018)  . OSA on CPAP   . Pneumonia    "once" (02/28/2018)  . Refusal of blood transfusions as patient is Jehovah's Witness   . Seasonal allergies   . Type II diabetes mellitus ([place])     Current Outpatient Medications  Medication Sig Dispense Refill  . albuterol (VENTOLIN HFA) 108 (90 Base) MCG/ACT inhaler Inhale 2 puffs into the lungs every 6 (six) hours as needed for wheezing or shortness of breath. 1 Inhaler 0  . Alcohol Swabs (ALCOHOL PREP) PADS 1 each by Does not apply route 2 (two) times daily. 200 each 3  . amLODipine (NORVASC) 5 MG tablet TAKE 1 TABLET (5 MG TOTAL) BY MOUTH DAILY (Patient taking differently: Take 5 mg by mouth daily.) 90 tablet 3  .  aspirin EC 81 MG tablet Take 81 mg by mouth daily.    Marland Kitchen atorvastatin (LIPITOR) 10 MG tablet TAKE 1 TABLET BY MOUTH DAILY. (Patient taking differently: Take 10 mg by mouth at bedtime.) 90 tablet 1  . buPROPion (WELLBUTRIN XL) 150 MG 24 hr tablet TAKE 1 TABLET (150 MG TOTAL) BY MOUTH DAILY. 90 tablet 1  . butalbital-acetaminophen-caffeine (FIORICET) 50-325-40 MG tablet TAKE 1 TO 2 TABLETS BY MOUTH EVERY 6 HOURS AS NEEDED FOR HEADACHE. (Patient taking differently: Take 1-2 tablets by mouth every 6 (six) hours as needed for headache.) 20 tablet 0  . citalopram (CELEXA) 40 MG tablet TAKE 1 TABLET (40 MG TOTAL) BY MOUTH DAILY. 90 tablet 1  . cyclobenzaprine (FLEXERIL) 10 MG tablet Take 1 tablet (10 mg total) by mouth 3 (three) times daily as needed for muscle spasms. (Patient not taking: No sig reported) 30 tablet 0  . glucose blood (FREESTYLE LITE) test strip 1 each by Other route in the morning and at bedtime. Use as instructed 200 each 2  . hydroxypropyl methylcellulose / hypromellose (ISOPTO TEARS / GONIOVISC) 2.5 % ophthalmic solution Place 1 drop into both eyes as needed for dry eyes.    . hydrOXYzine (ATARAX/VISTARIL) 25 MG tablet  TAKE 1 TABLET (25 MG TOTAL) BY MOUTH AT BEDTIME AS NEEDED. (Patient taking differently: Take 25 mg by mouth at bedtime as needed (sleep).) 30 tablet 4  . Lancets MISC 1 each by Does not apply route in the morning and at bedtime. 200 each 2  . metFORMIN (GLUCOPHAGE) 1000 MG tablet Take 1 tablet (1,000 mg total) by mouth 2 (two) times daily with a meal. 180 tablet 1  . Multiple Vitamins-Minerals (MULTIVITAMIN WITH MINERALS) tablet Take 1 tablet by mouth daily.    . predniSONE (DELTASONE) 10 MG tablet Take 6 tabs day 1, 5 tabs day 2, 4 tabs day 3, 3 tabs day 4, 2 tabs day 5, 1 tab day 6 (Patient not taking: No sig reported) 21 tablet 0  . sildenafil (REVATIO) 20 MG tablet Take 1 tablet (20 mg total) by mouth as needed. Take 1-5 tabs as needed prior to intercourse 30 tablet 11    No current facility-administered medications for this visit.    Allergies  Allergen Reactions  . Tape Rash    Paper tape-blisters    Family History  Problem Relation Age of Onset  . Diabetes Mother   . Hyperlipidemia Mother   . Bladder Cancer Father   . Liver cancer Sister   . Diabetes Sister   . Diabetes Maternal Aunt   . Arthritis Maternal Grandmother   . Diabetes Maternal Grandmother   . Arthritis Maternal Grandfather   . Arthritis Paternal Grandmother   . Diabetes Brother   . Heart disease Brother   . Colon cancer Neg Hx     Social History   Socioeconomic History  . Marital status: Married    Spouse name: Not on file  . Number of children: 2  . Years of education: Not on file  . Highest education level: Not on file  Occupational History  . 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 Smoking status: Former Smoker    Packs/day: 1.00    Years: 27.00    Pack years: 27.00    Types: Cigarettes    Quit date: 10/18/2009    Years since quitting: 10.9  . Smokeless tobacco: Former Systems developer    Types: [place] date: 10/18/1994  Vaping Use  . Vaping Use: Never used  Substance and Sexual Activity  . Alcohol use: Yes    Alcohol/week: 2.0 standard drinks    Types: 2 Cans of beer per week  . Drug use: No  . Sexual activity: Not Currently  Other Topics Concern  . Not on file  Social History Narrative  . Not on file   Social Determinants of Health   Financial Resource Strain: Not on file  Food Insecurity: Not on file  Transportation Needs: Not on file  Physical Activity: Not on file  Stress: Not on file  Social Connections: Not on file  Intimate Partner Violence: Not on file     Constitutional: Denies fever, malaise, fatigue, headache or abrupt weight changes.  Respiratory: Denies difficulty breathing, shortness of breath, cough or sputum production.   Cardiovascular: Denies chest pain, chest tightness, palpitations or swelling in the hands or feet.   Gastrointestinal: Pt reports abdominal pain and nausea. Denies bloating, constipation, diarrhea or blood in the stool.  GU: Denies urgency, frequency, pain with urination, burning sensation, blood in urine, odor or discharge. Neurological: Denies dizziness, difficulty with memory, difficulty with speech or problems with balance and coordination.    No other specific complaints in a complete review  of systems (except as listed in HPI above).  Objective:   Physical Exam  BP (!) 142/90   Pulse 92   Temp 97.7 F (36.5 C) (Temporal)   Wt 243 lb (110.2 kg)   SpO2 98%   BMI 33.89 kg/m   Wt Readings from Last 3 Encounters:  09/26/20 235 lb (106.6 kg)  08/06/20 243 lb (110.2 kg)  07/17/20 246 lb (111.6 kg)    General: Appears his stated age, obese, in NAD. HEENT: Head: normal shape and size; Eyes: sclera white, no icterus, conjunctiva pink, PERRLA and EOMs intact;  Cardiovascular: Normal rate and rhythm. S1,S2 noted.  No murmur, rubs or gallops noted.  Pulmonary/Chest: Normal effort and positive vesicular breath sounds. No respiratory distress. No wheezes, rales or ronchi noted.  Abdomen: Soft and tender in the epigastric, LUQ. Normal bowel sounds. No distention or masses noted. Liver, spleen and kidneys non palpable. Musculoskeletal:  No difficulty with gait.  Neurological: Alert and oriented.    BMET    Component Value Date/Time   NA 137 09/26/2020 1951   K 4.4 09/26/2020 1951   CL 102 09/26/2020 1951   CO2 21 (L) 09/26/2020 1951   GLUCOSE 133 (H) 09/26/2020 1951   BUN 18 09/26/2020 1951   CREATININE 1.06 09/26/2020 1951   CALCIUM 9.4 09/26/2020 1951   GFRNONAA >60 09/26/2020 1951   GFRAA >60 11/20/2019 0445    Lipid Panel     Component Value Date/Time   CHOL 157 02/05/2020 1058   CHOL 157 10/26/2019 1203   TRIG 74.0 02/05/2020 1058   HDL 47.10 02/05/2020 1058   HDL 48 10/26/2019 1203   CHOLHDL 3 02/05/2020 1058   VLDL 14.8 02/05/2020 1058   LDLCALC 95 02/05/2020  1058   LDLCALC 90 10/26/2019 1203    CBC    Component Value Date/Time   WBC 6.7 09/26/2020 1951   RBC 5.73 09/26/2020 1951   HGB 14.9 09/26/2020 1951   HCT 46.5 09/26/2020 1951   PLT 235 09/26/2020 1951   MCV 81.2 09/26/2020 1951   MCH 26.0 09/26/2020 1951   MCHC 32.0 09/26/2020 1951   RDW 14.3 09/26/2020 1951   LYMPHSABS 3.0 09/26/2020 1951   MONOABS 0.6 09/26/2020 1951   EOSABS 0.2 09/26/2020 1951   BASOSABS 0.1 09/26/2020 1951    Hgb A1C Lab Results  Component Value Date   HGBA1C 7.2 (H) 02/05/2020           Assessment & Plan:   ER Follow Up for GERD, Pancreatitis:  ER notes, labs and imaging reviewed RX for Omeprazole 20 mg daily for treatment of GERD Will repeat amylase and lipase Consider ultrasound abdomen vs CT pending labs  OSA:  Referral to pulm for sleep study  Return precautions discussed Webb Silversmith, NP This visit occurred during the SARS-CoV-2 public health emergency.  Safety protocols were in place, including screening questions prior to the visit, additional usage of staff PPE, and extensive cleaning of exam room while observing appropriate contact time as indicated for disinfecting solutions.

## 2020-10-28 NOTE — Progress Notes (Signed)
Virtual Visit via Telephone Note   This visit type was conducted due to national recommendations for restrictions regarding the COVID-19 Pandemic (e.g. social distancing) in an effort to limit this patient's exposure and mitigate transmission in our community.  Due to his co-morbid illnesses, this patient is at least at moderate risk for complications without adequate follow up.  This format is felt to be most appropriate for this patient at this time.  The patient did not have access to video technology/had technical difficulties with video requiring transitioning to audio format only (telephone).  All issues noted in this document were discussed and addressed.  No physical exam could be performed with this format.  Please refer to the patient's chart for his  consent to telehealth for [facility].    Date:  10/28/2020   ID:  JAMAIN COTTE, DOB [birth year], MRN [id number] The patient was identified using 2 identifiers.  Patient Location: Home Provider Location: Office/Clinic  PCP:  [name], NP  Cardiologist:  Georgetta Crafton Martin[place], MD  Electrophysiologist:  None   Evaluation Performed:  Follow-Up Visit  Chief Complaint:  Chest pain   History of Present Illness:    PRODIGY DUBOW is a 60 y.o. male with with a hx of HLD, DM II, OSA on CPAP and BPH. Patient was admitted on 02/28/2018 with right-sided chest pain radiating to his scapula. He also reported a similar episode of chest pain in 2016 for which he was evaluated at [facility] and underwent nuclear stress test which showed mild reversible ischemia involving apical segment but no evidence of MI, EF was 56%. This Myoview was later over read by cardiologist who felt that there was no signs of reversible ischemia. Upon presenting to the ED, initial EKG showed ST elevation in V2 and V3 with ST depression in the inferior leads. Initial troponin was negative. He underwent cardiac catheterization on 02/28/2018 which showed widely patent coronary  arteries with minimal coronary irregularities, normal EF with normal LVEDP. Differential diagnosis include coronary spasm versus noncardiac chest pain.Echocardiogram obtained on 03/01/2018 also showed EF 65 to 70%, mild LVH. He was started on low-dose Norvasc.  ABI obtained on 04/04/2018 showed noncompressible arteries, however triphasic blood flow throughout.  Patient was evaluated last year for  elevated PSA level.  Prostate biopsy revealed evidence of high risk prostate cancer.  Metastatic evaluation includes CT abdomen pelvis with contrast as well as bone scan were negative for any evidence of metastatic disease.  He is s/p  robotic assisted laparoscopic prostatectomy on 11/19/2019 by Dr. Hollice Espy of [facility]. Pre op ETT was low risk.   He has recovered well from that. States PSA is undetectable. He did have some right sided chest pain a couple of months ago. Some nausea. Treated for GERD with improvement.  The patient does not have symptoms concerning for COVID-19 infection (fever, chills, cough, or new shortness of breath).    Past Medical History:  Diagnosis Date  . Anxiety   . Arthritis    "knees and hips" (02/28/2018)  . Cancer [facility])    prostate   . Chicken pox   . Depression   . GERD (gastroesophageal reflux disease)   . Heart murmur   . Hernia, abdominal   . High cholesterol   . History of gout   . Migraine    "probably monthly" (02/28/2018)  . OSA on CPAP   . Pneumonia    "once" (02/28/2018)  . Refusal of blood transfusions as patient is  Jehovah's Witness   . Seasonal allergies   . Type II diabetes mellitus ([place])    Past Surgical History:  Procedure Laterality Date  . CARDIAC CATHETERIZATION  02/28/2018  . LEFT HEART CATH AND CORONARY ANGIOGRAPHY N/A 02/28/2018   Procedure: LEFT HEART CATH AND CORONARY ANGIOGRAPHY;  Surgeon: Sherren Mocha, MD;  Location: [place] [facility];  Service: Cardiovascular;  Laterality: N/A;  . ROBOT ASSISTED  LAPAROSCOPIC RADICAL PROSTATECTOMY N/A 11/19/2019   Procedure: XI ROBOTIC ASSISTED LAPAROSCOPIC RADICAL PROSTATECTOMY WITH LYMPH NODE DISSECTION;  Surgeon: Hollice Espy, MD;  Location: [facility] ORS;  Service: Urology;  Laterality: N/A;  . WRIST GANGLION EXCISION Left 1984     No outpatient medications have been marked as taking for the 10/29/20 encounter (Appointment) with Martinique, Damarri Rampy M, MD.     Allergies:   Tape   Social History   Tobacco Use  . Smoking status: Former Smoker    Packs/day: 1.00    Years: 27.00    Pack years: 27.00    Types: Cigarettes    Quit date: 10/18/2009    Years since quitting: 11.0  . Smokeless tobacco: Former Systems developer    Types: [place] date: 10/18/1994  Vaping Use  . Vaping Use: Never used  Substance Use Topics  . Alcohol use: Yes    Alcohol/week: 2.0 standard drinks    Types: 2 Cans of beer per week  . Drug use: No     Family Hx: The patient's family history includes Arthritis in his maternal grandfather, maternal grandmother, and paternal grandmother; Bladder Cancer in his father; Diabetes in his brother, maternal aunt, maternal grandmother, mother, and sister; Heart disease in his brother; Hyperlipidemia in his mother; Liver cancer in his sister. There is no history of Colon cancer.  ROS:   Please see the history of present illness.    All other systems reviewed and are negative.   Prior CV studies:   The following studies were reviewed today:  Cath 02/28/2018  The left ventricular ejection fraction is 55-65% by visual estimate.  LV end diastolic pressure is normal.  The left ventricular systolic function is normal.  1. Widely patent coronary arteries with minimal coronary irregularities 2. Normal LV systolic function with normal LVEDP  Diff dx includes coronary vasospasm versus noncardiac pain as most likely etiology of chest pain. Further management/evaluation per primary cardiology team.   ETT 10/31/19: Study Highlights   Patient  walked for 9 minutes and 28 seconds on a standard Bruce protocol treadmill test. He achieved a peak heart rate of 166 which is 101% predicted maximal heart rate.  He stopped due to some shortness of breath and fatigue.  Blood pressure response to exercise was normal.  At peak exercise there were very mild ST segment depressions which resolved very quickly during the recovery phase.  There is no QRS widening with exercise.  This is interpreted as a negative exercise test. There is no evidence of ischemia. He has good exercise capacity.    Labs/Other Tests and Data Reviewed:    EKG:  No ECG reviewed.  Recent Labs: 09/26/2020: ALT 27; BUN 18; Creatinine, Ser 1.06; Hemoglobin 14.9; Platelets 235; Potassium 4.4; Sodium 137   Recent Lipid Panel Lab Results  Component Value Date/Time   CHOL 157 02/05/2020 10:58 AM   CHOL 157 10/26/2019 12:03 PM   TRIG 74.0 02/05/2020 10:58 AM   HDL 47.10 02/05/2020 10:58 AM   HDL 48 10/26/2019 12:03 PM   CHOLHDL 3 02/05/2020 10:58  AM   LDLCALC 95 02/05/2020 10:58 AM   LDLCALC 90 10/26/2019 12:03 PM   LDLDIRECT 143.5 10/25/2013 04:02 PM    Wt Readings from Last 3 Encounters:  09/30/20 243 lb (110.2 kg)  09/26/20 235 lb (106.6 kg)  08/06/20 243 lb (110.2 kg)     Risk Assessment/Calculations:      Objective:    Vital Signs:  There were no vitals taken for this visit.   VITAL SIGNS:  reviewed  ASSESSMENT & PLAN:    1. Chest pain:   Previous cardiac catheterization in 2019 was negative, normal ETT last year. Suspect more related to GERD. Recommend follow up with primary care. We will see PRN.  2. Hyperlipidemia: Continue Lipitor  3. DM2: Managed per primary care provider         COVID-19 Education: The signs and symptoms of COVID-19 were discussed with the patient and how to seek care for testing (follow up with PCP or arrange E-visit).  The importance of social distancing was discussed today.  Time:   Today, I have spent 5  minutes with the patient with telehealth technology discussing the above problems.     Medication Adjustments/Labs and Tests Ordered: Current medicines are reviewed at length with the patient today.  Concerns regarding medicines are outlined above.   Tests Ordered: No orders of the defined types were placed in this encounter.   Medication Changes: No orders of the defined types were placed in this encounter.   Follow Up:  PRN  Signed, Giliana Vantil [place], MD  10/28/2020 6:39 PM    [place]

## 2020-10-29 ENCOUNTER — Encounter: Payer: Self-pay | Admitting: Cardiology

## 2020-10-29 ENCOUNTER — Telehealth (INDEPENDENT_AMBULATORY_CARE_PROVIDER_SITE_OTHER): Payer: No Typology Code available for payment source | Admitting: Cardiology

## 2020-10-29 DIAGNOSIS — E785 Hyperlipidemia, unspecified: Secondary | ICD-10-CM | POA: Diagnosis not present

## 2020-10-29 DIAGNOSIS — R0789 Other chest pain: Secondary | ICD-10-CM | POA: Diagnosis not present

## 2020-10-29 NOTE — Patient Instructions (Addendum)
Medication Instructions:  Continue same medications   Lab Work: None   Testing/Procedures: None   Follow-Up: At [facility], you and your health needs are our priority.  As part of our continuing mission to provide you with exceptional heart care, we have created designated Provider Care Teams.  These Care Teams include your primary Cardiologist (physician) and Advanced Practice Providers (APPs -  Physician Assistants and Nurse Practitioners) who all work together to provide you with the care you need, when you need it.  We recommend signing up for the patient portal called "MyChart".  Sign up information is provided on this After Visit Summary.  MyChart is used to connect with patients for Virtual Visits (Telemedicine).  Patients are able to view lab/test results, encounter notes, upcoming appointments, etc.  Non-urgent messages can be sent to your provider as well.   To learn more about what you can do with MyChart, go to [email or web address].    Your next appointment:  As Needed   The format for your next appointment: Office    Provider:  Dr.Jordan

## 2020-11-10 ENCOUNTER — Encounter: Payer: Self-pay | Admitting: Internal Medicine

## 2020-11-10 DIAGNOSIS — U071 COVID-19: Secondary | ICD-10-CM

## 2020-11-11 NOTE — Addendum Note (Signed)
Addended by: [name] on: 11/11/2020 02:36 PM   Modules accepted: Orders

## 2020-11-27 ENCOUNTER — Other Ambulatory Visit: Payer: Self-pay | Admitting: Internal Medicine

## 2020-11-27 ENCOUNTER — Other Ambulatory Visit: Payer: Self-pay | Admitting: Physician Assistant

## 2020-11-27 MED FILL — OMEPRAZOLE DR 20 MG CAPSULE: 20 | 90 days supply | Qty: 90 | Fill #1

## 2020-11-27 MED FILL — SILDENAFIL CITRATE 20 MG TA: 20 | 30 days supply | Qty: 30 | Fill #6

## 2020-11-27 MED FILL — buPROPion HCL ER (XL) 150 M: 150 | 90 days supply | Qty: 90 | Fill #1

## 2020-11-27 MED FILL — BUTALB-ACETAMIN-CAFF 50-325: 50-325-40 | 4 days supply | Qty: 30 | Fill #0

## 2020-11-27 MED FILL — ATORVASTATIN CALCIUM 10 MG: 10 | 90 days supply | Qty: 90 | Fill #0

## 2020-11-27 MED FILL — AMLODIPINE BESYLATE 5 MG TA: 5 | 90 days supply | Qty: 90 | Fill #0

## 2020-11-27 MED FILL — hydrOXYzine HCL 25 MG TABS: 25 | 90 days supply | Qty: 90 | Fill #2

## 2020-11-27 NOTE — Telephone Encounter (Signed)
Fioricet last filled 07/30/2020... please advise

## 2020-12-03 IMAGING — DX DG CERVICAL SPINE COMPLETE 4+V
6 series · 6 of 6 positions shown · non-contrast
Comparison: None.

CLINICAL DATA: Numbness and tingling in both upper extremities

EXAM:
CERVICAL SPINE - COMPLETE 4+ VIEW

[c-spine lat]
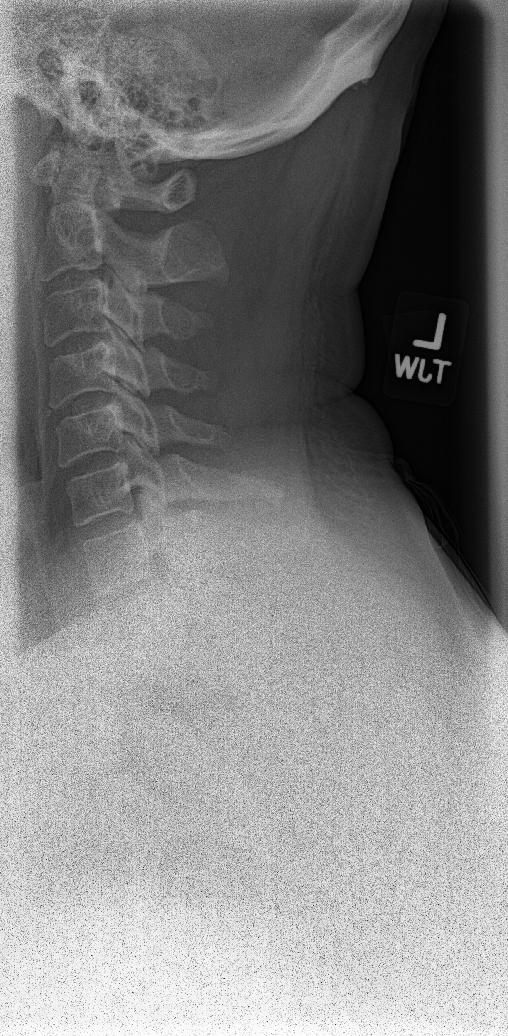

[c-spine obl (1 of 2)]
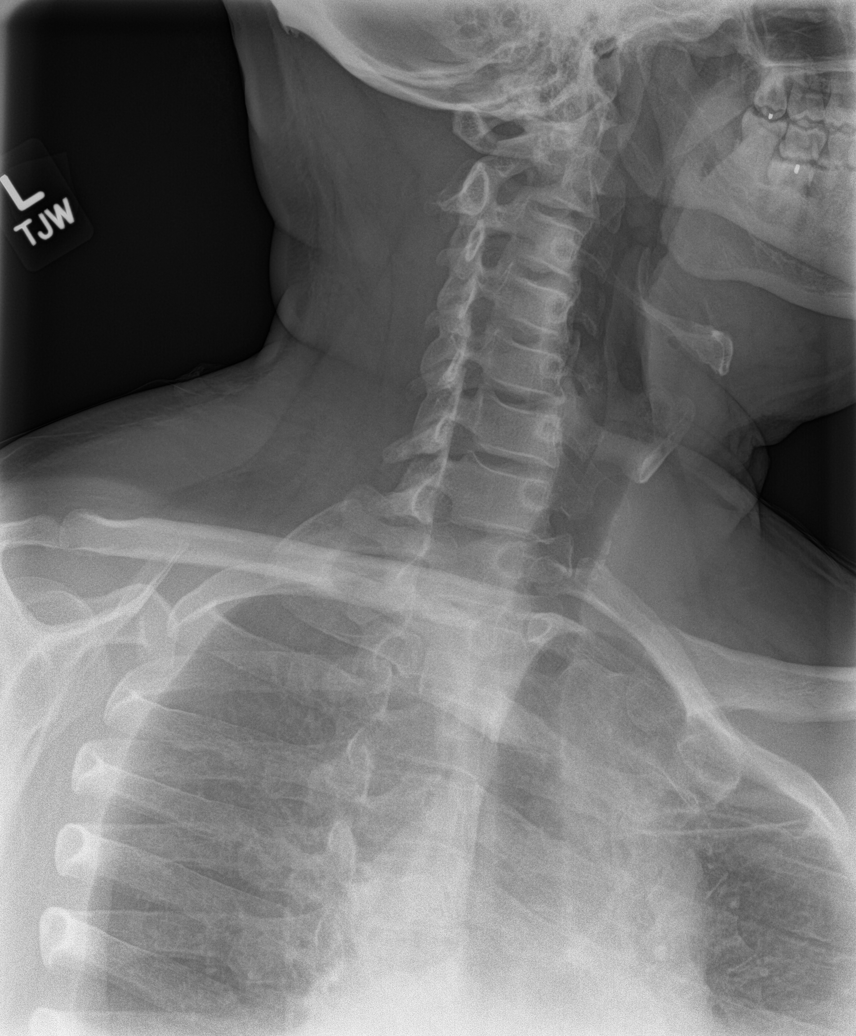

[c-spine obl (2 of 2)]
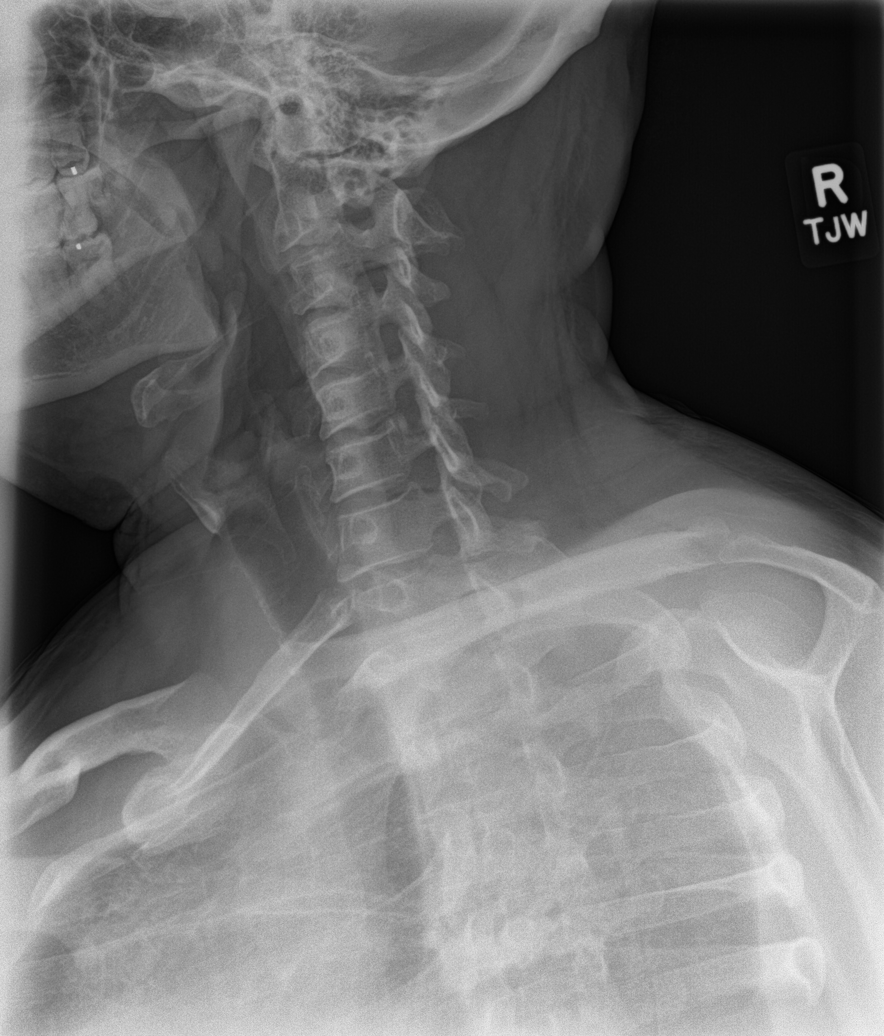

[c-spine ap]
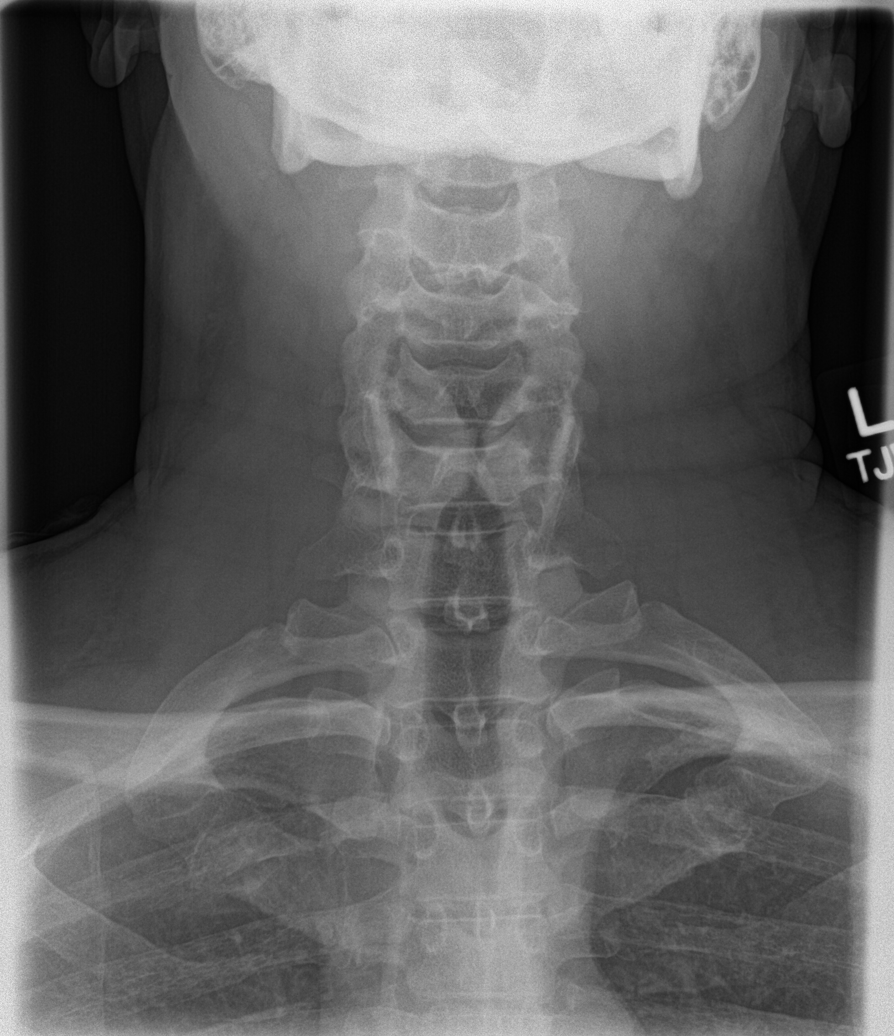

[c-spine open mouth]
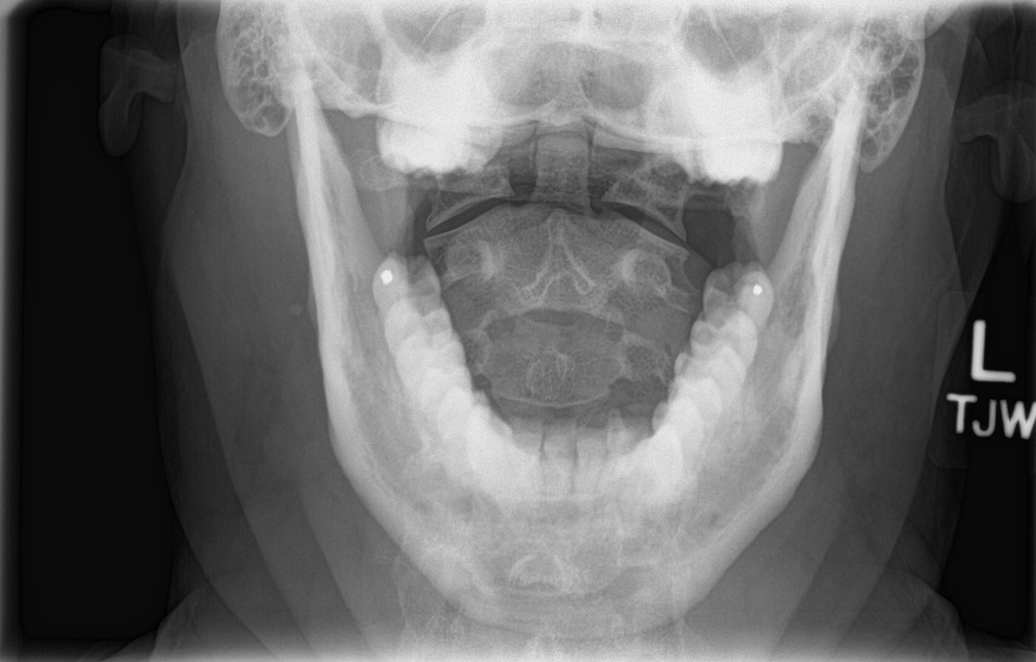

[c-spine swimmers]
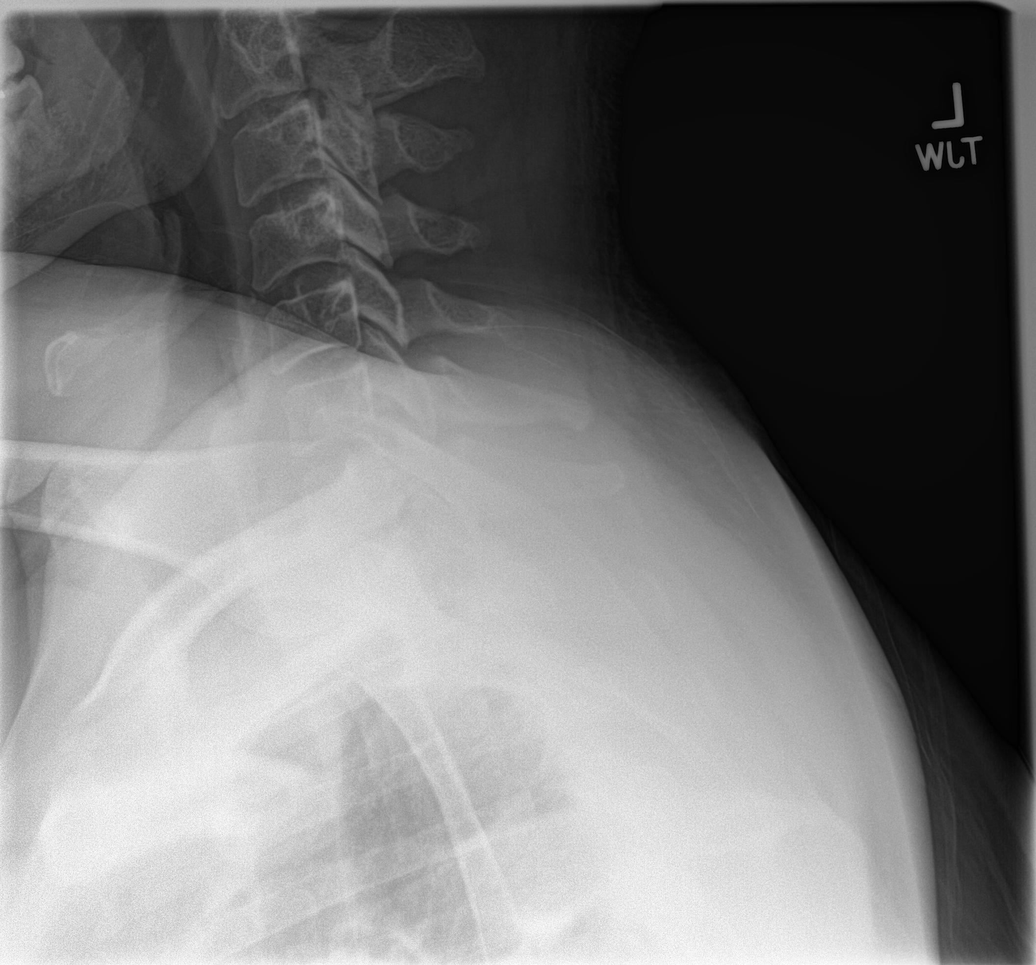

[6 of 6 positions shown; findings below may reference images not displayed]

FINDINGS: There is no evidence of cervical spine fracture or prevertebral soft
tissue swelling. Alignment is normal. No other significant bone
abnormalities are identified.
IMPRESSION: Negative cervical spine radiographs.

## 2020-12-26 ENCOUNTER — Encounter: Payer: Self-pay | Admitting: Internal Medicine

## 2020-12-26 DIAGNOSIS — R202 Paresthesia of skin: Secondary | ICD-10-CM

## 2020-12-26 DIAGNOSIS — G8929 Other chronic pain: Secondary | ICD-10-CM

## 2021-01-01 ENCOUNTER — Encounter: Payer: Self-pay | Admitting: Internal Medicine

## 2021-01-01 ENCOUNTER — Other Ambulatory Visit: Payer: Self-pay

## 2021-01-01 ENCOUNTER — Other Ambulatory Visit: Payer: Self-pay | Admitting: Internal Medicine

## 2021-01-01 ENCOUNTER — Ambulatory Visit (INDEPENDENT_AMBULATORY_CARE_PROVIDER_SITE_OTHER): Payer: No Typology Code available for payment source | Admitting: Internal Medicine

## 2021-01-01 ENCOUNTER — Other Ambulatory Visit: Payer: No Typology Code available for payment source

## 2021-01-01 VITALS — BP 130/76 | HR 100 | Temp 98.1°F | Wt 241.0 lb

## 2021-01-01 DIAGNOSIS — M542 Cervicalgia: Secondary | ICD-10-CM | POA: Diagnosis not present

## 2021-01-01 DIAGNOSIS — E119 Type 2 diabetes mellitus without complications: Secondary | ICD-10-CM

## 2021-01-01 DIAGNOSIS — Z6833 Body mass index (BMI) 33.0-33.9, adult: Secondary | ICD-10-CM

## 2021-01-01 DIAGNOSIS — R7309 Other abnormal glucose: Secondary | ICD-10-CM

## 2021-01-01 DIAGNOSIS — E6609 Other obesity due to excess calories: Secondary | ICD-10-CM

## 2021-01-01 DIAGNOSIS — E7800 Pure hypercholesterolemia, unspecified: Secondary | ICD-10-CM

## 2021-01-01 LAB — COMPREHENSIVE METABOLIC PANEL
ALT: 29 U/L (ref 0–53)
AST: 26 U/L (ref 0–37)
Albumin: 4.4 g/dL (ref 3.5–5.2)
Alkaline Phosphatase: 78 U/L (ref 39–117)
BUN: 17 mg/dL (ref 6–23)
CO2: 25 mEq/L (ref 19–32)
Calcium: 9.6 mg/dL (ref 8.4–10.5)
Chloride: 104 mEq/L (ref 96–112)
Creatinine, Ser: 0.96 mg/dL (ref 0.40–1.50)
GFR: 87.7 mL/min (ref >60.00)
Glucose, Bld: 146 mg/dL — ABNORMAL HIGH (ref 70–99)
Potassium: 4.1 mEq/L (ref 3.5–5.1)
Sodium: 137 mEq/L (ref 135–145)
Total Bilirubin: 0.5 mg/dL (ref 0.2–1.2)
Total Protein: 7.5 g/dL (ref 6.0–8.3)

## 2021-01-01 LAB — LIPID PANEL
Cholesterol: 202 mg/dL — ABNORMAL HIGH (ref 0–200)
HDL: 47.6 mg/dL (ref >39.00)
NonHDL: 154.71
Total CHOL/HDL Ratio: 4
Triglycerides: 301 mg/dL — ABNORMAL HIGH (ref 0.0–149.0)
VLDL: 60.2 mg/dL — ABNORMAL HIGH (ref 0.0–40.0)

## 2021-01-01 LAB — LDL CHOLESTEROL, DIRECT: Direct LDL: 127 mg/dL

## 2021-01-01 MED ORDER — CYCLOBENZAPRINE HCL 10 MG PO TABS: 10.0000 mg | ORAL_TABLET | Freq: Three times a day (TID) | ORAL | 0 refills | Status: DC | PRN

## 2021-01-01 MED ORDER — CITALOPRAM HYDROBROMIDE 40 MG PO TABS: 40.0000 mg | ORAL_TABLET | Freq: Every day | ORAL | 0 refills | Status: DC

## 2021-01-01 NOTE — Progress Notes (Signed)
Subjective:    Patient ID: Marc Aguilar, male    DOB: Feb 05, 1961, 60 y.o.   MRN: [id number]  HPI  Patient presents to the clinic today with complaint of neck pain.  He reports this started 1 week ago.  He describes the neck pain as tightness.  The right seems worse than the left.  He reports some associated numbness, tingling and weakness of his right upper extremity.  He denies headaches or dizziness.  He denies any injury to the area.  He has tried Tylenol and ibuprofen OTC with minimal relief of symptoms.  He had a normal cervical spine x-ray 10/2018  He is also due for follow-up of DM 2/HLD.  His last A1c was 7.2%, 01/2020.  His last LDL was 95, 01/2020.  He is taking Metformin and atorvastatin as prescribed.  He has been trying to consume a low-carb and low-fat diet.  He checks his feet routinely.  He has had a eye exam within the last year.  Flu 06/2020.  Covid Pfizer x3.  Pneumovax 10/2018.  Review of Systems  Past Medical History:  Diagnosis Date  . Anxiety   . Arthritis    "knees and hips" (02/28/2018)  . Cancer [facility])    prostate   . Chicken pox   . Depression   . GERD (gastroesophageal reflux disease)   . Heart murmur   . Hernia, abdominal   . High cholesterol   . History of gout   . Migraine    "probably monthly" (02/28/2018)  . OSA on CPAP   . Pneumonia    "once" (02/28/2018)  . Refusal of blood transfusions as patient is Jehovah's Witness   . Seasonal allergies   . Type II diabetes mellitus ([place])     Current Outpatient Medications  Medication Sig Dispense Refill  . albuterol (VENTOLIN HFA) 108 (90 Base) MCG/ACT inhaler Inhale 2 puffs into the lungs every 6 (six) hours as needed for wheezing or shortness of breath. 1 Inhaler 0  . Alcohol Swabs (ALCOHOL PREP) PADS 1 each by Does not apply route 2 (two) times daily. 200 each 3  . amLODipine (NORVASC) 5 MG tablet TAKE 1 TABLET (5 MG TOTAL) BY MOUTH DAILY 90 tablet 3  . aspirin EC 81 MG tablet Take 81 mg by mouth daily.     Marland Kitchen atorvastatin (LIPITOR) 10 MG tablet Take 1 tablet (10 mg total) by mouth at bedtime. 90 tablet 0  . buPROPion (WELLBUTRIN XL) 150 MG 24 hr tablet TAKE 1 TABLET (150 MG TOTAL) BY MOUTH DAILY. 90 tablet 1  . butalbital-acetaminophen-caffeine (FIORICET) 50-325-40 MG tablet Take 1-2 tablets by mouth every 6 (six) hours as needed for headache. 30 tablet 0  . glucose blood (FREESTYLE LITE) test strip 1 each by Other route in the morning and at bedtime. Use as instructed 200 each 2  . hydrOXYzine (ATARAX/VISTARIL) 25 MG tablet TAKE 1 TABLET (25 MG TOTAL) BY MOUTH AT BEDTIME AS NEEDED. (Patient taking differently: Take 25 mg by mouth at bedtime as needed (sleep).) 30 tablet 4  . Lancets MISC 1 each by Does not apply route in the morning and at bedtime. 200 each 2  . metFORMIN (GLUCOPHAGE) 1000 MG tablet Take 1 tablet (1,000 mg total) by mouth 2 (two) times daily with a meal. 180 tablet 1  . Multiple Vitamins-Minerals (MULTIVITAMIN WITH MINERALS) tablet Take 1 tablet by mouth daily.    Marland Kitchen omeprazole (PRILOSEC) 20 MG capsule Take 1 capsule (20 mg total) by mouth  daily. 30 capsule 3  . sildenafil (REVATIO) 20 MG tablet Take 1 tablet (20 mg total) by mouth as needed. Take 1-5 tabs as needed prior to intercourse 30 tablet 11  . citalopram (CELEXA) 40 MG tablet Take 1 tablet (40 mg total) by mouth daily. 90 tablet 0  . cyclobenzaprine (FLEXERIL) 10 MG tablet Take 1 tablet (10 mg total) by mouth 3 (three) times daily as needed for muscle spasms. 30 tablet 0   No current facility-administered medications for this visit.    Allergies  Allergen Reactions  . Tape Rash    Paper tape-blisters    Family History  Problem Relation Age of Onset  . Diabetes Mother   . Hyperlipidemia Mother   . Bladder Cancer Father   . Liver cancer Sister   . Diabetes Sister   . Diabetes Maternal Aunt   . Arthritis Maternal Grandmother   . Diabetes Maternal Grandmother   . Arthritis Maternal Grandfather   . Arthritis  Paternal Grandmother   . Diabetes Brother   . Heart disease Brother   . Colon cancer Neg Hx     Social History   Socioeconomic History  . Marital status: Married    Spouse name: Not on file  . Number of children: 2  . Years of education: Not on file  . Highest education level: Not on file  Occupational History  . 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 Smoking status: Former Smoker    Packs/day: 1.00    Years: 27.00    Pack years: 27.00    Types: Cigarettes    Quit date: 10/18/2009    Years since quitting: 11.2  . Smokeless tobacco: Former Systems developer    Types: [place] date: 10/18/1994  Vaping Use  . Vaping Use: Never used  Substance and Sexual Activity  . Alcohol use: Yes    Alcohol/week: 2.0 standard drinks    Types: 2 Cans of beer per week  . Drug use: No  . Sexual activity: Not Currently  Other Topics Concern  . Not on file  Social History Narrative  . Not on file   Social Determinants of Health   Financial Resource Strain: Not on file  Food Insecurity: Not on file  Transportation Needs: Not on file  Physical Activity: Not on file  Stress: Not on file  Social Connections: Not on file  Intimate Partner Violence: Not on file     Constitutional: Denies fever, malaise, fatigue, headache or abrupt weight changes.  Respiratory: Denies difficulty breathing, shortness of breath, cough or sputum production.   Cardiovascular: Denies chest pain, chest tightness, palpitations or swelling in the hands or feet.  Musculoskeletal: Patient reports neck pain, right arm weakness.  Denies decrease in range of motion, difficulty with gait, or joint  swelling.  Skin: Denies redness, rashes, lesions or ulcercations.  Neurological: Patient reports numbness and tingling of right arm.  Denies dizziness, difficulty with memory, difficulty with speech or problems with balance and coordination.    No other specific complaints in a complete review of systems (except as listed in HPI  above).     Objective:   Physical Exam   BP 130/76   Pulse 100   Temp 98.1 F (36.7 C) (Temporal)   Wt 109.3 kg   SpO2 97%   BMI 33.61 kg/m  Wt Readings from Last 3 Encounters:  01/01/21 109.3 kg  09/30/20 110.2 kg  09/26/20 106.6 kg    General: Appears his  stated age, obese, in NAD. Skin: Warm, dry and intact. No ulcerations noted. HEENT: Head: normal shape and size; Eyes: sclera white, no icterus, conjunctiva pink, PERRLA and EOMs intact;  Cardiovascular: Normal rate and rhythm. S1,S2 noted.  No murmur, rubs or gallops noted. No JVD or BLE edema. No carotid bruits noted. Pulmonary/Chest: Normal effort and positive vesicular breath sounds. No respiratory distress. No wheezes, rales or ronchi noted.  Musculoskeletal: Normal flexion, extension and rotation of the cervical spine.  No bony tenderness noted over the cervical spine.  Pain with palpation of the right paracervical muscles.  Strength 5/5 BUE.  Handgrips equal. Neurological: Alert and oriented. Coordination normal.    BMET    Component Value Date/Time   NA 137 01/01/2021 1047   K 4.1 01/01/2021 1047   CL 104 01/01/2021 1047   CO2 25 01/01/2021 1047   GLUCOSE 146 (H) 01/01/2021 1047   BUN 17 01/01/2021 1047   CREATININE 0.96 01/01/2021 1047   CALCIUM 9.6 01/01/2021 1047   GFRNONAA >60 09/26/2020 1951   GFRAA >60 11/20/2019 0445    Lipid Panel     Component Value Date/Time   CHOL 202 (H) 01/01/2021 1047   CHOL 157 10/26/2019 1203   TRIG 301.0 (H) 01/01/2021 1047   HDL 47.60 01/01/2021 1047   HDL 48 10/26/2019 1203   CHOLHDL 4 01/01/2021 1047   VLDL 60.2 (H) 01/01/2021 1047   LDLCALC 95 02/05/2020 1058   LDLCALC 90 10/26/2019 1203    CBC    Component Value Date/Time   WBC 6.7 09/26/2020 1951   RBC 5.73 09/26/2020 1951   HGB 14.9 09/26/2020 1951   HCT 46.5 09/26/2020 1951   PLT 235 09/26/2020 1951   MCV 81.2 09/26/2020 1951   MCH 26.0 09/26/2020 1951   MCHC 32.0 09/26/2020 1951   RDW 14.3  09/26/2020 1951   LYMPHSABS 3.0 09/26/2020 1951   MONOABS 0.6 09/26/2020 1951   EOSABS 0.2 09/26/2020 1951   BASOSABS 0.1 09/26/2020 1951    Hgb A1C Lab Results  Component Value Date   HGBA1C 6.1 (H) 01/01/2021           Assessment & Plan:   Acute Neck Pain:  Likely muscular No indication to repeat cervical x-ray at this time Rx for Flexeril 10 mg 3 times daily as needed-sedation caution given Encouraged heat and massage Neck exercises given  Return precautions discussed Webb Silversmith, NP This visit occurred during the SARS-CoV-2 public health emergency.  Safety protocols were in place, including screening questions prior to the visit, additional usage of staff PPE, and extensive cleaning of exam room while observing appropriate contact time as indicated for disinfecting solutions.

## 2021-01-01 NOTE — Patient Instructions (Signed)

## 2021-01-02 ENCOUNTER — Encounter: Payer: Self-pay | Admitting: Internal Medicine

## 2021-01-02 LAB — HEMOGLOBIN A1C
Hgb A1c MFr Bld: 6.1 % of total Hgb — ABNORMAL HIGH (ref <5.7)
Mean Plasma Glucose: 128 mg/dL
eAG (mmol/L): 7.1 mmol/L

## 2021-01-02 NOTE — Assessment & Plan Note (Signed)
C-Met, lipid and A1c today Encouraged him to consume a low carb diet and exercise for weight loss Continue Metformin Encourage routine eye exams Encourage routine foot exams Flu, Pneumovax and COVID UTD

## 2021-01-02 NOTE — Assessment & Plan Note (Signed)
C-Met and lipid profile today Encouraged him to consume a low-fat diet 

## 2021-01-06 ENCOUNTER — Encounter: Payer: Self-pay | Admitting: Internal Medicine

## 2021-01-06 DIAGNOSIS — E7800 Pure hypercholesterolemia, unspecified: Secondary | ICD-10-CM

## 2021-01-06 NOTE — Addendum Note (Signed)
Addended by: [name] on: 01/06/2021 11:47 AM   Modules accepted: Orders

## 2021-01-12 ENCOUNTER — Telehealth: Payer: Self-pay | Admitting: Internal Medicine

## 2021-01-12 MED FILL — FREESTYLE LITE TEST STRIP: 75 days supply | Qty: 150 | Fill #1

## 2021-01-12 NOTE — Telephone Encounter (Signed)
Referral re sent as requested and patient advised.

## 2021-01-12 NOTE — Telephone Encounter (Signed)
Patient called in wanted to know about switching to [facility] [address], [phone], due to the [place] [facility] is out of network.

## 2021-01-13 ENCOUNTER — Other Ambulatory Visit: Payer: Self-pay | Admitting: Family Medicine

## 2021-01-13 DIAGNOSIS — C61 Malignant neoplasm of prostate: Secondary | ICD-10-CM

## 2021-01-14 ENCOUNTER — Other Ambulatory Visit: Payer: No Typology Code available for payment source

## 2021-01-14 ENCOUNTER — Other Ambulatory Visit: Payer: Self-pay

## 2021-01-14 DIAGNOSIS — C61 Malignant neoplasm of prostate: Secondary | ICD-10-CM

## 2021-01-15 LAB — PSA: Prostate Specific Ag, Serum: <0.1 ng/mL (ref 0.0–4.0)

## 2021-01-20 ENCOUNTER — Telehealth: Payer: Self-pay | Admitting: *Deleted

## 2021-01-20 NOTE — Telephone Encounter (Addendum)
Patient notified, voiced understanding.   ----- Message from Hollice Espy, MD sent at 01/20/2021  8:11 AM EDT ----- PSA remains undetectable, great news  Hollice Espy, MD

## 2021-01-21 ENCOUNTER — Other Ambulatory Visit (HOSPITAL_COMMUNITY): Payer: Self-pay

## 2021-01-21 MED ORDER — ATORVASTATIN CALCIUM 20 MG PO TABS
20.0000 mg | ORAL_TABLET | Freq: Every day | ORAL | 0 refills | Status: DC
  Filled 2021-01-21: qty 90, 90d supply, fill #0

## 2021-01-21 NOTE — Addendum Note (Signed)
Addended by: Lindalou Hose Y on: 01/21/2021 03:00 PM   Modules accepted: Orders

## 2021-01-28 ENCOUNTER — Ambulatory Visit: Payer: No Typology Code available for payment source

## 2021-01-28 ENCOUNTER — Ambulatory Visit: Payer: No Typology Code available for payment source | Attending: Internal Medicine

## 2021-01-28 ENCOUNTER — Other Ambulatory Visit: Payer: Self-pay

## 2021-01-28 DIAGNOSIS — M5412 Radiculopathy, cervical region: Secondary | ICD-10-CM | POA: Insufficient documentation

## 2021-01-28 DIAGNOSIS — M6281 Muscle weakness (generalized): Secondary | ICD-10-CM | POA: Insufficient documentation

## 2021-01-28 DIAGNOSIS — M542 Cervicalgia: Secondary | ICD-10-CM | POA: Insufficient documentation

## 2021-01-28 NOTE — Therapy (Signed)
[facility] [address] [place], [address] Phone: [phone]   Fax:  [phone]  Physical Therapy Evaluation  Patient Details  Name: Marc Aguilar MRN: [id number] Date of Birth: August 01, 1961 Referring Provider (PT): Webb Silversmith, NP (PCP)   Encounter Date: 01/28/2021   PT End of Session - 01/28/21 1055    Visit Number 1    Number of Visits 17    Date for PT Re-Evaluation 02/11/21    Authorization Type [employer]    Authorization Time Period 01/28/21-04/22/21    PT Start Time 0915   pt arrived late   PT Stop Time 0945    PT Time Calculation (min) 30 min    Activity Tolerance Patient tolerated treatment well;No increased pain    Behavior During Therapy WFL for tasks assessed/performed           Past Medical History:  Diagnosis Date  . Anxiety   . Arthritis    "knees and hips" (02/28/2018)  . Cancer [facility])    prostate   . Chicken pox   . Depression   . GERD (gastroesophageal reflux disease)   . Heart murmur   . Hernia, abdominal   . High cholesterol   . History of gout   . Migraine    "probably monthly" (02/28/2018)  . OSA on CPAP   . Pneumonia    "once" (02/28/2018)  . Refusal of blood transfusions as patient is Jehovah's Witness   . Seasonal allergies   . Type II diabetes mellitus ([place])     Past Surgical History:  Procedure Laterality Date  . CARDIAC CATHETERIZATION  02/28/2018  . LEFT HEART CATH AND CORONARY ANGIOGRAPHY N/A 02/28/2018   Procedure: LEFT HEART CATH AND CORONARY ANGIOGRAPHY;  Surgeon: Sherren Mocha, MD;  Location: [facility];  Service: Cardiovascular;  Laterality: N/A;  . ROBOT ASSISTED LAPAROSCOPIC RADICAL PROSTATECTOMY N/A 11/19/2019   Procedure: XI ROBOTIC ASSISTED LAPAROSCOPIC RADICAL PROSTATECTOMY WITH LYMPH NODE DISSECTION;  Surgeon: Hollice Espy, MD;  Location: [facility];  Service: Urology;  Laterality: N/A;  . WRIST GANGLION EXCISION Left 1984     There were no vitals filed for this visit.    Subjective Assessment - 01/28/21 1052    Subjective Ken reports to OPPT from his PCP after 1 month of neck pain and paresthesias, pain, and cramping in arms.    Pertinent History Marc Aguilar is a 72yoM who present to [facility] for acute onset neck pain with pain across bilat shoulders and bilat arms. Pt is referred by his PCP, has had unremarkable cervical xrays, been taking both prescribed and nonprescribed analgesics, NSAID, and muscle relaxers with only mild improvement. He reports insidious onset about 1 month prior (mid March) first apparent upon lifting a table overhead a home, noting some RUE weakness. Pt has near constant pain/tightness across the top of neck/shoulders, with achiness predominantly in the Right anterior deltoid, biceps brachii, and Right proximal wrist flexors. Pt has had intermittent symptoms into the Rt pec major distribution as well as the left arm (shoulder to elbow). Pt reports when he lies in supine at home, both hands go numb. Pt also reports intermittent cramping in the right hand when gripping. Pt's symptoms are creating difficulty with putting on shirts and lifting heavy objects at home. 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  Pt denies any frank changes to gait/balance, but does reports increased cramping, soreness in bilat groin when standing at work- pt wears compression stocking bilat at baseline (knee high only). Pt denies any recently noted electrolyte abnormality, denies any diuretic therapy, denies any prior neck pain.    Limitations Lifting    How long can you sit comfortably? No    How long can you stand comfortably? No    How long can you walk comfortably? No    Diagnostic tests Unremarkable x ray study with PCP    Patient Stated Goals end paresthesias, decrease pain, having improve UE use at work  andhome    Currently in Pain? Yes    Pain Score 3     Pain Location --   neck and across the top of the shoulders             [facility] PT Assessment - 01/28/21 0001      Assessment   Medical Diagnosis Neck pain with arm referral    Referring Provider (PT) Webb Silversmith, NP (PCP)    Onset Date/Surgical Date --   March 2022   Hand Dominance Right    Prior Therapy None      Precautions   Precautions None      Balance Screen   Has the patient fallen in the past 6 months No    Has the patient had a decrease in activity level because of a fear of falling?  No    Is the patient reluctant to leave their home because of a fear of falling?  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   Vocation Requirements standing, walking, lifting, moving patients    Leisure Ready to return to [employer] for exercise      Cognition   Overall Cognitive Status Within Functional Limits for tasks assessed      Observation/Other Assessments   Focus on Therapeutic Outcomes (FOTO)  deferred to visit 2 for time      Sensation   Light Touch Not tested   deferred to visit 2 for time     Coordination   Gross Motor Movements are Fluid and Coordinated Yes      Bed Mobility   Bed Mobility Sit to Supine;Supine to Sit    Supine to Sit Independent    Sit to Supine Independent      Transfers   Transfers Sit to Stand    Sit to Stand 7: Independent      Ambulation/Gait   Ambulation/Gait Yes    Ambulation/Gait Assistance 7: Independent    Assistive device None    Gait Pattern Within Functional Limits      Balance   Balance Assessed No           Examination: Overground gait analysis, 190ft, no device, speed WNL for distance, no signs of gait ataxia, no frank  unsteadiness; Pt also ran out to car to get his phone without any frank abnormality in running mechanics.   DTR:  -Brachioradialis: 2+ bilat (negative inverted supinator sign) -Triceps: 0 bilat  -Patella: 0 bilat  -achilles: 0 bilat  Clonus:  -negative bilaterally   Hoffman's -negative bilaterally  Thumb find test:  -mild error with non-dominant hand to dominant hand -normal dominant to non-dominant   Cervical A/ROM: -extension/flexion both WNL, no symptoms provocation  Rotation:  -Left: ~65 degrees;  -Right: ~45 degrees, both provoke symptoms Lateral flexion:  -Left: 25 degrees, symptomatic pain across the shoulders -Right: apprehension, but ~20 degrees c active assistance and worse right arm symptoms  Neural Tension Screening, seated (A/ROM)  Median nerve:  -Left normal -Right symptomatic pain, does not improve with head approximation, not overt limb movement limitation Ulnar nerve: -Left, mild neural tension -Right, pain exacerbation, needs physical assistance but tolerance does not allow full excursion to testing position end.   Spurling's Cervical Compression Test: Test A, negative Test B positive with Left rotation + extension c Right arm symptoms; negative with Right rotation + extension   Cervical Distraction Test (manual traction in supine) -positive; reduction of RUE symptoms with test, although difficult for patient to fully relax neck and avoid self compression   Intervention -Tenderness and pain with pec minor right; tolerates supine pec minor stretch without symptoms exacerbation; 3x30sec, scapular retraction, posterior tilt P/ROM -Cervical retraction into 2 pillows 2x5x3secH; starting position in 50% flexion, ending position to cervical neutral (per patient's neutral); pt denies any exacerbation of peripheral pain during exercise *issued both as HEP   Objective measurements completed on examination: See above findings.     PT Education - 01/28/21  1054    Education Details Education on HEP performance, safe DC of actiivty with distal symptom provocation; guidelines and plan for returning to gym.    Person(s) Educated Patient    Methods Explanation;Demonstration    Comprehension Verbalized understanding;Returned demonstration            PT Short Term Goals - 01/28/21 1109      PT SHORT TERM GOAL #1   Title After 4 weeks pt will reports decreased incidence of RUE cramping phenomenon.    Time 4    Period Weeks    Status New    Target Date 02/25/21      PT SHORT TERM GOAL #2   Title After 4 weeks pt will demonstrate FOTO score increase by 10 points.    Time 4    Period Weeks    Status New    Target Date 02/25/21             PT Long Term Goals - 01/28/21 1113      PT LONG TERM GOAL #1   Title After 8 weeks pt will demonstrate improved cervical rotation ROM to >65 degrees bilaterally without provocation of UE symptoms.    Baseline At eval: Cervical rotation ~45 degrees.    Time 8    Period Weeks    Status New    Target Date 03/25/21      PT LONG TERM GOAL #2   Title After 8 weeks pt will be able to don doff 3 shirts without pain to demonstrate ability to donn doff 1 shirt at home.    Baseline Pain with shirt donning    Time 8    Period Weeks    Status New    Target Date 03/25/21      PT LONG TERM GOAL #3   Title After 8 weeks pt will demonstrate 5/5 strength in BUE and reports ability to resume ad lib PRN lifting of heavy objects at home.    Baseline weakness in RUE, reduced confidence    Time 8    Period Weeks  Status New    Target Date 03/25/21                  Plan - 01/28/21 1056    Clinical Impression Statement Pt presenting to OPPT after 1 month of neck pain associated with BUE paresthesias, cramping, and pain. Examination reveals no signs of UMN involvement as relex testing, hoffman's, clonus, all negative, no gait ataxia, no recent balance changes). Pt has a positive Spurlings compression  test, postiive distraction test, symptoms provocation with median and ulnar nerve mobility testing, and reduced cervical rotation to the right, all of which when combined strongly suggest cervical radiculopathy as the primary diagnosis. Shoulder is briefly screened this date, but can be looked into further at future visits. Pt is educated on some exercises to begin at home today, both of which are tolerated well in session. Pt will conitnue to benefit from skilled PT intervention to reduce pain, improve ROM in neck, improve strength in RUE, in order to improve tolerance and ability in household and work related duties.    Personal Factors and Comorbidities Profession;Time since onset of injury/illness/exacerbation    Examination-Activity Limitations Reach Overhead;Lift;Sleep    Examination-Participation Restrictions Cleaning;Occupation;Yard Work    Stability/Clinical Decision Making Unstable/Unpredictable    Clinical Decision Making Moderate    Rehab Potential Good    PT Frequency 2x / week    PT Duration 8 weeks    PT Treatment/Interventions ADLs/Self Care Home Management;Moist Heat;Traction;Therapeutic exercise;Therapeutic activities;Functional mobility training;Stair training;Dry needling;Manual techniques    PT Next Visit Plan FOTO survey, screen sensation in BUE, FU on HEP, trial repeated movements v opening-based movements to abate symptoms; screen UE strength when able (neuro screening style)    PT Home Exercise Plan Cervical retraction 2 pillows to neutral, supine scapular retraction pec minor stretch    Consulted and Agree with Plan of Care Patient           Patient will benefit from skilled therapeutic intervention in order to improve the following deficits and impairments:  Decreased range of motion,Decreased activity tolerance,Decreased strength,Impaired flexibility,Pain,Improper body mechanics,Postural dysfunction  Visit Diagnosis: Radiculopathy, cervical  region  Cervicalgia     Problem List Patient Active Problem List   Diagnosis Date Noted  . Prostate cancer ([place]) 11/19/2019  . Osteoarthritis 11/14/2018  . Migraines 11/14/2018  . Anxiety and depression 11/14/2018  . HTN (hypertension) 11/14/2018  . Benign prostatic hyperplasia with urinary frequency 10/26/2016  . HLD (hyperlipidemia) 10/02/2015  . OSA (obstructive sleep apnea) 03/13/2015  . Seasonal allergies 03/13/2015  . GERD (gastroesophageal reflux disease) 03/13/2015  . Diabetes mellitus ([place]) 10/30/2013   11:43 AM, 01/28/21 Etta Grandchild, PT, DPT Physical Therapist - [facility] [phone] (Office)    Rilya Longo C 01/28/2021, 11:43 AM  [facility] [address] [place], [address] Phone: [phone]   Fax:  [phone]  Name: NOEH SPARACINO MRN: [id number] Date of Birth: [birth year]

## 2021-02-02 ENCOUNTER — Ambulatory Visit: Payer: No Typology Code available for payment source

## 2021-02-04 ENCOUNTER — Ambulatory Visit: Payer: No Typology Code available for payment source

## 2021-02-04 ENCOUNTER — Other Ambulatory Visit: Payer: Self-pay

## 2021-02-04 DIAGNOSIS — M542 Cervicalgia: Secondary | ICD-10-CM

## 2021-02-04 DIAGNOSIS — M5412 Radiculopathy, cervical region: Secondary | ICD-10-CM

## 2021-02-04 DIAGNOSIS — M6281 Muscle weakness (generalized): Secondary | ICD-10-CM

## 2021-02-04 NOTE — Therapy (Signed)
[facility] [address], [place], [address] Phone: [phone]   Fax:  [phone]  Physical Therapy Treatment  Patient Details  Name: FARD BORUNDA MRN: [id number] Date of Birth: [birth year] Referring Provider (PT): Webb Silversmith, NP (PCP)   Encounter Date: 02/04/2021   PT End of Session - 02/04/21 0904    Visit Number 2    Number of Visits 17    Date for PT Re-Evaluation 02/11/21    Authorization Type [employer]    Authorization Time Period 01/28/21-04/22/21    PT Start Time 0902    PT Stop Time 0945    PT Time Calculation (min) 43 min    Activity Tolerance Patient tolerated treatment well;No increased pain    Behavior During Therapy WFL for tasks assessed/performed           Past Medical History:  Diagnosis Date  . Anxiety   . Arthritis    "knees and hips" (02/28/2018)  . Cancer [facility])    prostate   . Chicken pox   . Depression   . GERD (gastroesophageal reflux disease)   . Heart murmur   . Hernia, abdominal   . High cholesterol   . History of gout   . Migraine    "probably monthly" (02/28/2018)  . OSA on CPAP   . Pneumonia    "once" (02/28/2018)  . Refusal of blood transfusions as patient is Jehovah's Witness   . Seasonal allergies   . Type II diabetes mellitus ([place])     Past Surgical History:  Procedure Laterality Date  . CARDIAC CATHETERIZATION  02/28/2018  . LEFT HEART CATH AND CORONARY ANGIOGRAPHY N/A 02/28/2018   Procedure: LEFT HEART CATH AND CORONARY ANGIOGRAPHY;  Surgeon: Sherren Mocha, MD;  Location: [facility];  Service: Cardiovascular;  Laterality: N/A;  . ROBOT ASSISTED LAPAROSCOPIC RADICAL PROSTATECTOMY N/A 11/19/2019   Procedure: XI ROBOTIC ASSISTED LAPAROSCOPIC RADICAL PROSTATECTOMY WITH LYMPH NODE DISSECTION;  Surgeon: Hollice Espy, MD;  Location: [facility];  Service: Urology;  Laterality: N/A;  . WRIST GANGLION EXCISION Left 1984    There were no  vitals filed for this visit.   Subjective Assessment - 02/04/21 0902    Subjective Pt reports persistent neck pain and RUE pain with N/T. Felt worse after initial eval testing. Current pain recorded at: 3-4/10 NPS. HEP has not been beneficial so far.    Pertinent History Yardley Lekas is a 43yoM who present to [place] for acute onset neck pain with pain across bilat shoulders and bilat arms. Pt is referred by his PCP, has had unremarkable cervical xrays, been taking both prescribed and nonprescribed analgesics, NSAID, and muscle relaxers with only mild improvement. He reports insidious onset about 1 month prior (mid March) first apparent upon lifting a table overhead a home, noting some RUE weakness. Pt has near constant pain/tightness across the top of neck/shoulders, with achiness predominantly in the Right anterior deltoid, biceps brachii, and Right proximal wrist flexors. Pt has had intermittent symptoms into the Rt pec major distribution as well as the left arm (shoulder to elbow). Pt reports when he lies in supine at home, both hands go numb. Pt also reports intermittent cramping in the right hand when gripping. Pt's symptoms are creating difficulty with putting on shirts and lifting heavy objects at home. 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 Pt denies any frank changes to gait/balance, but does reports increased cramping, soreness in bilat groin when standing at work- pt wears compression stocking bilat at baseline (knee high only). Pt denies any recently noted electrolyte abnormality, denies any diuretic therapy, denies any prior neck pain.    Limitations Lifting    How long can you sit comfortably? No    How long can you stand comfortably? No    How long can you walk comfortably? No    Diagnostic tests Unremarkable x ray study with PCP    Patient Stated Goals end paresthesias,  decrease pain, having improve UE use at work andhome    Pain Score 4     Pain Location Neck            Treatment:   First 10 min focused on finalizing eval tasks due to pt being late to initial eval.   FOTO score: 50/66   BUE sensation to LT C2-T2:     - C3, C5, C6, C7, T1,T2: R abnormal, diminished sensation with LUE normal.                                    - C4 and C8: B normal              BUE myotome testing R/L:     - C4, C5: normal, concordant pain on R    - C6 and C7: R concordant pain and weakness                                                            Grip strength:    - B, no significant weakness                                      - R side concordant pain   Signs/symptom findings reinforce suggestion from eval and of cervical radiculopathy due to strength and LT changes on RUE vs LUE.    Supine repeated cervical extensions into single pillow. Initially required VC/TC for reducing cervical extension. No peripheralization or centralization of symptoms. 1x15, 3 sec holds. Increased referred pain in R bicep so discontinued.   Pt educated on pain modulation techniques with resting RUE overhead to relax tension on RUE nerves with multiple ways to perform. Pt independently demonstrated and reported improvement in symptoms after PT education and demo.   Seated scap retractions: 2x5, with initial VC for reducing R upper trap activation with good carryover on second set.     Manual Therapy:   Pt in supine. Manual traction technique on cervical spine for pain modulation. 5x30 sec holds. Pt reports elimination of R bicep referred/radicular pain. Towards Last 2 bouts, pt reports concordant C4-C5 chest dermatomal region concordant pain that eases to elimination of symptoms as traction continued.    Pt in prone with cervical spine slightly flexed:    Grade 2 CPA mobs from C2-T2 1x15 sec for pain modulation    Grade 3 CPA mobs from C2-T4 1x30 sec for improved cervical mobility.  Concordant, referred/radicular pain with C5-C7 mobs initially to innervated segments however improve to  no pain as Grade 3 mobs persist.     Post treatment pt reports improvement of RUE pain from 4/10 NPS to 1/10 NPS with reports of improved cervical AROM  that is non painful and able to don/doff jacket with 1/10 pain NPS vs 3-4/10 NPS prior to treatment.     PT Education - 02/04/21 0904    Education Details form/technique with exercises.    Person(s) Educated Patient    Methods Explanation;Demonstration;Tactile cues;Verbal cues    Comprehension Verbalized understanding;Returned demonstration            PT Short Term Goals - 01/28/21 1109      PT SHORT TERM GOAL #1   Title After 4 weeks pt will reports decreased incidence of RUE cramping phenomenon.    Time 4    Period Weeks    Status New    Target Date 02/25/21      PT SHORT TERM GOAL #2   Title After 4 weeks pt will demonstrate FOTO score increase by 10 points.    Time 4    Period Weeks    Status New    Target Date 02/25/21             PT Long Term Goals - 01/28/21 1113      PT LONG TERM GOAL #1   Title After 8 weeks pt will demonstrate improved cervical rotation ROM to >65 degrees bilaterally without provocation of UE symptoms.    Baseline At eval: Cervical rotation ~45 degrees.    Time 8    Period Weeks    Status New    Target Date 03/25/21      PT LONG TERM GOAL #2   Title After 8 weeks pt will be able to don doff 3 shirts without pain to demonstrate ability to donn doff 1 shirt at home.    Baseline Pain with shirt donning    Time 8    Period Weeks    Status New    Target Date 03/25/21      PT LONG TERM GOAL #3   Title After 8 weeks pt will demonstrate 5/5 strength in BUE and reports ability to resume ad lib PRN lifting of heavy objects at home.    Baseline weakness in RUE, reduced confidence    Time 8    Period Weeks    Status New    Target Date 03/25/21                 Plan - 02/04/21  0956    Clinical Impression Statement Finished testing for initial eval. Pt scored 50 on FOTO with a target score of 66. Decreased sensatoin to LT noted at C3, C5-T2 compared to LUE. Normal Isometric cervical strength. Myotome testing consistent with dermatomes with decreased strength and concordant pain with Deltoid (C5), biceps (C5-C6), and triceps (C7) scoring 4/5 via MMT. Equal grip strength but painful on R vs L, and equal finger add/abd strength testing. Prior to treatment, pt reports 4/10 pain NPS in neck and R bicep. Application of manual therapy and repeated cervical motions applied and reassement of HEP. No peripheralization or centralization of RUE symptoms with repeated cervical retraction. After manual therapy pt did report no biceps pain and cervical pain redcued to 1/10 NPS and reports of improved, pain free cervical AROM and 1/10 NPS with donning jacket where prior to therapy he had 3-4/10 pain NPS with jacket donning. Pt's pain musculoskeletal in nature and was able to display promising decrease  in pain levels with therapy. Pt given exercises and pain modulation techniques while performing work tasks. Pt will benefit from further skilled PT services to address cervical radiculopathy pain and improve AROM and strength so pt can return to PLOF.    Personal Factors and Comorbidities Profession;Time since onset of injury/illness/exacerbation    Examination-Activity Limitations Reach Overhead;Lift;Sleep    Examination-Participation Restrictions Cleaning;Occupation;Yard Work    Stability/Clinical Decision Making Unstable/Unpredictable    Rehab Potential Good    PT Frequency 2x / week    PT Duration 8 weeks    PT Treatment/Interventions ADLs/Self Care Home Management;Moist Heat;Traction;Therapeutic exercise;Therapeutic activities;Functional mobility training;Stair training;Dry needling;Manual techniques    PT Next Visit Plan Manual therapy on cervical spine (manual traction and CPA's). Address  cervical AROM exercises to improve motion if pain improving.    PT Home Exercise Plan Cervical retraction 2 pillows to neutral, supine scapular retraction pec minor stretch    Consulted and Agree with Plan of Care Patient           Patient will benefit from skilled therapeutic intervention in order to improve the following deficits and impairments:  Decreased range of motion,Decreased activity tolerance,Decreased strength,Impaired flexibility,Pain,Improper body mechanics,Postural dysfunction  Visit Diagnosis: Radiculopathy, cervical region  Cervicalgia  Muscle weakness (generalized)     Problem List Patient Active Problem List   Diagnosis Date Noted  . Prostate cancer ([place]) 11/19/2019  . Osteoarthritis 11/14/2018  . Migraines 11/14/2018  . Anxiety and depression 11/14/2018  . HTN (hypertension) 11/14/2018  . Benign prostatic hyperplasia with urinary frequency 10/26/2016  . HLD (hyperlipidemia) 10/02/2015  . OSA (obstructive sleep apnea) 03/13/2015  . Seasonal allergies 03/13/2015  . GERD (gastroesophageal reflux disease) 03/13/2015  . Diabetes mellitus ([place]) 10/30/2013    Salem Caster. Fairly IV, PT, DPT Physical Therapist- [facility]  02/04/2021, 10:25 AM  [facility] [address] [place], [address] Phone: [phone]   Fax:  [phone]  Name: JAHLIL ZILLER MRN: [id number] Date of Birth: June 14, 1961

## 2021-02-09 ENCOUNTER — Other Ambulatory Visit: Payer: Self-pay

## 2021-02-09 ENCOUNTER — Ambulatory Visit: Payer: No Typology Code available for payment source

## 2021-02-09 DIAGNOSIS — M6281 Muscle weakness (generalized): Secondary | ICD-10-CM

## 2021-02-09 DIAGNOSIS — M542 Cervicalgia: Secondary | ICD-10-CM

## 2021-02-09 DIAGNOSIS — M5412 Radiculopathy, cervical region: Secondary | ICD-10-CM | POA: Diagnosis not present

## 2021-02-09 NOTE — Therapy (Signed)
[facility] [address] [place], [address] Phone: [phone]   Fax:  [phone]  Physical Therapy Treatment  Patient Details  Name: Marc Aguilar MRN: [id number] Date of Birth: 02/01/1961 Referring Provider (PT): Marc Silversmith, NP (PCP)   Encounter Date: 02/09/2021   PT End of Session - 02/09/21 0955    Visit Number 3    Number of Visits 17    Date for PT Re-Evaluation 02/11/21    Authorization Type [employer]    Authorization Time Period 01/28/21-04/22/21    PT Start Time 0859    PT Stop Time 0946    PT Time Calculation (min) 47 min    Activity Tolerance Patient tolerated treatment well;No increased pain    Behavior During Therapy WFL for tasks assessed/performed           Past Medical History:  Diagnosis Date  . Anxiety   . Arthritis    "knees and hips" (02/28/2018)  . Cancer [facility])    prostate   . Chicken pox   . Depression   . GERD (gastroesophageal reflux disease)   . Heart murmur   . Hernia, abdominal   . High cholesterol   . History of gout   . Migraine    "probably monthly" (02/28/2018)  . OSA on CPAP   . Pneumonia    "once" (02/28/2018)  . Refusal of blood transfusions as patient is Jehovah's Witness   . Seasonal allergies   . Type II diabetes mellitus ([place])     Past Surgical History:  Procedure Laterality Date  . CARDIAC CATHETERIZATION  02/28/2018  . LEFT HEART CATH AND CORONARY ANGIOGRAPHY N/A 02/28/2018   Procedure: LEFT HEART CATH AND CORONARY ANGIOGRAPHY;  Surgeon: Sherren Mocha, MD;  Location: [facility];  Service: Cardiovascular;  Laterality: N/A;  . ROBOT ASSISTED LAPAROSCOPIC RADICAL PROSTATECTOMY N/A 11/19/2019   Procedure: XI ROBOTIC ASSISTED LAPAROSCOPIC RADICAL PROSTATECTOMY WITH LYMPH NODE DISSECTION;  Surgeon: Hollice Espy, MD;  Location: [facility];  Service: Urology;  Laterality: N/A;  . WRIST GANGLION EXCISION Left 1984    There were no  vitals filed for this visit.   Subjective Assessment - 02/09/21 0859    Subjective Pt reports reduction in R bicep pain but still feels "flabby". 3/10 pain across neck. Pt able to go to gym this morning prior to treatment and may contribute to current pain.    Pertinent History Marc Aguilar is a 76yoM who present to [place] for acute onset neck pain with pain across bilat shoulders and bilat arms. Pt is referred by his PCP, has had unremarkable cervical xrays, been taking both prescribed and nonprescribed analgesics, NSAID, and muscle relaxers with only mild improvement. He reports insidious onset about 1 month prior (mid March) first apparent upon lifting a table overhead a home, noting some RUE weakness. Pt has near constant pain/tightness across the top of neck/shoulders, with achiness predominantly in the Right anterior deltoid, biceps brachii, and Right proximal wrist flexors. Pt has had intermittent symptoms into the Rt pec major distribution as well as the left arm (shoulder to elbow). Pt reports when he lies in supine at home, both hands go numb. Pt also reports intermittent cramping in the right hand when gripping. Pt's symptoms are creating difficulty with putting on shirts and lifting heavy objects at home. 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 Pt denies any frank changes to gait/balance, but does reports increased cramping, soreness in bilat groin when standing at work- pt wears compression stocking bilat at baseline (knee high only). Pt denies any recently noted electrolyte abnormality, denies any diuretic therapy, denies any prior neck pain.    Limitations Lifting    How long can you sit comfortably? No    How long can you stand comfortably? No    How long can you walk comfortably? No    Diagnostic tests Unremarkable x ray study with PCP    Patient Stated Goals end paresthesias,  decrease pain, having improve UE use at work andhome    Currently in Pain? Yes    Pain Score 3     Pain Location Neck    Pain Orientation Right;Mid          Manual therapy:   Pt in supine    4x30 sec manual cervical traction to improve pain in cervical region and RUE. Pt reports reduction in pain form 3/10 NPS to 1/10 NPS   Pt in prone with cervical spine flexed   C2-T2 CPA's grade 2 for pain modulation 1x30 sec    C2-T2 CPA's grade 3 to improve cervical ROM, 2x15 sec/segment   R/L UPA's C3-C7 grade 3 to improve cervical rotation ROM, 1x15/segment    CPA's Grade T3-T7. Reproduced new, referred pain in dermatomal pattern across B ribs post to ant. Same referred pain with light palpation to B rhomboids and thoracic paraspinals. Once removed palpation, there was an elimination of symptoms.    There.ex:   Seated exercise ball roll outs forward/R/L to address thoracic mobility restrictions: x10/direction. Initial concordant pain in RUE (ant delt region). With VC on form, no more pain   Seated thoracic extension arms across chest: x10. Added extension with rotation to R and L x10/direction. VC and PT demo for improved form/technique with fair carryover after cues.   Seated cervical SNAGs: x10 R, x10 L. PT demo and VC/TC for correct form/technique. Difficulty maintaining cervical rotation and compensated with thoracic rotation. Regressed to cervical rotation without towel with improved carryover x10/direction.   Seated UT stretch and scap retractions to add to HEP to improve neck mobility and for postural re-ed. X10/direction, x10 scap retractions. Initial PT demo and VC/TC for form/technique with good carryover after cuing.    PT Education - 02/09/21 0955    Education Details form/technique with exercise.    Person(s) Educated Patient    Methods Explanation;Demonstration;Tactile cues;Verbal cues    Comprehension Verbalized understanding;Returned demonstration            PT Short Term  Goals - 02/09/21 1003      PT SHORT TERM GOAL #1   Title After 4 weeks pt will reports decreased incidence of RUE cramping phenomenon.    Time 4    Period Weeks    Status New    Target Date 02/25/21      PT SHORT TERM GOAL #2   Title After 4 weeks pt will demonstrate FOTO score increase by 10 points.    Baseline 4/20: 50    Time 4    Period Weeks    Status New    Target Date 02/25/21             PT Long Term Goals - 01/28/21 1113      PT LONG TERM GOAL #1   Title After 8 weeks pt will demonstrate improved cervical rotation  ROM to >65 degrees bilaterally without provocation of UE symptoms.    Baseline At eval: Cervical rotation ~45 degrees.    Time 8    Period Weeks    Status New    Target Date 03/25/21      PT LONG TERM GOAL #2   Title After 8 weeks pt will be able to don doff 3 shirts without pain to demonstrate ability to donn doff 1 shirt at home.    Baseline Pain with shirt donning    Time 8    Period Weeks    Status New    Target Date 03/25/21      PT LONG TERM GOAL #3   Title After 8 weeks pt will demonstrate 5/5 strength in BUE and reports ability to resume ad lib PRN lifting of heavy objects at home.    Baseline weakness in RUE, reduced confidence    Time 8    Period Weeks    Status New    Target Date 03/25/21                 Plan - 02/09/21 0956    Clinical Impression Statement Manual therapy continues to benefit pt with reductions of radicular pain from 3/10 to 1/10 NPS. With palpation to B rhomboids, and thoracic paraspinals pt reported referred pain along B rib cage that eases with removal of palpation. Pt tolerated cervical and thoracic mobility with slight increase in ant delt and chest referred pain that eased with verbal cuing for better form/technique. Pt educated on cervical and thoracic postural exercises to add to HEP with no questions after PT education. Pt can benefit from further PT services to further address cervical/thoracic ROM,  decrease pain, and improve strength so pt can return to PLOF.    Personal Factors and Comorbidities Profession;Time since onset of injury/illness/exacerbation    Examination-Activity Limitations Reach Overhead;Lift;Sleep    Examination-Participation Restrictions Cleaning;Occupation;Yard Work    Stability/Clinical Decision Making Unstable/Unpredictable    Rehab Potential Good    PT Frequency 2x / week    PT Duration 8 weeks    PT Treatment/Interventions ADLs/Self Care Home Management;Moist Heat;Traction;Therapeutic exercise;Therapeutic activities;Functional mobility training;Stair training;Dry needling;Manual techniques    PT Next Visit Plan Manual therapy on cervical spine (manual traction and CPA's). Address cervical AROM exercises to improve motion if pain improving.    PT Home Exercise Plan Cervical retraction 2 pillows to neutral, supine scapular retraction pec minor stretch    Consulted and Agree with Plan of Care Patient           Patient will benefit from skilled therapeutic intervention in order to improve the following deficits and impairments:  Decreased range of motion,Decreased activity tolerance,Decreased strength,Impaired flexibility,Pain,Improper body mechanics,Postural dysfunction  Visit Diagnosis: Radiculopathy, cervical region  Cervicalgia  Muscle weakness (generalized)     Problem List Patient Active Problem List   Diagnosis Date Noted  . Prostate cancer ([place]) 11/19/2019  . Osteoarthritis 11/14/2018  . Migraines 11/14/2018  . Anxiety and depression 11/14/2018  . HTN (hypertension) 11/14/2018  . Benign prostatic hyperplasia with urinary frequency 10/26/2016  . HLD (hyperlipidemia) 10/02/2015  . OSA (obstructive sleep apnea) 03/13/2015  . Seasonal allergies 03/13/2015  . GERD (gastroesophageal reflux disease) 03/13/2015  . Diabetes mellitus ([place]) 10/30/2013    Salem Caster. Fairly IV, PT, DPT Physical Therapist- [place]  02/09/2021, 10:06 AM  [facility] [address] [place], [place],  [place] Phone: [phone]   Fax:  [phone]  Name: Marc Aguilar MRN: [id number] Date of Birth: [birth year]

## 2021-02-11 ENCOUNTER — Other Ambulatory Visit: Payer: Self-pay

## 2021-02-11 ENCOUNTER — Ambulatory Visit: Payer: No Typology Code available for payment source

## 2021-02-11 DIAGNOSIS — M5412 Radiculopathy, cervical region: Secondary | ICD-10-CM

## 2021-02-11 DIAGNOSIS — M6281 Muscle weakness (generalized): Secondary | ICD-10-CM

## 2021-02-11 DIAGNOSIS — M542 Cervicalgia: Secondary | ICD-10-CM

## 2021-02-11 NOTE — Therapy (Signed)
[facility] [address], [place], [address] Phone: [phone]   Fax:  [phone]  Physical Therapy Treatment  Patient Details  Name: Marc Aguilar MRN: [id number] Date of Birth: 04-Dec-1960 Referring Provider (PT): Webb Silversmith, NP (PCP)   Encounter Date: 02/11/2021   PT End of Session - 02/11/21 1009    Visit Number 4    Number of Visits 17    Date for PT Re-Evaluation 02/11/21    Authorization Type [employer]    Authorization Time Period 01/28/21-04/22/21    PT Start Time 0902    PT Stop Time 0951    PT Time Calculation (min) 49 min    Activity Tolerance Patient tolerated treatment well;No increased pain    Behavior During Therapy WFL for tasks assessed/performed           Past Medical History:  Diagnosis Date  . Anxiety   . Arthritis    "knees and hips" (02/28/2018)  . Cancer [facility])    prostate   . Chicken pox   . Depression   . GERD (gastroesophageal reflux disease)   . Heart murmur   . Hernia, abdominal   . High cholesterol   . History of gout   . Migraine    "probably monthly" (02/28/2018)  . OSA on CPAP   . Pneumonia    "once" (02/28/2018)  . Refusal of blood transfusions as patient is Jehovah's Witness   . Seasonal allergies   . Type II diabetes mellitus ([place])     Past Surgical History:  Procedure Laterality Date  . CARDIAC CATHETERIZATION  02/28/2018  . LEFT HEART CATH AND CORONARY ANGIOGRAPHY N/A 02/28/2018   Procedure: LEFT HEART CATH AND CORONARY ANGIOGRAPHY;  Surgeon: Sherren Mocha, MD;  Location: [place] [facility];  Service: Cardiovascular;  Laterality: N/A;  . ROBOT ASSISTED LAPAROSCOPIC RADICAL PROSTATECTOMY N/A 11/19/2019   Procedure: XI ROBOTIC ASSISTED LAPAROSCOPIC RADICAL PROSTATECTOMY WITH LYMPH NODE DISSECTION;  Surgeon: Hollice Espy, MD;  Location: [facility] OR[facility];  Service: Urology;  Laterality: N/A;  . WRIST GANGLION EXCISION Left 1984    There were no  vitals filed for this visit.   Subjective Assessment - 02/11/21 1006    Subjective Pt reports 1/10 R sided neck pain with no radicular symptoms. Pt states he is also having less headaches since beginning PT and hand cramping on his R hand.    Pertinent History Marc Aguilar is a 39yoM who present to [place] for acute onset neck pain with pain across bilat shoulders and bilat arms. Pt is referred by his PCP, has had unremarkable cervical xrays, been taking both prescribed and nonprescribed analgesics, NSAID, and muscle relaxers with only mild improvement. He reports insidious onset about 1 month prior (mid March) first apparent upon lifting a table overhead a home, noting some RUE weakness. Pt has near constant pain/tightness across the top of neck/shoulders, with achiness predominantly in the Right anterior deltoid, biceps brachii, and Right proximal wrist flexors. Pt has had intermittent symptoms into the Rt pec major distribution as well as the left arm (shoulder to elbow). Pt reports when he lies in supine at home, both hands go numb. Pt also reports intermittent cramping in the right hand when gripping. Pt's symptoms are creating difficulty with putting on shirts and lifting heavy objects at home. 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 Pt denies any frank changes to gait/balance, but does reports increased cramping, soreness in bilat groin when standing at work- pt wears compression stocking bilat at baseline (knee high only). Pt denies any recently noted electrolyte abnormality, denies any diuretic therapy, denies any prior neck pain.    Limitations Lifting    How long can you sit comfortably? No    How long can you stand comfortably? No    How long can you walk comfortably? No    Diagnostic tests Unremarkable x ray study with PCP    Patient Stated Goals end paresthesias, decrease pain,  having improve UE use at work andhome    Currently in Pain? Yes    Pain Score 1     Pain Location Neck    Pain Orientation Right;Mid    Pain Descriptors / Indicators Aching;Dull    Pain Frequency Intermittent           Manual Therapy:   Pt in prone with cervical spine flexed   Grade 3 CPA mobs from C2-C7 to improve joint mobility 2x30 sec/segment    Grade 3 UPA mobs R/L C2-C7  To improve joint mobility with rotation, 1x15sec/segment        There.ex:   Seated exercises    Cervical retractions: x20. Initial PT demo and TC's/VC's for form/technique with good carryover after cuing.   Scap retractions: x20    Resisted scap retractions with RTB: 2x20 reps. Good form/technique.    Bicep curls 6 lns DB's alternating for improved form/technique. 2x12 reps.    Silver exercise ball roll outs forwards/R/L x10/direction for thoracic mobility    MWM with towel thoracic extension: x20 with VC's for form/technique      Wall push-ups: 2x12 reps. VC's and PT demo for improving form/technique with exercise.    PT Education - 02/11/21 1008    Education Details form/technique with exercise.    Person(s) Educated Patient    Methods Explanation;Demonstration;Tactile cues;Verbal cues    Comprehension Verbalized understanding;Returned demonstration            PT Short Term Goals - 02/09/21 1003      PT SHORT TERM GOAL #1   Title After 4 weeks pt will reports decreased incidence of RUE cramping phenomenon.    Time 4    Period Weeks    Status New    Target Date 02/25/21      PT SHORT TERM GOAL #2   Title After 4 weeks pt will demonstrate FOTO score increase by 10 points.    Baseline 4/20: 50    Time 4    Period Weeks    Status New    Target Date 02/25/21             PT Long Term Goals - 01/28/21 1113      PT LONG TERM GOAL #1   Title After 8 weeks pt will demonstrate improved cervical rotation ROM to >65 degrees bilaterally without provocation of UE symptoms.    Baseline At  eval: Cervical rotation ~45 degrees.    Time 8    Period Weeks    Status New    Target Date 03/25/21      PT LONG TERM GOAL #2   Title After 8 weeks pt will be able to don doff 3 shirts without pain to demonstrate ability to donn doff 1 shirt at home.    Baseline Pain with shirt donning    Time 8    Period  Weeks    Status New    Target Date 03/25/21      PT LONG TERM GOAL #3   Title After 8 weeks pt will demonstrate 5/5 strength in BUE and reports ability to resume ad lib PRN lifting of heavy objects at home.    Baseline weakness in RUE, reduced confidence    Time 8    Period Weeks    Status New    Target Date 03/25/21                 Plan - 02/11/21 1009    Clinical Impression Statement Pt progressing to therex with added resistance exercises with no increase in r sided neck pain or RUE radicular. Pt educated on seated cervical retractions/scap retractions, bicep curls, and wall push ups to add to HEP. Continued to progress improving thoracic mobility as well to improve pt's overall spinal mobility. Pt can benefit from skilled PT services to further progress mobility and RUE strength so pt can return to PLOF and complete job tasks with decreased risk of future injury.    Personal Factors and Comorbidities Profession;Time since onset of injury/illness/exacerbation    Examination-Activity Limitations Reach Overhead;Lift;Sleep    Examination-Participation Restrictions Cleaning;Occupation;Yard Work    Stability/Clinical Decision Making Unstable/Unpredictable    Rehab Potential Good    PT Frequency 2x / week    PT Duration 8 weeks    PT Treatment/Interventions ADLs/Self Care Home Management;Moist Heat;Traction;Therapeutic exercise;Therapeutic activities;Functional mobility training;Stair training;Dry needling;Manual techniques    PT Next Visit Plan Manual therapy on cervical spine (manual traction and CPA's). Address cervical AROM exercises to improve motion if pain improving.     PT Home Exercise Plan Cervical retraction 2 pillows to neutral, supine scapular retraction pec minor stretch    Consulted and Agree with Plan of Care Patient           Patient will benefit from skilled therapeutic intervention in order to improve the following deficits and impairments:  Decreased range of motion,Decreased activity tolerance,Decreased strength,Impaired flexibility,Pain,Improper body mechanics,Postural dysfunction  Visit Diagnosis: Cervicalgia  Muscle weakness (generalized)  Radiculopathy, cervical region     Problem List Patient Active Problem List   Diagnosis Date Noted  . Prostate cancer ([place]) 11/19/2019  . Osteoarthritis 11/14/2018  . Migraines 11/14/2018  . Anxiety and depression 11/14/2018  . HTN (hypertension) 11/14/2018  . Benign prostatic hyperplasia with urinary frequency 10/26/2016  . HLD (hyperlipidemia) 10/02/2015  . OSA (obstructive sleep apnea) 03/13/2015  . Seasonal allergies 03/13/2015  . GERD (gastroesophageal reflux disease) 03/13/2015  . Diabetes mellitus ([place]) 10/30/2013    Salem Caster. Fairly IV, PT, DPT Physical Therapist- [facility]  02/11/2021, 10:25 AM  [facility] [address], [place], [address] Phone: [phone]   Fax:  [phone]  Name: Marc Aguilar MRN: [id number] Date of Birth: 1961-06-25

## 2021-02-16 ENCOUNTER — Other Ambulatory Visit: Payer: Self-pay

## 2021-02-16 ENCOUNTER — Ambulatory Visit: Payer: No Typology Code available for payment source | Attending: Internal Medicine

## 2021-02-16 DIAGNOSIS — M6281 Muscle weakness (generalized): Secondary | ICD-10-CM | POA: Insufficient documentation

## 2021-02-16 DIAGNOSIS — M5412 Radiculopathy, cervical region: Secondary | ICD-10-CM | POA: Insufficient documentation

## 2021-02-16 DIAGNOSIS — M542 Cervicalgia: Secondary | ICD-10-CM | POA: Insufficient documentation

## 2021-02-16 NOTE — Therapy (Signed)
[facility] [facility] [address] [place], [address] Phone: [phone]   Fax:  [phone]  Physical Therapy Treatment  Patient Details  Name: Marc Aguilar MRN: [id number] Date of Birth: 1961-05-02 Referring Provider (PT): Webb Silversmith, NP (PCP)   Encounter Date: 02/16/2021   PT End of Session - 02/16/21 1022    Visit Number 5    Number of Visits 17    Date for PT Re-Evaluation 02/11/21    Authorization Type [employer]    Authorization Time Period 01/28/21-04/22/21    PT Start Time 0909    PT Stop Time 0945    PT Time Calculation (min) 36 min    Activity Tolerance Patient tolerated treatment well;No increased pain    Behavior During Therapy WFL for tasks assessed/performed           Past Medical History:  Diagnosis Date  . Anxiety   . Arthritis    "knees and hips" (02/28/2018)  . Cancer [facility])    prostate   . Chicken pox   . Depression   . GERD (gastroesophageal reflux disease)   . Heart murmur   . Hernia, abdominal   . High cholesterol   . History of gout   . Migraine    "probably monthly" (02/28/2018)  . OSA on CPAP   . Pneumonia    "once" (02/28/2018)  . Refusal of blood transfusions as patient is Jehovah's Witness   . Seasonal allergies   . Type II diabetes mellitus ([place])     Past Surgical History:  Procedure Laterality Date  . CARDIAC CATHETERIZATION  02/28/2018  . LEFT HEART CATH AND CORONARY ANGIOGRAPHY N/A 02/28/2018   Procedure: LEFT HEART CATH AND CORONARY ANGIOGRAPHY;  Surgeon: Sherren Mocha, MD;  Location: [place] [facility];  Service: Cardiovascular;  Laterality: N/A;  . ROBOT ASSISTED LAPAROSCOPIC RADICAL PROSTATECTOMY N/A 11/19/2019   Procedure: XI ROBOTIC ASSISTED LAPAROSCOPIC RADICAL PROSTATECTOMY WITH LYMPH NODE DISSECTION;  Surgeon: Hollice Espy, MD;  Location: [facility];  Service: Urology;  Laterality: N/A;  . WRIST GANGLION EXCISION Left 1984    There were no  vitals filed for this visit.   Subjective Assessment - 02/16/21 1018    Subjective Pt reports 3/10 NPS in R deltoid/bicep region. Pt reports intermittent cramping in L hand along median nerve distribution.    Pertinent History Marc Aguilar is a 60yoM who present to [place] for acute onset neck pain with pain across bilat shoulders and bilat arms. Pt is referred by his PCP, has had unremarkable cervical xrays, been taking both prescribed and nonprescribed analgesics, NSAID, and muscle relaxers with only mild improvement. He reports insidious onset about 1 month prior (mid March) first apparent upon lifting a table overhead a home, noting some RUE weakness. Pt has near constant pain/tightness across the top of neck/shoulders, with achiness predominantly in the Right anterior deltoid, biceps brachii, and Right proximal wrist flexors. Pt has had intermittent symptoms into the Rt pec major distribution as well as the left arm (shoulder to elbow). Pt reports when he lies in supine at home, both hands go numb. Pt also reports intermittent cramping in the right hand when gripping. Pt's symptoms are creating difficulty with putting on shirts and lifting heavy objects at home. 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 Pt denies any frank changes to  gait/balance, but does reports increased cramping, soreness in bilat groin when standing at work- pt wears compression stocking bilat at baseline (knee high only). Pt denies any recently noted electrolyte abnormality, denies any diuretic therapy, denies any prior neck pain.    Limitations Lifting    How long can you sit comfortably? No    How long can you stand comfortably? No    How long can you walk comfortably? No    Diagnostic tests Unremarkable x ray study with PCP    Patient Stated Goals end paresthesias, decrease pain, having improve UE use at work andhome     Currently in Pain? Yes    Pain Score 3     Pain Location Arm    Pain Orientation Right    Pain Descriptors / Indicators Aching;Dull          Limited in session today due to patient running late to PT.   There.ex:   Seated exercises   6 # DB bicep curls: 2x10. Reports R sided concordant pain. After VC's for scap retractions with curls, improved RUE symptoms per patient report.    4# DB scap retractions: 2x12   3# DB lateral delt raises: 2x10/LE. VC's for form/technique on RUE.    3# DB military press on RUE: PT intermittent, MinA for form/technique on elbow and wrist due to RUE weakness. Increasing concordant RUE pain.   Silver exercise ball rollouts  Forward/ bilateral lateral deviations. X10/direction for thoracic spine mobility.  Standing Nerve glides:    Radial: 2x5 reps/UE   Median/musculocutaneous: 2x5/UE. Reproduces R delt/bicep pain. Educated on how to perform at home with flossing technique with no head motion (Educated to perform UE motion only due to increased symptoms with utilizing head motions) to see if it improves symptoms.   PT Education - 02/16/21 1022    Education Details form/technique with exercise.    Person(s) Educated Patient    Methods Explanation;Demonstration;Tactile cues;Verbal cues    Comprehension Verbalized understanding;Returned demonstration            PT Short Term Goals - 02/09/21 1003      PT SHORT TERM GOAL #1   Title After 4 weeks pt will reports decreased incidence of RUE cramping phenomenon.    Time 4    Period Weeks    Status New    Target Date 02/25/21      PT SHORT TERM GOAL #2   Title After 4 weeks pt will demonstrate FOTO score increase by 10 points.    Baseline 4/20: 50    Time 4    Period Weeks    Status New    Target Date 02/25/21             PT Long Term Goals - 01/28/21 1113      PT LONG TERM GOAL #1   Title After 8 weeks pt will demonstrate improved cervical rotation ROM to >65 degrees bilaterally without  provocation of UE symptoms.    Baseline At eval: Cervical rotation ~45 degrees.    Time 8    Period Weeks    Status New    Target Date 03/25/21      PT LONG TERM GOAL #2   Title After 8 weeks pt will be able to don doff 3 shirts without pain to demonstrate ability to donn doff 1 shirt at home.    Baseline Pain with shirt donning    Time 8    Period Weeks    Status New  Target Date 03/25/21      PT LONG TERM GOAL #3   Title After 8 weeks pt will demonstrate 5/5 strength in BUE and reports ability to resume ad lib PRN lifting of heavy objects at home.    Baseline weakness in RUE, reduced confidence    Time 8    Period Weeks    Status New    Target Date 03/25/21                 Plan - 02/16/21 1023    Clinical Impression Statement Continuing to progress strength/mobility exercises of thoracic/cervical spine, and shoulder/UE strengthening. Able to reproduce concordant bicep/deltoid pain with ULTT for median nerve flossing technique. PT educated pt on how to perform median/musculcutaneous nerve glides and radial nerve glides to improve nerve mobility and symptoms. After session, pt reports improved RUE symptoms to 1/10 NPS. Pt can continue to benefit from skilled PT services to improve UE strength and spine mobility so pt can return to PLOF.    Personal Factors and Comorbidities Profession;Time since onset of injury/illness/exacerbation    Examination-Activity Limitations Reach Overhead;Lift;Sleep    Examination-Participation Restrictions Cleaning;Occupation;Yard Work    Stability/Clinical Decision Making Unstable/Unpredictable    Rehab Potential Good    PT Frequency 2x / week    PT Duration 8 weeks    PT Treatment/Interventions ADLs/Self Care Home Management;Moist Heat;Traction;Therapeutic exercise;Therapeutic activities;Functional mobility training;Stair training;Dry needling;Manual techniques    PT Next Visit Plan Manual therapy on cervical spine (manual traction and CPA's).  Address cervical AROM exercises to improve motion if pain improving.    PT Home Exercise Plan Cervical retraction 2 pillows to neutral, supine scapular retraction pec minor stretch    Consulted and Agree with Plan of Care Patient           Patient will benefit from skilled therapeutic intervention in order to improve the following deficits and impairments:  Decreased range of motion,Decreased activity tolerance,Decreased strength,Impaired flexibility,Pain,Improper body mechanics,Postural dysfunction  Visit Diagnosis: Cervicalgia  Radiculopathy, cervical region  Muscle weakness (generalized)     Problem List Patient Active Problem List   Diagnosis Date Noted  . Prostate cancer ([place]) 11/19/2019  . Osteoarthritis 11/14/2018  . Migraines 11/14/2018  . Anxiety and depression 11/14/2018  . HTN (hypertension) 11/14/2018  . Benign prostatic hyperplasia with urinary frequency 10/26/2016  . HLD (hyperlipidemia) 10/02/2015  . OSA (obstructive sleep apnea) 03/13/2015  . Seasonal allergies 03/13/2015  . GERD (gastroesophageal reflux disease) 03/13/2015  . Diabetes mellitus ([place]) 10/30/2013    Salem Caster. Fairly IV, PT, DPT Physical Therapist- [facility]  02/16/2021, 10:58 AM  [place] [facility] [address], [place], [address] Phone: [phone]   Fax:  [phone]  Name: IAM LIPSON MRN: [id number] Date of Birth: 01-27-1961

## 2021-02-18 ENCOUNTER — Other Ambulatory Visit: Payer: Self-pay

## 2021-02-18 ENCOUNTER — Ambulatory Visit: Payer: No Typology Code available for payment source

## 2021-02-18 DIAGNOSIS — M542 Cervicalgia: Secondary | ICD-10-CM | POA: Diagnosis not present

## 2021-02-18 DIAGNOSIS — M5412 Radiculopathy, cervical region: Secondary | ICD-10-CM

## 2021-02-18 DIAGNOSIS — M6281 Muscle weakness (generalized): Secondary | ICD-10-CM

## 2021-02-18 NOTE — Therapy (Signed)
[facility] [address], [place], [address] Phone: [phone]   Fax:  [phone]  Physical Therapy Treatment  Patient Details  Name: Marc Aguilar MRN: [id number] Date of Birth: 02/03/1961 Referring Provider (PT): Webb Silversmith, NP (PCP)   Encounter Date: 02/18/2021   PT End of Session - 02/18/21 1013    Visit Number 6    Number of Visits 17    Date for PT Re-Evaluation 02/11/21    Authorization Type [employer]    Authorization Time Period 01/28/21-04/22/21    PT Start Time 0901    PT Stop Time 8416    PT Time Calculation (min) 43 min    Activity Tolerance Patient tolerated treatment well;No increased pain    Behavior During Therapy WFL for tasks assessed/performed           Past Medical History:  Diagnosis Date  . Anxiety   . Arthritis    "knees and hips" (02/28/2018)  . Cancer [facility])    prostate   . Chicken pox   . Depression   . GERD (gastroesophageal reflux disease)   . Heart murmur   . Hernia, abdominal   . High cholesterol   . History of gout   . Migraine    "probably monthly" (02/28/2018)  . OSA on CPAP   . Pneumonia    "once" (02/28/2018)  . Refusal of blood transfusions as patient is Jehovah's Witness   . Seasonal allergies   . Type II diabetes mellitus ([place])     Past Surgical History:  Procedure Laterality Date  . CARDIAC CATHETERIZATION  02/28/2018  . LEFT HEART CATH AND CORONARY ANGIOGRAPHY N/A 02/28/2018   Procedure: LEFT HEART CATH AND CORONARY ANGIOGRAPHY;  Surgeon: Sherren Mocha, MD;  Location: [place];  Service: Cardiovascular;  Laterality: N/A;  . ROBOT ASSISTED LAPAROSCOPIC RADICAL PROSTATECTOMY N/A 11/19/2019   Procedure: XI ROBOTIC ASSISTED LAPAROSCOPIC RADICAL PROSTATECTOMY WITH LYMPH NODE DISSECTION;  Surgeon: Hollice Espy, MD;  Location: [facility];  Service: Urology;  Laterality: N/A;  . WRIST GANGLION EXCISION Left 1984    There were no  vitals filed for this visit.   Subjective Assessment - 02/18/21 1011    Subjective Pt reports 1/10 NPS in R bicep/deltoid today. Repors median nerve glides makes his RUE pain worse.    Pertinent History Marc Aguilar is a 19yoM who present to [place] for acute onset neck pain with pain across bilat shoulders and bilat arms. Pt is referred by his PCP, has had unremarkable cervical xrays, been taking both prescribed and nonprescribed analgesics, NSAID, and muscle relaxers with only mild improvement. He reports insidious onset about 1 month prior (mid March) first apparent upon lifting a table overhead a home, noting some RUE weakness. Pt has near constant pain/tightness across the top of neck/shoulders, with achiness predominantly in the Right anterior deltoid, biceps brachii, and Right proximal wrist flexors. Pt has had intermittent symptoms into the Rt pec major distribution as well as the left arm (shoulder to elbow). Pt reports when he lies in supine at home, both hands go numb. Pt also reports intermittent cramping in the right hand when gripping. Pt's symptoms are creating difficulty with putting on shirts and lifting heavy objects at home. 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 Pt denies any frank changes to gait/balance, but  does reports increased cramping, soreness in bilat groin when standing at work- pt wears compression stocking bilat at baseline (knee high only). Pt denies any recently noted electrolyte abnormality, denies any diuretic therapy, denies any prior neck pain.    Limitations Lifting    How long can you sit comfortably? No    How long can you stand comfortably? No    How long can you walk comfortably? No    Diagnostic tests Unremarkable x ray study with PCP    Patient Stated Goals end paresthesias, decrease pain, having improve UE use at work andhome    Currently in Pain?  Yes    Pain Score 1     Pain Location Arm    Pain Orientation Right    Pain Descriptors / Indicators Aching;Dull          There.ex:  Seated exercises:    Median nerve flossing on RUE: Mod VC/TC's for sequencing. Reports increased pain in RUE so discontinued even after adapting form. X8.   Bicep curls and hammer curls (neutral forearm grip):     6 # RUE: 2x12/grip     10# LUE: 2x12/grip    Lateral delt raises: 3# DB's in BUE's. 2x10. Decreased ROM on RUE > LUE due to reported weakness in shoulder.    RUE military press with 3# DB: x8. PT spotting on wrsit and elbow due to weakness. Reports of increased R shoulder pain so discontinued.    R/L resisted RTC with RTB. Towel b/t torso and elbow: 2x20/direction. Noticeable weakness with R shoulder ER > L shoulder ER. Pt given red TB and green TB and how to safely anchor and perform at home for HEP. Plausible shoulder pain with overhead shoulder exercises is due to decreased R shoulder stability from RTC weakness 2/2 radiculopathy.   Standing exercises:    Radial nerve flossing on RUE: Improvement in aggravated symptoms caused by the median nerve flossing. Mod VC's/TC's to improve sequencing UE and head motions. X10.    Wall pushups:: x10. Reports increase in R shoulder pain as well so discontinued.    Machine exercises at [employer]:    Standing rows/scap retractions: 25 #, 2x12 reps. Min VC's/TC's for improved form/technique.    Lat pull downs: 2x12, 55 #. Good form/technique and no RUE/shoulder pain reported.    PT Education - 02/18/21 1012    Education Details form/technique with exercise.    Person(s) Educated Patient    Methods Explanation;Demonstration;Tactile cues;Verbal cues    Comprehension Verbalized understanding;Returned demonstration            PT Short Term Goals - 02/09/21 1003      PT SHORT TERM GOAL #1   Title After 4 weeks pt will reports decreased incidence of RUE cramping phenomenon.    Time 4    Period Weeks     Status New    Target Date 02/25/21      PT SHORT TERM GOAL #2   Title After 4 weeks pt will demonstrate FOTO score increase by 10 points.    Baseline 4/20: 50    Time 4    Period Weeks    Status New    Target Date 02/25/21             PT Long Term Goals - 01/28/21 1113      PT LONG TERM GOAL #1   Title After 8 weeks pt will demonstrate improved cervical rotation ROM to >65 degrees bilaterally without provocation of UE symptoms.  Baseline At eval: Cervical rotation ~45 degrees.    Time 8    Period Weeks    Status New    Target Date 03/25/21      PT LONG TERM GOAL #2   Title After 8 weeks pt will be able to don doff 3 shirts without pain to demonstrate ability to donn doff 1 shirt at home.    Baseline Pain with shirt donning    Time 8    Period Weeks    Status New    Target Date 03/25/21      PT LONG TERM GOAL #3   Title After 8 weeks pt will demonstrate 5/5 strength in BUE and reports ability to resume ad lib PRN lifting of heavy objects at home.    Baseline weakness in RUE, reduced confidence    Time 8    Period Weeks    Status New    Target Date 03/25/21                 Plan - 02/18/21 1014    Clinical Impression Statement Pt presents with increased RUE pain with median nerve flossing. Pain and symptoms do improve with radial nerve flossing. PT educated to avoid median nerve flossing and focus on radial nerve floss. Pt continues to have difficulty and RUE shoulder pain with open chain shoulder exercises such as lateral raises and military presses. Noted R RTC musculature weakness so could have shoulder pain with overhead shoulder exercises due to decreased stability of R shoulder from RTC weakness due to his radiculopathy. Pt educated on performing rows and lat pulldowns in gym along with bicep curls to improve back, shoulder, and RUE strength due to demanding activity with job tasks. Will benefit from further skilled PT services to improve pain and return RUE  strength equal to LUE so pt can return to PLOF.    Personal Factors and Comorbidities Profession;Time since onset of injury/illness/exacerbation    Examination-Activity Limitations Reach Overhead;Lift;Sleep    Examination-Participation Restrictions Cleaning;Occupation;Yard Work    Stability/Clinical Decision Making Unstable/Unpredictable    Clinical Decision Making Moderate    Rehab Potential Good    PT Frequency 2x / week    PT Duration 8 weeks    PT Treatment/Interventions ADLs/Self Care Home Management;Moist Heat;Traction;Therapeutic exercise;Therapeutic activities;Functional mobility training;Stair training;Dry needling;Manual techniques    PT Next Visit Plan Continue R RTC and general RUE, shoulder, and back strengthening.    PT Home Exercise Plan Cervical retraction in sitting, pec minor stretch. Resisted R/L RTC ER/IR, seated scap retractions, lat pulldown.    Consulted and Agree with Plan of Care Patient           Patient will benefit from skilled therapeutic intervention in order to improve the following deficits and impairments:  Decreased range of motion,Decreased activity tolerance,Decreased strength,Impaired flexibility,Pain,Improper body mechanics,Postural dysfunction  Visit Diagnosis: Cervicalgia  Radiculopathy, cervical region  Muscle weakness (generalized)     Problem List Patient Active Problem List   Diagnosis Date Noted  . Prostate cancer ([place]) 11/19/2019  . Osteoarthritis 11/14/2018  . Migraines 11/14/2018  . Anxiety and depression 11/14/2018  . HTN (hypertension) 11/14/2018  . Benign prostatic hyperplasia with urinary frequency 10/26/2016  . HLD (hyperlipidemia) 10/02/2015  . OSA (obstructive sleep apnea) 03/13/2015  . Seasonal allergies 03/13/2015  . GERD (gastroesophageal reflux disease) 03/13/2015  . Diabetes mellitus ([place]) 10/30/2013    Salem Caster. Fairly IV, PT, DPT Physical Therapist- [facility]  02/18/2021,  10:41  AM  [facility] [address], [place], [address] Phone: [phone]   Fax:  [phone]  Name: Marc Aguilar MRN: [id number] Date of Birth: 1961-10-06

## 2021-02-23 ENCOUNTER — Ambulatory Visit: Payer: No Typology Code available for payment source

## 2021-02-25 ENCOUNTER — Ambulatory Visit: Payer: No Typology Code available for payment source

## 2021-03-02 ENCOUNTER — Ambulatory Visit: Payer: No Typology Code available for payment source

## 2021-03-04 ENCOUNTER — Other Ambulatory Visit: Payer: Self-pay

## 2021-03-04 ENCOUNTER — Ambulatory Visit: Payer: No Typology Code available for payment source

## 2021-03-04 DIAGNOSIS — M542 Cervicalgia: Secondary | ICD-10-CM

## 2021-03-04 DIAGNOSIS — M5412 Radiculopathy, cervical region: Secondary | ICD-10-CM

## 2021-03-04 DIAGNOSIS — M6281 Muscle weakness (generalized): Secondary | ICD-10-CM

## 2021-03-04 NOTE — Therapy (Signed)
[facility] [address], [place], [address] Phone: [phone]   Fax:  [phone]  Physical Therapy Treatment  Patient Details  Name: Marc Aguilar MRN: [id number] Date of Birth: [birth year] Referring Provider (PT): Webb Silversmith, NP (PCP)   Encounter Date: 03/04/2021   PT End of Session - 03/04/21 0915    Visit Number 7    Number of Visits 17    Date for PT Re-Evaluation 02/11/21    Authorization Type [employer]    Authorization Time Period 01/28/21-04/22/21    PT Start Time 0900    PT Stop Time 0943    PT Time Calculation (min) 43 min    Activity Tolerance Patient tolerated treatment well;No increased pain    Behavior During Therapy WFL for tasks assessed/performed           Past Medical History:  Diagnosis Date  . Anxiety   . Arthritis    "knees and hips" (02/28/2018)  . Cancer [facility])    prostate   . Chicken pox   . Depression   . GERD (gastroesophageal reflux disease)   . Heart murmur   . Hernia, abdominal   . High cholesterol   . History of gout   . Migraine    "probably monthly" (02/28/2018)  . OSA on CPAP   . Pneumonia    "once" (02/28/2018)  . Refusal of blood transfusions as patient is Jehovah's Witness   . Seasonal allergies   . Type II diabetes mellitus ([place])     Past Surgical History:  Procedure Laterality Date  . CARDIAC CATHETERIZATION  02/28/2018  . LEFT HEART CATH AND CORONARY ANGIOGRAPHY N/A 02/28/2018   Procedure: LEFT HEART CATH AND CORONARY ANGIOGRAPHY;  Surgeon: Sherren Mocha, MD;  Location: [place] [facility];  Service: Cardiovascular;  Laterality: N/A;  . ROBOT ASSISTED LAPAROSCOPIC RADICAL PROSTATECTOMY N/A 11/19/2019   Procedure: XI ROBOTIC ASSISTED LAPAROSCOPIC RADICAL PROSTATECTOMY WITH LYMPH NODE DISSECTION;  Surgeon: Hollice Espy, MD;  Location: [facility];  Service: Urology;  Laterality: N/A;  . WRIST GANGLION EXCISION Left 1984    There were no  vitals filed for this visit.   Subjective Assessment - 03/04/21 0901    Subjective Pt reports improvements in R neck and UE pain overall. Describes some N/T in bilateral fingers/hands in ulnar nerve distribution. Pt reports pain < 1 out of 10 prior to session.    Pertinent History Marc Aguilar is a 70yoM who present to [place] for acute onset neck pain with pain across bilat shoulders and bilat arms. Pt is referred by his PCP, has had unremarkable cervical xrays, been taking both prescribed and nonprescribed analgesics, NSAID, and muscle relaxers with only mild improvement. He reports insidious onset about 1 month prior (mid March) first apparent upon lifting a table overhead a home, noting some RUE weakness. Pt has near constant pain/tightness across the top of neck/shoulders, with achiness predominantly in the Right anterior deltoid, biceps brachii, and Right proximal wrist flexors. Pt has had intermittent symptoms into the Rt pec major distribution as well as the left arm (shoulder to elbow). Pt reports when he lies in supine at home, both hands go numb. Pt also reports intermittent cramping in the right hand when gripping. Pt's symptoms are creating difficulty with putting on shirts and lifting heavy objects at home. 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 Pt denies any frank changes to gait/balance, but does reports increased cramping, soreness in bilat groin when standing at work- pt wears compression stocking bilat at baseline (knee high only). Pt denies any recently noted electrolyte abnormality, denies any diuretic therapy, denies any prior neck pain.    Limitations Lifting    How long can you sit comfortably? No    How long can you stand comfortably? No    How long can you walk comfortably? No    Diagnostic tests Unremarkable x ray study with PCP    Patient Stated Goals end  paresthesias, decrease pain, having improve UE use at work andhome    Currently in Pain? Yes    Pain Location Arm    Pain Orientation Right    Pain Descriptors / Indicators Aching;Dull           There.ex:             Seated exercises:                         Bicep curls (neutral to supinated) and hammer curls (neutral forearm grip):                                      7 # RUE: 3x12/grip                         Lateral delt raises: 3# DB's in RUE's. 4x10. Decreased ROM on RUE > LUE due to reported weakness in shoulder.                          R shoulder resisted RTC with GTB. Towel b/t torso and elbow: 2x12/direction. VC's for decreasing speed to improve quality of motion.   R forearm supported by blue bolster    Wrist extension: 2x20, 3# DB    Wrist flexion: 2x20, 3# DB    Pronation: 2x20, 4# DB    Supination: 2x20, 4# DB  Machine exercises at [facility]:                          Standing rows/scap retractions: 35#, 1x20 reps.Good  Form/technique with carryover from previous session.                          Lat pull downs: 1x20, 65#. Good form/technique and no RUE/shoulder pain reported.   PT Education - 03/04/21 0915    Education Details form/technique with exercise.    Person(s) Educated Patient    Methods Explanation;Demonstration;Tactile cues;Verbal cues    Comprehension Verbalized understanding;Returned demonstration            PT Short Term Goals - 02/09/21 1003      PT SHORT TERM GOAL #1   Title After 4 weeks pt will reports decreased incidence of RUE cramping phenomenon.    Time 4    Period Weeks    Status New    Target Date 02/25/21      PT SHORT TERM GOAL #2   Title After 4 weeks pt will demonstrate FOTO score increase by 10 points.    Baseline 4/20: 50    Time 4    Period Weeks    Status New  Target Date 02/25/21             PT Long Term Goals - 01/28/21 1113      PT LONG TERM GOAL #1   Title After 8 weeks pt will demonstrate improved cervical  rotation ROM to >65 degrees bilaterally without provocation of UE symptoms.    Baseline At eval: Cervical rotation ~45 degrees.    Time 8    Period Weeks    Status New    Target Date 03/25/21      PT LONG TERM GOAL #2   Title After 8 weeks pt will be able to don doff 3 shirts without pain to demonstrate ability to donn doff 1 shirt at home.    Baseline Pain with shirt donning    Time 8    Period Weeks    Status New    Target Date 03/25/21      PT LONG TERM GOAL #3   Title After 8 weeks pt will demonstrate 5/5 strength in BUE and reports ability to resume ad lib PRN lifting of heavy objects at home.    Baseline weakness in RUE, reduced confidence    Time 8    Period Weeks    Status New    Target Date 03/25/21                 Plan - 03/04/21 0916    Clinical Impression Statement Despite being absent from therapy for ~2 weeks, pt progressing in therapy with increased resistance on RUE with minimal increase in pain with good form/technique. Pain subsides with rest. Pt also reporting his work tasks have improved greatly with less pain in RUE with lifting and other job requirements. Pt educated on progressing RUE, shoulder, and back strength since pt is improving in pain, N/T. Pt will continue to benefit from skilled PT services to further progress cervical mobility, and strength so pt can return to PLOF.    Personal Factors and Comorbidities Profession;Time since onset of injury/illness/exacerbation    Examination-Activity Limitations Reach Overhead;Lift;Sleep    Examination-Participation Restrictions Cleaning;Occupation;Yard Work    Stability/Clinical Decision Making Unstable/Unpredictable    Rehab Potential Good    PT Frequency 2x / week    PT Duration 8 weeks    PT Treatment/Interventions ADLs/Self Care Home Management;Moist Heat;Traction;Therapeutic exercise;Therapeutic activities;Functional mobility training;Stair training;Dry needling;Manual techniques    PT Next Visit Plan  Continue R RTC and general RUE, shoulder, and back strengthening.    PT Home Exercise Plan Cervical retraction in sitting, pec minor stretch. Resisted R/L RTC ER/IR, seated scap retractions, lat pulldown.    Consulted and Agree with Plan of Care Patient           Patient will benefit from skilled therapeutic intervention in order to improve the following deficits and impairments:  Decreased range of motion,Decreased activity tolerance,Decreased strength,Impaired flexibility,Pain,Improper body mechanics,Postural dysfunction  Visit Diagnosis: Cervicalgia  Radiculopathy, cervical region  Muscle weakness (generalized)     Problem List Patient Active Problem List   Diagnosis Date Noted  . Prostate cancer ([place]) 11/19/2019  . Osteoarthritis 11/14/2018  . Migraines 11/14/2018  . Anxiety and depression 11/14/2018  . HTN (hypertension) 11/14/2018  . Benign prostatic hyperplasia with urinary frequency 10/26/2016  . HLD (hyperlipidemia) 10/02/2015  . OSA (obstructive sleep apnea) 03/13/2015  . Seasonal allergies 03/13/2015  . GERD (gastroesophageal reflux disease) 03/13/2015  . Diabetes mellitus ([place]) 10/30/2013    Salem Caster. Fairly IV, PT, DPT Physical Therapist- [facility]  03/04/2021, 9:44 AM  [facility] [address], [place], [address] Phone: [phone]   Fax:  [phone]  Name: Marc Aguilar MRN: [id number] Date of Birth: 09/24/1961

## 2021-03-07 ENCOUNTER — Other Ambulatory Visit: Payer: Self-pay | Admitting: Urology

## 2021-03-08 ENCOUNTER — Ambulatory Visit
Admission: EM | Admit: 2021-03-08 | Discharge: 2021-03-08 | Disposition: A | Discharge status: Home / Self Care | Payer: No Typology Code available for payment source | Attending: Nurse Practitioner | Admitting: Nurse Practitioner

## 2021-03-08 ENCOUNTER — Encounter: Payer: Self-pay | Admitting: *Deleted

## 2021-03-08 ENCOUNTER — Other Ambulatory Visit: Payer: Self-pay

## 2021-03-08 ENCOUNTER — Telehealth: Payer: No Typology Code available for payment source | Admitting: Family

## 2021-03-08 DIAGNOSIS — R197 Diarrhea, unspecified: Secondary | ICD-10-CM

## 2021-03-08 DIAGNOSIS — R112 Nausea with vomiting, unspecified: Secondary | ICD-10-CM

## 2021-03-08 HISTORY — DX: Cervicalgia: M54.2

## 2021-03-08 MED ORDER — ONDANSETRON 4 MG PO TBDP
4.0000 mg | ORAL_TABLET | Freq: Once | ORAL | Status: AC
  Administered 2021-03-08: 4 mg via ORAL

## 2021-03-08 MED ORDER — ONDANSETRON HCL 4 MG PO TABS
4.0000 mg | ORAL_TABLET | Freq: Three times a day (TID) | ORAL | 0 refills | Status: DC | PRN
  Filled 2021-03-08: qty 20, 7d supply, fill #0

## 2021-03-08 MED ORDER — METRONIDAZOLE 500 MG PO TABS
500.0000 mg | ORAL_TABLET | Freq: Two times a day (BID) | ORAL | 0 refills | Status: DC
  Filled 2021-03-08: qty 14, 7d supply, fill #0

## 2021-03-08 NOTE — ED Provider Notes (Signed)
[facility]    CSN: [id number] Arrival date & time: 03/08/21  0858      History   Chief Complaint Chief Complaint  Patient presents with  . Abdominal Pain    HPI Marc Aguilar is a 60 y.o. male.   Subjective:   Marc Aguilar is a 60 y.o. male who presents for evaluation of diarrhea. Symptoms have been ongoing for the past 2 weeks ago. Diarrhea is occurring approximately 1-2 times per day. Patient describes diarrhea as watery. Diarrhea has been associated with nausea and abdominal pain described as cramping. He also endorses anorexia but is able to drink without problems. He denies any fevers, household contacts with similar illness, recent antibiotic therapy or suspicious food/drink. Patient denies blood in stool, recent travel, significant abdominal pain, unintentional weight loss or dysuria.   Patient underwent a virtual appointment with his PCP earlier for the same.  He was prescribed Zofran but was advised to report to the ED/urgent care for further evaluation.  Patient's last colonoscopy was about 7 years ago.  Patient reports that he is due again in 2025.  The following portions of the patient's history were reviewed and updated as appropriate: allergies, current medications, past family history, past medical history, past social history, past surgical history and problem list.        Past Medical History:  Diagnosis Date  . Anxiety   . Arthritis    "knees and hips" (02/28/2018)  . Cancer [facility])    prostate   . Chicken pox   . Depression   . GERD (gastroesophageal reflux disease)   . Heart murmur   . Hernia, abdominal   . High cholesterol   . History of gout   . Migraine    "probably monthly" (02/28/2018)  . Neck pain   . OSA on CPAP   . Pneumonia    "once" (02/28/2018)  . Refusal of blood transfusions as patient is Jehovah's Witness   . Seasonal allergies   . Type II diabetes mellitus [facility])     Patient Active Problem List   Diagnosis Date  Noted  . Prostate cancer ([place]) 11/19/2019  . Osteoarthritis 11/14/2018  . Migraines 11/14/2018  . Anxiety and depression 11/14/2018  . HTN (hypertension) 11/14/2018  . Benign prostatic hyperplasia with urinary frequency 10/26/2016  . HLD (hyperlipidemia) 10/02/2015  . OSA (obstructive sleep apnea) 03/13/2015  . Seasonal allergies 03/13/2015  . GERD (gastroesophageal reflux disease) 03/13/2015  . Diabetes mellitus ([place]) 10/30/2013    Past Surgical History:  Procedure Laterality Date  . CARDIAC CATHETERIZATION  02/28/2018  . LEFT HEART CATH AND CORONARY ANGIOGRAPHY N/A 02/28/2018   Procedure: LEFT HEART CATH AND CORONARY ANGIOGRAPHY;  Surgeon: Sherren Mocha, MD;  Location: [facility];  Service: Cardiovascular;  Laterality: N/A;  . ROBOT ASSISTED LAPAROSCOPIC RADICAL PROSTATECTOMY N/A 11/19/2019   Procedure: XI ROBOTIC ASSISTED LAPAROSCOPIC RADICAL PROSTATECTOMY WITH LYMPH NODE DISSECTION;  Surgeon: Hollice Espy, MD;  Location: [facility];  Service: Urology;  Laterality: N/A;  . WRIST GANGLION EXCISION Left 1984       Home Medications    Prior to Admission medications   Medication Sig Start Date End Date Taking? Authorizing Provider  amLODipine (NORVASC) 5 MG tablet TAKE 1 TABLET (5 MG TOTAL) BY MOUTH DAILY 11/27/20 11/27/21 Yes Martinique, Peter M, MD  aspirin EC 81 MG tablet Take 81 mg by mouth daily.   Yes [provider]  atorvastatin (LIPITOR) 20 MG tablet Take 1 tablet (20 mg  total) by mouth daily. 01/21/21  Yes Baity, Coralie Keens, NP  buPROPion (WELLBUTRIN XL) 150 MG 24 hr tablet TAKE 1 TABLET (150 MG TOTAL) BY MOUTH DAILY. 07/30/20 07/30/21 Yes Baity, Coralie Keens, NP  citalopram (CELEXA) 40 MG tablet TAKE 1 TABLET (40 MG TOTAL) BY MOUTH DAILY. 01/01/21 01/01/22 Yes Baity, Coralie Keens, NP  metFORMIN (GLUCOPHAGE) 1000 MG tablet Take 1 tablet (1,000 mg total) by mouth 2 (two) times daily with a meal. 04/30/20  Yes Baity, Coralie Keens, NP  metroNIDAZOLE (FLAGYL) 500 MG tablet Take 1  tablet (500 mg total) by mouth 2 (two) times daily. 03/08/21  Yes Enrique Sack, FNP  Multiple Vitamins-Minerals (MULTIVITAMIN WITH MINERALS) tablet Take 1 tablet by mouth daily.   Yes [provider]  omeprazole (PRILOSEC) 20 MG capsule TAKE 1 CAPSULE (20 MG TOTAL) BY MOUTH DAILY. 09/30/20 09/30/21 Yes Baity, Coralie Keens, NP  albuterol (VENTOLIN HFA) 108 (90 Base) MCG/ACT inhaler Inhale 2 puffs into the lungs every 6 (six) hours as needed for wheezing or shortness of breath. 03/26/19   [name], NP  Alcohol Swabs (ALCOHOL PREP) PADS 1 each by Does not apply route 2 (two) times daily. 02/01/17   [name], NP  butalbital-acetaminophen-caffeine (FIORICET) [phone] MG tablet TAKE 1-2 TABLETS BY MOUTH EVERY 6 (SIX) HOURS AS NEEDED FOR HEADACHE. 11/27/20 11/27/21  [name], NP  cyclobenzaprine (FLEXERIL) 10 MG tablet TAKE 1 TABLET (10 MG TOTAL) BY MOUTH 3 (THREE) TIMES DAILY AS NEEDED FOR MUSCLE SPASMS. 01/01/21 01/01/22  [name], NP  hydrOXYzine (ATARAX/VISTARIL) 25 MG tablet TAKE 1 TABLET (25 MG TOTAL) BY MOUTH AT BEDTIME AS NEEDED. Patient taking differently: Take 25 mg by mouth at bedtime as needed (sleep). 04/30/20 04/30/21  [name], NP  Lancets MISC 1 each by Does not apply route in the morning and at bedtime. 02/06/20   [name], NP  ondansetron (ZOFRAN) 4 MG tablet Take 1 tablet (4 mg total) by mouth every 8 (eight) hours as needed for nausea or vomiting. 03/08/21   Sharion Balloon, FNP    Family History Family History  Problem Relation Age of Onset  . Diabetes Mother   . Hyperlipidemia Mother   . Bladder Cancer Father   . Liver cancer Sister   . Diabetes Sister   . Diabetes Maternal Aunt   . Arthritis Maternal Grandmother   . Diabetes Maternal Grandmother   . Arthritis Maternal Grandfather   . Arthritis Paternal Grandmother   . Diabetes Brother   . Heart disease Brother   . Colon cancer Neg Hx     Social History Social History    Tobacco Use  . Smoking status: Former Smoker    Packs/day: 1.00    Years: 27.00    Pack years: 27.00    Types: Cigarettes    Quit date: 10/18/2009    Years since quitting: 11.3  . Smokeless tobacco: Former Systems developer    Types: [place] date: 10/18/1994  Vaping Use  . Vaping Use: Never used  Substance Use Topics  . Alcohol use: Yes    Alcohol/week: 12.0 standard drinks    Types: 12 Cans of beer per week  . Drug use: Never     Allergies   Tape   Review of Systems Review of Systems  Constitutional: Positive for appetite change. Negative for fever.  Gastrointestinal: Positive for abdominal pain, diarrhea and nausea. Negative for abdominal distention, anal bleeding, blood in stool, constipation, rectal  pain and vomiting.  Genitourinary: Negative for dysuria.  All other systems reviewed and are negative.    Physical Exam Triage Vital Signs ED Triage Vitals  Enc Vitals Group     BP 03/08/21 1023 136/87     Pulse Rate 03/08/21 1023 84     Resp 03/08/21 1023 20     Temp 03/08/21 1023 98 F (36.7 C)     Temp Source 03/08/21 1023 Oral     SpO2 03/08/21 1023 95 %     Weight --      Height --      Head Circumference --      Peak Flow --      Pain Score 03/08/21 1028 2     Pain Loc --      Pain Edu? --      Excl. in [place]? --    No data found.  Updated Vital Signs BP 136/87   Pulse 84   Temp 98 F (36.7 C) (Oral)   Resp 20   SpO2 95%   Visual Acuity Right Eye Distance:   Left Eye Distance:   Bilateral Distance:    Right Eye Near:   Left Eye Near:    Bilateral Near:     Physical Exam Vitals reviewed.  Constitutional:      General: He is not in acute distress.    Appearance: He is well-developed and normal weight. He is not ill-appearing, toxic-appearing or diaphoretic.  HENT:     Head: Normocephalic.  Cardiovascular:     Rate and Rhythm: Normal rate.  Pulmonary:     Effort: Pulmonary effort is normal.  Abdominal:     General: Bowel sounds are normal.  There is no distension.     Palpations: Abdomen is soft.     Tenderness: There is no abdominal tenderness. There is no right CVA tenderness or left CVA tenderness.  Skin:    General: Skin is warm and dry.  Neurological:     General: No focal deficit present.     Mental Status: He is alert and oriented to person, place, and time.  Psychiatric:        Mood and Affect: Mood normal.        Behavior: Behavior normal.      UC Treatments / Results  Labs (all labs ordered are listed, but only abnormal results are displayed) Labs Reviewed - No data to display  EKG   Radiology No results found.  Procedures Procedures (including critical care time)  Medications Ordered in UC Medications  ondansetron (ZOFRAN-ODT) disintegrating tablet 4 mg (4 mg Oral Given 03/08/21 1035)    Initial Impression / Assessment and Plan / UC Course  I have reviewed the triage vital signs and the nursing notes.  Pertinent labs & imaging results that were available during my care of the patient were reviewed by me and considered in my medical decision making (see chart for details).    60 year old male presenting with a 2-week history of diarrhea, abdominal cramping and anorexia.  No fevers, household contacts with similar illness, recent antibiotic therapy, recent travel, suspicious food or drink, significant abdominal pain, unintentional weight loss or dysuria.  Patient is alert and orient x3.  Nontoxic-appearing.  Afebrile.  Vital signs stable.  Nonfocal abdominal exam noted.  Patient unable to provide stool sample in clinic.  Will treat empirically with Flagyl.  Patient also advised to continue Zofran that was prescribed by his PCP.  Clear liquids/bland diet.  He is  to call his GI tomorrow to schedule follow-up.  Discussed indications for immediate ED follow-up.  Patient verbalized understanding and agreeable.  Today's evaluation has revealed no signs of a dangerous process. Discussed diagnosis with patient  and/or guardian. Patient and/or guardian aware of their diagnosis, possible red flag symptoms to watch out for and need for close follow up. Patient and/or guardian understands verbal and written discharge instructions. Patient and/or guardian comfortable with plan and disposition.  Patient and/or guardian has a clear mental status at this time, good insight into illness (after discussion and teaching) and has clear judgment to make decisions regarding their care  This care was provided during an unprecedented National Emergency due to the Novel Coronavirus (COVID-19) pandemic. COVID-19 infections and transmission risks place heavy strains on healthcare resources.  As this pandemic evolves, our facility, providers, and staff strive to respond fluidly, to remain operational, and to provide care relative to available resources and information. Outcomes are unpredictable and treatments are without well-defined guidelines. Further, the impact of COVID-19 on all aspects of urgent care, including the impact to patients seeking care for reasons other than COVID-19, is unavoidable during this national emergency. At this time of the global pandemic, management of patients has significantly changed, even for non-COVID positive patients given high local and regional COVID volumes at this time requiring high healthcare system and resource utilization. The standard of care for management of both COVID suspected and non-COVID suspected patients continues to change rapidly at the local, regional, national, and global levels. This patient was worked up and treated to the best available but ever changing evidence and resources available at this current time.   Documentation was completed with the aid of voice recognition software. Transcription may contain typographical errors.  Final Clinical Impressions(s) / UC Diagnoses   Final diagnoses:  Diarrhea of presumed infectious origin     Discharge Instructions      1. Take medications as prescribed 2. You can use the Zofran provided by your doctor earlier as needed for nausea 3. Drink plenty of fluids such as water, ice chips, diluted fruit juice or low-calorie sports drinks. 4. Avoid drinking fluids that have a lot of sugar or caffeine in them. 5. Eat bland, easy-to-digest foods in small amounts as you are able. These foods include bananas, applesauce, rice, low-fat (lean) meats, toast, crackers. 6. Avoid alcohol, spicy or fatty foods. 7. Go to the ED immediately for any symptoms that we talked about today  8. Call your GI doctor in the morning to arrange follow-up and possible colonoscopy    ED Prescriptions    Medication Sig Dispense Auth. Provider   metroNIDAZOLE (FLAGYL) 500 MG tablet Take 1 tablet (500 mg total) by mouth 2 (two) times daily. 14 tablet Enrique Sack, FNP     PDMP not reviewed this encounter.   Enrique Sack, Chaun[place]y 03/08/21 1249

## 2021-03-08 NOTE — Progress Notes (Signed)
We are sorry that you are not feeling well. Here is how we plan to help!  Based on what you have shared with me it looks like you have a Virus that is irritating your GI tract.  Vomiting is the forceful emptying of a portion of the stomach's content through the mouth.  Although nausea and vomiting can make you feel miserable, it's important to remember that these are not diseases, but rather symptoms of an underlying illness.  When we treat short term symptoms, we always caution that any symptoms that persist should be fully evaluated in a medical office.  I have prescribed a medication that will help alleviate your symptoms and allow you to stay hydrated:  Zofran 4 mg 1 tablet every 8 hours as needed for nausea and vomiting and you can have Imodium for your diarrhea.   I recommend you being seen face to face today or tomorrow since your symptoms have been on going for a week. You need to make sure you are staying hydatated.   HOME CARE:  Drink clear liquids.  This is very important! Dehydration (the lack of fluid) can lead to a serious complication.  Start off with 1 tablespoon every 5 minutes for 8 hours.  You may begin eating bland foods after 8 hours without vomiting.  Start with saltine crackers, white bread, rice, mashed potatoes, applesauce.  After 48 hours on a bland diet, you may resume a normal diet.  Try to go to sleep.  Sleep often empties the stomach and relieves the need to vomit.  GET HELP RIGHT AWAY IF:   Your symptoms do not improve or worsen within 2 days after treatment.  You have a fever for over 3 days.  You cannot keep down fluids after trying the medication.  MAKE SURE YOU:   Understand these instructions.  Will watch your condition.  Will get help right away if you are not doing well or get worse.   Thank you for choosing an e-visit. Your e-visit answers were reviewed by a board certified advanced clinical practitioner to complete your personal care  plan. Depending upon the condition, your plan could have included both over the counter or prescription medications. Please review your pharmacy choice. Be sure that the pharmacy you have chosen is open so that you can pick up your prescription now.  If there is a problem you may message your provider in [place] to have the prescription routed to another pharmacy. Your safety is important to [place]. If you have drug allergies check your prescription carefully.  For the next 24 hours, you can use MyChart to ask questions about today's visit, request a non-urgent call back, or ask for a work or school excuse from your e-visit provider. You will get an e-mail in the next two days asking about your experience. I hope that your e-visit has been valuable and will speed your recovery.  Approximately 5 minutes was spent documenting and reviewing patient's chart.

## 2021-03-08 NOTE — Discharge Instructions (Signed)
Take medications as prescribed You can use the Zofran provided by your doctor earlier as needed for nausea Drink plenty of fluids such as water, ice chips, diluted fruit juice or low-calorie sports drinks. Avoid drinking fluids that have a lot of sugar or caffeine in them. Eat bland, easy-to-digest foods in small amounts as you are able. These foods include bananas, applesauce, rice, low-fat (lean) meats, toast, crackers. Avoid alcohol, spicy or fatty foods. Go to the ED immediately for any symptoms that we talked about today  Call your GI doctor in the morning to arrange follow-up and possible colonoscopy

## 2021-03-08 NOTE — ED Notes (Signed)
Pt unable to provide stool sample at this time

## 2021-03-08 NOTE — ED Triage Notes (Signed)
Reports intermittent abd cramping with watery diarrhea approx 2 wks ago; states was unable to eat x approx 4 days because "nothing settled".  Continues with intermittent cramping and approx 2 episodes diarrhea per day.  C/O nausea, but denies vomiting. Denies fevers. States had e-visit this AM -- was prescribed Zofran, but told he should have in-person visit.

## 2021-03-09 ENCOUNTER — Other Ambulatory Visit (HOSPITAL_COMMUNITY): Payer: Self-pay

## 2021-03-09 MED ORDER — SILDENAFIL CITRATE 20 MG PO TABS
ORAL_TABLET | ORAL | 11 refills | Status: AC
  Filled 2021-03-09: qty 30, 6d supply, fill #0
  Filled 2021-03-19: qty 30, 30d supply, fill #0
  Filled 2021-05-13: qty 30, 30d supply, fill #1
  Filled 2021-07-17: qty 30, 30d supply, fill #2

## 2021-03-11 ENCOUNTER — Other Ambulatory Visit: Payer: Self-pay

## 2021-03-11 ENCOUNTER — Ambulatory Visit: Payer: No Typology Code available for payment source

## 2021-03-11 DIAGNOSIS — M5412 Radiculopathy, cervical region: Secondary | ICD-10-CM

## 2021-03-11 DIAGNOSIS — M542 Cervicalgia: Secondary | ICD-10-CM | POA: Diagnosis not present

## 2021-03-11 DIAGNOSIS — M6281 Muscle weakness (generalized): Secondary | ICD-10-CM

## 2021-03-11 NOTE — Therapy (Signed)
[facility] [address], [place], [address] Phone: [phone]   Fax:  [phone]  Physical Therapy Treatment  Patient Details  Name: Marc Aguilar MRN: [id number] Date of Birth: 1961-06-29 Referring Provider (PT): Webb Silversmith, NP (PCP)   Encounter Date: 03/11/2021   PT End of Session - 03/11/21 0920    Visit Number 8    Number of Visits 17    Date for PT Re-Evaluation 02/11/21    Authorization Type [employer]    Authorization Time Period 01/28/21-04/22/21    PT Start Time 0903    PT Stop Time 0945    PT Time Calculation (min) 42 min    Activity Tolerance Patient tolerated treatment well;No increased pain    Behavior During Therapy WFL for tasks assessed/performed           Past Medical History:  Diagnosis Date  . Anxiety   . Arthritis    "knees and hips" (02/28/2018)  . Cancer [facility])    prostate   . Chicken pox   . Depression   . GERD (gastroesophageal reflux disease)   . Heart murmur   . Hernia, abdominal   . High cholesterol   . History of gout   . Migraine    "probably monthly" (02/28/2018)  . Neck pain   . OSA on CPAP   . Pneumonia    "once" (02/28/2018)  . Refusal of blood transfusions as patient is Jehovah's Witness   . Seasonal allergies   . Type II diabetes mellitus ([place])     Past Surgical History:  Procedure Laterality Date  . CARDIAC CATHETERIZATION  02/28/2018  . LEFT HEART CATH AND CORONARY ANGIOGRAPHY N/A 02/28/2018   Procedure: LEFT HEART CATH AND CORONARY ANGIOGRAPHY;  Surgeon: Sherren Mocha, MD;  Location: [facility];  Service: Cardiovascular;  Laterality: N/A;  . ROBOT ASSISTED LAPAROSCOPIC RADICAL PROSTATECTOMY N/A 11/19/2019   Procedure: XI ROBOTIC ASSISTED LAPAROSCOPIC RADICAL PROSTATECTOMY WITH LYMPH NODE DISSECTION;  Surgeon: Hollice Espy, MD;  Location: [facility];  Service: Urology;  Laterality: N/A;  . WRIST GANGLION EXCISION Left 1984     There were no vitals filed for this visit.   Subjective Assessment - 03/11/21 0905    Subjective Pt reports no neck pain, and no RUE pain or N/T. Reports he continues to have some N/T in BUE's when laying on his side. After wkaing up in the mornings, his N/T subsides after "a few minutes". Pt unsure how his cervical alingment and resting positioning of his UE's are when he is sleeping.    Pertinent History Saverio Kader is a 63yoM who present to [place] for acute onset neck pain with pain across bilat shoulders and bilat arms. Pt is referred by his PCP, has had unremarkable cervical xrays, been taking both prescribed and nonprescribed analgesics, NSAID, and muscle relaxers with only mild improvement. He reports insidious onset about 1 month prior (mid March) first apparent upon lifting a table overhead a home, noting some RUE weakness. Pt has near constant pain/tightness across the top of neck/shoulders, with achiness predominantly in the Right anterior deltoid, biceps brachii, and Right proximal wrist flexors. Pt has had intermittent symptoms into the Rt pec major distribution as well as the left arm (shoulder to elbow). Pt reports when he lies in supine at home, both hands go numb. Pt also reports intermittent cramping in the right hand when gripping. Pt's symptoms are creating difficulty with putting on  shirts and lifting heavy objects at home. 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 Pt denies any frank changes to gait/balance, but does reports increased cramping, soreness in bilat groin when standing at work- pt wears compression stocking bilat at baseline (knee high only). Pt denies any recently noted electrolyte abnormality, denies any diuretic therapy, denies any prior neck pain.    Limitations Lifting    How long can you sit comfortably? No    How long can you stand comfortably? No     How long can you walk comfortably? No    Diagnostic tests Unremarkable x ray study with PCP    Patient Stated Goals end paresthesias, decrease pain, having improve UE use at work andhome    Currently in Pain? No/denies    Pain Score 0-No pain           There.ex:              First 5 min Pt assessed pt's sleeping position. On LUE, pt sleeps with elbow fully flexed and reports N/T in ulnar distribution. On R side, pt sleeps on R shoulder and slightly in a quarter turn off of R shoulder with UE straight. Pt educated on maintaining neutral cervical spine with adequate pillows and utilizing pillow under R shoulder to off load shoulder. On L side, pt educated to keep elbow in more extended positions vs flexed due to ulnar nerve tension.   Seated exercises:                         Bicep curls (neutral to supinated) and hammer curls (neutral forearm grip):                                      9 # RUE: 3x8/grip                         Lateral delt raises: 3# DB's in RUE's. 3x8 seated. Decreased ROM on RUE > LUE due to reported weakness in shoulder. Pain at 90 deg of motion, no pain after PT cuing with keeping end range of exercise b/t 45-60 degrees.                         R forearm supported by blue bolster                                     Wrist extension: 2x20, 6# DB                                     Wrist flexion: 2x20, 6# DB                                     Pronation: 2x20, 5# DB                                     Supination: 2x20,  5# DB    Pronation supination with hammer and 3# AW at end for longer lever arm. No difficulty to pt so discontinued.     Machine exercises at [employer]:                          Standing rows/scap retractions:1x20, 25#'s.  35#, 3x15 reps. Good  Form/technique with carryover from previous session.                          Lat pull downs: 1x10, 65#. Good form/technique and no RUE/shoulder pain reported. 1x10, 75#.     PT Education - 03/11/21 0920    Education  Details form/technique with exercises. Adjusting sleeping posture.    Person(s) Educated Patient    Methods Explanation;Demonstration;Tactile cues;Verbal cues    Comprehension Verbalized understanding;Returned demonstration            PT Short Term Goals - 02/09/21 1003      PT SHORT TERM GOAL #1   Title After 4 weeks pt will reports decreased incidence of RUE cramping phenomenon.    Time 4    Period Weeks    Status New    Target Date 02/25/21      PT SHORT TERM GOAL #2   Title After 4 weeks pt will demonstrate FOTO score increase by 10 points.    Baseline 4/20: 50    Time 4    Period Weeks    Status New    Target Date 02/25/21             PT Long Term Goals - 01/28/21 1113      PT LONG TERM GOAL #1   Title After 8 weeks pt will demonstrate improved cervical rotation ROM to >65 degrees bilaterally without provocation of UE symptoms.    Baseline At eval: Cervical rotation ~45 degrees.    Time 8    Period Weeks    Status New    Target Date 03/25/21      PT LONG TERM GOAL #2   Title After 8 weeks pt will be able to don doff 3 shirts without pain to demonstrate ability to donn doff 1 shirt at home.    Baseline Pain with shirt donning    Time 8    Period Weeks    Status New    Target Date 03/25/21      PT LONG TERM GOAL #3   Title After 8 weeks pt will demonstrate 5/5 strength in BUE and reports ability to resume ad lib PRN lifting of heavy objects at home.    Baseline weakness in RUE, reduced confidence    Time 8    Period Weeks    Status New    Target Date 03/25/21                 Plan - 03/11/21 6222    Clinical Impression Statement Pt's sleeping posture assessed due to reports of continuous N/T in BUE's with side sleeping. Educated pt on using pillows to offload RUE shoulder, maintain neutral cervical alignment with adequate pillows and when sleeping on L shoulder, to keep less flexion in elbow due to possible ulnar nerve tension sleeping with L elbow  in end range flexion. Educated on ulnar nerve flossing technique as well in case pt wakes up due to continuous ulnar nerve distribution N/T when sleeping on L side. Progressed RUE shoulder strenghtening with no pain  throughout session. Pt continuing to display excellent carryover with form/technique with exercise and reports no pain with working tasks. Pt will continue to benefit from skilled PT services to address nerve mobility, pain, and strength so pt can return to PLOF.    Personal Factors and Comorbidities Profession;Time since onset of injury/illness/exacerbation    Examination-Activity Limitations Reach Overhead;Lift;Sleep    Examination-Participation Restrictions Cleaning;Occupation;Yard Work    Stability/Clinical Decision Making Unstable/Unpredictable    Rehab Potential Good    PT Frequency 2x / week    PT Duration 8 weeks    PT Treatment/Interventions ADLs/Self Care Home Management;Moist Heat;Traction;Therapeutic exercise;Therapeutic activities;Functional mobility training;Stair training;Dry needling;Manual techniques    PT Next Visit Plan Continue R RTC and general RUE, shoulder, and back strengthening.    PT Home Exercise Plan Cervical retraction in sitting, pec minor stretch. Resisted R/L RTC ER/IR, seated scap retractions, lat pulldown.    Consulted and Agree with Plan of Care Patient           Patient will benefit from skilled therapeutic intervention in order to improve the following deficits and impairments:  Decreased range of motion,Decreased activity tolerance,Decreased strength,Impaired flexibility,Pain,Improper body mechanics,Postural dysfunction  Visit Diagnosis: Cervicalgia  Muscle weakness (generalized)  Radiculopathy, cervical region     Problem List Patient Active Problem List   Diagnosis Date Noted  . Prostate cancer ([place]) 11/19/2019  . Osteoarthritis 11/14/2018  . Migraines 11/14/2018  . Anxiety and depression 11/14/2018  . HTN (hypertension)  11/14/2018  . Benign prostatic hyperplasia with urinary frequency 10/26/2016  . HLD (hyperlipidemia) 10/02/2015  . OSA (obstructive sleep apnea) 03/13/2015  . Seasonal allergies 03/13/2015  . GERD (gastroesophageal reflux disease) 03/13/2015  . Diabetes mellitus ([place]) 10/30/2013    Salem Caster. Fairly IV, PT, DPT Physical Therapist- [facility]  03/11/2021, 10:09 AM  [facility] [address], [place], [address] Phone: [phone]   Fax:  [phone]  Name: ROGEN PORTE MRN: [id number] Date of Birth: November 03, 1960

## 2021-03-18 ENCOUNTER — Other Ambulatory Visit (HOSPITAL_COMMUNITY): Payer: Self-pay

## 2021-03-19 ENCOUNTER — Other Ambulatory Visit (HOSPITAL_COMMUNITY): Payer: Self-pay

## 2021-04-24 ENCOUNTER — Other Ambulatory Visit (HOSPITAL_COMMUNITY): Payer: Self-pay

## 2021-04-28 ENCOUNTER — Other Ambulatory Visit (HOSPITAL_COMMUNITY): Payer: Self-pay

## 2021-05-13 ENCOUNTER — Other Ambulatory Visit: Payer: Self-pay | Admitting: Internal Medicine

## 2021-05-13 ENCOUNTER — Other Ambulatory Visit (HOSPITAL_COMMUNITY): Payer: Self-pay

## 2021-05-13 MED ORDER — METFORMIN HCL 1000 MG PO TABS
1000.0000 mg | ORAL_TABLET | Freq: Two times a day (BID) | ORAL | 1 refills | Status: DC
  Filled 2021-05-13: qty 180, 90d supply, fill #0
  Filled 2021-07-17: qty 180, 90d supply, fill #1

## 2021-05-13 MED ORDER — ATORVASTATIN CALCIUM 20 MG PO TABS
20.0000 mg | ORAL_TABLET | Freq: Every day | ORAL | 0 refills | Status: DC
  Filled 2021-05-13: qty 90, 90d supply, fill #0

## 2021-05-23 ENCOUNTER — Encounter: Payer: Self-pay | Admitting: Internal Medicine

## 2021-07-16 ENCOUNTER — Other Ambulatory Visit: Payer: Self-pay

## 2021-07-16 DIAGNOSIS — C61 Malignant neoplasm of prostate: Secondary | ICD-10-CM

## 2021-07-17 ENCOUNTER — Other Ambulatory Visit: Payer: No Typology Code available for payment source

## 2021-07-17 ENCOUNTER — Other Ambulatory Visit (HOSPITAL_COMMUNITY): Payer: Self-pay

## 2021-07-17 ENCOUNTER — Other Ambulatory Visit: Payer: Self-pay | Admitting: Internal Medicine

## 2021-07-17 ENCOUNTER — Other Ambulatory Visit: Payer: Self-pay

## 2021-07-17 DIAGNOSIS — K219 Gastro-esophageal reflux disease without esophagitis: Secondary | ICD-10-CM

## 2021-07-17 DIAGNOSIS — C61 Malignant neoplasm of prostate: Secondary | ICD-10-CM

## 2021-07-17 MED FILL — Amlodipine Besylate Tab 5 MG (Base Equivalent): ORAL | 90 days supply | Qty: 90 | Fill #0 | Status: AC

## 2021-07-17 NOTE — Telephone Encounter (Signed)
I called pt and left a message for him to call us at [facility] and let us know if he is going to continue seeing Webb Silversmith there or stay at [facility].   We needed to hear from him regarding his refill requests. I will send the refill requests for the fioricet and prilosec to [facility] since he has not been seen at [facility].

## 2021-07-18 LAB — PSA: Prostate Specific Ag, Serum: <0.1 ng/mL (ref 0.0–4.0)

## 2021-07-20 ENCOUNTER — Other Ambulatory Visit (HOSPITAL_COMMUNITY): Payer: Self-pay

## 2021-07-20 MED ORDER — OMEPRAZOLE 20 MG PO CPDR
20.0000 mg | DELAYED_RELEASE_CAPSULE | Freq: Every day | ORAL | 3 refills | Status: DC
  Filled 2021-07-20: qty 30, 30d supply, fill #0

## 2021-07-20 MED ORDER — BUTALBITAL-APAP-CAFFEINE 50-325-40 MG PO TABS
1.0000 | ORAL_TABLET | Freq: Four times a day (QID) | ORAL | 0 refills | Status: DC | PRN
  Filled 2021-07-20: qty 30, 5d supply, fill #0

## 2021-07-20 NOTE — Progress Notes (Signed)
07/21/21 4:58 PM   Marc Aguilar 09/23/1961 [id number]  Referring provider:  [name], NP [address] [place],  [place] [address] Chief Complaint  Patient presents with   Prostate Cancer    1 year w/PSA prior     HPI: Marc Aguilar is a 60 y.o.male with a personal history of prostate cancer, stress urinary incontinence, an erectile dysfunction after radical prostatectomy, who returns for  year follow-up with PSA prior.   He underwent a prostate biopsy that revealed evidence of high risk prostate cancer up to Gleason 4+4 that involved 5 of 12 cores on the left and 1 low risk core in the right.   Metastatic evaluation including CT abdomen pelvis with contrast as well as bone scan negative for any evidence of metastatic disease  He is s/p robotic assisted laparoscopic radical prostatectomy on 2/1/2-2/ Surgical pathology revealed Gleason 4+3 with > 5% of tertiary pattern 5 + EFE nonfocal - SB+ margin on the left posterior lymph nodes; pT3a pN0.   PSA as of 07/17/2021 has remained undetectable.   He is doing well today, he reports no new urinary symptoms.   He still experiences some urinary leakage. If he drinks more liquids and is more active he experiences more leakage. He urinates -2 times during the night sometimes. He reports that he drinks liquids before bedtime.  Overall, he does not have much bother from this.  He reports the sildenafil has helped with his erections. He takes 5 when he wants to obtain an erection .  Sometimes, this is not as effective and he wonders why.  PSA trend:  Component Prostate Specific Ag, Serum  Latest Ref Rng & Units 0.0 - 4.0 ng/mL  08/30/2019 6.7 (H)  12/17/2019 <0.1  06/26/2020 <0.1  01/14/2021 <0.1  07/17/2021 <0.1     PMH: Past Medical History:  Diagnosis Date   Anxiety    Arthritis    "knees and hips" (02/28/2018)   Cancer ([facility])    prostate    Chicken pox    Depression    GERD (gastroesophageal reflux disease)    Heart murmur     Hernia, abdominal    High cholesterol    History of gout    Migraine    "probably monthly" (02/28/2018)   Neck pain    OSA on CPAP    Pneumonia    "once" (02/28/2018)   Refusal of blood transfusions as patient is Jehovah's Witness    Seasonal allergies    Type II diabetes mellitus ([place])     Surgical History: Past Surgical History:  Procedure Laterality Date   CARDIAC CATHETERIZATION  02/28/2018   LEFT HEART CATH AND CORONARY ANGIOGRAPHY N/A 02/28/2018   Procedure: LEFT HEART CATH AND CORONARY ANGIOGRAPHY;  Surgeon: Sherren Mocha, MD;  Location: [facility];  Service: Cardiovascular;  Laterality: N/A;   ROBOT ASSISTED LAPAROSCOPIC RADICAL PROSTATECTOMY N/A 11/19/2019   Procedure: XI ROBOTIC ASSISTED LAPAROSCOPIC RADICAL PROSTATECTOMY WITH LYMPH NODE DISSECTION;  Surgeon: Hollice Espy, MD;  Location: [facility];  Service: Urology;  Laterality: N/A;   WRIST GANGLION EXCISION Left 1984    Home Medications:  Allergies as of 07/21/2021       Reactions   Tape Rash   Paper tape-blisters        Medication List        Accurate as of July 21, 2021  4:58 PM. If you have any questions, ask your nurse or doctor.  STOP taking these medications    metroNIDAZOLE 500 MG tablet Commonly known as: FLAGYL Stopped by: Hollice Espy, MD       TAKE these medications    albuterol 108 (90 Base) MCG/ACT inhaler Commonly known as: VENTOLIN HFA Inhale 2 puffs into the lungs every 6 (six) hours as needed for wheezing or shortness of breath.   Alcohol Prep Pads 1 each by Does not apply route 2 (two) times daily.   amLODipine 5 MG tablet Commonly known as: NORVASC Take 1 tablet (5 mg total) by mouth daily.   aspirin EC 81 MG tablet Take 81 mg by mouth daily.   atorvastatin 20 MG tablet Commonly known as: LIPITOR Take 1 tablet (20 mg total) by mouth daily.   buPROPion 150 MG 24 hr tablet Commonly known as: WELLBUTRIN XL TAKE 1 TABLET (150 MG TOTAL) BY  MOUTH DAILY.   butalbital-acetaminophen-caffeine 50-325-40 MG tablet Commonly known as: FIORICET Take 1-2 tablets by mouth every 6 (six) hours as needed for headache   citalopram 40 MG tablet Commonly known as: CELEXA TAKE 1 TABLET (40 MG TOTAL) BY MOUTH DAILY.   cyclobenzaprine 10 MG tablet Commonly known as: FLEXERIL TAKE 1 TABLET (10 MG TOTAL) BY MOUTH 3 (THREE) TIMES DAILY AS NEEDED FOR MUSCLE SPASMS.   Lancets Misc 1 each by Does not apply route in the morning and at bedtime.   metFORMIN 1000 MG tablet Commonly known as: GLUCOPHAGE Take 1 tablet (1,000 mg total) by mouth 2 (two) times daily with a meal.   multivitamin with minerals tablet Take 1 tablet by mouth daily.   omeprazole 20 MG capsule Commonly known as: PRILOSEC Take 1 capsule (20 mg total) by mouth daily.   ondansetron 4 MG tablet Commonly known as: Zofran Take 1 tablet (4 mg total) by mouth every 8 (eight) hours as needed for nausea or vomiting.   sildenafil 20 MG tablet Commonly known as: REVATIO Take 1 to 5 tablets by mouth as needed prior to intercourse   tadalafil 20 MG tablet Commonly known as: CIALIS Take 1 tablet (20 mg total) by mouth daily as needed for erectile dysfunction. Started by: Hollice Espy, MD        Allergies:  Allergies  Allergen Reactions   Tape Rash    Paper tape-blisters    Family History: Family History  Problem Relation Age of Onset   Diabetes Mother    Hyperlipidemia Mother    Bladder Cancer Father    Liver cancer Sister    Diabetes Sister    Diabetes Maternal Aunt    Arthritis Maternal Grandmother    Diabetes Maternal Grandmother    Arthritis Maternal Grandfather    Arthritis Paternal Grandmother    Diabetes Brother    Heart disease Brother    Colon cancer Neg Hx     Social History:  reports that he quit smoking about 11 years ago. His smoking use included cigarettes. He has a 27.00 pack-year smoking history. He quit smokeless tobacco use about 26  years ago.  His smokeless tobacco use included chew. He reports current alcohol use of about 12.0 standard drinks per week. He reports that he does not use drugs.   Physical Exam: BP (!) 142/88   Pulse 100   Ht 5\' 11"  (1.803 m)   Wt 227 lb (103 kg)   BMI 31.66 kg/m   Constitutional:  Alert and oriented, No acute distress. HEENT: [place] AT, moist mucus membranes.  Trachea midline, no masses. Cardiovascular: No clubbing,  cyanosis, or edema. Respiratory: Normal respiratory effort, no increased work of breathing. Skin: No rashes, bruises or suspicious lesions. Neurologic: Grossly intact, no focal deficits, moving all 4 extremities. Psychiatric: Normal mood and affect.    Assessment & Plan:    Prostate cancer ([place])  - NED, PSA remains undetectable  -Given unfavorable intermediate risk disease and possible positive margin will continue PSA every 6 months .  2. Stress urinary incontinence  - Doing extremely well, continue pelvic exercises  -He has been losing weight, encouraged this behavior  3. Erectile dysfunction  -Some waning response to sildenafil -Tadalafil prescribed today as an alternative, advised to take this at least 2 hours in advance, 20 mg dose -We also discussed intracavernosal injections today.  If he fails the above, we discussed the neck steps including intracavernosal injections.  We discussed our process..  If he would like to pursue that, he will call and schedule an appointment with one of our PAs for injection teaching.  Follow-up 6 months for PSA only, 1 year with PSA with MD  Conley Rolls as a scribe for Hollice Espy, MD.,have documented all relevant documentation on the behalf of Hollice Espy, MD,as directed by  Hollice Espy, MD while in the presence of Hollice Espy, MD.  I have reviewed the above documentation for accuracy and completeness, and I agree with the above.   Hollice Espy, MD  [facility] [address] [place], [place] [address] [phone]

## 2021-07-20 NOTE — Telephone Encounter (Signed)
Will not let me close out.

## 2021-07-21 ENCOUNTER — Ambulatory Visit (INDEPENDENT_AMBULATORY_CARE_PROVIDER_SITE_OTHER): Payer: No Typology Code available for payment source | Admitting: Urology

## 2021-07-21 ENCOUNTER — Other Ambulatory Visit (HOSPITAL_COMMUNITY): Payer: Self-pay

## 2021-07-21 ENCOUNTER — Other Ambulatory Visit: Payer: Self-pay

## 2021-07-21 VITALS — BP 142/88 | HR 100 | Ht 71.0 in | Wt 227.0 lb

## 2021-07-21 DIAGNOSIS — C61 Malignant neoplasm of prostate: Secondary | ICD-10-CM

## 2021-07-21 MED ORDER — TADALAFIL 20 MG PO TABS: 20.0000 mg | ORAL_TABLET | Freq: Every day | ORAL | 6 refills | Status: DC | PRN

## 2021-07-29 ENCOUNTER — Other Ambulatory Visit (HOSPITAL_COMMUNITY): Payer: Self-pay

## 2021-07-31 ENCOUNTER — Encounter: Payer: Self-pay | Admitting: Internal Medicine

## 2021-08-13 ENCOUNTER — Encounter: Payer: Self-pay | Admitting: Internal Medicine

## 2021-08-13 ENCOUNTER — Ambulatory Visit
Admission: RE | Admit: 2021-08-13 | Discharge: 2021-08-13 | Disposition: A | Discharge status: Home / Self Care | Payer: No Typology Code available for payment source | Attending: Internal Medicine | Admitting: Internal Medicine

## 2021-08-13 ENCOUNTER — Other Ambulatory Visit: Payer: Self-pay

## 2021-08-13 ENCOUNTER — Other Ambulatory Visit (HOSPITAL_COMMUNITY): Payer: Self-pay

## 2021-08-13 ENCOUNTER — Ambulatory Visit
Admission: RE | Admit: 2021-08-13 | Discharge: 2021-08-13 | Disposition: A | Discharge status: Home / Self Care | Payer: No Typology Code available for payment source | Source: Ambulatory Visit | Attending: Internal Medicine | Admitting: Internal Medicine

## 2021-08-13 ENCOUNTER — Ambulatory Visit (INDEPENDENT_AMBULATORY_CARE_PROVIDER_SITE_OTHER): Payer: No Typology Code available for payment source | Admitting: Internal Medicine

## 2021-08-13 VITALS — BP 118/75 | HR 104 | Temp 97.7°F | Resp 17 | Ht 71.0 in | Wt 230.4 lb

## 2021-08-13 DIAGNOSIS — G43C1 Periodic headache syndromes in child or adult, intractable: Secondary | ICD-10-CM

## 2021-08-13 DIAGNOSIS — E7800 Pure hypercholesterolemia, unspecified: Secondary | ICD-10-CM | POA: Diagnosis not present

## 2021-08-13 DIAGNOSIS — K219 Gastro-esophageal reflux disease without esophagitis: Secondary | ICD-10-CM

## 2021-08-13 DIAGNOSIS — M542 Cervicalgia: Secondary | ICD-10-CM

## 2021-08-13 DIAGNOSIS — R61 Generalized hyperhidrosis: Secondary | ICD-10-CM

## 2021-08-13 DIAGNOSIS — G8929 Other chronic pain: Secondary | ICD-10-CM | POA: Diagnosis present

## 2021-08-13 DIAGNOSIS — M159 Polyosteoarthritis, unspecified: Secondary | ICD-10-CM

## 2021-08-13 DIAGNOSIS — Z6832 Body mass index (BMI) 32.0-32.9, adult: Secondary | ICD-10-CM

## 2021-08-13 DIAGNOSIS — R6881 Early satiety: Secondary | ICD-10-CM

## 2021-08-13 DIAGNOSIS — G4701 Insomnia due to medical condition: Secondary | ICD-10-CM

## 2021-08-13 DIAGNOSIS — R197 Diarrhea, unspecified: Secondary | ICD-10-CM

## 2021-08-13 DIAGNOSIS — M79602 Pain in left arm: Secondary | ICD-10-CM | POA: Diagnosis present

## 2021-08-13 DIAGNOSIS — F32A Depression, unspecified: Secondary | ICD-10-CM

## 2021-08-13 DIAGNOSIS — I10 Essential (primary) hypertension: Secondary | ICD-10-CM

## 2021-08-13 DIAGNOSIS — R232 Flushing: Secondary | ICD-10-CM | POA: Diagnosis not present

## 2021-08-13 DIAGNOSIS — E6609 Other obesity due to excess calories: Secondary | ICD-10-CM | POA: Diagnosis not present

## 2021-08-13 DIAGNOSIS — M79601 Pain in right arm: Secondary | ICD-10-CM | POA: Diagnosis present

## 2021-08-13 DIAGNOSIS — Z0001 Encounter for general adult medical examination with abnormal findings: Secondary | ICD-10-CM

## 2021-08-13 DIAGNOSIS — R0789 Other chest pain: Secondary | ICD-10-CM

## 2021-08-13 DIAGNOSIS — F419 Anxiety disorder, unspecified: Secondary | ICD-10-CM

## 2021-08-13 DIAGNOSIS — Z8546 Personal history of malignant neoplasm of prostate: Secondary | ICD-10-CM

## 2021-08-13 DIAGNOSIS — E1165 Type 2 diabetes mellitus with hyperglycemia: Secondary | ICD-10-CM | POA: Diagnosis not present

## 2021-08-13 DIAGNOSIS — R202 Paresthesia of skin: Secondary | ICD-10-CM | POA: Diagnosis present

## 2021-08-13 DIAGNOSIS — R002 Palpitations: Secondary | ICD-10-CM

## 2021-08-13 DIAGNOSIS — G4733 Obstructive sleep apnea (adult) (pediatric): Secondary | ICD-10-CM

## 2021-08-13 LAB — POCT UA - MICROALBUMIN: Microalbumin Ur, POC: 20 mg/L

## 2021-08-13 MED ORDER — TRAZODONE HCL 50 MG PO TABS
25.0000 mg | ORAL_TABLET | Freq: Every evening | ORAL | 0 refills | Status: DC | PRN
  Filled 2021-08-13: qty 30, 30d supply, fill #0

## 2021-08-13 MED ORDER — OMEPRAZOLE 20 MG PO CPDR
20.0000 mg | DELAYED_RELEASE_CAPSULE | Freq: Every day | ORAL | 0 refills | Status: DC
  Filled 2021-08-13: qty 90, 90d supply, fill #0

## 2021-08-13 MED ORDER — BUPROPION HCL ER (XL) 300 MG PO TB24
300.0000 mg | ORAL_TABLET | Freq: Every day | ORAL | 0 refills | Status: DC
  Filled 2021-08-13: qty 90, 90d supply, fill #0

## 2021-08-13 NOTE — Progress Notes (Signed)
Subjective:    Patient ID: Marc Aguilar, male    DOB: 1960/11/10, 60 y.o.   MRN: [id number]  HPI  Patient presents the clinic today for his annual exam.  He is also due to follow-up chronic conditions.   Ibuprofen 800 mg daily, switch to Meloxicam.  Migraines: Improved, last one was last month. He takes Excedrin Migraine as needed with good relief of symptoms.  OSA: He averages 4-5 per night without the use of his CPAP. Sleep study from 12/2016 reviewed.  HLD: His last LDL was 127, triglycerides 301, 12/2020. He denies myalgias on Atorvastatin. He tries to consume a low fat diet.  HTN: His BP today is 118/75. He is taking Amlodipine as prescribed. ECG from 09/2020 reviewed.  DM 2: His last A1C was 6.1%, 12/2020. His sugars range 101-134. He is taking Metformin as prescribed. He checks his feet routinely.His last eye exam was in 2021, [facility] in [place].  Anxiety and Depression: Deteriorated. He has a lot of stress at home and work. His wife's health is not the best. He is taking  Citalopram and Wellbutrin as prescribed but thinks he may need a dose adjustment. He is not currently seeing a therapist. He denies SI/HO.  Insomnia: He has difficulty falling and staying asleep. He is not taking any medications for this at this time. Sleep study from 12/2016 reviewed.  He also reports nausea, elevated heart rate, chest pain radiating into his back and jaw, and sweating. This is an intermittent issue.  He reports this feels like heartburn. He is not taking Omeprazole as prescribed.  He also reports early satiety, nausea and diarrhea. He was recently seen at [facility] in [place] for the same. He tested negative for Cdiff. He has not taken anything OTC for his symptoms.   He also reports chronic neck pain with numbness in his bilateral hands. This has been a chronic issue but seems to be getting worse. He takes Ibuprofen daily and Flexeril as needed with minimal relief of symptoms.   Flu:  07/2021 Tetanus: 10/2016 Pneumovax: 10/2018 Covid: Pfizer x 3 PSA: monitored by urology Colon Screening: 03/2014 Vision Screening: every 1-2 years Dentist: Biannually  Diet:  He does eat meat. He consumes fruits and veggies. He tries to avoid fried foods. He drinks mostly beer. Exercise: None  Review of Systems     Past Medical History:  Diagnosis Date   Anxiety    Arthritis    "knees and hips" (02/28/2018)   Cancer (HCC)    prostate    Chicken pox    Depression    GERD (gastroesophageal reflux disease)    Heart murmur    Hernia, abdominal    High cholesterol    History of gout    Migraine    "probably monthly" (02/28/2018)   Neck pain    OSA on CPAP    Pneumonia    "once" (02/28/2018)   Refusal of blood transfusions as patient is Jehovah's Witness    Seasonal allergies    Type II diabetes mellitus (HCC)     Current Outpatient Medications  Medication Sig Dispense Refill   albuterol (VENTOLIN HFA) 108 (90 Base) MCG/ACT inhaler Inhale 2 puffs into the lungs every 6 (six) hours as needed for wheezing or shortness of breath. 1 Inhaler 0   Alcohol Swabs (ALCOHOL PREP) PADS 1 each by Does not apply route 2 (two) times daily. 200 each 3   amLODipine (NORVASC) 5 MG tablet Take 1 tablet (5 mg  total) by mouth daily. 90 tablet 3   aspirin EC 81 MG tablet Take 81 mg by mouth daily.     atorvastatin (LIPITOR) 20 MG tablet Take 1 tablet (20 mg total) by mouth daily. 90 tablet 0   buPROPion (WELLBUTRIN XL) 150 MG 24 hr tablet TAKE 1 TABLET (150 MG TOTAL) BY MOUTH DAILY. 90 tablet 1   butalbital-acetaminophen-caffeine (FIORICET) 50-325-40 MG tablet Take 1-2 tablets by mouth every 6 (six) hours as needed for headache 30 tablet 0   citalopram (CELEXA) 40 MG tablet TAKE 1 TABLET (40 MG TOTAL) BY MOUTH DAILY. 90 tablet 0   cyclobenzaprine (FLEXERIL) 10 MG tablet TAKE 1 TABLET (10 MG TOTAL) BY MOUTH 3 (THREE) TIMES DAILY AS NEEDED FOR MUSCLE SPASMS. 30 tablet 0   Lancets MISC 1 each by Does  not apply route in the morning and at bedtime. 200 each 2   metFORMIN (GLUCOPHAGE) 1000 MG tablet Take 1 tablet (1,000 mg total) by mouth 2 (two) times daily with a meal. 180 tablet 1   Multiple Vitamins-Minerals (MULTIVITAMIN WITH MINERALS) tablet Take 1 tablet by mouth daily.     omeprazole (PRILOSEC) 20 MG capsule Take 1 capsule (20 mg total) by mouth daily. 30 capsule 3   ondansetron (ZOFRAN) 4 MG tablet Take 1 tablet (4 mg total) by mouth every 8 (eight) hours as needed for nausea or vomiting. 20 tablet 0   sildenafil (REVATIO) 20 MG tablet Take 1 to 5 tablets by mouth as needed prior to intercourse 30 tablet 11   tadalafil (CIALIS) 20 MG tablet Take 1 tablet (20 mg total) by mouth daily as needed for erectile dysfunction. 10 tablet 6   No current facility-administered medications for this visit.    Allergies  Allergen Reactions   Tape Rash    Paper tape-blisters    Family History  Problem Relation Age of Onset   Diabetes Mother    Hyperlipidemia Mother    Bladder Cancer Father    Liver cancer Sister    Diabetes Sister    Diabetes Maternal Aunt    Arthritis Maternal Grandmother    Diabetes Maternal Grandmother    Arthritis Maternal Grandfather    Arthritis Paternal Grandmother    Diabetes Brother    Heart disease Brother    Colon cancer Neg Hx     Social History   Socioeconomic History   Marital status: Married    Spouse name: Not on file   Number of children: 2   Years of education: Not on file   Highest education level: Not on file  Occupational History   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  Tobacco Use   Smoking status: Former    Packs/day: 1.00    Years: 27.00    Pack years: 27.00    Types: Cigarettes    Quit date: 10/18/2009    Years since quitting: 11.8   Smokeless tobacco: Former    Types: Chew    Quit date: 10/18/1994  Vaping Use   Vaping Use: Never used  Substance and Sexual Activity   Alcohol use: Yes    Alcohol/week: 12.0 standard drinks    Types: 12 Cans of  beer per week   Drug use: Never   Sexual activity: Not on file  Other Topics Concern   Not on file  Social History Narrative   Not on file   Social Determinants of Health   Financial Resource Strain: Not on file  Food Insecurity: Not on file  Transportation Needs: Not  on file  Physical Activity: Not on file  Stress: Not on file  Social Connections: Not on file  Intimate Partner Violence: Not on file     Constitutional: Pt reports intermittent headaches. Denies fever, malaise, fatigue, or abrupt weight changes.  HEENT: Denies eye pain, eye redness, ear pain, ringing in the ears, wax buildup, runny nose, nasal congestion, bloody nose, or sore throat. Respiratory: Denies difficulty breathing, shortness of breath, cough or sputum production.   Cardiovascular: Pt reports intermittent chest pain, palpitations. Denies chest pain, chest tightness, or swelling in the hands or feet.  Gastrointestinal: Pt reports nausea, diarrhea. Denies abdominal pain, bloating, constipation, or blood in the stool.  GU: Pt has a history of erectile dysfunction. Denies urgency, frequency, pain with urination, burning sensation, blood in urine, odor or discharge. Musculoskeletal: Pt reports neck pain. Denies decrease in range of motion, difficulty with gait, muscle pain or joint swelling.  Skin: Denies redness, rashes, lesions or ulcercations.  Neurological: Pt reports numbness of hands, insomnia, hot flashes. Denies dizziness, difficulty with memory, difficulty with speech or problems with balance and coordination.  Psych: Pt has a history of anxiety and depression. Denies SI/HI.  No other specific complaints in a complete review of systems (except as listed in HPI above).  Objective:   Physical Exam BP 118/75 (BP Location: Right Arm, Patient Position: Sitting, Cuff Size: Large)   Pulse (!) 104   Temp 97.7 F (36.5 C) (Temporal)   Resp 17   Ht 5\' 11"  (1.803 m)   Wt 230 lb 6.4 oz (104.5 kg)   SpO2 98%    BMI 32.13 kg/m   Wt Readings from Last 3 Encounters:  07/21/21 227 lb (103 kg)  01/01/21 241 lb (109.3 kg)  09/30/20 243 lb (110.2 kg)    General: Appears his stated age, obese, in NAD. Skin: Warm, dry and intact. No ulcerations noted. HEENT: Head: normal shape and size; Eyes: EOMs intact;  Neck:  Neck supple, trachea midline. No masses, lumps or thyromegaly present.  Cardiovascular: Tachycardic with normal rhythm. S1,S2 noted.  No murmur, rubs or gallops noted. No JVD or BLE edema. No carotid bruits noted. Pulmonary/Chest: Normal effort and positive vesicular breath sounds. No respiratory distress. No wheezes, rales or ronchi noted.  Abdomen: Soft and nontender. Normal bowel sounds.Ventral hernia noted. Liver, spleen and kidneys non palpable. Musculoskeletal:Normal flexion, extension, rotation and lateral bending of the cervical spine No bony tenderness noted over the lumbar spine. Strength 5/5 BUE/BLE. No difficulty with gait.  Neurological: Alert and oriented. Cranial nerves II-XII grossly intact. Coordination normal. Negative Phalen's. Negative Tinel's.  Psychiatric: Mood and affect normal. Behavior is normal. Judgment and thought content normal.    BMET    Component Value Date/Time   NA 137 01/01/2021 1047   K 4.1 01/01/2021 1047   CL 104 01/01/2021 1047   CO2 25 01/01/2021 1047   GLUCOSE 146 (H) 01/01/2021 1047   BUN 17 01/01/2021 1047   CREATININE 0.96 01/01/2021 1047   CALCIUM 9.6 01/01/2021 1047   GFRNONAA >60 09/26/2020 1951   GFRAA >60 11/20/2019 0445    Lipid Panel     Component Value Date/Time   CHOL 202 (H) 01/01/2021 1047   CHOL 157 10/26/2019 1203   TRIG 301.0 (H) 01/01/2021 1047   HDL 47.60 01/01/2021 1047   HDL 48 10/26/2019 1203   CHOLHDL 4 01/01/2021 1047   VLDL 60.2 (H) 01/01/2021 1047   LDLCALC 95 02/05/2020 1058   LDLCALC 90 10/26/2019 1203  CBC    Component Value Date/Time   WBC 6.7 09/26/2020 1951   RBC 5.73 09/26/2020 1951   HGB  14.9 09/26/2020 1951   HCT 46.5 09/26/2020 1951   PLT 235 09/26/2020 1951   MCV 81.2 09/26/2020 1951   MCH 26.0 09/26/2020 1951   MCHC 32.0 09/26/2020 1951   RDW 14.3 09/26/2020 1951   LYMPHSABS 3.0 09/26/2020 1951   MONOABS 0.6 09/26/2020 1951   EOSABS 0.2 09/26/2020 1951   BASOSABS 0.1 09/26/2020 1951    Hgb A1C Lab Results  Component Value Date   HGBA1C 6.1 (H) 01/01/2021            Assessment & Plan:   Preventative Health Maintenance:  Flu shot UTD Tetanus UTD Encouraged him to get his covid booster Advised him to check coverage for a Shingrix vaccine Colon screening UTD Encouraged him to consume a balanced diet and exercise regimen Advised him to see an eye doctor and dentist annually Will check CBC, CMET, TSH, Lipid, A1C, urine microalbumin and PSA today  Cervical Radiculitis:  Xray cervical spine today Encouraged daily stretching  Chest Pain, Palpitations, Hot Flashes, Excessive Sweating, Nausea, Early Satiety, Diarrhea:  ? GERD Advised him to start taking his Omeprazole as prescribed, refilled today Hold Metformin x 2 weeks to see if this is a contributing factor Will check cortisol, ACTH and TSH today Indication for ECG: chest pain Interpretation of ECG: normal rate and rhythm, no acute findings Comparison of ECG: 09/2020, no changes  RTC in 6 months, follow up chronic conditions  Webb Silversmith, NP This visit occurred during the SARS-CoV-2 public health emergency.  Safety protocols were in place, including screening questions prior to the visit, additional usage of staff PPE, and extensive cleaning of exam room while observing appropriate contact time as indicated for disinfecting solutions.

## 2021-08-17 DIAGNOSIS — G4700 Insomnia, unspecified: Secondary | ICD-10-CM | POA: Insufficient documentation

## 2021-08-17 DIAGNOSIS — E6609 Other obesity due to excess calories: Secondary | ICD-10-CM | POA: Insufficient documentation

## 2021-08-17 DIAGNOSIS — E66811 Obesity, class 1: Secondary | ICD-10-CM | POA: Insufficient documentation

## 2021-08-17 NOTE — Assessment & Plan Note (Signed)
A1C and urine microalbumin today Encouraged him to consume a low carb diet and exercise for weight loss Continue Metformin Will request copy of eye exam Encouraged routine foot exams Flu shot UTD Pneumovax UTD Encouraged him to get his covid booster

## 2021-08-17 NOTE — Assessment & Plan Note (Signed)
Controlled on Amlodipine Reinforced DASH diet and exercise for weight loss 

## 2021-08-17 NOTE — Patient Instructions (Signed)
Health Maintenance, Male Adopting a healthy lifestyle and getting preventive care are important in promoting health and wellness. Ask your health care provider about: The right schedule for you to have regular tests and exams. Things you can do on your own to prevent diseases and keep yourself healthy. What should I know about diet, weight, and exercise? Eat a healthy diet  Eat a diet that includes plenty of vegetables, fruits, low-fat dairy products, and lean protein. Do not eat a lot of foods that are high in solid fats, added sugars, or sodium. Maintain a healthy weight Body mass index (BMI) is a measurement that can be used to identify possible weight problems. It estimates body fat based on height and weight. Your health care provider can help determine your BMI and help you achieve or maintain a healthy weight. Get regular exercise Get regular exercise. This is one of the most important things you can do for your health. Most adults should: Exercise for at least 150 minutes each week. The exercise should increase your heart rate and make you sweat (moderate-intensity exercise). Do strengthening exercises at least twice a week. This is in addition to the moderate-intensity exercise. Spend less time sitting. Even light physical activity can be beneficial. Watch cholesterol and blood lipids Have your blood tested for lipids and cholesterol at 58 years of age, then have this test every 5 years. You may need to have your cholesterol levels checked more often if: Your lipid or cholesterol levels are high. You are older than 58 years of age. You are at high risk for heart disease. What should I know about cancer screening? Many types of cancers can be detected early and may often be prevented. Depending on your health history and family history, you may need to have cancer screening at various ages. This may include screening for: Colorectal cancer. Prostate cancer. Skin cancer. Lung  cancer. What should I know about heart disease, diabetes, and high blood pressure? Blood pressure and heart disease High blood pressure causes heart disease and increases the risk of stroke. This is more likely to develop in people who have high blood pressure readings, are of African descent, or are overweight. Talk with your health care provider about your target blood pressure readings. Have your blood pressure checked: Every 3-5 years if you are 18-39 years of age. Every year if you are 40 years old or older. If you are between the ages of 65 and 75 and are a current or former smoker, ask your health care provider if you should have a one-time screening for abdominal aortic aneurysm (AAA). Diabetes Have regular diabetes screenings. This checks your fasting blood sugar level. Have the screening done: Once every three years after age 45 if you are at a normal weight and have a low risk for diabetes. More often and at a younger age if you are overweight or have a high risk for diabetes. What should I know about preventing infection? Hepatitis B If you have a higher risk for hepatitis B, you should be screened for this virus. Talk with your health care provider to find out if you are at risk for hepatitis B infection. Hepatitis C Blood testing is recommended for: Everyone born from 1945 through 1965. Anyone with known risk factors for hepatitis C. Sexually transmitted infections (STIs) You should be screened each year for STIs, including gonorrhea and chlamydia, if: You are sexually active and are younger than 58 years of age. You are older than 58 years   of age and your health care provider tells you that you are at risk for this type of infection. Your sexual activity has changed since you were last screened, and you are at increased risk for chlamydia or gonorrhea. Ask your health care provider if you are at risk. Ask your health care provider about whether you are at high risk for HIV.  Your health care provider may recommend a prescription medicine to help prevent HIV infection. If you choose to take medicine to prevent HIV, you should first get tested for HIV. You should then be tested every 3 months for as long as you are taking the medicine. Follow these instructions at home: Lifestyle Do not use any products that contain nicotine or tobacco, such as cigarettes, e-cigarettes, and chewing tobacco. If you need help quitting, ask your health care provider. Do not use street drugs. Do not share needles. Ask your health care provider for help if you need support or information about quitting drugs. Alcohol use Do not drink alcohol if your health care provider tells you not to drink. If you drink alcohol: Limit how much you have to 0-2 drinks a day. Be aware of how much alcohol is in your drink. In the [place] one drink equals one 12 oz bottle of beer (355 mL), one 5 oz glass of wine (148 mL), or one 1 oz glass of hard liquor (44 mL). General instructions Schedule regular health, dental, and eye exams. Stay current with your vaccines. Tell your health care provider if: You often feel depressed. You have ever been abused or do not feel safe at home. Summary Adopting a healthy lifestyle and getting preventive care are important in promoting health and wellness. Follow your health care provider's instructions about healthy diet, exercising, and getting tested or screened for diseases. Follow your health care provider's instructions on monitoring your cholesterol and blood pressure. This information is not intended to replace advice given to you by your health care provider. Make sure you discuss any questions you have with your health care provider. Document Revised: 12/12/2020 Document Reviewed: 09/27/2018 Elsevier Patient Education  2022 [employer]vier Inc.  

## 2021-08-17 NOTE — Assessment & Plan Note (Signed)
Deter orated Increase Wellbutrin to 300 mg daily Support offered

## 2021-08-17 NOTE — Assessment & Plan Note (Signed)
Continue Excedrin Migraine as needed

## 2021-08-17 NOTE — Assessment & Plan Note (Signed)
Encouraged weight loss as this can help reduce sleep apnea symptoms Encouraged use of CPAP as this can help improve his insomnia

## 2021-08-17 NOTE — Assessment & Plan Note (Signed)
Encouraged  Diet and exercise for weight loss

## 2021-08-17 NOTE — Assessment & Plan Note (Signed)
Advised him to stop Ibuprofen as this could be contributing to his GI upset and start Meloxicam Encouraged regular stretching

## 2021-08-17 NOTE — Assessment & Plan Note (Addendum)
In remission He will continue to follow with urology

## 2021-08-17 NOTE — Assessment & Plan Note (Signed)
CMET and lipid profile today °Encouraged him to consume a low fat diet °Continue Atorvastatin °

## 2021-08-17 NOTE — Assessment & Plan Note (Signed)
Will trial Trazadone Advised him to start wearing his CPAP to see if this helps with his insomnia

## 2021-08-19 ENCOUNTER — Other Ambulatory Visit (HOSPITAL_COMMUNITY): Payer: Self-pay

## 2021-08-21 LAB — CORTISOL: Cortisol, Plasma: 9.4 ug/dL

## 2021-08-21 LAB — COMPLETE METABOLIC PANEL WITH GFR
AG Ratio: 1.8 (calc) (ref 1.0–2.5)
ALT: 15 U/L (ref 9–46)
AST: 21 U/L (ref 10–35)
Albumin: 4.4 g/dL (ref 3.6–5.1)
Alkaline phosphatase (APISO): 72 U/L (ref 35–144)
BUN/Creatinine Ratio: 23 (calc) — ABNORMAL HIGH (ref 6–22)
BUN: 29 mg/dL — ABNORMAL HIGH (ref 7–25)
CO2: 26 mmol/L (ref 20–32)
Calcium: 9.4 mg/dL (ref 8.6–10.3)
Chloride: 100 mmol/L (ref 98–110)
Creat: 1.24 mg/dL (ref 0.70–1.30)
Globulin: 2.5 g/dL (calc) (ref 1.9–3.7)
Glucose, Bld: 162 mg/dL — ABNORMAL HIGH (ref 65–139)
Potassium: 3.8 mmol/L (ref 3.5–5.3)
Sodium: 136 mmol/L (ref 135–146)
Total Bilirubin: 0.5 mg/dL (ref 0.2–1.2)
Total Protein: 6.9 g/dL (ref 6.1–8.1)
eGFR: 67 mL/min/{1.73_m2} (ref >=60)

## 2021-08-21 LAB — CBC
HCT: 43.1 % (ref 38.5–50.0)
Hemoglobin: 14 g/dL (ref 13.2–17.1)
MCH: 27 pg (ref 27.0–33.0)
MCHC: 32.5 g/dL (ref 32.0–36.0)
MCV: 83 fL (ref 80.0–100.0)
MPV: 9.8 fL (ref 7.5–12.5)
Platelets: 239 10*3/uL (ref 140–400)
RBC: 5.19 10*6/uL (ref 4.20–5.80)
RDW: 14.4 % (ref 11.0–15.0)
WBC: 4.4 10*3/uL (ref 3.8–10.8)

## 2021-08-21 LAB — LIPID PANEL
Cholesterol: 163 mg/dL (ref <200)
HDL: 49 mg/dL (ref >=40)
LDL Cholesterol (Calc): 67 mg/dL (calc)
Non-HDL Cholesterol (Calc): 114 mg/dL (calc) (ref <130)
Total CHOL/HDL Ratio: 3.3 (calc) (ref <5.0)
Triglycerides: 387 mg/dL — ABNORMAL HIGH (ref <150)

## 2021-08-21 LAB — HEMOGLOBIN A1C
Hgb A1c MFr Bld: 5.2 % of total Hgb (ref <5.7)
Mean Plasma Glucose: 103 mg/dL
eAG (mmol/L): 5.7 mmol/L

## 2021-08-21 LAB — TSH: TSH: 1.23 mIU/L (ref 0.40–4.50)

## 2021-08-21 LAB — MICROALBUMIN / CREATININE URINE RATIO
Creatinine, Urine: 119 mg/dL (ref 20–320)
Microalb, Ur: <0.2 mg/dL

## 2021-08-21 LAB — ACTH: C206 ACTH: 25 pg/mL (ref 6–50)

## 2021-08-26 ENCOUNTER — Encounter: Payer: Self-pay | Admitting: Internal Medicine

## 2021-08-27 ENCOUNTER — Other Ambulatory Visit (HOSPITAL_COMMUNITY): Payer: Self-pay

## 2021-08-27 MED ORDER — EZETIMIBE 10 MG PO TABS
10.0000 mg | ORAL_TABLET | Freq: Every day | ORAL | 0 refills | Status: DC
  Filled 2021-08-27: qty 90, 90d supply, fill #0

## 2021-09-15 ENCOUNTER — Other Ambulatory Visit: Payer: Self-pay

## 2021-09-15 ENCOUNTER — Emergency Department (HOSPITAL_COMMUNITY)
Admission: EM | Admit: 2021-09-15 | Discharge: 2021-09-16 | Disposition: A | Discharge status: Home / Self Care | Payer: No Typology Code available for payment source | Attending: Emergency Medicine | Admitting: Emergency Medicine

## 2021-09-15 DIAGNOSIS — Z79899 Other long term (current) drug therapy: Secondary | ICD-10-CM | POA: Diagnosis not present

## 2021-09-15 DIAGNOSIS — Z8546 Personal history of malignant neoplasm of prostate: Secondary | ICD-10-CM | POA: Diagnosis not present

## 2021-09-15 DIAGNOSIS — E119 Type 2 diabetes mellitus without complications: Secondary | ICD-10-CM | POA: Insufficient documentation

## 2021-09-15 DIAGNOSIS — Z87891 Personal history of nicotine dependence: Secondary | ICD-10-CM | POA: Diagnosis not present

## 2021-09-15 DIAGNOSIS — Z7982 Long term (current) use of aspirin: Secondary | ICD-10-CM | POA: Diagnosis not present

## 2021-09-15 DIAGNOSIS — Z7984 Long term (current) use of oral hypoglycemic drugs: Secondary | ICD-10-CM | POA: Insufficient documentation

## 2021-09-15 DIAGNOSIS — R040 Epistaxis: Secondary | ICD-10-CM | POA: Diagnosis present

## 2021-09-15 DIAGNOSIS — I10 Essential (primary) hypertension: Secondary | ICD-10-CM | POA: Insufficient documentation

## 2021-09-15 MED ORDER — OXYMETAZOLINE HCL 0.05 % NA SOLN
1.0000 | Freq: Once | NASAL | Status: AC
  Administered 2021-09-15: 1 via NASAL
  Filled 2021-09-15: qty 30

## 2021-09-15 NOTE — ED Provider Notes (Signed)
[facility] Provider Note  CSN: [id number] Arrival date & time: 09/15/21 2308  Chief Complaint(s) Epistaxis  HPI Marc Aguilar is a 60 y.o. male    Epistaxis Location:  R nare Severity:  Moderate Duration:  45 minutes Timing:  Constant Progression:  Unchanged Chronicity:  Recurrent Context: aspirin use   Context: not BiPAP, not bleeding disorder, not drug use, not foreign body, not home oxygen, not hypertension, not nose picking, not thrombocytopenia, not trauma and not weather change   Relieved by:  Nothing Ineffective treatments:  Applying pressure Associated symptoms: dizziness   Associated symptoms: no fever and no headaches   Dizziness:    Severity:  Mild  Past Medical History Past Medical History:  Diagnosis Date   Anxiety    Arthritis    "knees and hips" (02/28/2018)   Cancer ([place])    prostate    Chicken pox    Depression    GERD (gastroesophageal reflux disease)    Heart murmur    Hernia, abdominal    High cholesterol    History of gout    Migraine    "probably monthly" (02/28/2018)   Neck pain    OSA on CPAP    Pneumonia    "once" (02/28/2018)   Refusal of blood transfusions as patient is Jehovah's Witness    Seasonal allergies    Type II diabetes mellitus ([place])    Patient Active Problem List   Diagnosis Date Noted   Insomnia 08/17/2021   Class 1 obesity due to excess calories with serious comorbidity and body mass index (BMI) of 32.0 to 32.9 in adult 08/17/2021   History of prostate cancer 11/19/2019   Osteoarthritis 11/14/2018   Migraines 11/14/2018   Anxiety and depression 11/14/2018   HTN (hypertension) 11/14/2018   HLD (hyperlipidemia) 10/02/2015   OSA (obstructive sleep apnea) 03/13/2015   GERD (gastroesophageal reflux disease) 03/13/2015   Diabetes mellitus ([place]) 10/30/2013   Home Medication(s) Prior to Admission medications   Medication Sig Start Date End Date Taking? Authorizing Provider   albuterol (VENTOLIN HFA) 108 (90 Base) MCG/ACT inhaler Inhale 2 puffs into the lungs every 6 (six) hours as needed for wheezing or shortness of breath. 03/26/19   [name], NP  Alcohol Swabs (ALCOHOL PREP) PADS 1 each by Does not apply route 2 (two) times daily. 02/01/17   [name], NP  amLODipine (NORVASC) 5 MG tablet Take 1 tablet (5 mg total) by mouth daily. 11/27/20   Martinique, Peter M, MD  aspirin EC 81 MG tablet Take 81 mg by mouth daily.    [provider]  atorvastatin (LIPITOR) 20 MG tablet Take 1 tablet (20 mg total) by mouth daily. 05/13/21   Kennyth Arnold, FNP  buPROPion (WELLBUTRIN XL) 300 MG 24 hr tablet Take 1 tablet (300 mg total) by mouth daily. 08/13/21   [name], NP  citalopram (CELEXA) 40 MG tablet TAKE 1 TABLET (40 MG TOTAL) BY MOUTH DAILY. 01/01/21 01/01/22  [name], NP  cyclobenzaprine (FLEXERIL) 10 MG tablet TAKE 1 TABLET (10 MG TOTAL) BY MOUTH 3 (THREE) TIMES DAILY AS NEEDED FOR MUSCLE SPASMS. 01/01/21 01/01/22  [name], NP  ezetimibe (ZETIA) 10 MG tablet Take 1 tablet (10 mg total) by mouth daily. 08/27/21   [name], NP  Lancets MISC 1 each by Does not apply route in the morning and at bedtime. 02/06/20   [name], NP  metFORMIN (GLUCOPHAGE) 1000 MG  tablet Take 1 tablet (1,000 mg total) by mouth 2 (two) times daily with a meal. 05/13/21   Dutch Quint B, FNP  Multiple Vitamins-Minerals (MULTIVITAMIN WITH MINERALS) tablet Take 1 tablet by mouth daily.    [provider]  omeprazole (PRILOSEC) 20 MG capsule Take 1 capsule (20 mg total) by mouth daily. 08/13/21   [name], NP  ondansetron (ZOFRAN) 4 MG tablet Take 1 tablet (4 mg total) by mouth every 8 (eight) hours as needed for nausea or vomiting. 03/08/21   Sharion Balloon, FNP  sildenafil (REVATIO) 20 MG tablet Take 1 to 5 tablets by mouth as needed prior to intercourse 03/09/21 03/09/22  Hollice Espy, MD  tadalafil (CIALIS) 20 MG tablet Take 1 tablet (20  mg total) by mouth daily as needed for erectile dysfunction. 07/21/21   Hollice Espy, MD  traZODone (DESYREL) 50 MG tablet Take 0.5-1 tablets (25-50 mg total) by mouth at bedtime as needed for sleep. 08/13/21   [name], NP                                                                                                                                    Past Surgical History Past Surgical History:  Procedure Laterality Date   CARDIAC CATHETERIZATION  02/28/2018   LEFT HEART CATH AND CORONARY ANGIOGRAPHY N/A 02/28/2018   Procedure: LEFT HEART CATH AND CORONARY ANGIOGRAPHY;  Surgeon: Sherren Mocha, MD;  Location: [facility];  Service: Cardiovascular;  Laterality: N/A;   ROBOT ASSISTED LAPAROSCOPIC RADICAL PROSTATECTOMY N/A 11/19/2019   Procedure: XI ROBOTIC ASSISTED LAPAROSCOPIC RADICAL PROSTATECTOMY WITH LYMPH NODE DISSECTION;  Surgeon: Hollice Espy, MD;  Location: [facility];  Service: Urology;  Laterality: N/A;   WRIST GANGLION EXCISION Left 1984   Family History Family History  Problem Relation Age of Onset   Diabetes Mother    Hyperlipidemia Mother    Bladder Cancer Father    Liver cancer Sister    Diabetes Sister    Diabetes Maternal Aunt    Arthritis Maternal Grandmother    Diabetes Maternal Grandmother    Arthritis Maternal Grandfather    Arthritis Paternal Grandmother    Diabetes Brother    Heart disease Brother    Colon cancer Neg Hx     Social History Social History   Tobacco Use   Smoking status: Former    Packs/day: 1.00    Years: 27.00    Pack years: 27.00    Types: Cigarettes    Quit date: 10/18/2009    Years since quitting: 11.9   Smokeless tobacco: Former    Types: Chew    Quit date: 10/18/1994  Vaping Use   Vaping Use: Never used  Substance Use Topics   Alcohol use: Yes    Alcohol/week: 12.0 standard drinks    Types: 12 Cans of beer per week   Drug use: Never   Allergies Tape  Review of  Systems Review of Systems  Constitutional:   Negative for fever.  HENT:  Positive for nosebleeds.   Neurological:  Positive for dizziness. Negative for headaches.  All other systems are reviewed and are negative for acute change except as noted in the HPI  Physical Exam Vital Signs  I have reviewed the triage vital signs BP (!) 140/93   Pulse 75   Temp 97.6 F (36.4 C) (Oral)   Resp 18   SpO2 98%   Physical Exam Vitals reviewed.  Constitutional:      General: He is not in acute distress.    Appearance: He is well-developed. He is not diaphoretic.  HENT:     Head: Normocephalic and atraumatic.     Right Ear: External ear normal.     Left Ear: External ear normal.     Nose:     Right Nostril: Epistaxis present.     Mouth/Throat:     Mouth: Mucous membranes are moist.  Eyes:     General: No scleral icterus.    Conjunctiva/sclera: Conjunctivae normal.  Neck:     Trachea: Phonation normal.  Cardiovascular:     Rate and Rhythm: Normal rate and regular rhythm.  Pulmonary:     Effort: Pulmonary effort is normal. No respiratory distress.     Breath sounds: No stridor.  Abdominal:     General: There is no distension.  Musculoskeletal:        General: Normal range of motion.     Cervical back: Normal range of motion.  Neurological:     Mental Status: He is alert and oriented to person, place, and time.  Psychiatric:        Behavior: Behavior normal.    ED Results and Treatments Labs (all labs ordered are listed, but only abnormal results are displayed) Labs Reviewed - No data to display                                                                                                                       EKG  EKG Interpretation  Date/Time:    Ventricular Rate:    PR Interval:    QRS Duration:   QT Interval:    QTC Calculation:   R Axis:     Text Interpretation:         Radiology No results found.  Pertinent labs & imaging results that were available during my care of the patient were reviewed by me and  considered in my medical decision making (see MDM for details).  Medications Ordered in ED Medications  oxymetazoline (AFRIN) 0.05 % nasal spray 1 spray (1 spray Each Nare Given 09/15/21 2314)  Procedures .Epistaxis Management  Date/Time: 09/16/2021 1:14 AM Performed by: Fatima Blank, MD Authorized by: Fatima Blank, MD   Consent:    Consent obtained:  Verbal   Consent given by:  Patient   Risks discussed:  Bleeding, infection and nasal injury   Alternatives discussed:  Delayed treatment Anesthesia:    Anesthesia method:  None Procedure details:    Treatment site:  R anterior   Treatment method:  Anterior pack   Treatment complexity:  Limited Post-procedure details:    Assessment:  Bleeding stopped   Procedure completion:  Tolerated  (including critical care time)  Medical Decision Making / ED Course I have reviewed the nursing notes for this encounter and the patient's prior records (if available in EHR or on provided paperwork).  Marc Aguilar was evaluated in Emergency Department on 09/16/2021 for the symptoms described in the history of present illness. He was evaluated in the context of the global COVID-19 pandemic, which necessitated consideration that the patient might be at risk for infection with the SARS-CoV-2 virus that causes COVID-19. Institutional protocols and algorithms that pertain to the evaluation of patients at risk for COVID-19 are in a state of rapid change based on information released by regulatory bodies including the CDC and federal and state organizations. These policies and algorithms were followed during the patient's care in the ED.     Anterior right nare epistaxis. Controlled with anterior packing and Afrin. Monitored without recurrence. Patient reported lightheadedness. Tolerating oral  intake. Ambulated without complication.    Final Clinical Impression(s) / ED Diagnoses Final diagnoses:  Right-sided epistaxis   The patient appears reasonably screened and/or stabilized for discharge and I doubt any other medical condition or other [facility] requiring further screening, evaluation, or treatment in the ED at this time prior to discharge. Safe for discharge with strict return precautions.  Disposition: Discharge  Condition: Good   ED Discharge Orders     None         Follow Up: [name], [place] [address] [phone]  Call  as needed     This chart was dictated using voice recognition software.  Despite best efforts to proofread,  errors can occur which can change the documentation meaning.    Fatima Blank, MD 09/16/21 [phone]

## 2021-09-15 NOTE — ED Triage Notes (Signed)
Nose started slowly bleeding out of right nare. Pt states bleeding started to pick up and feeling light headed. Pressure holding to nare

## 2021-09-15 NOTE — ED Notes (Signed)
Bleeding controlled at this time. Pt is still having some dizziness.

## 2021-09-16 NOTE — ED Notes (Signed)
Pt cleaned up.

## 2021-09-16 NOTE — ED Notes (Signed)
Bleeding still controlled at this time. Discharge paper work reviewed with pt. Pt verbalized understanding. Pt back to work at this time.

## 2021-10-06 LAB — HM DIABETES EYE EXAM

## 2021-12-02 ENCOUNTER — Other Ambulatory Visit: Payer: Self-pay | Admitting: Internal Medicine

## 2021-12-02 MED ORDER — CITALOPRAM HYDROBROMIDE 40 MG PO TABS
40.0000 mg | ORAL_TABLET | Freq: Every day | ORAL | 0 refills | Status: DC
  Filled 2021-12-02: qty 90, 90d supply, fill #0

## 2021-12-02 NOTE — Telephone Encounter (Signed)
Requested Prescriptions  Pending Prescriptions Disp Refills   citalopram (CELEXA) 40 MG tablet 90 tablet 0    Sig: TAKE 1 TABLET (40 MG TOTAL) BY MOUTH DAILY.     Psychiatry:  Antidepressants - SSRI Passed - 12/02/2021  9:54 PM      Passed - Completed PHQ-2 or PHQ-9 in the last 360 days      Passed - Valid encounter within last 6 months    Recent Outpatient Visits          3 months ago Encounter for general adult medical examination with abnormal findings   [facility], Coralie Keens, NP      Future Appointments            In 7 months Hollice Espy, MD [place]

## 2021-12-03 ENCOUNTER — Other Ambulatory Visit (HOSPITAL_COMMUNITY): Payer: Self-pay

## 2022-01-19 ENCOUNTER — Other Ambulatory Visit: Payer: No Typology Code available for payment source

## 2022-02-12 ENCOUNTER — Other Ambulatory Visit: Payer: Self-pay

## 2022-02-12 ENCOUNTER — Encounter (HOSPITAL_COMMUNITY): Payer: Self-pay | Admitting: Emergency Medicine

## 2022-02-12 ENCOUNTER — Emergency Department (HOSPITAL_COMMUNITY)
Admission: EM | Admit: 2022-02-12 | Discharge: 2022-02-13 | Disposition: A | Discharge status: Home / Self Care | Payer: No Typology Code available for payment source | Attending: Student | Admitting: Student

## 2022-02-12 DIAGNOSIS — Z7984 Long term (current) use of oral hypoglycemic drugs: Secondary | ICD-10-CM | POA: Insufficient documentation

## 2022-02-12 DIAGNOSIS — E119 Type 2 diabetes mellitus without complications: Secondary | ICD-10-CM | POA: Insufficient documentation

## 2022-02-12 DIAGNOSIS — Z7982 Long term (current) use of aspirin: Secondary | ICD-10-CM | POA: Insufficient documentation

## 2022-02-12 DIAGNOSIS — B029 Zoster without complications: Secondary | ICD-10-CM | POA: Diagnosis not present

## 2022-02-12 DIAGNOSIS — R21 Rash and other nonspecific skin eruption: Secondary | ICD-10-CM | POA: Diagnosis present

## 2022-02-12 NOTE — ED Triage Notes (Signed)
Pt reported to ED with c/o rash tjat first appeared three days ago as welt on left side of abdomen, has noticed that welts have gradually spread upwards and are localized to left side of the body.  Also noted under left eye and pt reports area as "draining". Denies any other symptoms at this time.  ?

## 2022-02-12 NOTE — ED Provider Triage Note (Signed)
Emergency Medicine Provider Triage Evaluation Note ? ?Marc Aguilar , a 61 y.o. male  was evaluated in triage.  Pt complains of rash.  Rash started about 3 days ago.  He has multiple well sized areas follow-up around his body.  He noticed one on the left side of his torso, there are multiple on his left shoulder, and also beneath his left eye.  He has no visual changes.  There is no drainage from these areas.  He denies any fevers or chills.  No recent antibiotic or other medication use.  She says they are slightly itchy. ? ?Review of Systems  ?Positive: rash ?Negative:  ? ?Physical Exam  ?BP (!) 147/98 (BP Location: Right Arm)   Pulse (!) 108   Temp 99.4 ?F (37.4 ?C) (Oral)   Resp 18   SpO2 98%  ?Gen:   Awake, no distress   ?Resp:  Normal effort  ?MSK:   Moves extremities without difficulty  ?Other:  Urticarial rash appearance on torso, left shoulder, and beneath left eye. Not vesicular. No abnormal drainage.  ? ?Medical Decision Making  ?Medically screening exam initiated at 8:28 PM.  Appropriate orders placed.  TRUMAINE WIMER was informed that the remainder of the evaluation will be completed by another provider, this initial triage assessment does not replace that evaluation, and the importance of remaining in the ED until their evaluation is complete. ? ? ?  ?Adolphus Birchwood, PA-C ?02/12/22 2029 ? ?

## 2022-02-13 MED ORDER — VALACYCLOVIR HCL 1 G PO TABS: 1000.0000 mg | ORAL_TABLET | Freq: Three times a day (TID) | ORAL | 0 refills | Status: AC

## 2022-02-13 MED ORDER — PREDNISONE 10 MG PO TABS: 20.0000 mg | ORAL_TABLET | Freq: Every day | ORAL | 0 refills | Status: DC

## 2022-02-13 NOTE — Discharge Instructions (Signed)
You have shingles, if started on an antiviral as well as steroids please take as prescribed. ? ?Please follow with your primary care doctor in a week's time for reevaluation ? ?Come back to the emergency department if you develop chest pain, shortness of breath, severe abdominal pain, uncontrolled nausea, vomiting, diarrhea. ? ?

## 2022-02-13 NOTE — ED Provider Notes (Signed)
?[facility] ?Provider Note ? ? ?CSN: [id number] ?Arrival date & time: 02/12/22  1947 ? ?  ? ?History ? ?Chief Complaint  ?Patient presents with  ? Rash  ? ? ?Marc Aguilar is a 61 y.o. male. ? ?HPI ? ?Patient with medical history including GERD, migraine, anxiety, type 2 diabetes presents to the emerged part with complaints of a rash.  Patient is no rash on Wednesday, states on the left upper arm, and there was discharge to radiate up into his left shoulder and partly into his left side of his neck.  He states the rash is slightly itchy and burns, he denies any tongue throat lip swelling difficulty breathing, nausea or vomiting.  Denies any new medications, environmental changes, new foods, states she never had this in the past.  States the rash remains on the left side, he has no other complaints.  Does not endorse fevers chills general body aches. ? ?Home Medications ?Prior to Admission medications   ?Medication Sig Start Date End Date Taking? Authorizing Provider  ?predniSONE (DELTASONE) 10 MG tablet Take 2 tablets (20 mg total) by mouth daily. 02/13/22  Yes Marcello Fennel, PA-C  ?valACYclovir (VALTREX) 1000 MG tablet Take 1 tablet (1,000 mg total) by mouth 3 (three) times daily for 7 days. 02/13/22 02/20/22 Yes Marcello Fennel, PA-C  ?albuterol (VENTOLIN HFA) 108 (90 Base) MCG/ACT inhaler Inhale 2 puffs into the lungs every 6 (six) hours as needed for wheezing or shortness of breath. 03/26/19   [name], NP  ?Alcohol Swabs (ALCOHOL PREP) PADS 1 each by Does not apply route 2 (two) times daily. 02/01/17   [name], NP  ?amLODipine (NORVASC) 5 MG tablet Take 1 tablet (5 mg total) by mouth daily. 11/27/20   Martinique, Peter M, MD  ?aspirin EC 81 MG tablet Take 81 mg by mouth daily.    [provider]  ?atorvastatin (LIPITOR) 20 MG tablet Take 1 tablet (20 mg total) by mouth daily. 05/13/21   Kennyth Arnold, FNP  ?buPROPion (WELLBUTRIN XL) 300 MG 24 hr  tablet Take 1 tablet (300 mg total) by mouth daily. 08/13/21   [name], NP  ?citalopram (CELEXA) 40 MG tablet TAKE 1 TABLET (40 MG TOTAL) BY MOUTH DAILY. 12/02/21   [name], NP  ?ezetimibe (ZETIA) 10 MG tablet Take 1 tablet (10 mg total) by mouth daily. 08/27/21   [name], NP  ?Lancets MISC 1 each by Does not apply route in the morning and at bedtime. 02/06/20   [name], NP  ?metFORMIN (GLUCOPHAGE) 1000 MG tablet Take 1 tablet (1,000 mg total) by mouth 2 (two) times daily with a meal. 05/13/21   Kennyth Arnold, FNP  ?Multiple Vitamins-Minerals (MULTIVITAMIN WITH MINERALS) tablet Take 1 tablet by mouth daily.    [provider]  ?omeprazole (PRILOSEC) 20 MG capsule Take 1 capsule (20 mg total) by mouth daily. 08/13/21   [name], NP  ?ondansetron (ZOFRAN) 4 MG tablet Take 1 tablet (4 mg total) by mouth every 8 (eight) hours as needed for nausea or vomiting. 03/08/21   Sharion Balloon, FNP  ?sildenafil (REVATIO) 20 MG tablet Take 1 to 5 tablets by mouth as needed prior to intercourse 03/09/21 03/09/22  Hollice Espy, MD  ?tadalafil (CIALIS) 20 MG tablet Take 1 tablet (20 mg total) by mouth daily as needed for erectile dysfunction. 07/21/21   Hollice Espy, MD  ?traZODone (DESYREL) 50 MG  tablet Take 0.5-1 tablets (25-50 mg total) by mouth at bedtime as needed for sleep. 08/13/21   [name], NP  ?   ? ?Allergies    ?Tape   ? ?Review of Systems   ?Review of Systems  ?Constitutional:  Negative for chills and fever.  ?Respiratory:  Negative for shortness of breath.   ?Cardiovascular:  Negative for chest pain.  ?Gastrointestinal:  Negative for abdominal pain.  ?Skin:  Positive for rash.  ?Neurological:  Negative for headaches.  ? ?Physical Exam ?Updated Vital Signs ?BP (!) 156/104 (BP Location: Left Arm)   Pulse 76   Temp 98.9 ?F (37.2 ?C) (Oral)   Resp 18   SpO2 100%  ?Physical Exam ?Vitals and nursing note reviewed.  ?Constitutional:   ?   General: He is not in  acute distress. ?   Appearance: He is not ill-appearing.  ?HENT:  ?   Head: Normocephalic and atraumatic.  ?   Right Ear: Tympanic membrane, ear canal and external ear normal.  ?   Left Ear: Tympanic membrane, ear canal and external ear normal.  ?   Nose: No congestion.  ?   Comments: No lesions noted on patient's nose or forehead ?   Mouth/Throat:  ?   Mouth: Mucous membranes are moist.  ?   Pharynx: Oropharynx is clear.  ?   Comments: No trismus no torticollis, controlling oral secretions, tongue you have both midline, tonsils equal symmetric bilaterally, no oral lesions present. ?Eyes:  ?   Extraocular Movements: Extraocular movements intact.  ?   Conjunctiva/sclera: Conjunctivae normal.  ?Cardiovascular:  ?   Rate and Rhythm: Normal rate and regular rhythm.  ?   Pulses: Normal pulses.  ?Skin: ?   General: Skin is warm and dry.  ?   Comments: Patient has a papule like rash on the left upper anterior aspect of the arm, it migrates up into his left shoulder and onto the left part of his neck, does not cross the midline, there is no vesicles, no scaling of the skin, blanches with pressure.  Rash not noted anywhere else other than the left upper aspect of his arm and neck.  ?Neurological:  ?   Mental Status: He is alert.  ?Psychiatric:     ?   Mood and Affect: Mood normal.  ? ? ?ED Results / Procedures / Treatments   ?Labs ?(all labs ordered are listed, but only abnormal results are displayed) ?Labs Reviewed - No data to display ? ?EKG ?None ? ?Radiology ?No results found. ? ?Procedures ?Procedures  ? ? ?Medications Ordered in ED ?Medications - No data to display ? ?ED Course/ Medical Decision Making/ A&P ?  ?                        ?Medical Decision Making ? ?This patient presents to the ED for concern of rash, this involves an extensive number of treatment options, and is a complaint that carries with it a high risk of complications and morbidity.  The differential diagnosis includes TEM, Stevens-Johnson's,  anaphylaxis, ? ? ? ?Additional history obtained: ? ?Additional history obtained from N/A ?External records from outside source obtained and reviewed including previous primary care notes ? ? ?Co morbidities that complicate the patient evaluation ? ?Diabetes ? ?Social Determinants of Health: ? ?N/A ? ? ? ?Lab Tests: ? ?I Ordered, and personally interpreted labs.  The pertinent results include: N/A ? ? ?Imaging Studies ordered: ? ?I ordered  imaging studies including N/A ?I independently visualized and interpreted imaging which showed N/A ?I agree with the radiologist interpretation ? ? ?Cardiac Monitoring: ? ?The patient was maintained on a cardiac monitor.  I personally viewed and interpreted the cardiac monitored which showed an underlying rhythm of: N/A ? ? ?Medicines ordered and prescription drug management: ? ?I ordered medication including N/A ?I have reviewed the patients home medicines and have made adjustments as needed ? ?Critical Interventions: ? ?N/AA ? ? ? ?Reevaluation: ? ?Presents with a rash, rash is isolated to the left upper arm does not cross the midline presentation consistent with shingles, will provide him with steroids and antivirals and discharge.  He is agreement this plan ? ?Consultations Obtained: ? ?N/A ? ? ?Test Considered: ? ?Lab work but will defer as my suspicion for infectious etiology is very low at this time presentations consistent with shingles. ? ? ? ?Rule out ?Low suspicion for TENS or Stevens-Johnson's no skin sloughing, no oral lesions present.  Low suspicion for cellulitis as presentation is atypical etiology.  Motion for anaphylaxis as rash is isolated to left upper arm no associate GI symptoms, vital signs reassuring.  Low suspicion for Ramsay Hunt/ Herpes zoster ophthalmicus is there is no lesions noted on patient's TM or auricle, no rash noted on patient's nose, no difficulty hearing, no facial paralysis ? ? ?Dispostion and problem list ? ?After consideration of the  diagnostic results and the patients response to treatment, I feel that the patent would benefit from discharge.  ? ?Rash-likely shingles, will start him on steroids, antivirals follow-up with PCP for further evaluation and s

## 2022-02-18 ENCOUNTER — Other Ambulatory Visit: Payer: No Typology Code available for payment source

## 2022-02-18 DIAGNOSIS — C61 Malignant neoplasm of prostate: Secondary | ICD-10-CM

## 2022-02-19 ENCOUNTER — Encounter: Payer: Self-pay | Admitting: Internal Medicine

## 2022-02-19 ENCOUNTER — Ambulatory Visit (INDEPENDENT_AMBULATORY_CARE_PROVIDER_SITE_OTHER): Payer: No Typology Code available for payment source | Admitting: Internal Medicine

## 2022-02-19 VITALS — BP 126/84 | HR 103 | Temp 96.8°F | Wt 226.0 lb

## 2022-02-19 DIAGNOSIS — R2242 Localized swelling, mass and lump, left lower limb: Secondary | ICD-10-CM

## 2022-02-19 DIAGNOSIS — M5412 Radiculopathy, cervical region: Secondary | ICD-10-CM

## 2022-02-19 DIAGNOSIS — B029 Zoster without complications: Secondary | ICD-10-CM

## 2022-02-19 DIAGNOSIS — M25511 Pain in right shoulder: Secondary | ICD-10-CM

## 2022-02-19 DIAGNOSIS — R202 Paresthesia of skin: Secondary | ICD-10-CM

## 2022-02-19 DIAGNOSIS — M79672 Pain in left foot: Secondary | ICD-10-CM

## 2022-02-19 DIAGNOSIS — Z5181 Encounter for therapeutic drug level monitoring: Secondary | ICD-10-CM

## 2022-02-19 DIAGNOSIS — G8929 Other chronic pain: Secondary | ICD-10-CM

## 2022-02-19 LAB — PSA: Prostate Specific Ag, Serum: <0.1 ng/mL (ref 0.0–4.0)

## 2022-02-19 LAB — GLUCOSE, POCT (MANUAL RESULT ENTRY): POC Glucose: 155 mg/dl — AB (ref 70–99)

## 2022-02-19 NOTE — Progress Notes (Signed)
? ?Subjective:  ? ? Patient ID: Marc Aguilar, male    DOB: [birth year], 61 y.o.   MRN: [id number] ? ?HPI ? ?Patient presents to clinic today for ER follow-up.  He presented to the ER 4/28 with complaint of a rash.  He was diagnosed with shingles and treated with Prednisone and Valacyclovir.  He has been taking the medication as prescribed and has not completed this course.  He has not had a shingles vaccine.  He reports the rash seems to be improving. ? ?He is also concerned about left foot pain and swelling.  He noticed this a few weeks ago.  He describes the pain as sharp and stabbing.  He reports associated numbness and tingling.  The pain is worse with weightbearing.  He denies any injury to the area.  He has not taken anything OTC for this. ? ?He also wants to follow-up on his chronic right shoulder pain.  He reports the pain is persistent but he seems to have more weakness.  He describes the pain as sore and achy with associated numbness and tingling of his right upper extremity.  He denies any current neck pain. ? ?X-ray cervical spine from 07/2021 showed: ?IMPRESSION: ?Negative cervical spine radiographs. ? ?X-ray of the right shoulder from 11/2018 showed: ?IMPRESSION: ?Mild degenerative change without acute abnormality. ? ?He has been doing physical therapy but has not been making any improvement.  If anything he thinks he is having more right upper extremity weakness. ? ?Review of Systems ? ?   ?Past Medical History:  ?Diagnosis Date  ? Anxiety   ? Arthritis   ? "knees and hips" (02/28/2018)  ? Cancer [facility])   ? prostate   ? Chicken pox   ? Depression   ? GERD (gastroesophageal reflux disease)   ? Heart murmur   ? Hernia, abdominal   ? High cholesterol   ? History of gout   ? Migraine   ? "probably monthly" (02/28/2018)  ? Neck pain   ? OSA on CPAP   ? Pneumonia   ? "once" (02/28/2018)  ? Refusal of blood transfusions as patient is Jehovah's Witness   ? Seasonal allergies   ? Type II diabetes mellitus ([place])    ? ? ?Current Outpatient Medications  ?Medication Sig Dispense Refill  ? albuterol (VENTOLIN HFA) 108 (90 Base) MCG/ACT inhaler Inhale 2 puffs into the lungs every 6 (six) hours as needed for wheezing or shortness of breath. 1 Inhaler 0  ? Alcohol Swabs (ALCOHOL PREP) PADS 1 each by Does not apply route 2 (two) times daily. 200 each 3  ? amLODipine (NORVASC) 5 MG tablet Take 1 tablet (5 mg total) by mouth daily. 90 tablet 3  ? aspirin EC 81 MG tablet Take 81 mg by mouth daily.    ? atorvastatin (LIPITOR) 20 MG tablet Take 1 tablet (20 mg total) by mouth daily. 90 tablet 0  ? buPROPion (WELLBUTRIN XL) 300 MG 24 hr tablet Take 1 tablet (300 mg total) by mouth daily. 90 tablet 0  ? citalopram (CELEXA) 40 MG tablet TAKE 1 TABLET (40 MG TOTAL) BY MOUTH DAILY. 90 tablet 0  ? ezetimibe (ZETIA) 10 MG tablet Take 1 tablet (10 mg total) by mouth daily. 90 tablet 0  ? Lancets MISC 1 each by Does not apply route in the morning and at bedtime. 200 each 2  ? metFORMIN (GLUCOPHAGE) 1000 MG tablet Take 1 tablet (1,000 mg total) by mouth 2 (two) times daily with a  meal. 180 tablet 1  ? Multiple Vitamins-Minerals (MULTIVITAMIN WITH MINERALS) tablet Take 1 tablet by mouth daily.    ? omeprazole (PRILOSEC) 20 MG capsule Take 1 capsule (20 mg total) by mouth daily. 90 capsule 0  ? ondansetron (ZOFRAN) 4 MG tablet Take 1 tablet (4 mg total) by mouth every 8 (eight) hours as needed for nausea or vomiting. 20 tablet 0  ? predniSONE (DELTASONE) 10 MG tablet Take 2 tablets (20 mg total) by mouth daily. 15 tablet 0  ? sildenafil (REVATIO) 20 MG tablet Take 1 to 5 tablets by mouth as needed prior to intercourse 30 tablet 11  ? tadalafil (CIALIS) 20 MG tablet Take 1 tablet (20 mg total) by mouth daily as needed for erectile dysfunction. 10 tablet 6  ? traZODone (DESYREL) 50 MG tablet Take 0.5-1 tablets (25-50 mg total) by mouth at bedtime as needed for sleep. 30 tablet 0  ? valACYclovir (VALTREX) 1000 MG tablet Take 1 tablet (1,000 mg total) by  mouth 3 (three) times daily for 7 days. 21 tablet 0  ? ?No current facility-administered medications for this visit.  ? ? ?Allergies  ?Allergen Reactions  ? Tape Rash  ?  Paper tape-blisters  ? ? ?Family History  ?Problem Relation Age of Onset  ? Diabetes Mother   ? Hyperlipidemia Mother   ? Bladder Cancer Father   ? Liver cancer Sister   ? Diabetes Sister   ? Diabetes Maternal Aunt   ? Arthritis Maternal Grandmother   ? Diabetes Maternal Grandmother   ? Arthritis Maternal Grandfather   ? Arthritis Paternal Grandmother   ? Diabetes Brother   ? Heart disease Brother   ? Colon cancer Neg Hx   ? ? ?Social History  ? ?Socioeconomic History  ? Marital status: Married  ?  Spouse name: Not on file  ? Number of children: 2  ? Years of education: Not on file  ? Highest education level: Not on file  ?Occupational History  ? 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  ?Tobacco Use  ? Smoking status: Former  ?  Packs/day: 1.00  ?  Years: 27.00  ?  Pack years: 27.00  ?  Types: Cigarettes  ?  Quit date: 10/18/2009  ?  Years since quitting: 12.3  ? Smokeless tobacco: Former  ?  Types: Chew  ?  Quit date: 10/18/1994  ?Vaping Use  ? Vaping Use: Never used  ?Substance and Sexual Activity  ? Alcohol use: Yes  ?  Alcohol/week: 12.0 standard drinks  ?  Types: 12 Cans of beer per week  ? Drug use: Never  ? Sexual activity: Not on file  ?Other Topics Concern  ? Not on file  ?Social History Narrative  ? Not on file  ? ?Social Determinants of Health  ? ?Financial Resource Strain: Not on file  ?Food Insecurity: Not on file  ?Transportation Needs: Not on file  ?Physical Activity: Not on file  ?Stress: Not on file  ?Social Connections: Not on file  ?Intimate Partner Violence: Not on file  ? ? ? ?Constitutional: Denies fever, malaise, fatigue, headache or abrupt weight changes.  ?Respiratory: Denies difficulty breathing, shortness of breath, cough or sputum production.   ?Cardiovascular: Denies chest pain, chest tightness, palpitations or swelling in the hands or  feet.  ?Musculoskeletal: Patient reports right shoulder pain and weakness, left foot pain and swelling.  Denies decrease in range of motion, difficulty with gait, muscle pain.  ?Skin: Patient reports healing rash to left shoulder, neck and  face.   ?Neurological: Patient reports numbness and tingling of his right upper extremity and left foot.  Denies dizziness, difficulty with memory, difficulty with speech or problems with balance and coordination.  ? ? ?No other specific complaints in a complete review of systems (except as listed in HPI above). ? ?Objective:  ? Physical Exam ? ? ?BP 126/84 (BP Location: Left Arm, Patient Position: Sitting, Cuff Size: Large)   Pulse (!) 103   Temp (!) 96.8 ?F (36 ?C) (Temporal)   Wt 226 lb (102.5 kg)   SpO2 99%   BMI 31.52 kg/m?  ? ?Wt Readings from Last 3 Encounters:  ?08/13/21 230 lb 6.4 oz (104.5 kg)  ?07/21/21 227 lb (103 kg)  ?01/01/21 241 lb (109.3 kg)  ? ? ?General: Appears his stated age, obese in NAD. ?Skin: Maculopapular rash noted to left side of neck, left side of face and left shoulder in a dermatomal pattern. ?Cardiovascular: Normal rate and rhythm. S1,S2 noted.  No murmur, rubs or gallops noted.  Radial pulse 2+ on the right.  Pedal pulses 2+ on the left. ?Pulmonary/Chest: Normal effort and positive vesicular breath sounds. No respiratory distress. No wheezes, rales or ronchi noted.  ?Musculoskeletal: Normal flexion, extension and rotation of the cervical spine.  No bony tenderness noted over the cervical spine.  Shoulder shrug equal.  He has normal but painful internal and external rotation of the right shoulder.  Negative drop can test on the right.  Strength 5/5 BUE.  Handgrips equal.  He has pain with palpation over the second and third mid metatarsals. ?Neurological: Alert and oriented.  Sensation intact to LLE. ? ? ?BMET ?   ?Component Value Date/Time  ? NA 136 08/18/2021 0954  ? K 3.8 08/18/2021 0954  ? CL 100 08/18/2021 0954  ? CO2 26 08/18/2021 0954  ?  GLUCOSE 162 (H) 08/18/2021 0954  ? BUN 29 (H) 08/18/2021 0954  ? CREATININE 1.24 08/18/2021 0954  ? CALCIUM 9.4 08/18/2021 0954  ? GFRNONAA >60 09/26/2020 1951  ? GFRAA >60 11/20/2019 0445  ? ? ?Lipid Panel  ?

## 2022-02-19 NOTE — Patient Instructions (Signed)
Morton Neuralgia  Morton neuralgia is foot pain that affects the ball of the foot and the area near the toes. Morton neuralgia occurs when part of a nerve in the foot (digital nerve) is under too much pressure (compressed). When this happens over a long period of time, the nerve can thicken (neuroma) and cause pain. Pain usually occurs between the third and fourth toes.  Morton neuralgia can come and go but may get worse over time. What are the causes? This condition is caused by doing the same things over and over with your foot, such as: Activities such as running or jumping. Wearing shoes that are too tight. What increases the risk? You may be at higher risk for Morton neuralgia if you: Are male. Wear high heels. Wear shoes that are narrow or tight. Do activities that repeatedly stretch your toes, such as: Running. Ballet. Long-distance walking. What are the signs or symptoms? The first symptom of Morton neuralgia is pain that spreads from the ball of the foot to the toes. It may feel like you are walking on a marble. Pain usually gets worse with walking and goes away at night. Other symptoms may include numbness and cramping of your toes. Both feet are equally affected, but rarely at the same time. How is this diagnosed? This condition is diagnosed based on your symptoms, your medical history, and a physical exam. Your health care provider may: Squeeze your foot just behind your toe. Ask you to move your toes to check for pain. Ask about your physical activity level. You also may have imaging tests, such as an X-ray, ultrasound, or MRI. How is this treated? Treatment depends on how severe your condition is and what causes it. Treatment may involve: Wearing different shoes that are not too tight, are low-heeled, and provide good support. For some people, this is the only treatment needed. Wearing an over-the-counter or custom supportive pad (orthotic) under the front of your  foot. Getting injections of numbing medicine and anti-inflammatory medicine (steroid) in the nerve. Having surgery to remove part of the thickened nerve. Follow these instructions at home: Managing pain, stiffness, and swelling  Massage your foot as needed. Wear orthotics as told by your health care provider. If directed, put ice on the painful area. To do this: Put ice in a plastic bag. Place a towel between your skin and the bag. Leave the ice on for 20 minutes, 2-3 times a day. Remove the ice if your skin turns bright red. This is very important. If you cannot feel pain, heat, or cold, you have a greater risk of damage to the area. Raise (elevate) the injured area above the level of your heart while you are sitting or lying down. Avoid activities that cause pain or make pain worse. If you play sports, ask your health care provider when it is safe for you to return to sports. General instructions Take over-the-counter and prescription medicines only as told by your health care provider. For the time period you were told by your health care provider, do not drive or use machinery. Wear shoes that: Have soft soles. Have a wide toe area. Provide arch support. Do not pinch or squeeze your feet. Have room for your orthotics, if this applies. Keep all follow-up visits. This is important. Contact a health care provider if: Your symptoms get worse or do not get better with treatment and home care. Summary Morton neuralgia is foot pain that affects the ball of the foot and the   area near the toes. Pain usually occurs between the third and fourth toes, gets worse with walking, and goes away at night. Morton neuralgia occurs when part of a nerve in the foot (digital nerve) is under too much pressure. When this happens over a long period of time, the nerve can thicken (neuroma) and cause pain. This condition is caused by doing the same things over and over with your foot, such as running or  jumping, wearing shoes that are too tight, or wearing high heels. Treatment may involve wearing low-heeled shoes that are not too tight, wearing a supportive pad (orthotic) under the front of your foot, getting injections in the nerve, or having surgery to remove part of the thickened nerve. This information is not intended to replace advice given to you by your health care provider. Make sure you discuss any questions you have with your health care provider. Document Revised: 03/13/2021 Document Reviewed: 03/13/2021 Elsevier Patient Education  2023 [employer]vier Inc.  

## 2022-02-21 ENCOUNTER — Encounter: Payer: Self-pay | Admitting: Internal Medicine

## 2022-03-10 ENCOUNTER — Ambulatory Visit
Admission: RE | Admit: 2022-03-10 | Discharge: 2022-03-10 | Disposition: A | Discharge status: Home / Self Care | Payer: No Typology Code available for payment source | Source: Ambulatory Visit | Attending: Internal Medicine | Admitting: Internal Medicine

## 2022-03-10 DIAGNOSIS — M25511 Pain in right shoulder: Secondary | ICD-10-CM | POA: Insufficient documentation

## 2022-03-10 DIAGNOSIS — M5412 Radiculopathy, cervical region: Secondary | ICD-10-CM | POA: Insufficient documentation

## 2022-03-10 DIAGNOSIS — M79672 Pain in left foot: Secondary | ICD-10-CM | POA: Insufficient documentation

## 2022-03-10 DIAGNOSIS — G8929 Other chronic pain: Secondary | ICD-10-CM

## 2022-03-10 DIAGNOSIS — R202 Paresthesia of skin: Secondary | ICD-10-CM

## 2022-03-12 ENCOUNTER — Telehealth: Payer: Self-pay

## 2022-03-12 DIAGNOSIS — G8929 Other chronic pain: Secondary | ICD-10-CM

## 2022-03-12 DIAGNOSIS — M5412 Radiculopathy, cervical region: Secondary | ICD-10-CM

## 2022-03-12 NOTE — Telephone Encounter (Signed)
Referrals placed 

## 2022-03-12 NOTE — Telephone Encounter (Signed)
Copied from [place] [id number]. Topic: Referral - Request for Referral >> Mar 12, 2022  9:32 AM Tessa Lerner A wrote: Has patient seen PCP for this complaint? Yes.   *If NO, is insurance requiring patient see PCP for this issue before PCP can refer them? Referral for which specialty: Neurosurgery  Preferred provider/office: Patient has no preference  Reason for referral: right shoulder concern

## 2022-03-12 NOTE — Telephone Encounter (Signed)
Copied from [place] [id number]. Topic: Referral - Request for Referral >> Mar 12, 2022  9:29 AM Tessa Lerner A wrote: Has patient seen PCP for this complaint? Yes.   *If NO, is insurance requiring patient see PCP for this issue before PCP can refer them? Referral for which specialty: Orthopedics  Preferred provider/office: Patient has no preference  Reason for referral: Patient has right shoulder discomfort

## 2022-03-12 NOTE — Addendum Note (Signed)
Addended by: [name] on: 03/12/2022 10:21 AM   Modules accepted: Orders

## 2022-03-16 NOTE — Telephone Encounter (Signed)
Patient calling states that he has not heard anything from [facility]'s office regarding referral or who he should expect to hear from.  Patient would like updated information on referral.  Patient can be reached at [phone].

## 2022-03-17 ENCOUNTER — Telehealth: Payer: Self-pay

## 2022-03-17 NOTE — Telephone Encounter (Signed)
Is this okay to send?  Thanks,   -Mickel Baas

## 2022-03-17 NOTE — Telephone Encounter (Signed)
Advised Mr. Raptis his information was fax to [facility].  I gave him the contact information and he will give them a call.   Thanks,   -Mickel Baas

## 2022-03-17 NOTE — Telephone Encounter (Signed)
Copied from [place] [id number]. Topic: General - Other >> Mar 17, 2022 12:00 PM Marc Aguilar wrote: Reason for CRM: Pt called in stating the referral office he was referred to needed his last visit office notes, for ORTHOPEDIC SURGERY and Neurosurgery referral, please advise.

## 2022-03-18 ENCOUNTER — Encounter: Payer: Self-pay | Admitting: Internal Medicine

## 2022-03-18 ENCOUNTER — Other Ambulatory Visit (HOSPITAL_COMMUNITY): Payer: Self-pay

## 2022-03-18 ENCOUNTER — Ambulatory Visit (INDEPENDENT_AMBULATORY_CARE_PROVIDER_SITE_OTHER): Payer: No Typology Code available for payment source | Admitting: Internal Medicine

## 2022-03-18 VITALS — BP 146/80 | HR 92 | Temp 97.1°F | Wt 235.0 lb

## 2022-03-18 DIAGNOSIS — G4733 Obstructive sleep apnea (adult) (pediatric): Secondary | ICD-10-CM | POA: Diagnosis not present

## 2022-03-18 DIAGNOSIS — G43C1 Periodic headache syndromes in child or adult, intractable: Secondary | ICD-10-CM

## 2022-03-18 DIAGNOSIS — E782 Mixed hyperlipidemia: Secondary | ICD-10-CM

## 2022-03-18 DIAGNOSIS — E1165 Type 2 diabetes mellitus with hyperglycemia: Secondary | ICD-10-CM

## 2022-03-18 DIAGNOSIS — I10 Essential (primary) hypertension: Secondary | ICD-10-CM | POA: Diagnosis not present

## 2022-03-18 DIAGNOSIS — F419 Anxiety disorder, unspecified: Secondary | ICD-10-CM

## 2022-03-18 DIAGNOSIS — F32A Depression, unspecified: Secondary | ICD-10-CM

## 2022-03-18 DIAGNOSIS — G4701 Insomnia due to medical condition: Secondary | ICD-10-CM

## 2022-03-18 DIAGNOSIS — M159 Polyosteoarthritis, unspecified: Secondary | ICD-10-CM

## 2022-03-18 DIAGNOSIS — K219 Gastro-esophageal reflux disease without esophagitis: Secondary | ICD-10-CM

## 2022-03-18 DIAGNOSIS — E6609 Other obesity due to excess calories: Secondary | ICD-10-CM

## 2022-03-18 DIAGNOSIS — Z6832 Body mass index (BMI) 32.0-32.9, adult: Secondary | ICD-10-CM

## 2022-03-18 DIAGNOSIS — M5412 Radiculopathy, cervical region: Secondary | ICD-10-CM | POA: Insufficient documentation

## 2022-03-18 LAB — POCT GLYCOSYLATED HEMOGLOBIN (HGB A1C): Hemoglobin A1C: 5.8 % — AB (ref 4.0–5.6)

## 2022-03-18 MED ORDER — AMLODIPINE BESYLATE 10 MG PO TABS
10.0000 mg | ORAL_TABLET | Freq: Every day | ORAL | 1 refills | Status: DC
  Filled 2022-03-18: qty 90, 90d supply, fill #0
  Filled 2022-06-14: qty 90, 90d supply, fill #1

## 2022-03-18 MED ORDER — TRAZODONE HCL 50 MG PO TABS
50.0000 mg | ORAL_TABLET | Freq: Every evening | ORAL | 0 refills | Status: DC | PRN
  Filled 2022-03-18: qty 90, 90d supply, fill #0

## 2022-03-18 NOTE — Assessment & Plan Note (Signed)
He is awaiting neurosurgery appointment for further evaluation and treatment

## 2022-03-18 NOTE — Assessment & Plan Note (Signed)
Stable on his current dose of Citalopram and Bupropion Support offered

## 2022-03-18 NOTE — Assessment & Plan Note (Signed)
C-Met and lipid profile today Encouraged him to consume a low-fat diet Continue Atorvastatin and Ezetimibe

## 2022-03-18 NOTE — Assessment & Plan Note (Signed)
Encourage diet and exercise for weight loss 

## 2022-03-18 NOTE — Patient Instructions (Signed)

## 2022-03-18 NOTE — Assessment & Plan Note (Signed)
Advised him to take Tylenol OTC as needed

## 2022-03-18 NOTE — Assessment & Plan Note (Signed)
Continue Trazodone, refilled today

## 2022-03-18 NOTE — Progress Notes (Signed)
Subjective:    Patient ID: Marc Aguilar, male    DOB: [birth year], 61 y.o.   MRN: [id number]  HPI  Patient due for follow-up of chronic conditions.  Migraines: These occur a few times per week.  Triggered by his neck pain.  He takes Excedrin Migraine as needed with good relief of symptoms.  He does not follow with neurology.  OSA: He averages 4 to 5 hours per night with the use of his CPAP.  Sleep study from 12/2016 reviewed.  HLD: His last LDL was 67, triglycerides 387, 08/2021.  He denies myalgias on Atorvastatin and Ezetimibe.  He does not consume a low-fat diet.  HTN: His BP today is 146/80.  He is taking Amlodipine as prescribed.  ECG from 07/2021 reviewed.  DM2: His last A1c was 5.2%, 08/2021.  His sugars range  136-198.  He is taking Metformin as prescribed.  He checks his feet routinely.  His last eye exam was 09/2021.  Flu 07/2021.  Pneumovax 10/2018.  Elsie x3.  Anxiety and Depression: Chronic, managed on Citalopram and Wellbutrin.  He is not currently seeing a therapist.  He denies SI/HI.  GERD: He is not sure what triggers this.  He denies breakthrough on Omeprazole.  There is no upper GI on file.  Insomnia: He has difficulty falling and staying asleep.  He is taking Trazodone as needed with good results.  Sleep study from 12/2016 reviewed.  Cervical Radiculitis: MRI cervical spine from 02/2022 reviewed.  He has been referred to neurosurgery for further evaluation and treatment  Chronic Right Shoulder Pain: MRI right shoulder from 02/2022 reviewed.  He has been referred to orthopedics for further evaluation and management.  Review of Systems     Past Medical History:  Diagnosis Date   Anxiety    Arthritis    "knees and hips" (02/28/2018)   Cancer (HCC)    prostate    Chicken pox    Depression    GERD (gastroesophageal reflux disease)    Heart murmur    Hernia, abdominal    High cholesterol    History of gout    Migraine    "probably monthly" (02/28/2018)    Neck pain    OSA on CPAP    Pneumonia    "once" (02/28/2018)   Refusal of blood transfusions as patient is Jehovah's Witness    Seasonal allergies    Type II diabetes mellitus (HCC)     Current Outpatient Medications  Medication Sig Dispense Refill   albuterol (VENTOLIN HFA) 108 (90 Base) MCG/ACT inhaler Inhale 2 puffs into the lungs every 6 (six) hours as needed for wheezing or shortness of breath. 1 Inhaler 0   Alcohol Swabs (ALCOHOL PREP) PADS 1 each by Does not apply route 2 (two) times daily. 200 each 3   amLODipine (NORVASC) 5 MG tablet Take 1 tablet (5 mg total) by mouth daily. 90 tablet 3   aspirin EC 81 MG tablet Take 81 mg by mouth daily.     atorvastatin (LIPITOR) 20 MG tablet Take 1 tablet (20 mg total) by mouth daily. 90 tablet 0   buPROPion (WELLBUTRIN XL) 300 MG 24 hr tablet Take 1 tablet (300 mg total) by mouth daily. 90 tablet 0   citalopram (CELEXA) 40 MG tablet TAKE 1 TABLET (40 MG TOTAL) BY MOUTH DAILY. 90 tablet 0   ezetimibe (ZETIA) 10 MG tablet Take 1 tablet (10 mg total) by mouth daily. 90 tablet 0   Lancets MISC 1  each by Does not apply route in the morning and at bedtime. 200 each 2   metFORMIN (GLUCOPHAGE) 1000 MG tablet Take 1 tablet (1,000 mg total) by mouth 2 (two) times daily with a meal. 180 tablet 1   Multiple Vitamins-Minerals (MULTIVITAMIN WITH MINERALS) tablet Take 1 tablet by mouth daily.     omeprazole (PRILOSEC) 20 MG capsule Take 1 capsule (20 mg total) by mouth daily. 90 capsule 0   ondansetron (ZOFRAN) 4 MG tablet Take 1 tablet (4 mg total) by mouth every 8 (eight) hours as needed for nausea or vomiting. 20 tablet 0   predniSONE (DELTASONE) 10 MG tablet Take 2 tablets (20 mg total) by mouth daily. 15 tablet 0   tadalafil (CIALIS) 20 MG tablet Take 1 tablet (20 mg total) by mouth daily as needed for erectile dysfunction. 10 tablet 6   traZODone (DESYREL) 50 MG tablet Take 0.5-1 tablets (25-50 mg total) by mouth at bedtime as needed for sleep. 30  tablet 0   No current facility-administered medications for this visit.    Allergies  Allergen Reactions   Tape Rash    Paper tape-blisters    Family History  Problem Relation Age of Onset   Diabetes Mother    Hyperlipidemia Mother    Bladder Cancer Father    Liver cancer Sister    Diabetes Sister    Diabetes Maternal Aunt    Arthritis Maternal Grandmother    Diabetes Maternal Grandmother    Arthritis Maternal Grandfather    Arthritis Paternal Grandmother    Diabetes Brother    Heart disease Brother    Colon cancer Neg Hx     Social History   Socioeconomic History   Marital status: Married    Spouse name: Not on file   Number of children: 2   Years of education: Not on file   Highest education level: Not on file  Occupational History   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  Tobacco Use   Smoking status: Former    Packs/day: 1.00    Years: 27.00    Pack years: 27.00    Types: Cigarettes    Quit date: 10/18/2009    Years since quitting: 12.4   Smokeless tobacco: Former    Types: Chew    Quit date: 10/18/1994  Vaping Use   Vaping Use: Never used  Substance and Sexual Activity   Alcohol use: Yes    Alcohol/week: 12.0 standard drinks    Types: 12 Cans of beer per week   Drug use: Never   Sexual activity: Not on file  Other Topics Concern   Not on file  Social History Narrative   Not on file   Social Determinants of Health   Financial Resource Strain: Not on file  Food Insecurity: Not on file  Transportation Needs: Not on file  Physical Activity: Not on file  Stress: Not on file  Social Connections: Not on file  Intimate Partner Violence: Not on file     Constitutional: Patient reports intermittent headaches.  Denies fever, malaise, fatigue, or abrupt weight changes.  HEENT: Denies eye pain, eye redness, ear pain, ringing in the ears, wax buildup, runny nose, nasal congestion, bloody nose, or sore throat. Respiratory: Denies difficulty breathing, shortness of  breath, cough or sputum production.   Cardiovascular: Denies chest pain, chest tightness, palpitations or swelling in the hands or feet.  Gastrointestinal: Denies abdominal pain, bloating, constipation, diarrhea or blood in the stool.  GU: Denies urgency, frequency, pain with urination,  burning sensation, blood in urine, odor or discharge. Musculoskeletal: Patient reports chronic neck and shoulder pain.  Denies decrease in range of motion, difficulty with gait, muscle pain or joint swelling.  Skin: Denies redness, rashes, lesions or ulcercations.  Neurological: Patient reports paresthesias of hands, insomnia.  Denies dizziness, difficulty with memory, difficulty with speech or problems with balance and coordination.  Psych: Patient has a history of anxiety and depression.  Denies SI/HI.  No other specific complaints in a complete review of systems (except as listed in HPI above).  Objective:   Physical Exam  BP (!) 146/80 (BP Location: Right Arm, Patient Position: Sitting, Cuff Size: Large)   Pulse 92   Temp (!) 97.1 F (36.2 C) (Temporal)   Wt 235 lb (106.6 kg)   SpO2 99%   BMI 32.78 kg/m   Wt Readings from Last 3 Encounters:  02/19/22 226 lb (102.5 kg)  08/13/21 230 lb 6.4 oz (104.5 kg)  07/21/21 227 lb (103 kg)    General: Appears his stated age, obese, in NAD. Skin: Warm, dry and intact. No ulcerations noted. HEENT: Head: normal shape and size; Eyes: sclera white, no icterus, conjunctiva pink, PERRLA and EOMs intact;  Neck:  Neck supple, trachea midline. No masses, lumps or thyromegaly present.  Cardiovascular: Normal rate and rhythm. S1,S2 noted.  No murmur, rubs or gallops noted. No JVD or BLE edema. No carotid bruits noted. Pulmonary/Chest: Normal effort and positive vesicular breath sounds. No respiratory distress. No wheezes, rales or ronchi noted.  Abdomen: Soft and nontender. Normal bowel sounds.  Musculoskeletal: Normal flexion, extension and rotation of the cervical  spine. Normal internal and external rotation of the right shoulder. Negative drop can test on the right. Strength 5/5 BUE/BLE. Shoulder shrug equal. Hand grips equal.  No difficulty with gait.  Neurological: Alert and oriented. Cranial nerves II-XII grossly intact. Coordination normal.  Psychiatric: Mood and affect normal. Behavior is normal. Judgment and thought content normal.     BMET    Component Value Date/Time   NA 136 08/18/2021 0954   K 3.8 08/18/2021 0954   CL 100 08/18/2021 0954   CO2 26 08/18/2021 0954   GLUCOSE 162 (H) 08/18/2021 0954   BUN 29 (H) 08/18/2021 0954   CREATININE 1.24 08/18/2021 0954   CALCIUM 9.4 08/18/2021 0954   GFRNONAA >60 09/26/2020 1951   GFRAA >60 11/20/2019 0445    Lipid Panel     Component Value Date/Time   CHOL 163 08/18/2021 0954   CHOL 157 10/26/2019 1203   TRIG 387 (H) 08/18/2021 0954   HDL 49 08/18/2021 0954   HDL 48 10/26/2019 1203   CHOLHDL 3.3 08/18/2021 0954   VLDL 60.2 (H) 01/01/2021 1047   LDLCALC 67 08/18/2021 0954    CBC    Component Value Date/Time   WBC 4.4 08/18/2021 0954   RBC 5.19 08/18/2021 0954   HGB 14.0 08/18/2021 0954   HCT 43.1 08/18/2021 0954   PLT 239 08/18/2021 0954   MCV 83.0 08/18/2021 0954   MCH 27.0 08/18/2021 0954   MCHC 32.5 08/18/2021 0954   RDW 14.4 08/18/2021 0954   LYMPHSABS 3.0 09/26/2020 1951   MONOABS 0.6 09/26/2020 1951   EOSABS 0.2 09/26/2020 1951   BASOSABS 0.1 09/26/2020 1951    Hgb A1C Lab Results  Component Value Date   HGBA1C 5.2 08/18/2021            Assessment & Plan:    Webb Silversmith, NP

## 2022-03-18 NOTE — Assessment & Plan Note (Signed)
Encourage regular stretching Can continue Excedrin Migraine OTC as needed

## 2022-03-18 NOTE — Assessment & Plan Note (Signed)
Encourage weight loss as this can help reduce reflux Continue Omeprazole

## 2022-03-18 NOTE — Assessment & Plan Note (Signed)
Encourage weight loss as this can help reduce sleep apnea symptoms Continue CPAP 

## 2022-03-18 NOTE — Telephone Encounter (Signed)
Yes, this is typically not handled by the referral coordinator?

## 2022-03-18 NOTE — Assessment & Plan Note (Signed)
POCT A1c 5.8% Urine microalbumin checked 08/2021 Encouraged him to consume a low-carb diet and exercise for weight loss Continue Metformin Encourage routine eye exam Encourage routine foot exam Immunizations UTD

## 2022-03-18 NOTE — Assessment & Plan Note (Signed)
Elevated today Increase Amlodipine to 10 mg daily Reinforced DASH diet and exercise for weight loss C-Met today

## 2022-03-19 LAB — COMPLETE METABOLIC PANEL WITH GFR
AG Ratio: 1.6 (calc) (ref 1.0–2.5)
ALT: 16 U/L (ref 9–46)
AST: 22 U/L (ref 10–35)
Albumin: 4.4 g/dL (ref 3.6–5.1)
Alkaline phosphatase (APISO): 81 U/L (ref 35–144)
BUN: 17 mg/dL (ref 7–25)
CO2: 22 mmol/L (ref 20–32)
Calcium: 9.4 mg/dL (ref 8.6–10.3)
Chloride: 105 mmol/L (ref 98–110)
Creat: 0.92 mg/dL (ref 0.70–1.30)
Globulin: 2.8 g/dL (calc) (ref 1.9–3.7)
Glucose, Bld: 153 mg/dL — ABNORMAL HIGH (ref 65–139)
Potassium: 4.1 mmol/L (ref 3.5–5.3)
Sodium: 138 mmol/L (ref 135–146)
Total Bilirubin: 0.4 mg/dL (ref 0.2–1.2)
Total Protein: 7.2 g/dL (ref 6.1–8.1)
eGFR: 96 mL/min/{1.73_m2} (ref >=60)

## 2022-03-19 LAB — LIPID PANEL
Cholesterol: 195 mg/dL (ref <200)
HDL: 50 mg/dL (ref >=40)
Non-HDL Cholesterol (Calc): 145 mg/dL (calc) — ABNORMAL HIGH (ref <130)
Total CHOL/HDL Ratio: 3.9 (calc) (ref <5.0)
Triglycerides: 543 mg/dL — ABNORMAL HIGH (ref <150)

## 2022-03-19 LAB — CBC
HCT: 43.6 % (ref 38.5–50.0)
Hemoglobin: 14 g/dL (ref 13.2–17.1)
MCH: 27.1 pg (ref 27.0–33.0)
MCHC: 32.1 g/dL (ref 32.0–36.0)
MCV: 84.3 fL (ref 80.0–100.0)
MPV: 10.1 fL (ref 7.5–12.5)
Platelets: 256 10*3/uL (ref 140–400)
RBC: 5.17 10*6/uL (ref 4.20–5.80)
RDW: 15 % (ref 11.0–15.0)
WBC: 4.2 10*3/uL (ref 3.8–10.8)

## 2022-03-19 NOTE — Telephone Encounter (Signed)
Office notes faxed to [facility]   Thanks,   -Mickel Baas

## 2022-04-06 ENCOUNTER — Encounter: Payer: Self-pay | Admitting: Internal Medicine

## 2022-04-22 ENCOUNTER — Other Ambulatory Visit: Payer: Self-pay | Admitting: Orthopedic Surgery

## 2022-05-01 ENCOUNTER — Other Ambulatory Visit: Payer: Self-pay | Admitting: Internal Medicine

## 2022-05-04 ENCOUNTER — Other Ambulatory Visit (HOSPITAL_COMMUNITY): Payer: Self-pay

## 2022-05-04 MED ORDER — CYCLOBENZAPRINE HCL 10 MG PO TABS
10.0000 mg | ORAL_TABLET | Freq: Three times a day (TID) | ORAL | 0 refills | Status: DC | PRN
  Filled 2022-05-04: qty 30, 10d supply, fill #0

## 2022-05-04 NOTE — Telephone Encounter (Signed)
Requested medications are due for refill today.  unsure  Requested medications are on the active medications list.  No  Last refill. 01/01/2021 #30 0 refills  Future visit scheduled.   no  Notes to clinic.  Med not on med list. Refill not delegated.    Requested Prescriptions  Pending Prescriptions Disp Refills   cyclobenzaprine (FLEXERIL) 10 MG tablet 30 tablet 0    Sig: TAKE 1 TABLET (10 MG TOTAL) BY MOUTH 3 (THREE) TIMES DAILY AS NEEDED FOR MUSCLE SPASMS.     There is no refill protocol information for this order

## 2022-05-04 NOTE — Telephone Encounter (Signed)
Refilled 03/18/2022 #90. Requested Prescriptions  Pending Prescriptions Disp Refills  . traZODone (DESYREL) 50 MG tablet 90 tablet 0    Sig: Take 1 tablet (50 mg total) by mouth at bedtime as needed for sleep.     Psychiatry: Antidepressants - Serotonin Modulator Passed - 05/01/2022  9:58 PM      Passed - Completed PHQ-2 or PHQ-9 in the last 360 days      Passed - Valid encounter within last 6 months    Recent Outpatient Visits          1 month ago Type 2 diabetes mellitus with hyperglycemia, without long-term current use of insulin [facility])   [facility] [place], Coralie Keens, NP   2 months ago Herpes zoster without complication   [facility] [place], Coralie Keens, NP   8 months ago Encounter for general adult medical examination with abnormal findings   [facility], Coralie Keens, NP      Future Appointments            In 2 months Hollice Espy, MD [place]

## 2022-05-20 ENCOUNTER — Other Ambulatory Visit: Payer: Self-pay

## 2022-05-20 ENCOUNTER — Encounter
Admission: RE | Admit: 2022-05-20 | Discharge: 2022-05-20 | Disposition: A | Discharge status: Home / Self Care | Payer: No Typology Code available for payment source | Source: Ambulatory Visit | Attending: Orthopedic Surgery | Admitting: Orthopedic Surgery

## 2022-05-20 VITALS — Ht 70.0 in | Wt 230.0 lb

## 2022-05-20 DIAGNOSIS — E119 Type 2 diabetes mellitus without complications: Secondary | ICD-10-CM

## 2022-05-20 DIAGNOSIS — E6609 Other obesity due to excess calories: Secondary | ICD-10-CM

## 2022-05-20 DIAGNOSIS — E782 Mixed hyperlipidemia: Secondary | ICD-10-CM

## 2022-05-20 DIAGNOSIS — I10 Essential (primary) hypertension: Secondary | ICD-10-CM

## 2022-05-20 HISTORY — DX: Essential (primary) hypertension: I10

## 2022-05-20 MED ORDER — ORAL CARE MOUTH RINSE: 15.0000 mL | Freq: Once | OROMUCOSAL | Status: AC

## 2022-05-20 MED ORDER — CEFAZOLIN SODIUM-DEXTROSE 2-4 GM/100ML-% IV SOLN: 2.0000 g | INTRAVENOUS | Status: DC

## 2022-05-20 MED ORDER — SODIUM CHLORIDE 0.9 % IV SOLN: INTRAVENOUS | Status: DC

## 2022-05-20 MED ORDER — CHLORHEXIDINE GLUCONATE 0.12 % MT SOLN: 15.0000 mL | Freq: Once | OROMUCOSAL | Status: AC

## 2022-05-20 NOTE — Patient Instructions (Addendum)
Your procedure is scheduled on: 05/21/22 Report to [facility]. To find out your arrival time please call [phone] between 1PM - 3PM on 05/20/22.  Remember: Instructions that are not followed completely may result in serious medical risk, up to and including death, or upon the discretion of your surgeon and anesthesiologist your surgery may need to be rescheduled.     _X__ 1. Do not eat food after midnight the night before your procedure.                 No gum chewing or hard candies. You may drink clear liquids up to 2 hours                 before you are scheduled to arrive for your surgery- Diabetics water only or G2 Gatorade  __X__2.  On the morning of surgery brush your teeth with toothpaste and water, you                 may rinse your mouth with mouthwash if you wish.  Do not swallow any              toothpaste of mouthwash.     _X__ 3.  No Alcohol for 24 hours before or after surgery.   _X__ 4.  Do Not Smoke or use e-cigarettes For 24 Hours Prior to Your Surgery.                 Do not use any chewable tobacco products for at least 6 hours prior to                 surgery.  ____  5.  Bring all medications with you on the day of surgery if instructed.   __X__  6.  Notify your doctor if there is any change in your medical condition      (cold, fever, infections).     Do not wear jewelry, make-up, hairpins, clips or nail polish. Do not wear lotions, powders, or perfumes. NO DEODORANT Shower prior to arrival for surgery Do not shave body hair 48 hours prior to surgery. Men may shave face and neck. Do not bring valuables to the hospital.    [facility] is not responsible for any belongings or valuables.  Contacts, dentures/partials or body piercings may not be worn into surgery. Bring a case for your contacts, glasses or hearing aids, a denture cup will be supplied. Leave your suitcase in the car. After surgery it may be  brought to your room. For patients admitted to the hospital, discharge time is determined by your treatment team.   Patients discharged the day of surgery will not be allowed to drive home.    __X__ Take these medicines the morning of surgery with A SIP OF WATER:    1. amLODipine (NORVASC) 10 MG tablet  2. buPROPion (WELLBUTRIN XL) 300 MG 24 hr tablet  3. citalopram (CELEXA) 40 MG tablet  4. ezetimibe (ZETIA) 10 MG tablet  5. omeprazole (PRILOSEC) 20 MG capsule  6.  ____ Fleet Enema (as directed)   ____ Use CHG Soap/SAGE wipes as directed  ____ Use inhalers on the day of surgery  ____ Stop metformin/Janumet/Farxiga 2 days prior to surgery    ____ Take 1/2 of usual insulin dose the night before surgery. No insulin the morning          of surgery.   ____ Stop Blood Thinners Coumadin/Plavix/Xarelto/Pleta/Pradaxa/Eliquis/Effient/Aspirin  on   Or contact your Surgeon, Cardiologist or Medical Doctor regarding  ability to stop your blood thinners  __X__ Stop Anti-inflammatories 7 days before surgery such as Advil, Ibuprofen, Motrin,  BC or Goodies Powder, Naprosyn, Naproxen, Aleve   __X__ Stop all herbals and supplements, fish oil or vitamins  until after surgery.    ____ Bring C-Pap to the hospital.    How to Use an Incentive Spirometer An incentive spirometer is a tool that measures how well you are filling your lungs with each breath. Learning to take long, deep breaths using this tool can help you keep your lungs clear and active. This may help to reverse or lessen your chance of developing breathing (pulmonary) problems, especially infection. You may be asked to use a spirometer: After a surgery. If you have a lung problem or a history of smoking. After a long period of time when you have been unable to move or be active. If the spirometer includes an indicator to show the highest number that you have reached, your health care provider or respiratory therapist will help you set  a goal. Keep a log of your progress as told by your health care provider. What are the risks? Breathing too quickly may cause dizziness or cause you to pass out. Take your time so you do not get dizzy or light-headed. If you are in pain, you may need to take pain medicine before doing incentive spirometry. It is harder to take a deep breath if you are having pain. How to use your incentive spirometer  Sit up on the edge of your bed or on a chair. Hold the incentive spirometer so that it is in an upright position. Before you use the spirometer, breathe out normally. Place the mouthpiece in your mouth. Make sure your lips are closed tightly around it. Breathe in slowly and as deeply as you can through your mouth, causing the piston or the ball to rise toward the top of the chamber. Hold your breath for 3-5 seconds, or for as long as possible. If the spirometer includes a coach indicator, use this to guide you in breathing. Slow down your breathing if the indicator goes above the marked areas. Remove the mouthpiece from your mouth and breathe out normally. The piston or ball will return to the bottom of the chamber. Rest for a few seconds, then repeat the steps 10 or more times. Take your time and take a few normal breaths between deep breaths so that you do not get dizzy or light-headed. Do this every 1-2 hours when you are awake. If the spirometer includes a goal marker to show the highest number you have reached (best effort), use this as a goal to work toward during each repetition. After each set of 10 deep breaths, cough a few times. This will help to make sure that your lungs are clear. If you have an incision on your chest or abdomen from surgery, place a pillow or a rolled-up towel firmly against the incision when you cough. This can help to reduce pain while taking deep breaths and coughing. General tips When you are able to get out of bed: Walk around often. Continue to take deep  breaths and cough in order to clear your lungs. Keep using the incentive spirometer until your health care provider says it is okay to stop using it. If you have been in the hospital, you may be told to keep using the spirometer at home. Contact a health  care provider if: You are having difficulty using the spirometer. You have trouble using the spirometer as often as instructed. Your pain medicine is not giving enough relief for you to use the spirometer as told. You have a fever. Get help right away if: You develop shortness of breath. You develop a cough with bloody mucus from the lungs. You have fluid or blood coming from an incision site after you cough. Summary An incentive spirometer is a tool that can help you learn to take long, deep breaths to keep your lungs clear and active. You may be asked to use a spirometer after a surgery, if you have a lung problem or a history of smoking, or if you have been inactive for a long period of time. Use your incentive spirometer as instructed every 1-2 hours while you are awake. If you have an incision on your chest or abdomen, place a pillow or a rolled-up towel firmly against your incision when you cough. This will help to reduce pain. Get help right away if you have shortness of breath, you cough up bloody mucus, or blood comes from your incision when you cough. This information is not intended to replace advice given to you by your health care provider. Make sure you discuss any questions you have with your health care provider. Document Revised: 12/24/2019 Document Reviewed: 12/24/2019 Elsevier Patient Education  [place].

## 2022-05-21 ENCOUNTER — Ambulatory Visit: Payer: No Typology Code available for payment source

## 2022-05-21 ENCOUNTER — Encounter
Admission: RE | Disposition: A | Discharge status: Home / Self Care | Payer: Self-pay | Source: Ambulatory Visit | Attending: Orthopedic Surgery

## 2022-05-21 ENCOUNTER — Ambulatory Visit: Payer: No Typology Code available for payment source | Admitting: Certified Registered"

## 2022-05-21 ENCOUNTER — Other Ambulatory Visit (HOSPITAL_COMMUNITY): Payer: Self-pay

## 2022-05-21 ENCOUNTER — Encounter: Payer: Self-pay | Admitting: Orthopedic Surgery

## 2022-05-21 ENCOUNTER — Other Ambulatory Visit: Payer: Self-pay

## 2022-05-21 ENCOUNTER — Ambulatory Visit
Admission: RE | Admit: 2022-05-21 | Discharge: 2022-05-21 | Disposition: A | Discharge status: Home / Self Care | Payer: No Typology Code available for payment source | Source: Ambulatory Visit | Attending: Orthopedic Surgery | Admitting: Orthopedic Surgery

## 2022-05-21 DIAGNOSIS — I10 Essential (primary) hypertension: Secondary | ICD-10-CM

## 2022-05-21 DIAGNOSIS — M25811 Other specified joint disorders, right shoulder: Secondary | ICD-10-CM | POA: Insufficient documentation

## 2022-05-21 DIAGNOSIS — E782 Mixed hyperlipidemia: Secondary | ICD-10-CM

## 2022-05-21 DIAGNOSIS — E119 Type 2 diabetes mellitus without complications: Secondary | ICD-10-CM | POA: Diagnosis not present

## 2022-05-21 DIAGNOSIS — M75101 Unspecified rotator cuff tear or rupture of right shoulder, not specified as traumatic: Secondary | ICD-10-CM | POA: Diagnosis not present

## 2022-05-21 DIAGNOSIS — M19011 Primary osteoarthritis, right shoulder: Secondary | ICD-10-CM | POA: Diagnosis not present

## 2022-05-21 DIAGNOSIS — Z6832 Body mass index (BMI) 32.0-32.9, adult: Secondary | ICD-10-CM

## 2022-05-21 HISTORY — PX: SHOULDER ARTHROSCOPY WITH ROTATOR CUFF REPAIR AND SUBACROMIAL DECOMPRESSION: SHX5686

## 2022-05-21 LAB — GLUCOSE, CAPILLARY
Glucose-Capillary: 146 mg/dL — ABNORMAL HIGH (ref 70–99)
Glucose-Capillary: 177 mg/dL — ABNORMAL HIGH (ref 70–99)

## 2022-05-21 SURGERY — SHOULDER ARTHROSCOPY WITH ROTATOR CUFF REPAIR AND SUBACROMIAL DECOMPRESSION
Anesthesia: General | Site: Shoulder | Laterality: Right

## 2022-05-21 MED ORDER — ONDANSETRON HCL 4 MG/2ML IJ SOLN: 4.0000 mg | Freq: Once | INTRAMUSCULAR | Status: DC | PRN

## 2022-05-21 MED ORDER — OXYCODONE HCL 5 MG PO TABS: ORAL_TABLET | ORAL | 0 refills | Status: DC

## 2022-05-21 MED ORDER — ROCURONIUM BROMIDE 100 MG/10ML IV SOLN
INTRAVENOUS | Status: DC | PRN
  Administered 2022-05-21: 60 mg via INTRAVENOUS
  Administered 2022-05-21: 10 mg via INTRAVENOUS
  Administered 2022-05-21: 20 mg via INTRAVENOUS
  Administered 2022-05-21 (×2): 10 mg via INTRAVENOUS

## 2022-05-21 MED ORDER — EPINEPHRINE PF 1 MG/ML IJ SOLN
INTRAMUSCULAR | Status: AC
Start: 2022-05-21 — End: ?
  Filled 2022-05-21: qty 4

## 2022-05-21 MED ORDER — BUPIVACAINE HCL (PF) 0.5 % IJ SOLN
INTRAMUSCULAR | Status: AC
  Filled 2022-05-21: qty 10

## 2022-05-21 MED ORDER — PHENYLEPHRINE 80 MCG/ML (10ML) SYRINGE FOR IV PUSH (FOR BLOOD PRESSURE SUPPORT)
PREFILLED_SYRINGE | INTRAVENOUS | Status: DC | PRN
  Administered 2022-05-21: 80 ug via INTRAVENOUS
  Administered 2022-05-21 (×2): 40 ug via INTRAVENOUS
  Administered 2022-05-21: 80 ug via INTRAVENOUS
  Administered 2022-05-21: 40 ug via INTRAVENOUS
  Administered 2022-05-21: 80 ug via INTRAVENOUS

## 2022-05-21 MED ORDER — OXYCODONE HCL 5 MG PO TABS: 5.0000 mg | ORAL_TABLET | ORAL | 0 refills | Status: DC | PRN

## 2022-05-21 MED ORDER — ACETAMINOPHEN 10 MG/ML IV SOLN
INTRAVENOUS | Status: DC | PRN
  Administered 2022-05-21: 1000 mg via INTRAVENOUS

## 2022-05-21 MED ORDER — MIDAZOLAM HCL 2 MG/2ML IJ SOLN
INTRAMUSCULAR | Status: AC
  Administered 2022-05-21: 2 mg via INTRAVENOUS
  Filled 2022-05-21: qty 2

## 2022-05-21 MED ORDER — MIDAZOLAM HCL 2 MG/2ML IJ SOLN
INTRAMUSCULAR | Status: AC
  Filled 2022-05-21: qty 2

## 2022-05-21 MED ORDER — ONDANSETRON 4 MG PO TBDP
4.0000 mg | ORAL_TABLET | Freq: Three times a day (TID) | ORAL | 0 refills | Status: DC | PRN
  Filled 2022-05-21: qty 20, 7d supply, fill #0

## 2022-05-21 MED ORDER — LIDOCAINE HCL (CARDIAC) PF 100 MG/5ML IV SOSY
PREFILLED_SYRINGE | INTRAVENOUS | Status: DC | PRN
  Administered 2022-05-21: 50 mg via INTRAVENOUS

## 2022-05-21 MED ORDER — FENTANYL CITRATE PF 50 MCG/ML IJ SOSY: 50.0000 ug | PREFILLED_SYRINGE | Freq: Once | INTRAMUSCULAR | Status: AC

## 2022-05-21 MED ORDER — MIDAZOLAM HCL 2 MG/2ML IJ SOLN: 2.0000 mg | Freq: Once | INTRAMUSCULAR | Status: AC

## 2022-05-21 MED ORDER — FENTANYL CITRATE (PF) 100 MCG/2ML IJ SOLN: 25.0000 ug | INTRAMUSCULAR | Status: DC | PRN

## 2022-05-21 MED ORDER — ACETAMINOPHEN 500 MG PO TABS: 1000.0000 mg | ORAL_TABLET | Freq: Three times a day (TID) | ORAL | 2 refills | Status: AC

## 2022-05-21 MED ORDER — LACTATED RINGERS IV SOLN
INTRAVENOUS | Status: DC | PRN
  Administered 2022-05-21 (×2): 6002 mL

## 2022-05-21 MED ORDER — KETAMINE HCL 50 MG/5ML IJ SOSY
PREFILLED_SYRINGE | INTRAMUSCULAR | Status: AC
  Filled 2022-05-21: qty 5

## 2022-05-21 MED ORDER — PHENYLEPHRINE 80 MCG/ML (10ML) SYRINGE FOR IV PUSH (FOR BLOOD PRESSURE SUPPORT)
PREFILLED_SYRINGE | INTRAVENOUS | Status: AC
  Filled 2022-05-21: qty 10

## 2022-05-21 MED ORDER — ONDANSETRON 4 MG PO TBDP: 4.0000 mg | ORAL_TABLET | Freq: Three times a day (TID) | ORAL | 0 refills | Status: DC | PRN

## 2022-05-21 MED ORDER — BUPIVACAINE HCL (PF) 0.5 % IJ SOLN
INTRAMUSCULAR | Status: AC
  Filled 2022-05-21: qty 30

## 2022-05-21 MED ORDER — PHENYLEPHRINE HCL (PRESSORS) 10 MG/ML IV SOLN
INTRAVENOUS | Status: AC
  Filled 2022-05-21: qty 1

## 2022-05-21 MED ORDER — BUPIVACAINE LIPOSOME 1.3 % IJ SUSP
INTRAMUSCULAR | Status: DC | PRN
  Administered 2022-05-21: 10 mL via PERINEURAL

## 2022-05-21 MED ORDER — BUPIVACAINE HCL (PF) 0.5 % IJ SOLN
INTRAMUSCULAR | Status: DC | PRN
  Administered 2022-05-21: 10 mL via PERINEURAL

## 2022-05-21 MED ORDER — OXYCODONE HCL 5 MG/5ML PO SOLN: 5.0000 mg | Freq: Once | ORAL | Status: DC | PRN

## 2022-05-21 MED ORDER — RINGERS IRRIGATION IR SOLN
Status: DC | PRN
  Administered 2022-05-21: 9000 mL
  Administered 2022-05-21: 3000 mL
  Administered 2022-05-21: 6000 mL
  Administered 2022-05-21 (×3): 3000 mL

## 2022-05-21 MED ORDER — SUGAMMADEX SODIUM 200 MG/2ML IV SOLN
INTRAVENOUS | Status: DC | PRN
  Administered 2022-05-21: 200 mg via INTRAVENOUS

## 2022-05-21 MED ORDER — ACETAMINOPHEN 10 MG/ML IV SOLN: 1000.0000 mg | Freq: Once | INTRAVENOUS | Status: DC | PRN

## 2022-05-21 MED ORDER — PHENYLEPHRINE HCL-NACL 20-0.9 MG/250ML-% IV SOLN
INTRAVENOUS | Status: DC | PRN
  Administered 2022-05-21: 20 ug/min via INTRAVENOUS

## 2022-05-21 MED ORDER — KETAMINE HCL 10 MG/ML IJ SOLN
INTRAMUSCULAR | Status: DC | PRN
  Administered 2022-05-21: 20 mg via INTRAVENOUS
  Administered 2022-05-21: 30 mg via INTRAVENOUS

## 2022-05-21 MED ORDER — CHLORHEXIDINE GLUCONATE 0.12 % MT SOLN
OROMUCOSAL | Status: AC
  Administered 2022-05-21: 15 mL via OROMUCOSAL
  Filled 2022-05-21: qty 15

## 2022-05-21 MED ORDER — ASPIRIN 325 MG PO TBEC: 325.0000 mg | DELAYED_RELEASE_TABLET | Freq: Every day | ORAL | 0 refills | Status: AC

## 2022-05-21 MED ORDER — GLYCOPYRROLATE 0.2 MG/ML IJ SOLN
INTRAMUSCULAR | Status: DC | PRN
  Administered 2022-05-21: .2 mg via INTRAVENOUS

## 2022-05-21 MED ORDER — FENTANYL CITRATE (PF) 100 MCG/2ML IJ SOLN
INTRAMUSCULAR | Status: AC
  Filled 2022-05-21: qty 2

## 2022-05-21 MED ORDER — CEFAZOLIN SODIUM-DEXTROSE 2-4 GM/100ML-% IV SOLN
INTRAVENOUS | Status: AC
  Filled 2022-05-21: qty 100

## 2022-05-21 MED ORDER — OXYCODONE HCL 5 MG PO TABS
5.0000 mg | ORAL_TABLET | ORAL | 0 refills | Status: DC | PRN
  Filled 2022-05-21: qty 30, 3d supply, fill #0

## 2022-05-21 MED ORDER — DEXAMETHASONE SODIUM PHOSPHATE 10 MG/ML IJ SOLN
INTRAMUSCULAR | Status: DC | PRN
  Administered 2022-05-21: 10 mg via INTRAVENOUS

## 2022-05-21 MED ORDER — ONDANSETRON HCL 4 MG/2ML IJ SOLN
INTRAMUSCULAR | Status: DC | PRN
  Administered 2022-05-21: 4 mg via INTRAVENOUS

## 2022-05-21 MED ORDER — PROPOFOL 10 MG/ML IV BOLUS
INTRAVENOUS | Status: AC
  Filled 2022-05-21: qty 20

## 2022-05-21 MED ORDER — FENTANYL CITRATE PF 50 MCG/ML IJ SOSY
PREFILLED_SYRINGE | INTRAMUSCULAR | Status: AC
  Administered 2022-05-21: 50 ug via INTRAVENOUS
  Filled 2022-05-21: qty 1

## 2022-05-21 MED ORDER — OXYCODONE HCL 5 MG PO TABS: 5.0000 mg | ORAL_TABLET | Freq: Once | ORAL | Status: DC | PRN

## 2022-05-21 MED ORDER — ACETAMINOPHEN 10 MG/ML IV SOLN
INTRAVENOUS | Status: AC
Start: 2022-05-21 — End: ?
  Filled 2022-05-21: qty 100

## 2022-05-21 MED ORDER — BUPIVACAINE LIPOSOME 1.3 % IJ SUSP
INTRAMUSCULAR | Status: AC
  Filled 2022-05-21: qty 10

## 2022-05-21 MED ORDER — PROPOFOL 10 MG/ML IV BOLUS
INTRAVENOUS | Status: DC | PRN
  Administered 2022-05-21: 200 mg via INTRAVENOUS

## 2022-05-21 SURGICAL SUPPLY — 83 items
ADAPTER IRRIG TUBE 2 SPIKE SOL (ADAPTER) ×3 IMPLANT
ADH SKN CLS APL DERMABOND .7 (GAUZE/BANDAGES/DRESSINGS)
ADPR TBG 2 SPK PMP STRL ASCP (ADAPTER) ×1
ANCH SUT 2 SWLK 19.1 CLS EYLT (Anchor) ×2 IMPLANT
ANCH SUT 2.9 PUSHLOCK ANCH (Orthopedic Implant) ×1 IMPLANT
ANCH SUT 2X2.3 2 STRN TPE (Anchor) ×2 IMPLANT
ANCHOR ICONIX SPEED 2.0 TAPE (Anchor) ×2 IMPLANT
ANCHOR SWIVELOCK BIO 4.75X19.1 (Anchor) ×3 IMPLANT
APL PRP STRL LF DISP 70% ISPRP (MISCELLANEOUS) ×1
BLADE SHAVER 4.5X7 STR FR (MISCELLANEOUS) ×2 IMPLANT
BNDG ADH 2 X3.75 FABRIC TAN LF (GAUZE/BANDAGES/DRESSINGS) ×2 IMPLANT
BNDG ADH XL 3.75X2 STRCH LF (GAUZE/BANDAGES/DRESSINGS) ×1
BUR BR 5.5 WIDE MOUTH (BURR) ×1 IMPLANT
CANNULA PART THRD DISP 5.75X7 (CANNULA) ×1 IMPLANT
CANNULA PARTIAL THREAD 2X7 (CANNULA) IMPLANT
CANNULA TWIST IN 8.25X9CM (CANNULA) ×1 IMPLANT
CHLORAPREP W/TINT 26 (MISCELLANEOUS) ×2 IMPLANT
COOLER POLAR GLACIER W/PUMP (MISCELLANEOUS) ×2 IMPLANT
DERMABOND ADVANCED (GAUZE/BANDAGES/DRESSINGS)
DERMABOND ADVANCED .7 DNX12 (GAUZE/BANDAGES/DRESSINGS) IMPLANT
DRAPE 3/4 80X56 (DRAPES) ×2 IMPLANT
DRAPE INCISE IOBAN 66X45 STRL (DRAPES) ×2 IMPLANT
DRAPE U-SHAPE 47X51 STRL (DRAPES) ×4 IMPLANT
DRSG TEGADERM 4X4.75 (GAUZE/BANDAGES/DRESSINGS) ×7 IMPLANT
ELECT REM PT RETURN 9FT ADLT (ELECTROSURGICAL) ×2
ELECTRODE REM PT RTRN 9FT ADLT (ELECTROSURGICAL) ×1 IMPLANT
GAUZE SPONGE 4X4 12PLY STRL (GAUZE/BANDAGES/DRESSINGS) ×2 IMPLANT
GAUZE XEROFORM 1X8 LF (GAUZE/BANDAGES/DRESSINGS) ×2 IMPLANT
GLOVE BIO SURGEON STRL SZ7.5 (GLOVE) ×2 IMPLANT
GLOVE BIOGEL PI IND STRL 8 (GLOVE) ×2 IMPLANT
GLOVE BIOGEL PI INDICATOR 8 (GLOVE) ×3
GLOVE SURG ORTHO 8.0 STRL STRW (GLOVE) ×2 IMPLANT
GLOVE SURG SYN 8.0 (GLOVE) ×2 IMPLANT
GLOVE SURG SYN 8.0 PF PI (GLOVE) ×1 IMPLANT
GOWN STRL REUS W/ TWL LRG LVL3 (GOWN DISPOSABLE) ×2 IMPLANT
GOWN STRL REUS W/TWL LRG LVL3 (GOWN DISPOSABLE) ×4
GOWN STRL REUS W/TWL XL LVL4 (GOWN DISPOSABLE) ×2 IMPLANT
IV LACTATED RINGER IRRG 3000ML (IV SOLUTION) ×24
IV LR IRRIG 3000ML ARTHROMATIC (IV SOLUTION) ×4 IMPLANT
KIT CORKSCREW KNTLS 3.9 S/T/P (INSTRUMENTS) IMPLANT
KIT STABILIZATION SHOULDER (MISCELLANEOUS) ×2 IMPLANT
KIT SUTURETAK 3.0 INSERT PERC (KITS) IMPLANT
KIT TURNOVER KIT A (KITS) ×2 IMPLANT
MANIFOLD NEPTUNE II (INSTRUMENTS) ×4 IMPLANT
MASK FACE SPIDER DISP (MASK) ×2 IMPLANT
MAT ABSORB  FLUID 56X50 GRAY (MISCELLANEOUS) ×2
MAT ABSORB FLUID 56X50 GRAY (MISCELLANEOUS) ×2 IMPLANT
NDL MAYO CATGUT SZ5 (NEEDLE)
NDL SAFETY ECLIPSE 18X1.5 (NEEDLE) ×1 IMPLANT
NDL SCORPION MULTI FIRE (NEEDLE) IMPLANT
NDL SUT 5 .5 CRC TPR PNT MAYO (NEEDLE) IMPLANT
NEEDLE HYPO 18GX1.5 SHARP (NEEDLE) ×2
NEEDLE SCORPION MULTI FIRE (NEEDLE) IMPLANT
PACK ARTHROSCOPY SHOULDER (MISCELLANEOUS) ×2 IMPLANT
PAD ARMBOARD 7.5X6 YLW CONV (MISCELLANEOUS) ×2 IMPLANT
PAD WRAPON POLAR SHDR XLG (MISCELLANEOUS) ×1 IMPLANT
PASSER SUT FIRSTPASS SELF (INSTRUMENTS) ×2 IMPLANT
PASSER SUT SWIFTSTITCH HIP CRT (INSTRUMENTS) ×2 IMPLANT
SHAVER BLADE BONE CUTTER  5.5 (BLADE) ×1
SHAVER BLADE BONE CUTTER 5.5 (BLADE) ×1 IMPLANT
SLING ULTRA II M (MISCELLANEOUS) ×2 IMPLANT
SPONGE T-LAP 18X18 ~~LOC~~+RFID (SPONGE) ×2 IMPLANT
STAPLER SKIN PROX 35W (STAPLE) IMPLANT
STRAP SAFETY 5IN WIDE (MISCELLANEOUS) ×2 IMPLANT
SUT ETHILON 3 0 PS 1 (SUTURE) ×1 IMPLANT
SUT ETHILON 3-0 (SUTURE) ×2 IMPLANT
SUT LASSO 90 DEG CVD (SUTURE) IMPLANT
SUT LASSO 90 DEG SD STR (SUTURE) IMPLANT
SUT MNCRL 4-0 (SUTURE)
SUT MNCRL 4-0 27XMFL (SUTURE)
SUT PROLENE 0 CT 2 (SUTURE) IMPLANT
SUT VIC AB 0 CT1 36 (SUTURE) IMPLANT
SUT VIC AB 2-0 CT2 27 (SUTURE) IMPLANT
SUTURE MNCRL 4-0 27XMF (SUTURE) IMPLANT
SYSTEM IMPL TENODESIS LNT 2.9 (Orthopedic Implant) ×1 IMPLANT
TAPE CLOTH 3X10 WHT NS LF (GAUZE/BANDAGES/DRESSINGS) ×2 IMPLANT
TAPE MICROFOAM 4IN (TAPE) ×2 IMPLANT
TUBING CONNECTING 10 (TUBING) IMPLANT
TUBING INFLOW SET DBFLO PUMP (TUBING) ×2 IMPLANT
TUBING OUTFLOW SET DBLFO PUMP (TUBING) ×2 IMPLANT
WAND WEREWOLF FLOW 90D (MISCELLANEOUS) ×2 IMPLANT
WATER STERILE IRR 500ML POUR (IV SOLUTION) ×2 IMPLANT
WRAPON POLAR PAD SHDR XLG (MISCELLANEOUS) ×2

## 2022-05-21 NOTE — Op Note (Addendum)
SURGERY DATE: 05/21/2022   PRE-OP DIAGNOSIS:  1. Right subacromial impingement 2. Right biceps tendinopathy 3. Right rotator cuff tear 4. Right acromioclavicular joint arthritis  POST-OP DIAGNOSIS: 1. Right subacromial impingement 2. Right biceps tendinopathy and partial-thickness tearing 3. Right rotator cuff tear 4. Right acromioclavicular joint arthritis  PROCEDURES:  1. Right arthroscopic rotator cuff repair (delaminated supraspinatus) 2. Right arthroscopic biceps tenodesis 3. Right arthroscopic subacromial decompression 4. Right extensive debridement of shoulder (glenohumeral and subacromial spaces) 5. Right arthroscopic distal clavicle excision  SURGEON: Cato Mulligan, MD  ASSISTANT: Anitra Lauth, PA  ANESTHESIA: Gen with Exparel interscalene block  ESTIMATED BLOOD LOSS: 10cc  DRAINS:  none  TOTAL IV FLUIDS: per anesthesia   SPECIMENS: none  IMPLANTS:   - [employer] Iconix SPEED 2.49m Anchor w/double loaded tape - x2 - Arthrex 4.769mSwiveLock - x2 - Arthrex -2.9 mm push lock anchor x1  OPERATIVE FINDINGS:  Examination under anesthesia: A careful examination under anesthesia was performed.  Passive range of motion was: FF: 160; ER at side: 50; ER in abduction: NT; IR in abduction: NT.  Anterior load shift: NT.  Posterior load shift: NT.  Sulcus in neutral: NT.  Sulcus in ER: NT.    ntra-operative findings: A thorough arthroscopic examination of the shoulder was performed.  The findings are: 1. Biceps tendon: tendinopathy with significant erythema and split thickness tearing 2. Superior labrum: erythema 3. Posterior labrum and capsule: normal 4. Inferior capsule and inferior recess: normal 5. Glenoid cartilage surface: Normal 6. Supraspinatus attachment: full-thickness tear of the supraspinatus with delamination 7. Posterior rotator cuff attachment: normal 8. Humeral head articular cartilage: normal 9. Rotator interval: significant synovitis 10: Subscapularis  tendon: attachment intact 11. Anterior labrum: Mildly degenerative 12. IGHL: normal   OPERATIVE REPORT:    Indications for procedure:  Marc Aguilar a [age].o. male with over 1 year of right shoulder pain. Clinical exam and MRI were suggestive of rotator cuff tear, biceps tendinopathy, and subacromial impingement.  He has failed extensive nonoperative management.  After discussion of risks, benefits, and alternatives to surgery, the patient elected to proceed.    Procedure in detail:   I identified Marc Aguilar the pre-operative holding area.  I marked the operative shoulder with my initials. I reviewed the risks and benefits of the proposed surgical intervention, and the patient wished to proceed.  Anesthesia was then performed with an Exparel interscalene block.  The patient was transferred to the operative suite and placed in the beach chair position.     Appropriate IV antibiotics were administered prior to incision. The operative upper extremity was then prepped and draped in standard fashion. A time out was performed confirming the correct extremity, correct patient, and correct procedure.    I then created a standard posterior portal with an 11 blade. The glenohumeral joint was easily entered with a blunt trocar and the arthroscope introduced. The findings of diagnostic arthroscopy are described above. I debrided degenerative tissue including the synovitic tissue about the rotator interval and anterior and superior labrum. I then coagulated the inflamed synovium to obtain hemostasis and reduce the risk of post-operative swelling using an Arthrocare radiofrequency device.   I then turned my attention to the arthroscopic biceps tenodesis. The Loop n Tack technique was used to pass a FiberTape through the biceps in a locked fashion adjacent to the biceps anchor.  A hole for a 2.9 mm Arthrex PushLock was drilled in the bicipital groove just superior to the  subscapularis tendon insertion.   The biceps tendon was then cut and the biceps anchor complex was debrided down to a stable base on the superior labrum.  The FiberTape was loaded onto the PushLock anchor and impacted into place into the previously drilled hole in the bicipital groove.  This appropriately secured the biceps into the bicipital groove and took it off of tension.   Next, the arthroscope was then introduced into the subacromial space. A direct lateral portal was created with an 11-blade after spinal needle localization. An extensive subacromial bursectomy and debridement was performed using a combination of the shaver and Arthrocare wand. The entire acromial undersurface was exposed and the CA ligament was subperiosteally elevated to expose the anterior acromial hook. A burr was used to create a flat anterior and lateral aspect of the acromion, converting it from a Type 2 to a Type 1 acromion. Care was made to keep the deltoid fascia intact.   I then turned my attention to the arthroscopic distal clavicle excision. I identified the acromioclavicular joint. Surrounding bursal tissue was debrided and the edges of the joint were identified. I used the 5.46m barrel burr to remove the distal clavicle parallel to the edge of the acromion. I was able to fit two widths of the burr into the space between the distal clavicle and acromion, signifying that I had removed ~136mof distal clavicle. This was confirmed by viewing anteriorly and introducing a probe with measuring marks from the lateral portal. Hemostasis was achieved with an Arthrocare wand. Fluid was evacuated from the shoulder.   Next I created accessory posterolateral and anterolateral portals to assist with visualization and instrumentation.  I debrided the poor quality edges of the supraspinatus tendon.  This was a U-shaped tear of the supraspinatus with delamination.  Inferior portion of the supraspinatus was able to reach the articular margin, and superior portion was able  to cover the entirety of the rotator cuff footprint.  I prepared the footprint using a burr to expose bleeding bone.    I then percutaneously placed 1 Iconix SPEED medial row anchor along the anterior portion of the tear at the articular margin. Another SPEED anchor was placed along the posterior portion of the tear at the articular margin. I then shuttled all 8 strands of tape through the rotator cuff just lateral to the musculotendinous junction using a FirstPass suture passer spanning the anterior to posterior extent of the tear.  4 of the strands were passed through both inferior and superior delaminated portions and the other 4 strands were passed only through the superior portion.  The strands passed through both leaflets were then passed through an [employer]chor.  This was placed approximately 2 cm distal to the lateral edge of the footprint in line with the posterior aspect of the tear with appropriate tensioning of each suture prior to final fixation.  Similarly, the passed through the superior leaflet were then passed through another SwiveLock anchor along the anterior margin of the tear.  The knotless mechanism from the anterior lateral row anchor was utilized to reduce an anterior dogear.  This construct allowed for excellent reapproximation of the rotator cuff to its native footprint without undue tension.  Appropriate compression was achieved.  The repair was stable to external and internal rotation.   Fluid was evacuated from the shoulder, and the portals were closed with 3-0 Nylon.  Xeroform was applied to the portals. A sterile dressing was applied, followed by a Polar Care sleeve and  a SlingShot shoulder immobilizer/sling. The patient was awakened from anesthesia without difficulty and was transferred to the PACU in stable condition.    Additionally, this case had increased complexity compared to standard arthroscopic rotator cuff repair given the delaminated nature of the tear.  This required multiple additional suture passes and different repair configuration compared to standard rotator cuff repair. Repair of this tear increased surgical time by ~45 minutes and added to complexity. Additionally, multiple accessory portals were utilized for visualization and suture management.   Of note, assistance from a PA was essential to performing the surgery.  PA was present for the entire surgery.  PA assisted with patient positioning, retraction, instrumentation, and wound closure. The surgery would have been more difficult and had longer operative time without PA assistance.   COMPLICATIONS: none   DISPOSITION: plan for discharge home after recovery in PACU     POSTOPERATIVE PLAN: Remain in sling (except hygiene and elbow/wrist/hand RoM exercises as instructed by PT) x 6 weeks and NWB for this time. PT to begin 3-4 days after surgery.  Large rotator cuff repair rehab protocol. ASA '325mg'$  daily x 2 weeks for DVT ppx.

## 2022-05-21 NOTE — H&P (Signed)
Paper H&P to be scanned into permanent record. H&P reviewed. No significant changes noted.  

## 2022-05-21 NOTE — Anesthesia Postprocedure Evaluation (Signed)
Anesthesia Post Note  Patient: RAJON BISIG  Procedure(s) Performed: Right shoulder arthroscopic rotator cuff repair (supraspinatus & subscapularis), subacromial decompression, and biceps tenodesis (Right: Shoulder)  Patient location during evaluation: PACU Anesthesia Type: General Level of consciousness: awake and alert Pain management: pain level controlled Vital Signs Assessment: post-procedure vital signs reviewed and stable Respiratory status: spontaneous breathing, nonlabored ventilation, respiratory function stable and patient connected to nasal cannula oxygen Cardiovascular status: blood pressure returned to baseline and stable Postop Assessment: no apparent nausea or vomiting Anesthetic complications: no   No notable events documented.   Last Vitals:  Vitals:   05/21/22 1200 05/21/22 1230  BP: 136/87 (!) 153/78  Pulse: 86 92  Resp: (!) 23 15  Temp:  (!) 36.2 C  SpO2: 92% 94%    Last Pain:  Vitals:   05/21/22 1230  TempSrc:   PainSc: 0-No pain                 Arita Miss

## 2022-05-21 NOTE — Anesthesia Procedure Notes (Signed)
Anesthesia Regional Block: Interscalene brachial plexus block  ? ?Pre-Anesthetic Checklist: , timeout performed,  Correct Patient, Correct Site, Correct Laterality,  Correct Procedure, Correct Position, site marked,  Risks and benefits discussed,  Surgical consent,  Pre-op evaluation,  At surgeon's request and post-op pain management ? ?Laterality: Right ? ?Prep: chloraprep     ?  ?Needles:  ?Injection technique: Single-shot ? ?Needle Type: Echogenic Needle   ? ? ?Needle Length: 4cm  ?Needle Gauge: 25  ? ? ? ?Additional Needles: ? ? ?Procedures:,,,, ultrasound used (permanent image in chart),,    ?Narrative:  ?Injection made incrementally with aspirations every 5 mL. ? ?Performed by: Personally  ?Anesthesiologist: Massiah Longanecker, MD ? ?Additional Notes: ?Patient's chart reviewed and they were deemed appropriate candidate for procedure, at surgeon's request. Patient educated about risks, benefits, and alternatives of the block including but not limited to: temporary or permanent nerve damage, bleeding, infection, damage to surround tissues, pneumothorax, hemidiaphragmatic paralysis, unilateral Horner's syndrome, block failure, local anesthetic toxicity. Patient expressed understanding. A formal time-out was conducted consistent with institution rules. ? ?Monitors were applied, and minimal sedation used (see nursing record). The site was prepped with skin prep and allowed to dry, and sterile gloves were used. A high frequency linear ultrasound probe with probe cover was utilized throughout. C5-7 nerve roots located and appeared anatomically normal, local anesthetic injected around them, and echogenic block needle trajectory was monitored throughout. Aspiration performed every 5ml. Lung and blood vessels were avoided. All injections were performed without resistance and free of blood and paresthesias. The patient tolerated the procedure well. ? ?Injectate: 10ml exparel + 10ml 0.5% bupivacaine  ? ? ? ?

## 2022-05-21 NOTE — Anesthesia Procedure Notes (Signed)
Procedure Name: Intubation Date/Time: 05/21/2022 7:47 AM  Performed by: Biagio Borg, CRNAPre-anesthesia Checklist: Patient identified, Emergency Drugs available, Suction available and Patient being monitored Patient Re-evaluated:Patient Re-evaluated prior to induction Oxygen Delivery Method: Circle system utilized Preoxygenation: Pre-oxygenation with 100% oxygen Induction Type: IV induction Ventilation: Mask ventilation without difficulty Laryngoscope Size: McGraph and 4 Grade View: Grade I Tube type: Oral Tube size: 7.0 mm Number of attempts: 1 Airway Equipment and Method: Stylet Placement Confirmation: ETT inserted through vocal cords under direct vision, positive ETCO2 and breath sounds checked- equal and bilateral Secured at: 22 cm Tube secured with: Tape Dental Injury: Teeth and Oropharynx as per pre-operative assessment

## 2022-05-21 NOTE — Anesthesia Preprocedure Evaluation (Signed)
Anesthesia Evaluation  Patient identified by MRN, date of birth, ID band Patient awake    Reviewed: Allergy & Precautions, NPO status , Patient's Chart, lab work & pertinent test results  History of Anesthesia Complications Negative for: history of anesthetic complications  Airway Mallampati: II  TM Distance: >3 FB Neck ROM: Full    Dental no notable dental hx. (+) Teeth Intact   Pulmonary sleep apnea and Continuous Positive Airway Pressure Ventilation , neg COPD, Patient abstained from smoking.Not current smoker, former smoker,    Pulmonary exam normal breath sounds clear to auscultation       Cardiovascular Exercise Tolerance: Good METShypertension, Pt. on medications (-) CAD and (-) Past MI (-) dysrhythmias  Rhythm:Regular Rate:Normal - Systolic murmurs TTE 3762 unremarkable   Neuro/Psych  Headaches, PSYCHIATRIC DISORDERS Anxiety Depression    GI/Hepatic GERD  Controlled,(+)     (-) substance abuse  ,   Endo/Other  diabetes, Well Controlled  Renal/GU negative Renal ROS     Musculoskeletal   Abdominal   Peds  Hematology   Anesthesia Other Findings Past Medical History: No date: Anxiety No date: Arthritis     Comment:  "knees and hips" (02/28/2018) No date: Cancer ([place])     Comment:  prostate  No date: Chicken pox No date: Depression No date: GERD (gastroesophageal reflux disease) No date: Heart murmur No date: Hernia, abdominal No date: High cholesterol No date: History of gout No date: Hypertension No date: Migraine     Comment:  "probably monthly" (02/28/2018) No date: Neck pain No date: OSA on CPAP No date: Pneumonia     Comment:  "once" (02/28/2018) No date: Refusal of blood transfusions as patient is Jehovah's Witness No date: Seasonal allergies No date: Type II diabetes mellitus ([place])  Reproductive/Obstetrics                             Anesthesia Physical Anesthesia  Plan  ASA: 2  Anesthesia Plan: General   Post-op Pain Management: Regional block* and Ofirmev IV (intra-op)*   Induction: Intravenous  PONV Risk Score and Plan: 2 and Ondansetron, Dexamethasone and Midazolam  Airway Management Planned: Oral ETT  Additional Equipment: None  Intra-op Plan:   Post-operative Plan: Extubation in OR  Informed Consent: I have reviewed the patients History and Physical, chart, labs and discussed the procedure including the risks, benefits and alternatives for the proposed anesthesia with the patient or authorized representative who has indicated his/her understanding and acceptance.     Dental advisory given  Plan Discussed with: CRNA and Surgeon  Anesthesia Plan Comments: (Discussed risks of anesthesia with patient, including PONV, sore throat, lip/dental/eye damage. Rare risks discussed as well, such as cardiorespiratory and neurological sequelae, and allergic reactions. Discussed the role of CRNA in patient's perioperative care. Patient understands. Discussed r/b/a of interscalene block, including elective nature. Risks discussed: - Rare: bleeding, infection, nerve damage - shortness of breath from hemidiaphragmatic paralysis - unilateral horner's syndrome - poor/non-working blocks - reactions and toxicity to local anesthetic Patient understands and agrees. )        Anesthesia Quick Evaluation

## 2022-05-21 NOTE — Transfer of Care (Signed)
Immediate Anesthesia Transfer of Care Note  Patient: Marc Aguilar  Procedure(s) Performed: Right shoulder arthroscopic rotator cuff repair (supraspinatus & subscapularis), subacromial decompression, and biceps tenodesis (Right: Shoulder)  Patient Location: PACU  Anesthesia Type:General  Level of Consciousness: awake, alert  and oriented  Airway & Oxygen Therapy: Patient Spontanous Breathing  Post-op Assessment: Report given to RN and Post -op Vital signs reviewed and stable  Post vital signs: Reviewed and stable  Last Vitals:  Vitals Value Taken Time  BP 132/78 05/21/22 1152  Temp 35.6 C 05/21/22 1152  Pulse 82 05/21/22 1153  Resp 23 05/21/22 1153  SpO2 94 % 05/21/22 1153  Vitals shown include unvalidated device data.  Last Pain:  Vitals:   05/21/22 0649  TempSrc: Oral  PainSc: 0-No pain      Patients Stated Pain Goal: 0 (11/28/13 5208)  Complications: No notable events documented.

## 2022-05-21 NOTE — Discharge Instructions (Addendum)
Post-Op Instructions - Rotator Cuff Repair  1. Bracing: You will wear a shoulder immobilizer or sling for 6 weeks.   2. Driving: No driving for 3 weeks post-op. When driving, do not wear the immobilizer. Ideally, we recommend no driving for 6 weeks while sling is in place as one arm will be immobilized.   3. Activity: No active lifting for 2 months. Wrist, hand, and elbow motion only. Avoid lifting the upper arm away from the body except for hygiene. You are permitted to bend and straighten the elbow passively only (no active elbow motion). You may use your hand and wrist for typing, writing, and managing utensils (cutting food). Do not lift more than a coffee cup for 8 weeks.  When sleeping or resting, inclined positions (recliner chair or wedge pillow) and a pillow under the forearm for support may provide better comfort for up to 4 weeks.  Avoid long distance travel for 4 weeks.  Return to normal activities after rotator cuff repair repair normally takes 6 months on average. If rehab goes very well, may be able to do most activities at 4 months, except overhead or contact sports.  4. Physical Therapy: Begins 3-4 days after surgery, and proceed 1 time per week for the first 6 weeks, then 1-2 times per week from weeks 6-20 post-op.  5. Medications:  - You will be provided a prescription for narcotic pain medicine. After surgery, take 1-2 narcotic tablets every 4 hours if needed for severe pain.  - A prescription for anti-nausea medication will be provided in case the narcotic medicine causes nausea - take 1 tablet every 6 hours only if nauseated.   - Take tylenol 1000 mg (2 Extra Strength tablets or 3 regular strength) every 8 hours for pain.  May decrease or stop tylenol 5 days after surgery if you are having minimal pain. - Take ASA 325mg/day x 2 weeks to help prevent DVTs/PEs (blood clots).  - DO NOT take ANY nonsteroidal anti-inflammatory pain medications (Advil, Motrin, Ibuprofen, Aleve,  Naproxen, or Naprosyn). These medicines can inhibit healing of your shoulder repair.    If you are taking prescription medication for anxiety, depression, insomnia, muscle spasm, chronic pain, or for attention deficit disorder, you are advised that you are at a higher risk of adverse effects with use of narcotics post-op, including narcotic addiction/dependence, depressed breathing, death. If you use non-prescribed substances: alcohol, marijuana, cocaine, heroin, methamphetamines, etc., you are at a higher risk of adverse effects with use of narcotics post-op, including narcotic addiction/dependence, depressed breathing, death. You are advised that taking > 50 morphine milligram equivalents (MME) of narcotic pain medication per day results in twice the risk of overdose or death. For your prescription provided: oxycodone 5 mg - taking more than 6 tablets per day would result in > 50 morphine milligram equivalents (MME) of narcotic pain medication. Be advised that we will prescribe narcotics short-term, for acute post-operative pain only - 3 weeks for major operations such as shoulder repair/reconstruction surgeries.     6. Post-Op Appointment:  Your first post-op appointment will be 10-14 days post-op.  7. Work or School: For most, but not all procedures, we advise staying out of work or school for at least 1 to 2 weeks in order to recover from the stress of surgery and to allow time for healing.   If you need a work or school note this can be provided.   8. Smoking: If you are a smoker, you need to refrain from   smoking in the postoperative period. The nicotine in cigarettes will inhibit healing of your shoulder repair and decrease the chance of successful repair. Similarly, nicotine containing products (gum, patches) should be avoided.   Post-operative Brace: Apply and remove the brace you received as you were instructed to at the time of fitting and as described in detail as the brace's  instructions for use indicate.  Wear the brace for the period of time prescribed by your physician.  The brace can be cleaned with soap and water and allowed to air dry only.  Should the brace result in increased pain, decreased feeling (numbness/tingling), increased swelling or an overall worsening of your medical condition, please contact your doctor immediately.  If an emergency situation occurs as a result of wearing the brace after normal business hours, please dial 911 and seek immediate medical attention.  Let your doctor know if you have any further questions about the brace issued to you. Refer to the shoulder sling instructions for use if you have any questions regarding the correct fit of your shoulder sling.  [facility] for Troubleshooting: [phone]  Video that illustrates how to properly use a shoulder sling: "Instructions for Proper Use of an Orthopaedic Sling" [email or web address]   DO NOT [place] 4 DAYS   AMBULATORY SURGERY  DISCHARGE INSTRUCTIONS   The drugs that you were given will stay in your system until tomorrow so for the next 24 hours you should not:  Drive an automobile Make any legal decisions Drink any alcoholic beverage   You may resume regular meals tomorrow.  Today it is better to start with liquids and gradually work up to solid foods.  You may eat anything you prefer, but it is better to start with liquids, then soup and crackers, and gradually work up to solid foods.   Please notify your doctor immediately if you have any unusual bleeding, trouble breathing, redness and pain at the surgery site, drainage, fever, or pain not relieved by medication.    Additional Instructions:  Please contact your physician with any problems or Same Day Surgery at [phone], Monday through Friday 6 am to 4 pm, or [place] [facility] number at [phone]. POLAR CARE INFORMATION  [email or web address]  How to  use [place] Cold Therapy System?  YouTube   [email or web address]  OPERATING INSTRUCTIONS  Start the product With dry hands, connect the transformer to the electrical connection located on the top of the cooler. Next, plug the transformer into an appropriate electrical outlet. The unit will automatically start running at this point.  To stop the pump, disconnect electrical power.  Unplug to stop the product when not in use. Unplugging the Polar Care unit turns it off. Always unplug immediately after use. Never leave it plugged in while unattended. Remove pad.    FIRST ADD WATER TO FILL LINE, THEN ICE---Replace ice when existing ice is almost melted  1 Discuss Treatment with your [place] Practitioner and Use Only as Prescribed 2 Apply Insulation Barrier & Cold Therapy Pad 3 Check for Moisture 4 Inspect Skin Regularly  Tips and Trouble Shooting Usage Tips 1. Use cubed or chunked ice for optimal performance. 2. It is recommended to drain the Pad between uses. To drain the pad, hold the Pad upright with the hose pointed toward the ground. Depress the black plunger and allow water to drain out. 3. You may disconnect the Pad from the unit without removing the pad from the  affected area by depressing the silver tabs on the hose coupling and gently pulling the hoses apart. The Pad and unit will seal itself and will not leak. Note: Some dripping during release is normal. 4. DO NOT RUN PUMP WITHOUT WATER! The pump in this unit is designed to run with water. Running the unit without water will cause permanent damage to the pump. 5. Unplug unit before removing lid.  TROUBLESHOOTING GUIDE Pump not running, Water not flowing to the pad, Pad is not getting cold 1. Make sure the transformer is plugged into the wall outlet. 2. Confirm that the ice and water are filled to the indicated levels. 3. Make sure there are no kinks in the pad. 4. Gently pull on  the blue tube to make sure the tube/pad junction is straight. 5. Remove the pad from the treatment site and ll it while the pad is lying at; then reapply. 6. Confirm that the pad couplings are securely attached to the unit. Listen for the double clicks (Figure 1) to confirm the pad couplings are securely attached.  Leaks    Note: Some condensation on the lines, controller, and pads is unavoidable, especially in warmer climates. 1. If using a [facility] Polar Care Cold Therapy unit with a detachable Cold Therapy Pad, and a leak exists (other than condensation on the lines) disconnect the pad couplings. Make sure the silver tabs on the couplings are depressed before reconnecting the pad to the pump hose; then confirm both sides of the coupling are properly clicked in. 2. If the coupling continues to leak or a leak is detected in the pad itself, stop using it and call [facility] at [phone].  Cleaning After use, empty and dry the unit with a soft cloth. Warm water and mild detergent may be used occasionally to clean the pump and tubes.  WARNING: The [place] can be cold enough to cause serious injury, including full skin necrosis. Follow these Operating Instructions, and carefully read the Product Insert (see pouch on side of unit) and the Cold Therapy Pad Fitting Instructions (provided with each Cold Therapy Pad) prior to use.

## 2022-05-24 ENCOUNTER — Encounter: Payer: Self-pay | Admitting: Orthopedic Surgery

## 2022-06-14 ENCOUNTER — Other Ambulatory Visit: Payer: Self-pay | Admitting: Internal Medicine

## 2022-06-14 ENCOUNTER — Other Ambulatory Visit: Payer: Self-pay

## 2022-06-14 ENCOUNTER — Other Ambulatory Visit (HOSPITAL_COMMUNITY): Payer: Self-pay

## 2022-06-14 ENCOUNTER — Other Ambulatory Visit: Payer: Self-pay | Admitting: Family

## 2022-06-14 DIAGNOSIS — K219 Gastro-esophageal reflux disease without esophagitis: Secondary | ICD-10-CM

## 2022-06-15 ENCOUNTER — Other Ambulatory Visit (HOSPITAL_COMMUNITY): Payer: Self-pay

## 2022-06-15 ENCOUNTER — Other Ambulatory Visit: Payer: Self-pay

## 2022-06-15 ENCOUNTER — Encounter: Payer: Self-pay | Admitting: Student in an Organized Health Care Education/Training Program

## 2022-06-15 ENCOUNTER — Ambulatory Visit
Payer: No Typology Code available for payment source | Attending: Student in an Organized Health Care Education/Training Program | Admitting: Student in an Organized Health Care Education/Training Program

## 2022-06-15 VITALS — BP 138/99 | HR 98 | Temp 97.0°F | Ht 71.0 in | Wt 232.0 lb

## 2022-06-15 DIAGNOSIS — M5010 Cervical disc disorder with radiculopathy, unspecified cervical region: Secondary | ICD-10-CM | POA: Diagnosis present

## 2022-06-15 DIAGNOSIS — G894 Chronic pain syndrome: Secondary | ICD-10-CM | POA: Diagnosis present

## 2022-06-15 DIAGNOSIS — M5412 Radiculopathy, cervical region: Secondary | ICD-10-CM | POA: Insufficient documentation

## 2022-06-15 MED ORDER — BUPROPION HCL ER (XL) 300 MG PO TB24
300.0000 mg | ORAL_TABLET | Freq: Every day | ORAL | 0 refills | Status: DC
  Filled 2022-06-15: qty 90, 90d supply, fill #0

## 2022-06-15 MED ORDER — EZETIMIBE 10 MG PO TABS
10.0000 mg | ORAL_TABLET | Freq: Every day | ORAL | 0 refills | Status: DC
  Filled 2022-06-15: qty 90, 90d supply, fill #0

## 2022-06-15 MED ORDER — OMEPRAZOLE 20 MG PO CPDR
20.0000 mg | DELAYED_RELEASE_CAPSULE | Freq: Every day | ORAL | 0 refills | Status: DC
  Filled 2022-06-15: qty 90, 90d supply, fill #0

## 2022-06-15 MED ORDER — CITALOPRAM HYDROBROMIDE 40 MG PO TABS
40.0000 mg | ORAL_TABLET | Freq: Every day | ORAL | 0 refills | Status: DC
  Filled 2022-06-15: qty 90, 90d supply, fill #0

## 2022-06-15 MED ORDER — TRAZODONE HCL 50 MG PO TABS
50.0000 mg | ORAL_TABLET | Freq: Every evening | ORAL | 0 refills | Status: DC | PRN
Start: 2022-06-15 — End: 2022-10-05
  Filled 2022-06-15: qty 90, 90d supply, fill #0

## 2022-06-15 MED FILL — Cyclobenzaprine HCl Tab 10 MG: ORAL | 90 days supply | Qty: 90 | Fill #0 | Status: AC

## 2022-06-15 NOTE — Progress Notes (Signed)
Patient: Marc Aguilar  Service Category: E/M  Provider: Gillis Santa, MD  DOB: 03/02/1961  DOS: 06/15/2022  Referring Provider: Doyle Askew, MD  MRN: [id number]  Setting: Ambulatory outpatient  PCP: [name], NP  Type: New Patient  Specialty: Interventional Pain Management    Location: Office  Delivery: Face-to-face     Primary Reason(s) for Visit: Encounter for initial evaluation of one or more chronic problems (new to examiner) potentially causing chronic pain, and posing a threat to normal musculoskeletal function. (Level of risk: High) CC: Arm Pain (right)  HPI  Marc Aguilar is a 61 y.o. year old, male patient, who comes for the first time to our practice referred by Girtha Hake I, MD for our initial evaluation of his chronic pain. He has Diabetes mellitus ([place]); OSA (obstructive sleep apnea); GERD (gastroesophageal reflux disease); HLD (hyperlipidemia); Osteoarthritis; Migraines; Anxiety and depression; HTN (hypertension); History of prostate cancer; Insomnia; Class 1 obesity due to excess calories with serious comorbidity and body mass index (BMI) of 32.0 to 32.9 in adult; Cervical radicular pain; Herniation of cervical intervertebral disc with radiculopathy; and Chronic pain syndrome on their problem list. Today he comes in for evaluation of his Arm Pain (right)  Pain Assessment: Location: Right, Left, Mid, Lower, Upper Arm (arm and hand on right side, bothb shoulder blade, neck, numbnes in right leg) Radiating: pain radiaities eveywhere Onset: More than a month ago Duration: Chronic pain Quality: Constant, Burning, Aching, Throbbing, Numbness, Nagging, Tightness, Tingling, Stabbing Severity: 6 /10 (subjective, self-reported pain score)  Effect on ADL: limits my daily activities Timing: Constant Modifying factors: ice BP: (!) 138/99  HR: 98   Onset and Duration: Sudden 01/29/21 Cause of pain: Unknown Severity: Getting worse Timing: Night, During activity or  exercise, and After activity or exercise Aggravating Factors: Lifiting Alleviating Factors: Nerve blocks Associated Problems: Depression, Erectile dysfunction, Fatigue, Inability to concentrate, Inability to control bladder (urine), Nausea, Numbness, Sadness, Spasms, Tingling, Weakness, Pain that wakes patient up, and Pain that does not allow patient to sleep Quality of Pain: Aching, Cramping, and Sharp Previous Examinations or Tests: MRI scan, Nerve block, X-rays, and Orthopedic evaluation Previous Treatments: Narcotic medications  Marc Aguilar is a pleasant 61 year old male who presents with a chief complaint of neck pain which radiates down his right arm to his fingers as well as headaches that are most pronounced in his right temporal region.  Of note patient is status post right rotator cuff surgery on 05/21/2022 which she is still recovering from.  He has been participating in physical therapy for his right shoulder as well as his neck and radiating right arm pain.  He states that his shoulder is improving but he continues to have pain that radiates down into his forearm and fingers.  I explained to him that could be coming from his cervical spine from the disc herniation at C5 and C6.  We discussed a cervical epidural steroid injection to help with his symptoms.  Risks and benefits were reviewed and patient would like to proceed.  Meds   Current Outpatient Medications:    acetaminophen (TYLENOL) 500 MG tablet, Take 2 tablets (1,000 mg total) by mouth every 8 (eight) hours., Disp: 90 tablet, Rfl: 2   albuterol (VENTOLIN HFA) 108 (90 Base) MCG/ACT inhaler, Inhale 2 puffs into the lungs every 6 (six) hours as needed for wheezing or shortness of breath., Disp: 1 Inhaler, Rfl: 0   Alcohol Swabs (ALCOHOL PREP) PADS, 1 each by Does not apply route  2 (two) times daily., Disp: 200 each, Rfl: 3   amLODipine (NORVASC) 10 MG tablet, Take 1 tablet (10 mg total) by mouth daily., Disp: 90 tablet, Rfl: 1    atorvastatin (LIPITOR) 20 MG tablet, Take 1 tablet (20 mg total) by mouth daily. (Patient taking differently: Take 20 mg by mouth at bedtime.), Disp: 90 tablet, Rfl: 0   buPROPion (WELLBUTRIN XL) 300 MG 24 hr tablet, Take 1 tablet (300 mg total) by mouth daily., Disp: 90 tablet, Rfl: 0   citalopram (CELEXA) 40 MG tablet, TAKE 1 TABLET (40 MG TOTAL) BY MOUTH DAILY., Disp: 90 tablet, Rfl: 0   cyclobenzaprine (FLEXERIL) 10 MG tablet, Take 1 tablet (10 mg total) by mouth daily as needed for muscle spasms., Disp: 90 tablet, Rfl: 0   ezetimibe (ZETIA) 10 MG tablet, Take 1 tablet (10 mg total) by mouth daily., Disp: 90 tablet, Rfl: 0   Lancets MISC, 1 each by Does not apply route in the morning and at bedtime., Disp: 200 each, Rfl: 2   Multiple Vitamins-Minerals (MULTIVITAMIN WITH MINERALS) tablet, Take 1 tablet by mouth daily., Disp: , Rfl:    omeprazole (PRILOSEC) 20 MG capsule, Take 1 capsule (20 mg total) by mouth daily., Disp: 90 capsule, Rfl: 0   oxyCODONE (OXY IR/ROXICODONE) 5 MG immediate release tablet, Take 1-2 tablets (5-10 mg total) by mouth every 4 (four) hours as needed for pain., Disp: 30 tablet, Rfl: 0   oxyCODONE (ROXICODONE) 5 MG immediate release tablet, Take 1-2 tablets (5-10 mg total) by mouth every 4 (four) hours as needed (pain)., Disp: 30 tablet, Rfl: 0   tadalafil (CIALIS) 20 MG tablet, Take 1 tablet (20 mg total) by mouth daily as needed for erectile dysfunction., Disp: 10 tablet, Rfl: 6   traZODone (DESYREL) 50 MG tablet, Take 1 tablet (50 mg total) by mouth at bedtime as needed for sleep., Disp: 90 tablet, Rfl: 0  Imaging Review  Cervical Imaging: Cervical MR wo contrast: Results for orders placed during the hospital encounter of 03/10/22  MR Cervical Spine Wo Contrast  Narrative CLINICAL DATA:  Provided history: Cervical radiculitis. Cervical radiculopathy, no red flags. Additional history provided by scanning technologist: Patient reports neck pain with bilateral shoulder  pain and arm numbness (worse on right side), symptoms for 2 years.  EXAM: MRI CERVICAL SPINE WITHOUT CONTRAST  TECHNIQUE: Multiplanar, multisequence MR imaging of the cervical spine was performed. No intravenous contrast was administered.  COMPARISON:  Cervical spine radiographs 08/13/2021.  FINDINGS: Alignment: Straightening of the expected cervical lordosis. Trace C2-C3 grade 1 anterolisthesis. Trace C4-C5 grade 1 retrolisthesis.  Vertebrae: Vertebral body height is maintained. No significant marrow edema or focal suspicious osseous lesion.  Cord: No signal abnormality identified within the cervical spinal cord.  Posterior Fossa, vertebral arteries, paraspinal tissues: No abnormality identified within included portions of the posterior fossa. Flow voids preserved within the imaged cervical vertebral arteries. No paraspinal mass or collection.  Disc levels:  Multilevel disc degeneration, greatest at C4-C5 (mild-to-moderate) and C5-C6 (moderate).  C2-C3: Trace grade 1 anterolisthesis. No significant disc herniation or stenosis.  C3-C4: Small central disc protrusion. Uncovertebral hypertrophy (predominantly on the right). The disc protrusion results in mild focal effacement of the ventral thecal sac and may contact the ventral spinal cord. Mild right neural foraminal narrowing.  C4-C5: Trace grade 1 retrolisthesis. Disc bulge. Uncovertebral hypertrophy (predominantly on the right). The disc protrusion effaces the ventral thecal sac, mildly narrowing the spinal canal and contacting the ventral spinal cord. Moderate right neural  foraminal narrowing.  C5-C6: Disc bulge with left greater than right uncovertebral hypertrophy. Mild relative spinal canal narrowing (without significant spinal cord mass effect). Bilateral neural foraminal narrowing (moderate right, moderate/severe left).  C6-C7: No significant disc herniation or stenosis.  C7-T1: Mild facet arthrosis on the  left. No significant disc herniation or stenosis.  IMPRESSION: Cervical spondylosis, as outlined.  No more than mild spinal canal stenosis.  Multilevel foraminal stenosis, as detailed and greatest on the right at C4-C5 (moderate) and bilaterally at C5-C6 (moderate right, moderate/severe left).  Disc degeneration is greatest at C4-C5 (mild-to-moderate) and C5-C6 (moderate).  Nonspecific straightening of the expected cervical lordosis.   Electronically Signed By: Kellie Simmering D.O. On: 03/10/2022 18:57 DG Cervical Spine Complete  Narrative CLINICAL DATA:  Neck pain, bilateral arm numbness.  EXAM: CERVICAL SPINE - COMPLETE 4+ VIEW  COMPARISON:  None.  FINDINGS: There is no evidence of cervical spine fracture or prevertebral soft tissue swelling. Alignment is normal. No other significant bone abnormalities are identified.  IMPRESSION: Negative cervical spine radiographs.   Electronically Signed By: Fidela Salisbury M.D. On: 08/15/2021 20:54  MR Shoulder Right Wo Contrast  Narrative CLINICAL DATA:  Right arm weakness.  Right shoulder pain.  EXAM: MRI OF THE RIGHT SHOULDER WITHOUT CONTRAST  TECHNIQUE: Multiplanar, multisequence MR imaging of the shoulder was performed. No intravenous contrast was administered.  COMPARISON:  Right shoulder radiographs 11/21/2018  FINDINGS: Rotator cuff: There is a fluid bright tear of the posterior supraspinatus tendon footprint measuring up to 14 mm in AP dimension (sagittal image 4). A small portion of this is a partial-thickness articular sided tear anteriorly however the majority of the tear is full-thickness (coronal series 9 images 8 through 11), with up to 10 mm tendon retraction. There is moderate intermediate T2 signal anterior supraspinatus and diffuse infraspinatus tendinosis. Tiny partial-thickness articular midsubstance tear of the superior subscapularis tendon insertion (axial image 9). Mild  superior subscapularis intermediate T2 signal tendinosis. The teres minor is intact.  Muscles:  Mild anterior supraspinatus muscle atrophy.  Biceps long head: Moderate proximal long head of the biceps intermediate T2 signal tendinosis.  Acromioclavicular Joint: There are mild-to-moderate degenerative changes of the acromioclavicular joint including joint space narrowing, subchondral marrow edema, and peripheral osteophytosis. Type I to type II acromion.  Glenohumeral Joint: Mild thinning of the glenoid and humeral head cartilage.  Labrum: Mild peripheral degenerative irregularity of the posterosuperior glenoid labrum.  Bones:  No acute fracture.  Other: None.  IMPRESSION: 1. Moderate full-thickness tear of the posterior supraspinatus tendon footprint measuring up to 14 mm in AP dimension. Mild supraspinatus muscle atrophy. 2. Moderate supraspinatus and infraspinatus tendinosis. 3. Small superior subscapularis partial-thickness tear. 4. Mild-to-moderate degenerative changes of the acromioclavicular joint. 5. Moderate proximal long head of the biceps tendinosis.   Electronically Signed By: Yvonne Kendall M.D. On: 03/11/2022 09:57  Narrative CLINICAL DATA:  Right shoulder pain for 2 months following heavy lifting, initial encounter  EXAM: RIGHT SHOULDER - 2+ VIEW  COMPARISON:  None.  FINDINGS: Mild degenerative changes of the acromioclavicular joint are seen. No acute fracture or dislocation is noted. No soft tissue abnormality is noted.  IMPRESSION: Mild degenerative change without acute abnormality.   Electronically Signed By: Inez Catalina M.D. On: 11/21/2018 11:13  DG Shoulder Left  Narrative CLINICAL DATA:  Chronic right shoulder pain. Positive drop can test.  EXAM: LEFT SHOULDER - 2+ VIEW  COMPARISON:  None.  FINDINGS: There is no evidence of fracture or dislocation. There is  no evidence of arthropathy or other focal bone abnormality.  Soft tissues are unremarkable.  IMPRESSION: Negative.   Electronically Signed By: Logan Bores M.D. On: 11/14/2018 14:47   Narrative CLINICAL DATA:  Lumbago with intermittent right-sided radicular symptoms  EXAM: LUMBAR SPINE - COMPLETE 4+ VIEW  COMPARISON:  None.  FINDINGS: Frontal, lateral, spot lumbosacral lateral, and bilateral oblique views were obtained. There are 5 non-rib-bearing lumbar type vertebral bodies. There is no fracture or spondylolisthesis. Disc spaces appear normal. There is mild facet osteoarthritic change on the right at L5-S1. Other facets appear unremarkable.  IMPRESSION: Mild facet osteoarthritic change in the right L5-S1. No disc space narrowing. No fracture or spondylolisthesis.   Electronically Signed By: Lowella Grip III M.D. On: 10/02/2015 09:15   MR Knee Right Wo Contrast  Narrative CLINICAL DATA:  Right knee swelling.  EXAM: MRI OF THE RIGHT KNEE WITHOUT CONTRAST  TECHNIQUE: Multiplanar, multisequence MR imaging of the knee was performed. No intravenous contrast was administered.  COMPARISON:  Plain films the right knee 03/06/2014.  FINDINGS: MENISCI  Medial meniscus: There is a horizontal tear at the junction of the anterior horn and body of the medial meniscus reaching the femoral articular surface. The body of the medial meniscus is diminutive with intrasubstance increased T2 signal consistent with marked degeneration.  Lateral meniscus: A focal radial tear is seen centrally in the body of the lateral meniscus. Degenerative maceration of the anterior horn is present with the bulk of the anterior horn not visualized.  LIGAMENTS  Cruciates: Intact. Mucoid degeneration of the posterior cruciate ligament is identified.  Collaterals:  Intact.  CARTILAGE  Patellofemoral: Extensive hyaline cartilage irregularity and thinning in the mid and superior pole at the apex along the lateral facet is  identified.  Medial: Marked irregularity and thinning of hyaline cartilage are seen.  Lateral: Marked irregularity and thinning of hyaline cartilage are seen.  Joint: Prominent synovial thickening with fronds of fatty hypertrophy and a moderate joint effusion. Large medial plica is present.  Popliteal Fossa: Baker's cyst measures 4.6 cm craniocaudal by 1.3 cm transverse by 2.6 cm AP.  Extensor Mechanism:  Intact.  Bones: Subchondral edema and cyst formation is seen in the weight-bearing surface of the medial femoral condyle. Mild edema in the lateral tibial plateau is present.  IMPRESSION: Medial and lateral meniscal tearing as described above.  Findings consistent with lipoma arborescens.  Mucoid degeneration of the posterior cruciate ligament without tear.  Tricompartmental osteoarthritis.  Baker's cyst.   Electronically Signed By: Inge Rise M.D. On: 04/03/2014 16:06  DG Knee Complete 4 Views Right  Narrative CLINICAL DATA:  Right knee pain  EXAM: RIGHT KNEE - COMPLETE 4+ VIEW  COMPARISON:  None.  FINDINGS: Mild medial joint space narrowing is noted. No joint effusion or acute fracture is seen.  IMPRESSION: Mild degenerative change without acute abnormality.   Electronically Signed By: Inez Catalina M.D. On: 03/06/2014 15:52  Knee-L DG 4 views: Results for orders placed during the hospital encounter of 03/06/14  DG Knee Complete 4 Views Left  Narrative CLINICAL DATA:  Left knee pain following injury  EXAM: LEFT KNEE - COMPLETE 4+ VIEW  COMPARISON:  None.  FINDINGS: The medial joint space narrowing is noted. No acute fracture or dislocation is seen. No gross soft tissue abnormality is noted.  IMPRESSION: Mild degenerative change without acute abnormality.   Electronically Signed By: Inez Catalina M.D. On: 03/06/2014 15:51   Complexity Note: Imaging results reviewed.  ROS  Cardiovascular: Daily Aspirin  intake, High blood pressure, Heart murmur, and Heart catheterization Pulmonary or Respiratory: Snoring  and Temporary stoppage of breathing during sleep Neurological: No reported neurological signs or symptoms such as seizures, abnormal skin sensations, urinary and/or fecal incontinence, being born with an abnormal open spine and/or a tethered spinal cord Psychological-Psychiatric: No reported psychological or psychiatric signs or symptoms such as difficulty sleeping, anxiety, depression, delusions or hallucinations (schizophrenial), mood swings (bipolar disorders) or suicidal ideations or attempts Gastrointestinal: Heartburn due to stomach pushing into lungs (Hiatal hernia), Reflux or heatburn, and Inflamed pancreas (Pancreatitis) Genitourinary: No reported renal or genitourinary signs or symptoms such as difficulty voiding or producing urine, peeing blood, non-functioning kidney, kidney stones, difficulty emptying the bladder, difficulty controlling the flow of urine, or chronic kidney disease Hematological: No reported hematological signs or symptoms such as prolonged bleeding, low or poor functioning platelets, bruising or bleeding easily, hereditary bleeding problems, low energy levels due to low hemoglobin or being anemic Endocrine: High blood sugar requiring insulin (IDDM) Rheumatologic: Joint aches and or swelling due to excess weight (Osteoarthritis) Musculoskeletal: Negative for myasthenia gravis, muscular dystrophy, multiple sclerosis or malignant hyperthermia Work History: Working full time and Out of work due to pain  Allergies  Mr. Coryell is allergic to tape.  Laboratory Chemistry Profile   Renal Lab Results  Component Value Date   BUN 17 03/18/2022   CREATININE 0.92 03/18/2022   LABCREA 119 97/11/6376   BCR NOT APPLICABLE 58/85/0277   GFR 87.70 01/01/2021   GFRAA >60 11/20/2019   GFRNONAA >60 09/26/2020   SPECGRAV 1.015 11/09/2019   PHUR 6.0 11/09/2019   PROTEINUR Negative  11/09/2019     Electrolytes Lab Results  Component Value Date   NA 138 03/18/2022   K 4.1 03/18/2022   CL 105 03/18/2022   CALCIUM 9.4 03/18/2022     Hepatic Lab Results  Component Value Date   AST 22 03/18/2022   ALT 16 03/18/2022   ALBUMIN 4.4 01/01/2021   ALKPHOS 78 01/01/2021   AMYLASE 111 09/30/2020   LIPASE 53.0 09/30/2020     ID Lab Results  Component Value Date   HIV Non Reactive 02/28/2018   SARSCOV2NAA NEGATIVE 09/26/2020   HCVAB NEGATIVE 10/02/2015     Bone Lab Results  Component Value Date   VD25OH 34.21 11/14/2018     Endocrine Lab Results  Component Value Date   GLUCOSE 153 (H) 03/18/2022   GLUCOSEU Negative 11/09/2019   HGBA1C 5.8 (A) 03/18/2022   TSH 1.23 08/18/2021   CRTSLPL 9.4 08/18/2021     Neuropathy Lab Results  Component Value Date   VITAMINB12 798 11/14/2018   HGBA1C 5.8 (A) 03/18/2022   HIV Non Reactive 02/28/2018     CNS No results found for: "COLORCSF", "APPEARCSF", "RBCCOUNTCSF", "WBCCSF", "POLYSCSF", "LYMPHSCSF", "EOSCSF", "PROTEINCSF", "GLUCCSF", "JCVIRUS", "CSFOLI", "IGGCSF", "LABACHR", "ACETBL"   Inflammation (CRP: Acute  ESR: Chronic) No results found for: "CRP", "ESRSEDRATE", "LATICACIDVEN"   Rheumatology No results found for: "RF", "ANA", "LABURIC", "URICUR", "LYMEIGGIGMAB", "LYMEABIGMQN", "HLAB27"   Coagulation Lab Results  Component Value Date   INR 1.0 09/26/2020   LABPROT 12.4 09/26/2020   PLT 256 03/18/2022   DDIMER 0.49 09/26/2020     Cardiovascular Lab Results  Component Value Date   TROPONINI <0.03 03/01/2018   HGB 14.0 03/18/2022   HCT 43.6 03/18/2022     Screening Lab Results  Component Value Date   [place] NEGATIVE 09/26/2020   COVIDSOURCE NASOPHARYNGEAL 10/27/2019   HCVAB NEGATIVE 10/02/2015  HIV Non Reactive 02/28/2018     Cancer No results found for: "CEA", "CA125", "LABCA2"   Allergens No results found for: "ALMOND", "APPLE", "ASPARAGUS", "AVOCADO", "BANANA", "BARLEY",  "BASIL", "BAYLEAF", "GREENBEAN", "LIMABEAN", "WHITEBEAN", "BEEFIGE", "REDBEET", "BLUEBERRY", "BROCCOLI", "CABBAGE", "MELON", "CARROT", "CASEIN", "CASHEWNUT", "CAULIFLOWER", "CELERY"     Note: Lab results reviewed.  PFSH  Drug: Mr. Pienta  reports no history of drug use. Alcohol:  reports current alcohol use of about 12.0 standard drinks of alcohol per week. Tobacco:  reports that he quit smoking about 12 years ago. His smoking use included cigarettes. He has a 27.00 pack-year smoking history. He quit smokeless tobacco use about 27 years ago.  His smokeless tobacco use included chew. Medical:  has a past medical history of Anxiety, Arthritis, Cancer ([place]), Chicken pox, Depression, GERD (gastroesophageal reflux disease), Heart murmur, Hernia, abdominal, High cholesterol, History of gout, Hypertension, Migraine, Neck pain, OSA on CPAP, Pneumonia, Refusal of blood transfusions as patient is Jehovah's Witness, Seasonal allergies, and Type II diabetes mellitus ([place]). Family: family history includes Arthritis in his maternal grandfather, maternal grandmother, and paternal grandmother; Bladder Cancer in his father; Diabetes in his brother, maternal aunt, maternal grandmother, mother, and sister; Heart disease in his brother; Hyperlipidemia in his mother; Liver cancer in his sister.  Past Surgical History:  Procedure Laterality Date   CARDIAC CATHETERIZATION  02/28/2018   LEFT HEART CATH AND CORONARY ANGIOGRAPHY N/A 02/28/2018   Procedure: LEFT HEART CATH AND CORONARY ANGIOGRAPHY;  Surgeon: Sherren Mocha, MD;  Location: [facility];  Service: Cardiovascular;  Laterality: N/A;   ROBOT ASSISTED LAPAROSCOPIC RADICAL PROSTATECTOMY N/A 11/19/2019   Procedure: XI ROBOTIC ASSISTED LAPAROSCOPIC RADICAL PROSTATECTOMY WITH LYMPH NODE DISSECTION;  Surgeon: Hollice Espy, MD;  Location: [facility];  Service: Urology;  Laterality: N/A;   SHOULDER ARTHROSCOPY WITH ROTATOR CUFF REPAIR AND SUBACROMIAL DECOMPRESSION  Right 05/21/2022   Procedure: Right shoulder arthroscopic rotator cuff repair (supraspinatus & subscapularis), subacromial decompression, and biceps tenodesis;  Surgeon: Leim Fabry, MD;  Location: [facility];  Service: Orthopedics;  Laterality: Right;   WRIST GANGLION EXCISION Left 1984   Active Ambulatory Problems    Diagnosis Date Noted   Diabetes mellitus ([place]) 10/30/2013   OSA (obstructive sleep apnea) 03/13/2015   GERD (gastroesophageal reflux disease) 03/13/2015   HLD (hyperlipidemia) 10/02/2015   Osteoarthritis 11/14/2018   Migraines 11/14/2018   Anxiety and depression 11/14/2018   HTN (hypertension) 11/14/2018   History of prostate cancer 11/19/2019   Insomnia 08/17/2021   Class 1 obesity due to excess calories with serious comorbidity and body mass index (BMI) of 32.0 to 32.9 in adult 08/17/2021   Cervical radicular pain 03/18/2022   Herniation of cervical intervertebral disc with radiculopathy 06/15/2022   Chronic pain syndrome 06/15/2022   Resolved Ambulatory Problems    Diagnosis Date Noted   Varicose veins of lower extremities with other complications 26/83/4196   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   Seasonal allergies 03/13/2015   Abscess of axillary region 04/27/2016   Benign prostatic hyperplasia with urinary frequency 10/26/2016   Chest pain with moderate risk for cardiac etiology 02/28/2018   Past Medical History:  Diagnosis Date   Anxiety    Arthritis    Cancer ([place])    Chicken pox    Depression    Heart murmur    Hernia, abdominal    High cholesterol    History of gout    Hypertension    Migraine    Neck pain    OSA on CPAP  Pneumonia    Refusal of blood transfusions as patient is Jehovah's Witness    Type II diabetes mellitus ([place])    Constitutional Exam  General appearance: Well nourished, well developed, and well hydrated. In no apparent acute distress Vitals:   06/15/22 1320  BP: (!) 138/99  Pulse: 98  Temp: (!) 97 F (36.1 C)   SpO2: 97%  Weight: 232 lb (105.2 kg)  Height: _0  (1.803 m)   BMI Assessment: Estimated body mass index is 32.36 kg/m as calculated from the following:   Height as of this encounter: _1  (1.803 m).   Weight as of this encounter: 232 lb (105.2 kg).  BMI interpretation table: BMI level Category Range association with higher incidence of chronic pain  <18 kg/m2 Underweight   18.5-24.9 kg/m2 Ideal body weight   25-29.9 kg/m2 Overweight Increased incidence by 20%  30-34.9 kg/m2 Obese (Class I) Increased incidence by 68%  35-39.9 kg/m2 Severe obesity (Class II) Increased incidence by 136%  >40 kg/m2 Extreme obesity (Class III) Increased incidence by 254%   Patient's current BMI Ideal Body weight  Body mass index is 32.36 kg/m. Ideal body weight: 75.3 kg (166 lb 0.1 oz) Adjusted ideal body weight: 87.3 kg (192 lb 6.5 oz)   BMI Readings from Last 4 Encounters:  06/15/22 32.36 kg/m  05/21/22 32.99 kg/m  05/20/22 33.00 kg/m  03/18/22 32.78 kg/m   Wt Readings from Last 4 Encounters:  06/15/22 232 lb (105.2 kg)  05/21/22 229 lb 15 oz (104.3 kg)  05/20/22 230 lb (104.3 kg)  03/18/22 235 lb (106.6 kg)    Psych/Mental status: Alert, oriented x 3 (person, place, & time)       Eyes: PERLA Respiratory: No evidence of acute respiratory distress  Cervical Spine Area Exam  Skin & Axial Inspection:  Right arm in shoulder sling Alignment: Symmetrical Functional ROM: Pain restricted ROM      Stability: No instability detected Muscle Tone/Strength: Functionally intact. No obvious neuro-muscular anomalies detected. Sensory (Neurological): Neurogenic pain pattern Palpation: No palpable anomalies             Upper Extremity (UE) Exam    Side: Right upper extremity  Side: Left upper extremity  Skin & Extremity Inspection: Skin color, temperature, and hair growth are WNL. No peripheral edema or cyanosis. No masses, redness, swelling, asymmetry, or associated skin lesions. No  contractures.  Skin & Extremity Inspection: Skin color, temperature, and hair growth are WNL. No peripheral edema or cyanosis. No masses, redness, swelling, asymmetry, or associated skin lesions. No contractures.  Functional ROM: Pain restricted ROM for shoulder and elbow  Functional ROM: Unrestricted ROM          Muscle Tone/Strength: Functionally intact. No obvious neuro-muscular anomalies detected.  Muscle Tone/Strength: Functionally intact. No obvious neuro-muscular anomalies detected.  Sensory (Neurological): Dermatomal pain pattern          Sensory (Neurological): Unimpaired          Palpation: No palpable anomalies              Palpation: No palpable anomalies              Provocative Test(s):  Phalen's test: deferred Tinel's test: deferred Apley's scratch test (touch opposite shoulder):  Action 1 (Across chest): deferred Action 2 (Overhead): deferred Action 3 (LB reach): deferred   Provocative Test(s):  Phalen's test: deferred Tinel's test: deferred Apley's scratch test (touch opposite shoulder):  Action 1 (Across chest): deferred Action 2 (Overhead): deferred Action  3 (LB reach): deferred    Thoracic Spine Area Exam  Skin & Axial Inspection: No masses, redness, or swelling Alignment: Symmetrical Functional ROM: Unrestricted ROM Stability: No instability detected Muscle Tone/Strength: Functionally intact. No obvious neuro-muscular anomalies detected. Sensory (Neurological): Unimpaired Muscle strength & Tone: No palpable anomalies  Assessment  Primary Diagnosis & Pertinent Problem List: The primary encounter diagnosis was Cervical radiculitis. Diagnoses of Cervical radicular pain (right), Herniation of cervical intervertebral disc with radiculopathy, and Chronic pain syndrome were also pertinent to this visit.  Visit Diagnosis (New problems to examiner): 1. Cervical radiculitis   2. Cervical radicular pain (right)   3. Herniation of cervical intervertebral disc with  radiculopathy   4. Chronic pain syndrome    Plan of Care (Initial workup plan)   Cervical radicular pain with MRI evidence of right greater than left C4-C5 and C5-C6 moderate to severe neuroforaminal stenosis along with cervical disc degeneration at C4-C5 and C5-C6.  Continue with physical therapy and multimodal analgesics.  I informed the patient that I will be focusing primarily on interventional pain management and that I am not taking patients on for chronic medication management at this time.  I discussed a right C7-T1 cervical epidural steroid injection.  Risk and benefits were reviewed and patient would like to proceed.  We will plan on doing this with p.o. Valium.   Procedure Orders         Cervical Epidural Injection         Provider-requested follow-up: Return in about 2 weeks (around 06/29/2022) for R C-ESI , in clinic (PO Valium).  I spent a total of 60 minutes reviewing chart data, face-to-face evaluation with the patient, counseling and coordination of care as detailed above.   Future Appointments  Date Time Provider [place]  07/22/2022 11:00 AM [facility] None  07/27/2022 10:15 AM Hollice Espy, MD [facility] None    Note by: Gillis Santa, MD Date: 06/15/2022; Time: 2:41 PM

## 2022-06-15 NOTE — Telephone Encounter (Signed)
Requested Prescriptions  Pending Prescriptions Disp Refills  . buPROPion (WELLBUTRIN XL) 300 MG 24 hr tablet 90 tablet 0    Sig: Take 1 tablet (300 mg total) by mouth daily.     Psychiatry: Antidepressants - bupropion Failed - 06/14/2022 10:31 AM      Failed - Last BP in normal range    BP Readings from Last 1 Encounters:  05/21/22 (P) 136/85         Passed - Cr in normal range and within 360 days    Creat  Date Value Ref Range Status  03/18/2022 0.92 0.70 - 1.30 mg/dL Final   Creatinine,U  Date Value Ref Range Status  11/14/2018 183.0 mg/dL Final   Creatinine, Urine  Date Value Ref Range Status  08/18/2021 119 20 - 320 mg/dL Final         Passed - AST in normal range and within 360 days    AST  Date Value Ref Range Status  03/18/2022 22 10 - 35 U/L Final         Passed - ALT in normal range and within 360 days    ALT  Date Value Ref Range Status  03/18/2022 16 9 - 46 U/L Final         Passed - Completed PHQ-2 or PHQ-9 in the last 360 days      Passed - Valid encounter within last 6 months    Recent Outpatient Visits          2 months ago Type 2 diabetes mellitus with hyperglycemia, without long-term current use of insulin [facility])   [facility] [place], Coralie Keens, NP   3 months ago Herpes zoster without complication   [facility] [place], Coralie Keens, NP   10 months ago Encounter for general adult medical examination with abnormal findings   [facility], Coralie Keens, NP      Future Appointments            In 1 month Hollice Espy, MD [place]           . omeprazole (PRILOSEC) 20 MG capsule 90 capsule 0    Sig: Take 1 capsule (20 mg total) by mouth daily.     Gastroenterology: Proton Pump Inhibitors Passed - 06/14/2022 10:31 AM      Passed - Valid encounter within last 12 months    Recent Outpatient Visits          2 months ago Type 2 diabetes mellitus with hyperglycemia, without  long-term current use of insulin [facility])   [facility] [place], Coralie Keens, NP   3 months ago Herpes zoster without complication   [place], NP   10 months ago Encounter for general adult medical examination with abnormal findings   [facility], Coralie Keens, NP      Future Appointments            In 1 month Hollice Espy, MD [place]           . ezetimibe (ZETIA) 10 MG tablet 90 tablet 0    Sig: Take 1 tablet (10 mg total) by mouth daily.     Cardiovascular:  Antilipid - Sterol Transport Inhibitors Failed - 06/14/2022 10:31 AM      Failed - Lipid Panel in normal range within the last 12 months    Cholesterol, Total  Date Value Ref Range Status  10/26/2019 157 100 - 199 mg/dL Final   Cholesterol  Date Value Ref Range Status  03/18/2022 195 <200 mg/dL Final   LDL Cholesterol (Calc)  Date Value Ref Range Status  03/18/2022  mg/dL (calc) Final    Comment:    . LDL cholesterol not calculated. Triglyceride levels greater than 400 mg/dL invalidate calculated LDL results. . Reference range: <100 . Desirable range <100 mg/dL for primary prevention;   <70 mg/dL for patients with CHD or diabetic patients  with > or = 2 CHD risk factors. Marland Kitchen LDL-C is now calculated using the Martin-Hopkins  calculation, which is a validated novel method providing  better accuracy than the Friedewald equation in the  estimation of LDL-C.  Cresenciano Genre et al. Annamaria Helling. 5027;741(28): 2061-2068  (http://education.[employer].com/faq/FAQ164)    Direct LDL  Date Value Ref Range Status  01/01/2021 127.0 mg/dL Final    Comment:    Optimal:  <100 mg/dLNear or Above Optimal:  100-129 mg/dLBorderline High:  130-159 mg/dLHigh:  160-189 mg/dLVery High:  >190 mg/dL   HDL  Date Value Ref Range Status  03/18/2022 50 > OR = 40 mg/dL Final  10/26/2019 48 >39 mg/dL Final   Triglycerides  Date Value Ref Range Status   03/18/2022 543 (H) <150 mg/dL Final    Comment:    . If a non-fasting specimen was collected, consider repeat triglyceride testing on a fasting specimen if clinically indicated.  Yates Decamp et al. J. of Clin. Lipidol. 7867;6:720-947. . . There is increased risk of pancreatitis when the  triglyceride concentration is very high  (> or = 500 mg/dL, especially if > or = 1000 mg/dL).  Yates Decamp et al. J. of Clin. Lipidol. 0962;8:366-294. Marland Kitchen          Passed - AST in normal range and within 360 days    AST  Date Value Ref Range Status  03/18/2022 22 10 - 35 U/L Final         Passed - ALT in normal range and within 360 days    ALT  Date Value Ref Range Status  03/18/2022 16 9 - 46 U/L Final         Passed - Patient is not pregnant      Passed - Valid encounter within last 12 months    Recent Outpatient Visits          2 months ago Type 2 diabetes mellitus with hyperglycemia, without long-term current use of insulin [facility])   [facility] [place], Coralie Keens, NP   3 months ago Herpes zoster without complication   [facility] [place], Coralie Keens, NP   10 months ago Encounter for general adult medical examination with abnormal findings   [facility], Coralie Keens, NP      Future Appointments            In 1 month Hollice Espy, MD [place]           . citalopram (CELEXA) 40 MG tablet 90 tablet 0    Sig: TAKE 1 TABLET (40 MG TOTAL) BY MOUTH DAILY.     Psychiatry:  Antidepressants - SSRI Passed - 06/14/2022 10:31 AM      Passed - Completed PHQ-2 or PHQ-9 in the last 360 days      Passed - Valid encounter within last 6 months    Recent Outpatient Visits          2 months ago Type 2 diabetes mellitus with  hyperglycemia, without long-term current use of insulin [facility])   [facility] [place], [place], [place]   3 months ago Herpes zoster without complication   [facility] [place], Coralie Keens, NP   10 months ago Encounter for general adult medical examination with abnormal findings   [facility], Coralie Keens, NP      Future Appointments            In 1 month Hollice Espy, MD [place]           . traZODone (DESYREL) 50 MG tablet 90 tablet 0    Sig: Take 1 tablet (50 mg total) by mouth at bedtime as needed for sleep.     Psychiatry: Antidepressants - Serotonin Modulator Passed - 06/14/2022 10:31 AM      Passed - Completed PHQ-2 or PHQ-9 in the last 360 days      Passed - Valid encounter within last 6 months    Recent Outpatient Visits          2 months ago Type 2 diabetes mellitus with hyperglycemia, without long-term current use of insulin [facility])   [facility] [place], Coralie Keens, NP   3 months ago Herpes zoster without complication   [facility] [place], Coralie Keens, NP   10 months ago Encounter for general adult medical examination with abnormal findings   [facility], Coralie Keens, NP      Future Appointments            In 1 month Hollice Espy, MD [place]

## 2022-06-15 NOTE — Progress Notes (Signed)
Safety precautions to be maintained throughout the outpatient stay will include: orient to surroundings, keep bed in low position, maintain call bell within reach at all times, provide assistance with transfer out of bed and ambulation.  

## 2022-06-15 NOTE — Telephone Encounter (Signed)
Requested medication (s) are due for refill today - yes  Requested medication (s) are on the active medication list -yes  Future visit scheduled -no  Last refill: 05/04/22 #30  Notes to clinic: non delegated Rx  Requested Prescriptions  Pending Prescriptions Disp Refills   cyclobenzaprine (FLEXERIL) 10 MG tablet 30 tablet 0    Sig: Take 1 tablet (10 mg total) by mouth 3 (three) times daily as needed for muscle spasms.     Not Delegated - Analgesics:  Muscle Relaxants Failed - 06/14/2022  3:02 PM      Failed - This refill cannot be delegated      Passed - Valid encounter within last 6 months    Recent Outpatient Visits           2 months ago Type 2 diabetes mellitus with hyperglycemia, without long-term current use of insulin [facility])   [facility] [place], Coralie Keens, NP   3 months ago Herpes zoster without complication   [facility] [place], Coralie Keens, NP   10 months ago Encounter for general adult medical examination with abnormal findings   [place], Coralie Keens, NP       Future Appointments             In 1 month Hollice Espy, MD [facility]               Requested Prescriptions  Pending Prescriptions Disp Refills   cyclobenzaprine (FLEXERIL) 10 MG tablet 30 tablet 0    Sig: Take 1 tablet (10 mg total) by mouth 3 (three) times daily as needed for muscle spasms.     Not Delegated - Analgesics:  Muscle Relaxants Failed - 06/14/2022  3:02 PM      Failed - This refill cannot be delegated      Passed - Valid encounter within last 6 months    Recent Outpatient Visits           2 months ago Type 2 diabetes mellitus with hyperglycemia, without long-term current use of insulin [facility])   [facility] [place], Coralie Keens, NP   3 months ago Herpes zoster without complication   [facility] [place], Coralie Keens, NP   10 months ago Encounter for general adult medical examination  with abnormal findings   [facility], Coralie Keens, NP       Future Appointments             In 1 month Hollice Espy, MD [place]

## 2022-06-15 NOTE — Patient Instructions (Addendum)
Bring a Pharmacist, hospital for your procedure (without sedation) Instructions: Oral Intake: Do not eat or drink anything for at least 3 hours prior to your procedure. Transportation: Unless otherwise stated by your physician, you may drive yourself after the procedure. Blood Pressure Medicine: Take your blood pressure medicine with a sip of water the morning of the procedure. Insulin: Take only  of your normal insulin dose. Preventing infections: Shower with an antibacterial soap the morning of your procedure. Build-up your immune system: Take 1000 mg of Vitamin C with every meal (3 times a day) the day prior to your procedure. Pregnancy: If you are pregnant, call and cancel the procedure. Sickness: If you have a cold, fever, or any active infections, call and cancel the procedure. Arrival: You must be in the facility at least 30 minutes prior to your scheduled procedure. Children: Do not bring any children with you. Dress appropriately: Bring dark clothing that you would not mind if they get stained. Valuables: Do not bring any jewelry or valuables. Procedure appointments are reserved for interventional treatments only. No Prescription Refills. No medication changes will be discussed during procedure appointments. No disability issues will be discussed.

## 2022-06-22 ENCOUNTER — Other Ambulatory Visit (HOSPITAL_COMMUNITY): Payer: Self-pay

## 2022-06-22 ENCOUNTER — Other Ambulatory Visit: Payer: Self-pay | Admitting: Family

## 2022-06-23 ENCOUNTER — Other Ambulatory Visit (HOSPITAL_COMMUNITY): Payer: Self-pay

## 2022-06-30 ENCOUNTER — Ambulatory Visit
Payer: No Typology Code available for payment source | Attending: Student in an Organized Health Care Education/Training Program | Admitting: Student in an Organized Health Care Education/Training Program

## 2022-06-30 ENCOUNTER — Ambulatory Visit
Admission: RE | Admit: 2022-06-30 | Discharge: 2022-06-30 | Disposition: A | Discharge status: Home / Self Care | Payer: No Typology Code available for payment source | Source: Ambulatory Visit | Attending: Student in an Organized Health Care Education/Training Program | Admitting: Student in an Organized Health Care Education/Training Program

## 2022-06-30 ENCOUNTER — Encounter: Payer: Self-pay | Admitting: Student in an Organized Health Care Education/Training Program

## 2022-06-30 DIAGNOSIS — M5412 Radiculopathy, cervical region: Secondary | ICD-10-CM | POA: Insufficient documentation

## 2022-06-30 DIAGNOSIS — G894 Chronic pain syndrome: Secondary | ICD-10-CM | POA: Diagnosis present

## 2022-06-30 MED ORDER — DIAZEPAM 5 MG PO TABS
ORAL_TABLET | ORAL | Status: AC
  Filled 2022-06-30: qty 1

## 2022-06-30 MED ORDER — IOHEXOL 180 MG/ML  SOLN
INTRAMUSCULAR | Status: AC
  Filled 2022-06-30: qty 20

## 2022-06-30 MED ORDER — SODIUM CHLORIDE (PF) 0.9 % IJ SOLN
INTRAMUSCULAR | Status: AC
  Filled 2022-06-30: qty 10

## 2022-06-30 MED ORDER — LIDOCAINE HCL 2 % IJ SOLN
20.0000 mL | Freq: Once | INTRAMUSCULAR | Status: AC
  Administered 2022-06-30: 400 mg

## 2022-06-30 MED ORDER — IOHEXOL 180 MG/ML  SOLN
10.0000 mL | Freq: Once | INTRAMUSCULAR | Status: AC
  Administered 2022-06-30: 10 mL via EPIDURAL

## 2022-06-30 MED ORDER — SODIUM CHLORIDE 0.9% FLUSH
1.0000 mL | Freq: Once | INTRAVENOUS | Status: AC
  Administered 2022-06-30: 1 mL

## 2022-06-30 MED ORDER — DIAZEPAM 5 MG PO TABS
5.0000 mg | ORAL_TABLET | ORAL | Status: AC
  Administered 2022-06-30: 5 mg via ORAL

## 2022-06-30 MED ORDER — DEXAMETHASONE SODIUM PHOSPHATE 10 MG/ML IJ SOLN
10.0000 mg | Freq: Once | INTRAMUSCULAR | Status: AC
  Administered 2022-06-30: 10 mg

## 2022-06-30 MED ORDER — ROPIVACAINE HCL 2 MG/ML IJ SOLN
INTRAMUSCULAR | Status: AC
  Filled 2022-06-30: qty 20

## 2022-06-30 MED ORDER — ROPIVACAINE HCL 2 MG/ML IJ SOLN
1.0000 mL | Freq: Once | INTRAMUSCULAR | Status: AC
Start: 2022-06-30 — End: 2022-06-30
  Administered 2022-06-30: 1 mL via EPIDURAL

## 2022-06-30 MED ORDER — LIDOCAINE HCL 2 % IJ SOLN
INTRAMUSCULAR | Status: AC
  Filled 2022-06-30: qty 20

## 2022-06-30 MED ORDER — DEXAMETHASONE SODIUM PHOSPHATE 10 MG/ML IJ SOLN
INTRAMUSCULAR | Status: AC
  Filled 2022-06-30: qty 1

## 2022-06-30 NOTE — Patient Instructions (Signed)

## 2022-06-30 NOTE — Progress Notes (Signed)
PROVIDER NOTE: Interpretation of information contained herein should be left to medically-trained personnel. Specific patient instructions are provided elsewhere under "Patient Instructions" section of medical record. This document was created in part using STT-dictation technology, any transcriptional errors that may result from this process are unintentional.  Patient: Marc Aguilar Type: Established DOB: 29-Jul-1961 MRN: [id number] PCP: [name], NP  Service: Procedure DOS: 06/30/2022 Setting: Ambulatory Location: Ambulatory outpatient facility Delivery: Face-to-face Provider: Gillis Santa, MD Specialty: Interventional Pain Management Specialty designation: 09 Location: Outpatient facility Ref. Prov.: Gillis Santa, MD   Procedure [facility] Interventional Pain Management )   Type: Cervical Epidural Steroid injection (ESI) (Interlaminar) #1  Laterality: Midline  Level: C7-T1 Imaging: Fluoroscopy-assisted DOS: 06/30/2022  Performed by: Gillis Santa, MD Anesthesia: Local anesthesia (1-2% Lidocaine) Anxiolysis: Oral Valium 10 mg Sedation:    minimal            Purpose: Diagnostic/Therapeutic Indications: Cervicalgia, cervical radicular pain, degenerative disc disease, severe enough to impact quality of life or function.  1. Cervical radiculitis   2. Chronic pain syndrome    NAS-11 score:   Pre-procedure: 5 /10   Post-procedure: 3 /10      Pre-Procedure Preparation  Monitoring: As per clinic protocol. Respiration, ETCO2, SpO2, BP, heart rate and rhythm monitor placed and checked for adequate function  Risk Assessment: Vitals:  XAJ:OINOMVEHM body mass index is 32.36 kg/m as calculated from the following:   Height as of this encounter: '5\' 11"'$  (1.803 m).   Weight as of this encounter: 232 lb (105.2 kg)., Rate:78 , BP:139/89, Resp: , Temp:(!) 97.2 F (36.2 C), SpO2:97 %  Allergies: He is allergic to tape.  Precautions: None required  Blood-thinner(s): None at this time   Coagulopathies: Reviewed. None identified.   Active Infection(s): Reviewed. None identified. Marc Aguilar is afebrile   Location setting: Procedure suite Position: Prone, on modified reverse trendelenburg to facilitate breathing, with head in head-cradle. Pillows positioned under chest (below chin-level) with cervical spine flexed. Safety Precautions: Patient was assessed for positional comfort and pressure points before starting the procedure. Prepping solution: DuraPrep (Iodine Povacrylex [0.7% available iodine] and Isopropyl Alcohol, 74% w/w) Prep Area: Entire  cervicothoracic region Approach: percutaneous, paramedial Intended target: Posterior cervical epidural space Materials: Tray: Epidural Needle(s): Epidural (Tuohy) Qty: 1 Length: (60m) 3.5-inch Gauge: 22G   Meds ordered this encounter  Medications   iohexol (OMNIPAQUE) 180 MG/ML injection 10 mL    Must be Myelogram-compatible. If not available, you may substitute with a water-soluble, non-ionic, hypoallergenic, myelogram-compatible radiological contrast medium.   lidocaine (XYLOCAINE) 2 % (with pres) injection 400 mg   diazepam (VALIUM) tablet 5 mg    Make sure Flumazenil is available in the pyxis when using this medication. If oversedation occurs, administer 0.2 mg IV over 15 sec. If after 45 sec no response, administer 0.2 mg again over 1 min; may repeat at 1 min intervals; not to exceed 4 doses (1 mg)   sodium chloride flush (NS) 0.9 % injection 1 mL   ropivacaine (PF) 2 mg/mL (0.2%) (NAROPIN) injection 1 mL   dexamethasone (DECADRON) injection 10 mg   diazepam (VALIUM) tablet 5 mg    Make sure Flumazenil is available in the pyxis when using this medication. If oversedation occurs, administer 0.2 mg IV over 15 sec. If after 45 sec no response, administer 0.2 mg again over 1 min; may repeat at 1 min intervals; not to exceed 4 doses (1 mg)    Orders Placed This Encounter  Procedures  DG PAIN CLINIC C-ARM 1-60 MIN NO REPORT     Intraoperative interpretation by procedural physician at [place].    Standing Status:   Standing    Number of Occurrences:   1    Order Specific Question:   Reason for exam:    Answer:   Assistance in needle guidance and placement for procedures requiring needle placement in or near specific anatomical locations not easily accessible without such assistance.     Time-out: [id number] I initiated and conducted the "Time-out" before starting the procedure, as per protocol. The patient was asked to participate by confirming the accuracy of the "Time Out" information. Verification of the correct person, site, and procedure were performed and confirmed by me, the nursing staff, and the patient. "Time-out" conducted as per Joint Commission's Universal Protocol (UP.01.01.01). Procedure checklist: Completed   H&P (Pre-op  Assessment)  Marc Aguilar is a 61 y.o. (year old), male patient, seen today for interventional treatment. 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 Mr. Domeier has a current medication list which includes the following prescription(s): acetaminophen, albuterol, alcohol prep, amlodipine, atorvastatin, bupropion, citalopram, cyclobenzaprine, ezetimibe, lancets, multivitamin with minerals, omeprazole, oxycodone, oxycodone, tadalafil, and trazodone. His primarily concern today is the Neck Pain (right)  He is allergic to tape.   Last encounter: My last encounter with him was on 06/15/2022. Pertinent problems: Marc Aguilar does not have any pertinent problems on file. Pain Assessment: Severity of   is reported as a 5 /10. Location: Neck Right/pain radiaities down his rigght arm. Onset: More than a  month ago. Quality: Aching, Burning, Throbbing, Nagging. Timing: Constant. Modifying factor(s): Meds and procedures. Vitals:  height is '5\' 11"'$  (1.803 m) and weight is 232 lb (105.2 kg). His temperature is 97.2 F (36.2 C) (abnormal). His blood pressure is 120/86 and his pulse is 88. His oxygen saturation is 98%.   Reason for encounter: Interventional pain management therapy due pain of at least four (4) weeks in duration, with to failure to respond to and/or inability to tolerate more conservative care.   Site Confirmation: Mr. Bagot was asked to confirm the procedure and laterality before marking the site.  Consent: Before the procedure and under the influence of no sedative(s), amnesic(s), or anxiolytics, the patient was informed of the treatment options, risks and possible complications. To fulfill our ethical and legal obligations, as recommended by the American Medical Association's Code of Ethics, I have informed the patient of my clinical impression; the nature and purpose of the treatment or procedure; the risks, benefits, and possible complications of the intervention; the alternatives, including doing nothing; the risk(s) and benefit(s) of the alternative treatment(s) or procedure(s); and the risk(s) and benefit(s) of doing nothing. The patient was provided information about the general risks and possible complications associated with the procedure. These may include, but are not limited to: failure to achieve desired goals, infection, bleeding, organ or nerve damage, allergic reactions, paralysis, and death. In addition, the patient was informed of those risks and complications associated to Spine-related procedures, such as failure to decrease pain; infection (i.e.: Meningitis, epidural or intraspinal abscess); bleeding (i.e.: epidural hematoma, subarachnoid hemorrhage, or any other type of intraspinal or peri-dural bleeding); organ or nerve damage (i.e.: Any type of peripheral nerve, nerve  root, or spinal  cord injury) with subsequent damage to sensory, motor, and/or autonomic systems, resulting in permanent pain, numbness, and/or weakness of one or several areas of the body; allergic reactions; (i.e.: anaphylactic reaction); and/or death. Furthermore, the patient was informed of those risks and complications associated with the medications. These include, but are not limited to: allergic reactions (i.e.: anaphylactic or anaphylactoid reaction(s)); adrenal axis suppression; blood sugar elevation that in diabetics may result in ketoacidosis or comma; water retention that in patients with history of congestive heart failure may result in shortness of breath, pulmonary edema, and decompensation with resultant heart failure; weight gain; swelling or edema; medication-induced neural toxicity; particulate matter embolism and blood vessel occlusion with resultant organ, and/or nervous system infarction; and/or aseptic necrosis of one or more joints. Finally, the patient was informed that Medicine is not an exact science; therefore, there is also the possibility of unforeseen or unpredictable risks and/or possible complications that may result in a catastrophic outcome. The patient indicated having understood very clearly. We have given the patient no guarantees and we have made no promises. Enough time was given to the patient to ask questions, all of which were answered to the patient's satisfaction. Mr. Brunetto has indicated that he wanted to continue with the procedure. Attestation: I, the ordering provider, attest that I have discussed with the patient the benefits, risks, side-effects, alternatives, likelihood of achieving goals, and potential problems during recovery for the procedure that I have provided informed consent.  Date  Time: 06/30/2022  9:00 AM   Prophylactic antibiotics  Anti-infectives (From admission, onward)    None      Indication(s): None identified   Description of  procedure   Start Time: 0939 hrs  Local Anesthesia: Once the patient was positioned, prepped, and time-out was completed. The target area was identified located. The skin was marked with an approved surgical skin marker. Once marked, the skin (epidermis, dermis, and hypodermis), and deeper tissues (fat, connective tissue and muscle) were infiltrated with a small amount of a short-acting local anesthetic, loaded on a 10cc syringe with a 25G, 1.5-in  Needle. An appropriate amount of time was allowed for local anesthetics to take effect before proceeding to the next step. Local Anesthetic: Lidocaine 1-2% The unused portion of the local anesthetic was discarded in the proper designated containers. Safety Precautions: Aspiration looking for blood return was conducted prior to all injections. At no point did I inject any substances, as a needle was being advanced. Before injecting, the patient was told to immediately notify me if he was experiencing any new onset of "ringing in the ears, or metallic taste in the mouth". No attempts were made at seeking any paresthesias. Safe injection practices and needle disposal techniques used. Medications properly checked for expiration dates. SDV (single dose vial) medications used. After the completion of the procedure, all disposable equipment used was discarded in the proper designated medical waste containers.  Technical description: Protocol guidelines were followed. Using fluoroscopic guidance, the epidural needle was introduced through the skin, ipsilateral to the reported pain, and advanced to the target area. Posterior laminar os was contacted and the needle walked caudad, until the lamina was cleared. The ligamentum flavum was engaged and the epidural space identified using "loss-of-resistance technique" with 2-3 ml of PF-NaCl (0.9% NSS), in a 5cc dedicated LOR syringe. See "Imaging guidance" below for use of contrast details.  Injection: Once satisfactory needle  placement was confirmed, I proceeded to inject the desired solution in slow, incremental fashion, intermittently assessing for  discomfort or any signs of abnormal or undesired spread of substance. Once completed, the needle was removed and disposed of, as per hospital protocols.   3 cc solution made of 1 cc of preservative-free saline, 1 cc of 0.2% ropivacaine, 1 cc of Decadron 10 mg/cc.    Vitals:   06/30/22 0933 06/30/22 0938 06/30/22 0943 06/30/22 0946  BP: 137/88 134/81 119/84 120/86  Pulse: 93 90 90 88  Temp:      SpO2: 96% 97% 98% 98%  Weight:      Height:        End Time: 0942 hrs  Once the entire procedure was completed, the treated area was cleaned, making sure to leave some of the prepping solution back to take advantage of its long term bactericidal properties.   Imaging guidance  Type of Imaging Technique: Fluoroscopy Guidance (Spinal) Indication(s): Assistance in needle guidance and placement for procedures requiring needle placement in or near specific anatomical locations not easily accessible without such assistance. Exposure Time: Please see nurses notes for exact fluoroscopy time. Contrast: Before injecting any contrast, we confirmed that the patient did not have an allergy to iodine, shellfish, or radiological contrast. Once satisfactory needle placement was completed, radiological contrast was injected under continuous fluoroscopic guidance. Injection of contrast accomplished without complications. See chart for type and volume of contrast used. Fluoroscopic Guidance: I was personally present in the fluoroscopy suite, where the patient was placed in position for the procedure, over the fluoroscopy-compatible table. Fluoroscopy was manipulated, using "Tunnel Vision Technique", to obtain the best possible view of the target area, on the affected side. Parallax error was corrected before commencing the procedure. A "direction-depth-direction" technique was used to introduce  the needle under continuous pulsed fluoroscopic guidance. Once the target was reached, antero-posterior, oblique, and lateral fluoroscopic projection views were taken to confirm needle placement in all planes. Electronic images uploaded into EMR.  Interpretation: Successful epidural injection. Intraoperative imaging interpretation by performing Physician.    Post-op assessment  Post-procedure Vital Signs:  Pulse/HCG Rate: 88  Temp: (!) 97.2 F (36.2 C) Resp:   BP: 120/86 SpO2: 98 %  EBL: None  Complications: No immediate post-treatment complications observed by team, or reported by patient.  Note: The patient tolerated the entire procedure well. A repeat set of vitals were taken after the procedure and the patient was kept under observation following institutional policy, for this type of procedure. Post-procedural neurological assessment was performed, showing return to baseline, prior to discharge. The patient was provided with post-procedure discharge instructions, including a section on how to identify potential problems. Should any problems arise concerning this procedure, the patient was given instructions to immediately contact us, at any time, without hesitation. In any case, we plan to contact the patient by telephone for a follow-up status report regarding this interventional procedure.  Comments:  No additional relevant information.   Plan of care    Medications administered: We administered iohexol, lidocaine, diazepam, sodium chloride flush, ropivacaine (PF) 2 mg/mL (0.2%), dexamethasone, and diazepam.  Follow-up plan:   Return in about 4 weeks (around 07/28/2022) for Post Procedure Evaluation, virtual.      C7-T1 ESI 06/30/22   Recent Visits Date Type Provider Dept  06/15/22 Office Visit Gillis Santa, MD [facility] recent visits within past 90 days and meeting all other requirements Today's Visits Date Type Provider Dept  06/30/22 Procedure  visit Gillis Santa, MD [facility]  Showing today's visits and meeting all other requirements Future  Appointments Date Type Provider Dept  07/14/22 Appointment Gillis Santa, MD [facility]  Showing future appointments within next 90 days and meeting all other requirements   Disposition: Discharge home  Discharge (Date  Time): 06/30/2022; 0947 hrs.   Primary Care Physician: [name], NP Location: [facility] Note by: Gillis Santa, MD Date: 06/30/2022; Time: 10:05 AM  DISCLAIMER: Medicine is not an exact science. It has no guarantees or warranties. The decision to proceed with this intervention was based on the information collected from the patient. Conclusions were drawn from the patient's questionnaire, interview, and examination. Because information was provided in large part by the patient, it cannot be guaranteed that it has not been purposely or unconsciously manipulated or altered. Every effort has been made to obtain as much accurate, relevant, available data as possible. Always take into account that the treatment will also be dependent on availability of resources and existing treatment guidelines, considered by other Pain Management Specialists as being common knowledge and practice, at the time of the intervention. It is also important to point out that variation in procedural techniques and pharmacological choices are the acceptable norm. For Medico-Legal review purposes, the indications, contraindications, technique, and results of the these procedures should only be evaluated, judged and interpreted by a Board-Certified Interventional Pain Specialist with extensive familiarity and expertise in the same exact procedure and technique.

## 2022-06-30 NOTE — Progress Notes (Signed)
Safety precautions to be maintained throughout the outpatient stay will include: orient to surroundings, keep bed in low position, maintain call bell within reach at all times, provide assistance with transfer out of bed and ambulation.  

## 2022-07-01 ENCOUNTER — Telehealth: Payer: Self-pay | Admitting: *Deleted

## 2022-07-01 NOTE — Telephone Encounter (Signed)
Post procedure call;  voicemail left for patient.

## 2022-07-02 ENCOUNTER — Encounter: Payer: Self-pay | Admitting: Internal Medicine

## 2022-07-02 DIAGNOSIS — G4733 Obstructive sleep apnea (adult) (pediatric): Secondary | ICD-10-CM

## 2022-07-08 ENCOUNTER — Other Ambulatory Visit: Payer: Self-pay | Admitting: Family

## 2022-07-08 ENCOUNTER — Other Ambulatory Visit (HOSPITAL_COMMUNITY): Payer: Self-pay

## 2022-07-08 ENCOUNTER — Other Ambulatory Visit: Payer: Self-pay | Admitting: *Deleted

## 2022-07-08 DIAGNOSIS — C61 Malignant neoplasm of prostate: Secondary | ICD-10-CM

## 2022-07-13 ENCOUNTER — Other Ambulatory Visit (HOSPITAL_COMMUNITY): Payer: Self-pay

## 2022-07-14 ENCOUNTER — Encounter: Payer: Self-pay | Admitting: Student in an Organized Health Care Education/Training Program

## 2022-07-14 ENCOUNTER — Ambulatory Visit
Payer: No Typology Code available for payment source | Attending: Student in an Organized Health Care Education/Training Program | Admitting: Student in an Organized Health Care Education/Training Program

## 2022-07-14 DIAGNOSIS — G894 Chronic pain syndrome: Secondary | ICD-10-CM | POA: Diagnosis not present

## 2022-07-14 DIAGNOSIS — M5412 Radiculopathy, cervical region: Secondary | ICD-10-CM | POA: Diagnosis not present

## 2022-07-14 DIAGNOSIS — M5010 Cervical disc disorder with radiculopathy, unspecified cervical region: Secondary | ICD-10-CM | POA: Diagnosis not present

## 2022-07-14 NOTE — Progress Notes (Signed)
Patient: Marc Aguilar  Service Category: E/M  Provider: Gillis Santa, MD  DOB: Sep 17, 1961  DOS: 07/14/2022  Location: Office  MRN: [id number]  Setting: Ambulatory outpatient  Referring Provider: [name], NP  Type: Established Patient  Specialty: Interventional Pain Management  PCP: [name], NP  Location: Remote location  Delivery: TeleHealth     Virtual Encounter - Pain Management PROVIDER NOTE: Information contained herein reflects review and annotations entered in association with encounter. Interpretation of such information and data should be left to medically-trained personnel. Information provided to patient can be located elsewhere in the medical record under "Patient Instructions". Document created using STT-dictation technology, any transcriptional errors that may result from process are unintentional.    Contact & Pharmacy Preferred: [phone] Home: [phone] (home) Mobile: [phone] (mobile) E-mail: [email or web address]  [facility] [place] [address] [place] Phone: [phone] Fax: [phone]   Pre-screening  Marc Aguilar offered "in-person" vs "virtual" encounter. He indicated preferring virtual for this encounter.   Reason COVID-19*  Social distancing based on CDC and AMA recommendations.   I contacted [phone] on 07/14/2022 via telephone.      I clearly identified myself as 07/27/2022, MD. I verified that I was speaking with the correct person using two identifiers (Name: Marc Aguilar, and date of birth: 1961-04-07).  Consent I sought verbal advanced consent from 06/16/1961 for virtual visit interactions. I informed Marc Aguilar of possible security and privacy concerns, risks, and limitations associated with providing "not-in-person" medical evaluation and management services. I also informed Marc Aguilar of the availability of "in-person" appointments. Finally, I informed him that there  would be a charge for the virtual visit and that he could be  personally, fully or partially, financially responsible for it. Marc Aguilar expressed understanding and agreed to proceed.   Historic Elements   Marc Aguilar is a 61 y.o. year old, male patient evaluated today after our last contact on 06/30/2022. Marc Aguilar  has a past medical history of Anxiety, Arthritis, Cancer ([place]), Chicken pox, Depression, GERD (gastroesophageal reflux disease), Heart murmur, Hernia, abdominal, High cholesterol, History of gout, Hypertension, Migraine, Neck pain, OSA on CPAP, Pneumonia, Refusal of blood transfusions as patient is Jehovah's Witness, Seasonal allergies, and Type II diabetes mellitus ([place]). He also  has a past surgical history that includes Cardiac catheterization (02/28/2018); Wrist ganglion excision (Left, 1984); LEFT HEART CATH AND CORONARY ANGIOGRAPHY (N/A, 02/28/2018); Robot assisted laparoscopic radical prostatectomy (N/A, 11/19/2019); and Shoulder arthroscopy with rotator cuff repair and subacromial decompression (Right, 05/21/2022). Marc Aguilar has a current medication list which includes the following prescription(s): acetaminophen, albuterol, alcohol prep, amlodipine, atorvastatin, bupropion, citalopram, cyclobenzaprine, ezetimibe, lancets, multivitamin with minerals, omeprazole, oxycodone, oxycodone, tadalafil, and trazodone. He  reports that he quit smoking about 12 years ago. His smoking use included cigarettes. He has a 27.00 pack-year smoking history. He quit smokeless tobacco use about 27 years ago.  His smokeless tobacco use included chew. He reports current alcohol use of about 12.0 standard drinks of alcohol per week. He reports that he does not use drugs. Marc Aguilar is allergic to tape.   HPI  Today, he is being contacted for a post-procedure assessment.   Post-procedure evaluation   Type: Cervical Epidural Steroid injection (ESI) (Interlaminar) #1  Laterality: Midline  Level: C7-T1 Imaging:  Fluoroscopy-assisted DOS: 06/30/2022  Performed by: 07/13/2022, MD Anesthesia: Local anesthesia (1-2% Lidocaine) Anxiolysis: Oral Valium 10 mg Sedation:  minimal            Purpose: Diagnostic/Therapeutic Indications: Cervicalgia, cervical radicular pain, degenerative disc disease, severe enough to impact quality of life or function.  1. Cervical radiculitis   2. Chronic pain syndrome    NAS-11 score:   Pre-procedure: 5 /10   Post-procedure: 3 /10      Effectiveness:  Initial hour after procedure: 100 %  Subsequent 4-6 hours post-procedure: 100 %  Analgesia past initial 6 hours: 100 % (pain relief x 5 days and now the pain is gradually coming back.  pain is better but seems to be coming back.  also reports that he is having muscle spasm in right hand.)  Ongoing improvement:  Analgesic:  75% Function: Marc Aguilar reports improvement in function ROM: Marc Aguilar reports improvement in ROM   Laboratory Chemistry Profile   Renal Lab Results  Component Value Date   BUN 17 03/18/2022   CREATININE 0.92 03/18/2022   LABCREA 119 20/35/5974   BCR NOT APPLICABLE 16/38/4536   GFR 87.70 01/01/2021   GFRAA >60 11/20/2019   GFRNONAA >60 09/26/2020    Hepatic Lab Results  Component Value Date   AST 22 03/18/2022   ALT 16 03/18/2022   ALBUMIN 4.4 01/01/2021   ALKPHOS 78 01/01/2021   HCVAB NEGATIVE 10/02/2015   AMYLASE 111 09/30/2020   LIPASE 53.0 09/30/2020    Electrolytes Lab Results  Component Value Date   NA 138 03/18/2022   K 4.1 03/18/2022   CL 105 03/18/2022   CALCIUM 9.4 03/18/2022    Bone Lab Results  Component Value Date   VD25OH 34.21 11/14/2018    Inflammation (CRP: Acute Phase) (ESR: Chronic Phase) No results found for: "CRP", "ESRSEDRATE", "LATICACIDVEN"       Note: Above Lab results reviewed.   Assessment  The primary encounter diagnosis was Cervical radiculitis. Diagnoses of Cervical radicular pain, Herniation of cervical intervertebral disc with  radiculopathy, and Chronic pain syndrome were also pertinent to this visit.  Plan of Care  Good pain relief and functional response after previous cervical epidural steroid injection.  We will continue to monitor his symptoms, repeat as needed.  As needed order placed below.   Orders:  Orders Placed This Encounter  Procedures   Cervical Epidural Injection    Sedation: Patient's choice. Purpose: Diagnostic/Therapeutic Indication(s): Radiculitis and cervicalgia associater with cervical degenerative disc disease.    Standing Status:   Standing    Number of Occurrences:   2    Standing Expiration Date:   01/12/2023    Scheduling Instructions:     Procedure: Cervical Epidural Steroid Injection/Block     Level(s): C7-T1     Laterality: TBD     Timeframe: As soon as schedule allows    Order Specific Question:   Where will this procedure be performed?    Answer:   [facility]    Comments:   Marc Aguilar   Follow-up plan:   Return if symptoms worsen or fail to improve.     C7-T1 ESI 06/30/22    Recent Visits Date Type Provider Dept  06/30/22 Procedure visit Gillis Santa, MD [facility]  06/15/22 Office Visit Gillis Santa, MD [facility]  Showing recent visits within past 90 days and meeting all other requirements Today's Visits Date Type Provider Dept  07/14/22 Office Visit Gillis Santa, MD [facility]  Showing today's visits and meeting all other requirements Future Appointments No visits were found meeting these conditions. Showing future  appointments within next 90 days and meeting all other requirements  I discussed the assessment and treatment plan with the patient. The patient was provided an opportunity to ask questions and all were answered. The patient agreed with the plan and demonstrated an understanding of the instructions.  Patient advised to call back or seek an in-person evaluation if the symptoms or condition worsens.  Duration  of encounter: 15 minutes.  Note by: Gillis Santa, MD Date: 07/14/2022; Time: 3:36 PM

## 2022-07-22 ENCOUNTER — Telehealth: Payer: Self-pay

## 2022-07-22 ENCOUNTER — Other Ambulatory Visit: Payer: No Typology Code available for payment source

## 2022-07-22 DIAGNOSIS — C61 Malignant neoplasm of prostate: Secondary | ICD-10-CM

## 2022-07-22 NOTE — Telephone Encounter (Signed)
His insurance denied auth for the cesi because he didn't have  50 percent pain relief from the previous one for 6 weeks. They say it is not medically necessary to repeat the cesi for this reason.

## 2022-07-22 NOTE — Telephone Encounter (Signed)
I can resubmit, but the notes say he only got 5 days relief. They may not approve, but I can try.

## 2022-07-23 LAB — PSA: Prostate Specific Ag, Serum: <0.1 ng/mL (ref 0.0–4.0)

## 2022-07-27 ENCOUNTER — Ambulatory Visit (INDEPENDENT_AMBULATORY_CARE_PROVIDER_SITE_OTHER): Payer: No Typology Code available for payment source | Admitting: Urology

## 2022-07-27 VITALS — BP 159/103 | HR 92 | Ht 71.0 in | Wt 231.0 lb

## 2022-07-27 DIAGNOSIS — C61 Malignant neoplasm of prostate: Secondary | ICD-10-CM

## 2022-07-27 DIAGNOSIS — Z8546 Personal history of malignant neoplasm of prostate: Secondary | ICD-10-CM

## 2022-07-27 DIAGNOSIS — N5231 Erectile dysfunction following radical prostatectomy: Secondary | ICD-10-CM

## 2022-07-27 MED ORDER — TADALAFIL 20 MG PO TABS: 20.0000 mg | ORAL_TABLET | Freq: Every day | ORAL | 6 refills | Status: DC | PRN

## 2022-07-27 NOTE — Progress Notes (Signed)
07/27/2022 12:51 PM   Marc Aguilar [birth year] [id number]  Referring provider: [name], NP [place],  [place] [address]  Chief Complaint  Patient presents with   Prostate Cancer    HPI: 61 year old male with personal history of prostate cancer who returns today for routine annual follow-up.  He is s/p robotic assisted laparoscopic radical prostatectomy on 11/2019. Surgical pathology revealed Gleason 4+3 with > 5% of tertiary pattern 5 + EFE nonfocal - SB+ margin on the left posterior, neg lymph nodes; pT3a pN0.   PSA remains undetectable as of 07/22/2022.  He reports today that he only leaks with heavy lifting and straining.  He does use Cialis as needed for erectile dysfunction which works most of the time.  It is adequate for penetration.  He has no other concerns or complaints today.  PMH: Past Medical History:  Diagnosis Date   Anxiety    Arthritis    "knees and hips" (02/28/2018)   Cancer ([place])    prostate    Chicken pox    Depression    GERD (gastroesophageal reflux disease)    Heart murmur    Hernia, abdominal    High cholesterol    History of gout    Hypertension    Migraine    "probably monthly" (02/28/2018)   Neck pain    OSA on CPAP    Pneumonia    "once" (02/28/2018)   Refusal of blood transfusions as patient is Jehovah's Witness    Seasonal allergies    Type II diabetes mellitus ([place])     Surgical History: Past Surgical History:  Procedure Laterality Date   CARDIAC CATHETERIZATION  02/28/2018   LEFT HEART CATH AND CORONARY ANGIOGRAPHY N/A 02/28/2018   Procedure: LEFT HEART CATH AND CORONARY ANGIOGRAPHY;  Surgeon: Sherren Mocha, MD;  Location: [facility];  Service: Cardiovascular;  Laterality: N/A;   ROBOT ASSISTED LAPAROSCOPIC RADICAL PROSTATECTOMY N/A 11/19/2019   Procedure: XI ROBOTIC ASSISTED LAPAROSCOPIC RADICAL PROSTATECTOMY WITH LYMPH NODE DISSECTION;  Surgeon: Hollice Espy, MD;  Location: [facility];  Service:  Urology;  Laterality: N/A;   SHOULDER ARTHROSCOPY WITH ROTATOR CUFF REPAIR AND SUBACROMIAL DECOMPRESSION Right 05/21/2022   Procedure: Right shoulder arthroscopic rotator cuff repair (supraspinatus & subscapularis), subacromial decompression, and biceps tenodesis;  Surgeon: Leim Fabry, MD;  Location: [facility];  Service: Orthopedics;  Laterality: Right;   WRIST GANGLION EXCISION Left 1984    Home Medications:  Allergies as of 07/27/2022       Reactions   Tape Rash   Paper tape-blisters Paper tape-blisters        Medication List        Accurate as of July 27, 2022 12:51 PM. If you have any questions, ask your nurse or doctor.          acetaminophen 500 MG tablet Commonly known as: TYLENOL Take 2 tablets (1,000 mg total) by mouth every 8 (eight) hours.   albuterol 108 (90 Base) MCG/ACT inhaler Commonly known as: VENTOLIN HFA Inhale 2 puffs into the lungs every 6 (six) hours as needed for wheezing or shortness of breath.   Alcohol Prep Pads 1 each by Does not apply route 2 (two) times daily.   amLODipine 10 MG tablet Commonly known as: NORVASC Take 1 tablet (10 mg total) by mouth daily.   atorvastatin 20 MG tablet Commonly known as: LIPITOR Take 1 tablet (20 mg total) by mouth daily. What changed: when to take this   buPROPion 300  MG 24 hr tablet Commonly known as: Wellbutrin XL Take 1 tablet (300 mg total) by mouth daily.   citalopram 40 MG tablet Commonly known as: CELEXA TAKE 1 TABLET (40 MG TOTAL) BY MOUTH DAILY.   cyclobenzaprine 10 MG tablet Commonly known as: FLEXERIL Take 1 tablet (10 mg total) by mouth daily as needed for muscle spasms.   ezetimibe 10 MG tablet Commonly known as: Zetia Take 1 tablet (10 mg total) by mouth daily.   Lancets Misc 1 each by Does not apply route in the morning and at bedtime.   multivitamin with minerals tablet Take 1 tablet by mouth daily.   omeprazole 20 MG capsule Commonly known as: PRILOSEC Take 1 capsule  (20 mg total) by mouth daily.   oxyCODONE 5 MG immediate release tablet Commonly known as: Roxicodone Take 1-2 tablets (5-10 mg total) by mouth every 4 (four) hours as needed (pain).   oxyCODONE 5 MG immediate release tablet Commonly known as: Oxy IR/ROXICODONE Take 1-2 tablets (5-10 mg total) by mouth every 4 (four) hours as needed for pain.   tadalafil 20 MG tablet Commonly known as: CIALIS Take 1 tablet (20 mg total) by mouth daily as needed for erectile dysfunction.   traZODone 50 MG tablet Commonly known as: DESYREL Take 1 tablet (50 mg total) by mouth at bedtime as needed for sleep.        Allergies:  Allergies  Allergen Reactions   Tape Rash    Paper tape-blisters Paper tape-blisters    Family History: Family History  Problem Relation Age of Onset   Diabetes Mother    Hyperlipidemia Mother    Bladder Cancer Father    Liver cancer Sister    Diabetes Sister    Diabetes Maternal Aunt    Arthritis Maternal Grandmother    Diabetes Maternal Grandmother    Arthritis Maternal Grandfather    Arthritis Paternal Grandmother    Diabetes Brother    Heart disease Brother    Colon cancer Neg Hx     Social History:  reports that he quit smoking about 12 years ago. His smoking use included cigarettes. He has a 27.00 pack-year smoking history. He quit smokeless tobacco use about 27 years ago.  His smokeless tobacco use included chew. He reports current alcohol use of about 12.0 standard drinks of alcohol per week. He reports that he does not use drugs.   Physical Exam: BP (!) 159/103   Pulse 92   Ht '5\' 11"'$  (1.803 m)   Wt 231 lb (104.8 kg)   BMI 32.22 kg/m   Constitutional:  Alert and oriented, No acute distress. HEENT: [place] AT, moist mucus membranes.  Trachea midline, no masses. Cardiovascular: No clubbing, cyanosis, or edema. Neurologic: Grossly intact, no focal deficits, moving all 4 extremities. Psychiatric: Normal mood and affect.  Laboratory Data: Lab Results   Component Value Date   WBC 4.2 03/18/2022   HGB 14.0 03/18/2022   HCT 43.6 03/18/2022   MCV 84.3 03/18/2022   PLT 256 03/18/2022    Lab Results  Component Value Date   CREATININE 0.92 03/18/2022    Lab Results  Component Value Date   HGBA1C 5.8 (A) 03/18/2022    Assessment & Plan:    1. Prostate cancer ([place]) PSA remains undetectable, will continue to check by annually especially in the setting of a positive margin, he is agreeable this plan  - PSA; Future  2. 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  Hollice Espy, MD  [facility] [address] [place], [place] [address] [phone]

## 2022-08-23 ENCOUNTER — Ambulatory Visit
Payer: No Typology Code available for payment source | Attending: Student in an Organized Health Care Education/Training Program | Admitting: Student in an Organized Health Care Education/Training Program

## 2022-08-23 ENCOUNTER — Encounter: Payer: Self-pay | Admitting: Student in an Organized Health Care Education/Training Program

## 2022-08-23 VITALS — BP 143/92 | HR 111 | Temp 98.1°F | Resp 18 | Ht 71.0 in | Wt 230.0 lb

## 2022-08-23 DIAGNOSIS — M19011 Primary osteoarthritis, right shoulder: Secondary | ICD-10-CM | POA: Diagnosis present

## 2022-08-23 DIAGNOSIS — M25511 Pain in right shoulder: Secondary | ICD-10-CM | POA: Diagnosis not present

## 2022-08-23 DIAGNOSIS — M12811 Other specific arthropathies, not elsewhere classified, right shoulder: Secondary | ICD-10-CM | POA: Diagnosis not present

## 2022-08-23 DIAGNOSIS — M5010 Cervical disc disorder with radiculopathy, unspecified cervical region: Secondary | ICD-10-CM | POA: Insufficient documentation

## 2022-08-23 DIAGNOSIS — G8929 Other chronic pain: Secondary | ICD-10-CM | POA: Insufficient documentation

## 2022-08-23 DIAGNOSIS — M5412 Radiculopathy, cervical region: Secondary | ICD-10-CM | POA: Diagnosis not present

## 2022-08-23 NOTE — Patient Instructions (Signed)
Do not eat or drink for 8 hours prior to procedure  Bring a driver Take blood pressure medication the morning of procedure with a small sip of water Switch  Aspirin from 325 mg to 81 mg for one week prior to procedure

## 2022-08-23 NOTE — Progress Notes (Signed)
Safety precautions to be maintained throughout the outpatient stay will include: orient to surroundings, keep bed in low position, maintain call bell within reach at all times, provide assistance with transfer out of bed and ambulation.  

## 2022-08-23 NOTE — Progress Notes (Signed)
PROVIDER NOTE: Information contained herein reflects review and annotations entered in association with encounter. Interpretation of such information and data should be left to medically-trained personnel. Information provided to patient can be located elsewhere in the medical record under "Patient Instructions". Document created using STT-dictation technology, any transcriptional errors that may result from process are unintentional.    Patient: Marc Aguilar  Service Category: E/M  Provider: Gillis Santa, MD  DOB: May 21, 1961  DOS: 08/23/2022  Referring Provider: [name], NP  MRN: [id number]  Specialty: Interventional Pain Management  PCP: [name], NP  Type: Established Patient  Setting: Ambulatory outpatient    Location: Office  Delivery: Face-to-face     HPI  Marc Aguilar, a 61 y.o. year old male, is here today because of his Cervical radiculitis [M54.12]. Marc Aguilar primary complain today is Neck Pain Last encounter: My last encounter with him was on 07/14/2022. Pertinent problems: Marc Aguilar has Migraines; Anxiety and depression; Cervical radiculitis; Herniation of cervical intervertebral disc with radiculopathy; Chronic pain syndrome; and Rotator cuff arthropathy of right shoulder on their pertinent problem list. Pain Assessment: Severity of Chronic pain is reported as a 5 /10. Location: Neck Posterior, Right, Left/Radaites from back of neck into arms bilateral (causes numbness in the hands). Also radites from neck up to right side of head causing migraines.. Onset: More than a month ago. Quality: Constant, Dull, Aching ("when I get headaches it is sharp". tense pain). Timing: Constant. Modifying factor(s): Denies. Vitals:  height is _0  (1.803 m) and weight is 230 lb (104.3 kg). His temporal temperature is 98.1 F (36.7 C). His blood pressure is 143/92 (abnormal) and his pulse is 111 (abnormal). His respiration is 18 and oxygen saturation is 97%.   Reason for encounter:  evaluation of worsening, or previously known (established) problem.   Marc Aguilar presents today with increased neck pain and headaches.  He states that he was doing very well after his cervical epidural steroid injection that was done 06/30/2022.  He has been working with physical therapy.  He is also having increased posterior right shoulder pain overlying his suprascapular notch.  We discussed repeating cervical epidural steroid injection and also performing a right diagnostic suprascapular nerve block.  I encouraged him to continue working with physical therapy.   Constitutional: Denies any fever or chills Gastrointestinal: No reported hemesis, hematochezia, vomiting, or acute GI distress Musculoskeletal:  Cervical spine pain, posterior dominant headaches, bilateral arm pain with numbness Neurological: No reported episodes of acute onset apraxia, aphasia, dysarthria, agnosia, amnesia, paralysis, loss of coordination, or loss of consciousness  Medication Review  Alcohol Prep, Lancets, acetaminophen, albuterol, amLODipine, atorvastatin, buPROPion, citalopram, cyclobenzaprine, ezetimibe, multivitamin with minerals, omeprazole, oxyCODONE, tadalafil, and traZODone  History Review  Allergy: Marc Aguilar is allergic to tape. Drug: Marc Aguilar  reports no history of drug use. Alcohol:  reports current alcohol use of about 12.0 standard drinks of alcohol per week. Tobacco:  reports that he quit smoking about 12 years ago. His smoking use included cigarettes. He has a 27.00 pack-year smoking history. He quit smokeless tobacco use about 27 years ago.  His smokeless tobacco use included chew. Social: Marc Aguilar  reports that he quit smoking about 12 years ago. His smoking use included cigarettes. He has a 27.00 pack-year smoking history. He quit smokeless tobacco use about 27 years ago.  His smokeless tobacco use included chew. He reports current alcohol use of about 12.0 standard drinks of alcohol per week. He reports  that he does not use drugs. Medical:  has a past medical history of Anxiety, Arthritis, Cancer ([place]), Chicken pox, Depression, GERD (gastroesophageal reflux disease), Heart murmur, Hernia, abdominal, High cholesterol, History of gout, Hypertension, Migraine, Neck pain, OSA on CPAP, Pneumonia, Refusal of blood transfusions as patient is Jehovah's Witness, Seasonal allergies, and Type II diabetes mellitus ([place]). Surgical: Marc Aguilar  has a past surgical history that includes Cardiac catheterization (02/28/2018); Wrist ganglion excision (Left, 1984); LEFT HEART CATH AND CORONARY ANGIOGRAPHY (N/A, 02/28/2018); Robot assisted laparoscopic radical prostatectomy (N/A, 11/19/2019); and Shoulder arthroscopy with rotator cuff repair and subacromial decompression (Right, 05/21/2022). Family: family history includes Arthritis in his maternal grandfather, maternal grandmother, and paternal grandmother; Bladder Cancer in his father; Diabetes in his brother, maternal aunt, maternal grandmother, mother, and sister; Heart disease in his brother; Hyperlipidemia in his mother; Liver cancer in his sister.  Laboratory Chemistry Profile   Renal Lab Results  Component Value Date   BUN 17 03/18/2022   CREATININE 0.92 03/18/2022   LABCREA 119 62/69/4854   BCR NOT APPLICABLE 62/70/3500   GFR 87.70 01/01/2021   GFRAA >60 11/20/2019   GFRNONAA >60 09/26/2020    Hepatic Lab Results  Component Value Date   AST 22 03/18/2022   ALT 16 03/18/2022   ALBUMIN 4.4 01/01/2021   ALKPHOS 78 01/01/2021   HCVAB NEGATIVE 10/02/2015   AMYLASE 111 09/30/2020   LIPASE 53.0 09/30/2020    Electrolytes Lab Results  Component Value Date   NA 138 03/18/2022   K 4.1 03/18/2022   CL 105 03/18/2022   CALCIUM 9.4 03/18/2022    Bone Lab Results  Component Value Date   VD25OH 34.21 11/14/2018    Inflammation (CRP: Acute Phase) (ESR: Chronic Phase) No results found for: "CRP", "ESRSEDRATE", "LATICACIDVEN"       Note: Above Lab results  reviewed.  Recent Imaging Review  Cervical Imaging: Cervical MR wo contrast: Results for orders placed during the hospital encounter of 03/10/22   MR Cervical Spine Wo Contrast   Narrative CLINICAL DATA:  Provided history: Cervical radiculitis. Cervical radiculopathy, no red flags. Additional history provided by scanning technologist: Patient reports neck pain with bilateral shoulder pain and arm numbness (worse on right side), symptoms for 2 years.   EXAM: MRI CERVICAL SPINE WITHOUT CONTRAST   TECHNIQUE: Multiplanar, multisequence MR imaging of the cervical spine was performed. No intravenous contrast was administered.   COMPARISON:  Cervical spine radiographs 08/13/2021.   FINDINGS: Alignment: Straightening of the expected cervical lordosis. Trace C2-C3 grade 1 anterolisthesis. Trace C4-C5 grade 1 retrolisthesis.   Vertebrae: Vertebral body height is maintained. No significant marrow edema or focal suspicious osseous lesion.   Cord: No signal abnormality identified within the cervical spinal cord.   Posterior Fossa, vertebral arteries, paraspinal tissues: No abnormality identified within included portions of the posterior fossa. Flow voids preserved within the imaged cervical vertebral arteries. No paraspinal mass or collection.   Disc levels:   Multilevel disc degeneration, greatest at C4-C5 (mild-to-moderate) and C5-C6 (moderate).   C2-C3: Trace grade 1 anterolisthesis. No significant disc herniation or stenosis.   C3-C4: Small central disc protrusion. Uncovertebral hypertrophy (predominantly on the right). The disc protrusion results in mild focal effacement of the ventral thecal sac and may contact the ventral spinal cord. Mild right neural foraminal narrowing.   C4-C5: Trace grade 1 retrolisthesis. Disc bulge. Uncovertebral hypertrophy (predominantly on the right). The disc protrusion effaces the ventral thecal sac, mildly narrowing the spinal canal and  contacting the ventral  spinal cord. Moderate right neural foraminal narrowing.   C5-C6: Disc bulge with left greater than right uncovertebral hypertrophy. Mild relative spinal canal narrowing (without significant spinal cord mass effect). Bilateral neural foraminal narrowing (moderate right, moderate/severe left).   C6-C7: No significant disc herniation or stenosis.   C7-T1: Mild facet arthrosis on the left. No significant disc herniation or stenosis.   IMPRESSION: Cervical spondylosis, as outlined.   No more than mild spinal canal stenosis.   Multilevel foraminal stenosis, as detailed and greatest on the right at C4-C5 (moderate) and bilaterally at C5-C6 (moderate right, moderate/severe left).   Disc degeneration is greatest at C4-C5 (mild-to-moderate) and C5-C6 (moderate).   Nonspecific straightening of the expected cervical lordosis.     Electronically Signed By: Kellie Simmering D.O. On: 03/10/2022 18:57  Physical Exam  General appearance: Well nourished, well developed, and well hydrated. In no apparent acute distress Mental status: Alert, oriented x 3 (person, place, & time)       Respiratory: No evidence of acute respiratory distress Eyes: PERLA Vitals: BP (!) 143/92   Pulse (!) 111   Temp 98.1 F (36.7 C) (Temporal)   Resp 18   Ht _0  (1.803 m)   Wt 230 lb (104.3 kg)   SpO2 97%   BMI 32.08 kg/m  BMI: Estimated body mass index is 32.08 kg/m as calculated from the following:   Height as of this encounter: _1  (1.803 m).   Weight as of this encounter: 230 lb (104.3 kg). Ideal: Ideal body weight: 75.3 kg (166 lb 0.1 oz) Adjusted ideal body weight: 86.9 kg (191 lb 9.7 oz)  Cervical Spine Area Exam  Skin & Axial Inspection:  Right arm in shoulder sling Alignment: Symmetrical Functional ROM: Pain restricted ROM      Stability: No instability detected Muscle Tone/Strength: Functionally intact. No obvious neuro-muscular anomalies detected. Sensory  (Neurological): Neurogenic pain pattern Palpation: No palpable anomalies             Upper Extremity (UE) Exam      Side: Right upper extremity   Side: Left upper extremity  Skin & Extremity Inspection: Skin color, temperature, and hair growth are WNL. No peripheral edema or cyanosis. No masses, redness, swelling, asymmetry, or associated skin lesions. No contractures.   Skin & Extremity Inspection: Skin color, temperature, and hair growth are WNL. No peripheral edema or cyanosis. No masses, redness, swelling, asymmetry, or associated skin lesions. No contractures.  Functional ROM: Pain restricted ROM for shoulder and elbow   Functional ROM: Unrestricted ROM          Muscle Tone/Strength: Functionally intact. No obvious neuro-muscular anomalies detected.   Muscle Tone/Strength: Functionally intact. No obvious neuro-muscular anomalies detected.  Sensory (Neurological): Dermatomal pain pattern           Sensory (Neurological): Unimpaired          Palpation: No palpable anomalies               Palpation: No palpable anomalies              Provocative Test(s):  Phalen's test: deferred Tinel's test: deferred Apley's scratch test (touch opposite shoulder):  Action 1 (Across chest): decreased Action 2 (Overhead): decreased Action 3 (LB reach): decreased     Provocative Test(s):  Phalen's test: deferred Tinel's test: deferred Apley's scratch test (touch opposite shoulder):  Action 1 (Across chest): deferred Action 2 (Overhead): deferred Action 3 (LB reach): deferred      Assessment  Diagnosis Status  1. Cervical radiculitis   2. Cervical radicular pain   3. Herniation of cervical intervertebral disc with radiculopathy   4. Rotator cuff arthropathy of right shoulder   5. Chronic right shoulder pain   6. Primary osteoarthritis of right shoulder    Flare-up Persistent Persistent   Updated Problems: Problem  Rotator Cuff Arthropathy of Right Shoulder  Cervical Radiculitis     Plan  of Care  1. Cervical radiculitis - Cervical Epidural Injection; Future  2. Cervical radicular pain - Cervical Epidural Injection; Future  3. Herniation of cervical intervertebral disc with radiculopathy - Cervical Epidural Injection; Future  4. Rotator cuff arthropathy of right shoulder - SUPRASCAPULAR NERVE BLOCK; Future  5. Chronic right shoulder pain - SUPRASCAPULAR NERVE BLOCK; Future  6. Primary osteoarthritis of right shoulder - SUPRASCAPULAR NERVE BLOCK; Future  Continue with physical therapy.   Orders:  Orders Placed This Encounter  Procedures   Cervical Epidural Injection    Sedation: PO Valium Purpose: Diagnostic/Therapeutic Indication(s): Radiculitis and cervicalgia associater with cervical degenerative disc disease.    Standing Status:   Future    Standing Expiration Date:   11/23/2022    Scheduling Instructions:     Procedure: Cervical Epidural Steroid Injection/Block     Level(s): C7-T1     Laterality: TBD     Timeframe: As soon as schedule allows    Order Specific Question:   Where will this procedure be performed?    Answer:   [facility]    Comments:   Zakhia Seres   SUPRASCAPULAR NERVE BLOCK    For shoulder pain.    Standing Status:   Future    Standing Expiration Date:   11/23/2022    Scheduling Instructions:     Purpose: Therapeutic     Laterality: RIGHT     Level(s): Suprascapular notch     Sedation: PO Valium 10 mg     Scheduling Timeframe: As permitted by the schedule    Order Specific Question:   Where will this procedure be performed?    Answer:   [facility]   Follow-up plan:   Return in about 2 weeks (around 09/06/2022) for C-ESI + R SSNB, in clinic (PO Valium 10 mg).     C7-T1 ESI 06/30/22     Recent Visits Date Type Provider Dept  07/14/22 Office Visit Gillis Santa, MD [facility]  06/30/22 Procedure visit Gillis Santa, MD [facility]  06/15/22 Office Visit Gillis Santa, MD [facility]   Showing recent visits within past 90 days and meeting all other requirements Today's Visits Date Type Provider Dept  08/23/22 Office Visit Gillis Santa, MD [facility]  Showing today's visits and meeting all other requirements Future Appointments No visits were found meeting these conditions. Showing future appointments within next 90 days and meeting all other requirements  I discussed the assessment and treatment plan with the patient. The patient was provided an opportunity to ask questions and all were answered. The patient agreed with the plan and demonstrated an understanding of the instructions.  Patient advised to call back or seek an in-person evaluation if the symptoms or condition worsens.  Duration of encounter: 110mnutes.  Total time on encounter, as per AMA guidelines included both the face-to-face and non-face-to-face time personally spent by the physician and/or other qualified health care professional(s) on the day of the encounter (includes time in activities that require the physician or other qualified health care professional and does not  include time in activities normally performed by clinical staff). Physician's time may include the following activities when performed: preparing to see the patient (eg, review of tests, pre-charting review of records) obtaining and/or reviewing separately obtained history performing a medically appropriate examination and/or evaluation counseling and educating the patient/family/caregiver ordering medications, tests, or procedures referring and communicating with other health care professionals (when not separately reported) documenting clinical information in the electronic or other health record independently interpreting results (not separately reported) and communicating results to the patient/ family/caregiver care coordination (not separately reported)  Note by: Gillis Santa, MD Date: 08/23/2022; Time: 3:05 PM

## 2022-08-24 ENCOUNTER — Encounter: Payer: Self-pay | Admitting: Adult Health

## 2022-08-24 ENCOUNTER — Ambulatory Visit (INDEPENDENT_AMBULATORY_CARE_PROVIDER_SITE_OTHER): Payer: No Typology Code available for payment source | Admitting: Adult Health

## 2022-08-24 VITALS — BP 140/72 | HR 100 | Temp 98.0°F | Ht 71.0 in | Wt 238.8 lb

## 2022-08-24 DIAGNOSIS — E6609 Other obesity due to excess calories: Secondary | ICD-10-CM

## 2022-08-24 DIAGNOSIS — Z6832 Body mass index (BMI) 32.0-32.9, adult: Secondary | ICD-10-CM

## 2022-08-24 DIAGNOSIS — G4733 Obstructive sleep apnea (adult) (pediatric): Secondary | ICD-10-CM

## 2022-08-24 NOTE — Progress Notes (Signed)
$'@Patient'Marc Aguilar$  ID: Marc Aguilar, male    DOB: April 21, 1961, 61 y.o.   MRN: [id number]  Chief Complaint  Patient presents with   sleep consult    Referring provider: [name], NP  HPI: 61 year old male seen for sleep consult 08/24/2022 to reestablish for sleep apnea Former patient of Dr. Alva Garnet.  Last seen in the office in 2018  TEST/EVENTS :  Home sleep study January 01, 2017 moderate obstructive sleep apnea with AHI of 22/hour, severe sleep apnea in the supine position with AHI of 30/hour, SPO2 low at 81%.  08/24/2022 Sleep Consult  Patient presents today for a sleep consult to establish for sleep apnea.  Patient was diagnosed with sleep apnea around ~2000 while living in [place]. Was started on CPAP.  Has worn on/off over the years.   Patient has had snoring, restless sleep and daytime sleepiness.  He was set up for home sleep study and January 02, 2007 that showed moderate sleep apnea with AHI of 22/hour and SPO2 low at 81%.  Increase AHI in supine position at 30/hour.  Patient says he tries to wear his CPAP but is very uncomfortable.  He feels the pressure is too high.  Typically goes to bed between 8 to 11 PM.  Takes only about 10 to 15 minutes to go to sleep.  Is up at 8 AM or 10 AM.  Weight is up about 8 pounds over the last 2 years.  Current weight is at 238 pounds with a BMI of 33. Epworth score is  21 out of 24.  Gets very sleepy if he sits down, watches TV, as a passenger car and while eating.Marland Kitchen  He has very loud snoring and feels tired.  CPAP download shows very poor compliance with 13% usage.  Daily average usage at 8 hours.  Patient is on auto CPAP 5 to 20 cm H2O.  Daily average pressure at 10 cm H2O.  AHI 0.6/hr. Caffeine intake is 1 to 2 cups daily.  No removable dental work.  No history of congestive heart failure or stroke.  No symptom suspicious for cataplexy or sleep paralysis. Takes Trazodone for insomnia each night .   Social history patient is married.  He is a  Nurse, children's.  Quit smoking in 2012.  Drinks about 8 beers daily.  No drug use. Uses nasal mask, likes it.   Family history positive for cancer.  Past Surgical History:  Procedure Laterality Date   CARDIAC CATHETERIZATION  02/28/2018   LEFT HEART CATH AND CORONARY ANGIOGRAPHY N/A 02/28/2018   Procedure: LEFT HEART CATH AND CORONARY ANGIOGRAPHY;  Surgeon: Sherren Mocha, MD;  Location: [facility];  Service: Cardiovascular;  Laterality: N/A;   ROBOT ASSISTED LAPAROSCOPIC RADICAL PROSTATECTOMY N/A 11/19/2019   Procedure: XI ROBOTIC ASSISTED LAPAROSCOPIC RADICAL PROSTATECTOMY WITH LYMPH NODE DISSECTION;  Surgeon: Hollice Espy, MD;  Location: [facility];  Service: Urology;  Laterality: N/A;   SHOULDER ARTHROSCOPY WITH ROTATOR CUFF REPAIR AND SUBACROMIAL DECOMPRESSION Right 05/21/2022   Procedure: Right shoulder arthroscopic rotator cuff repair (supraspinatus & subscapularis), subacromial decompression, and biceps tenodesis;  Surgeon: Leim Fabry, MD;  Location: [facility];  Service: Orthopedics;  Laterality: Right;   WRIST GANGLION EXCISION Left 1984     Allergies  Allergen Reactions   Tape Rash    Paper tape-blisters Paper tape-blisters    Immunization History  Administered Date(s) Administered   Influenza,inj,Quad PF,6+ Mos 07/20/2015, 07/19/2018   Influenza-Unspecified 07/14/2019, 07/12/2020, 07/31/2021   PFIZER(Purple Top)SARS-COV-2 Vaccination 10/15/2019, 11/06/2019,  07/26/2020   Pneumococcal Polysaccharide-23 07/29/2014, 11/14/2018   Tdap 10/26/2016    Past Medical History:  Diagnosis Date   Anxiety    Arthritis    "knees and hips" (02/28/2018)   Cancer (HCC)    prostate    Chicken pox    Depression    GERD (gastroesophageal reflux disease)    Heart murmur    Hernia, abdominal    High cholesterol    History of gout    Hypertension    Migraine    "probably monthly" (02/28/2018)   Neck pain    OSA on CPAP    Pneumonia    "once" (02/28/2018)   Refusal of blood  transfusions as patient is Jehovah's Witness    Seasonal allergies    Type II diabetes mellitus ([place])     Tobacco History: Social History   Tobacco Use  Smoking Status Former   Packs/day: 1.00   Years: 27.00   Total pack years: 27.00   Types: Cigarettes   Quit date: 10/18/2009   Years since quitting: 12.8  Smokeless Tobacco Former   Types: Chew   Quit date: 10/18/1994   Counseling given: Not Answered   Outpatient Medications Prior to Visit  Medication Sig Dispense Refill   acetaminophen (TYLENOL) 500 MG tablet Take 2 tablets (1,000 mg total) by mouth every 8 (eight) hours. 90 tablet 2   albuterol (VENTOLIN HFA) 108 (90 Base) MCG/ACT inhaler Inhale 2 puffs into the lungs every 6 (six) hours as needed for wheezing or shortness of breath. 1 Inhaler 0   Alcohol Swabs (ALCOHOL PREP) PADS 1 each by Does not apply route 2 (two) times daily. 200 each 3   amLODipine (NORVASC) 10 MG tablet Take 1 tablet (10 mg total) by mouth daily. 90 tablet 1   atorvastatin (LIPITOR) 20 MG tablet Take 1 tablet (20 mg total) by mouth daily. (Patient taking differently: Take 20 mg by mouth at bedtime.) 90 tablet 0   buPROPion (WELLBUTRIN XL) 300 MG 24 hr tablet Take 1 tablet (300 mg total) by mouth daily. 90 tablet 0   citalopram (CELEXA) 40 MG tablet TAKE 1 TABLET (40 MG TOTAL) BY MOUTH DAILY. 90 tablet 0   cyclobenzaprine (FLEXERIL) 10 MG tablet Take 1 tablet (10 mg total) by mouth daily as needed for muscle spasms. 90 tablet 0   ezetimibe (ZETIA) 10 MG tablet Take 1 tablet (10 mg total) by mouth daily. 90 tablet 0   Lancets MISC 1 each by Does not apply route in the morning and at bedtime. 200 each 2   Multiple Vitamins-Minerals (MULTIVITAMIN WITH MINERALS) tablet Take 1 tablet by mouth daily.     omeprazole (PRILOSEC) 20 MG capsule Take 1 capsule (20 mg total) by mouth daily. 90 capsule 0   oxyCODONE (OXY IR/ROXICODONE) 5 MG immediate release tablet Take 1-2 tablets (5-10 mg total) by mouth every 4 (four)  hours as needed for pain. 30 tablet 0   oxyCODONE (ROXICODONE) 5 MG immediate release tablet Take 1-2 tablets (5-10 mg total) by mouth every 4 (four) hours as needed (pain). 30 tablet 0   tadalafil (CIALIS) 20 MG tablet Take 1 tablet (20 mg total) by mouth daily as needed for erectile dysfunction. 10 tablet 6   traZODone (DESYREL) 50 MG tablet Take 1 tablet (50 mg total) by mouth at bedtime as needed for sleep. 90 tablet 0   No facility-administered medications prior to visit.     Review of Systems:   Constitutional:   No  weight loss, night sweats,  Fevers, chills, +fatigue, or  lassitude.  HEENT:   No headaches,  Difficulty swallowing,  Tooth/dental problems, or  Sore throat,                No sneezing, itching, ear ache, nasal congestion, post nasal drip,   CV:  No chest pain,  Orthopnea, PND, swelling in lower extremities, anasarca, dizziness, palpitations, syncope.   GI  No heartburn, indigestion, abdominal pain, nausea, vomiting, diarrhea, change in bowel habits, loss of appetite, bloody stools.   Resp: No shortness of breath with exertion or at rest.  No excess mucus, no productive cough,  No non-productive cough,  No coughing up of blood.  No change in color of mucus.  No wheezing.  No chest wall deformity  Skin: no rash or lesions.  GU: no dysuria, change in color of urine, no urgency or frequency.  No flank pain, no hematuria   MS:  No joint pain or swelling.  No decreased range of motion.  No back pain.    Physical Exam  BP (!) 140/72 (BP Location: Left Arm, Cuff Size: Normal)   Pulse 100   Temp 98 F (36.7 C) (Temporal)   Ht '5\' 11"'$  (1.803 m)   Wt 238 lb 12.8 oz (108.3 kg)   SpO2 98%   BMI 33.31 kg/m   GEN: A/Ox3; pleasant , NAD, well nourished    HEENT:  [place]/AT,  NOSE-clear, THROAT-clear, no lesions, no postnasal drip or exudate noted.  Class III-IV NP airway  NECK:  Supple w/ fair ROM; no JVD; normal carotid impulses w/o bruits; no thyromegaly or nodules  palpated; no lymphadenopathy.    RESP  Clear  Marc Aguilar & A; w/o, wheezes/ rales/ or rhonchi. no accessory muscle use, no dullness to percussion  CARD:  RRR, no m/r/g, no peripheral edema, pulses intact, no cyanosis or clubbing.  GI:   Soft & nt; nml bowel sounds; no organomegaly or masses detected.   Musco: Warm bil, no deformities or joint swelling noted.   Neuro: alert, no focal deficits noted.    Skin: Warm, no lesions or rashes    Lab Results:   BNP No results found for: "BNP"  ProBNP No results found for: "PROBNP"  Imaging:       No data to display          No results found for: "NITRICOXIDE"      Assessment & Plan:   OSA (obstructive sleep apnea) Longstanding moderate to severe sleep apnea.  Patient's had difficulty tolerating CPAP.  We will make CPAP adjustments for comfort. Helpful hints given on CPAP.  Patient education given. Advised to use caution with sedating medications and substances .   - discussed how weight can impact sleep and risk for sleep disordered breathing - discussed options to assist with weight loss: combination of diet modification, cardiovascular and strength training exercises   - had an extensive discussion regarding the adverse health consequences related to untreated sleep disordered breathing - specifically discussed the risks for hypertension, coronary artery disease, cardiac dysrhythmias, cerebrovascular disease, and diabetes - lifestyle modification discussed   - discussed how sleep disruption can increase risk of accidents, particularly when driving - safe driving practices were discussed   Plan  Patient Instructions  Restart CPAP At bedtime , wear all night long for at least 6 hr or more  Healthy sleep regimen  Work on healthy weight loss  Do not drive if sleepy  Saline nasal rinses Twice  daily   Saline nasal gel At bedtime   Decrease CPAP pressure 5 to 12 cm H2O.  Follow up in 6 weeks and As needed      Class 1  obesity due to excess calories with serious comorbidity and body mass index (BMI) of 32.0 to 32.9 in adult Healthy weight loss      Rexene Edison, NP 08/24/2022

## 2022-08-24 NOTE — Assessment & Plan Note (Signed)
Healthy weight loss 

## 2022-08-24 NOTE — Assessment & Plan Note (Addendum)
Longstanding moderate to severe sleep apnea.  Patient's had difficulty tolerating CPAP.  We will make CPAP adjustments for comfort. Helpful hints given on CPAP.  Patient education given. Advised to use caution with sedating medications and substances .   - discussed how weight can impact sleep and risk for sleep disordered breathing - discussed options to assist with weight loss: combination of diet modification, cardiovascular and strength training exercises   - had an extensive discussion regarding the adverse health consequences related to untreated sleep disordered breathing - specifically discussed the risks for hypertension, coronary artery disease, cardiac dysrhythmias, cerebrovascular disease, and diabetes - lifestyle modification discussed   - discussed how sleep disruption can increase risk of accidents, particularly when driving - safe driving practices were discussed   Plan  Patient Instructions  Restart CPAP At bedtime , wear all night long for at least 6 hr or more  Healthy sleep regimen  Work on healthy weight loss  Do not drive if sleepy  Saline nasal rinses Twice daily   Saline nasal gel At bedtime   Decrease CPAP pressure 5 to 12 cm H2O.  Follow up in 6 weeks and As needed

## 2022-08-24 NOTE — Patient Instructions (Signed)
Restart CPAP At bedtime , wear all night long for at least 6 hr or more  Healthy sleep regimen  Work on healthy weight loss  Do not drive if sleepy  Saline nasal rinses Twice daily   Saline nasal gel At bedtime   Decrease CPAP pressure 5 to 12 cm H2O.  Follow up in 6 weeks and As needed

## 2022-08-25 NOTE — Progress Notes (Signed)
Reviewed and agree with assessment/plan.   Chesley Mires, MD [facility] 08/25/2022, 8:13 AM Pager:  [phone]

## 2022-10-04 ENCOUNTER — Ambulatory Visit
Admission: RE | Admit: 2022-10-04 | Discharge: 2022-10-04 | Disposition: A | Discharge status: Home / Self Care | Payer: No Typology Code available for payment source | Source: Ambulatory Visit | Attending: Student in an Organized Health Care Education/Training Program | Admitting: Student in an Organized Health Care Education/Training Program

## 2022-10-04 ENCOUNTER — Encounter: Payer: Self-pay | Admitting: Student in an Organized Health Care Education/Training Program

## 2022-10-04 ENCOUNTER — Telehealth: Payer: Self-pay | Admitting: Internal Medicine

## 2022-10-04 ENCOUNTER — Other Ambulatory Visit: Payer: Self-pay

## 2022-10-04 ENCOUNTER — Ambulatory Visit
Payer: No Typology Code available for payment source | Attending: Student in an Organized Health Care Education/Training Program | Admitting: Student in an Organized Health Care Education/Training Program

## 2022-10-04 VITALS — BP 131/99 | HR 97 | Temp 97.3°F | Resp 18 | Ht 71.0 in | Wt 233.0 lb

## 2022-10-04 DIAGNOSIS — M25511 Pain in right shoulder: Secondary | ICD-10-CM | POA: Insufficient documentation

## 2022-10-04 DIAGNOSIS — M12811 Other specific arthropathies, not elsewhere classified, right shoulder: Secondary | ICD-10-CM | POA: Insufficient documentation

## 2022-10-04 DIAGNOSIS — M5412 Radiculopathy, cervical region: Secondary | ICD-10-CM | POA: Insufficient documentation

## 2022-10-04 DIAGNOSIS — M19011 Primary osteoarthritis, right shoulder: Secondary | ICD-10-CM | POA: Insufficient documentation

## 2022-10-04 DIAGNOSIS — M5010 Cervical disc disorder with radiculopathy, unspecified cervical region: Secondary | ICD-10-CM | POA: Insufficient documentation

## 2022-10-04 DIAGNOSIS — G8929 Other chronic pain: Secondary | ICD-10-CM | POA: Insufficient documentation

## 2022-10-04 MED ORDER — ROPIVACAINE HCL 2 MG/ML IJ SOLN
1.0000 mL | Freq: Once | INTRAMUSCULAR | Status: AC
  Administered 2022-10-04: 1 mL via EPIDURAL

## 2022-10-04 MED ORDER — SODIUM CHLORIDE (PF) 0.9 % IJ SOLN
INTRAMUSCULAR | Status: AC
  Filled 2022-10-04: qty 10

## 2022-10-04 MED ORDER — LIDOCAINE HCL 2 % IJ SOLN
INTRAMUSCULAR | Status: AC
  Filled 2022-10-04: qty 20

## 2022-10-04 MED ORDER — DEXAMETHASONE SODIUM PHOSPHATE 10 MG/ML IJ SOLN
10.0000 mg | Freq: Once | INTRAMUSCULAR | Status: AC
  Administered 2022-10-04: 10 mg

## 2022-10-04 MED ORDER — DIAZEPAM 5 MG PO TABS
10.0000 mg | ORAL_TABLET | ORAL | Status: AC
  Administered 2022-10-04: 10 mg via ORAL

## 2022-10-04 MED ORDER — SODIUM CHLORIDE 0.9% FLUSH
1.0000 mL | Freq: Once | INTRAVENOUS | Status: AC
  Administered 2022-10-04: 1 mL

## 2022-10-04 MED ORDER — ROPIVACAINE HCL 2 MG/ML IJ SOLN
4.0000 mL | Freq: Once | INTRAMUSCULAR | Status: AC
  Administered 2022-10-04: 4 mL via INTRA_ARTICULAR

## 2022-10-04 MED ORDER — IOHEXOL 180 MG/ML  SOLN
10.0000 mL | Freq: Once | INTRAMUSCULAR | Status: AC
  Administered 2022-10-04: 10 mL via EPIDURAL
  Filled 2022-10-04: qty 20

## 2022-10-04 MED ORDER — LIDOCAINE HCL 2 % IJ SOLN
20.0000 mL | Freq: Once | INTRAMUSCULAR | Status: AC
  Administered 2022-10-04: 400 mg

## 2022-10-04 MED ORDER — DEXAMETHASONE SODIUM PHOSPHATE 10 MG/ML IJ SOLN
INTRAMUSCULAR | Status: AC
  Filled 2022-10-04: qty 2

## 2022-10-04 MED ORDER — ROPIVACAINE HCL 2 MG/ML IJ SOLN
INTRAMUSCULAR | Status: AC
  Filled 2022-10-04: qty 20

## 2022-10-04 MED ORDER — DIAZEPAM 5 MG PO TABS
ORAL_TABLET | ORAL | Status: AC
  Filled 2022-10-04: qty 2

## 2022-10-04 NOTE — Telephone Encounter (Signed)
Called the pt and lmom for the pt to call the office due to [place] receiving Disability forms that need to be completed by the provider.  Per the provider the pt needs to make an appointment to complete the forms.

## 2022-10-04 NOTE — Progress Notes (Signed)
PROVIDER NOTE: Interpretation of information contained herein should be left to medically-trained personnel. Specific patient instructions are provided elsewhere under "Patient Instructions" section of medical record. This document was created in part using STT-dictation technology, any transcriptional errors that may result from this process are unintentional.  Patient: Marc Aguilar Type: Established DOB: 03/23/1961 MRN: [id number] PCP: [name], NP  Service: Procedure DOS: 10/04/2022 Setting: Ambulatory Location: Ambulatory outpatient facility Delivery: Face-to-face Provider: Gillis Santa, MD Specialty: Interventional Pain Management Specialty designation: 09 Location: Outpatient facility Ref. Prov.: Gillis Santa, MD   Procedure [facility] Interventional Pain Management )   Type: Cervical Epidural Steroid injection (ESI) (Interlaminar) #2  Laterality: Midline  Level: C7-T1 Imaging: Fluoroscopy-assisted DOS: 10/04/2022  Performed by: Gillis Santa, MD Anesthesia: Local anesthesia (1-2% Lidocaine) Anxiolysis: Oral Valium 10 mg Sedation:    minimal            Purpose: Diagnostic/Therapeutic Indications: Cervicalgia, cervical radicular pain, degenerative disc disease, severe enough to impact quality of life or function.  1. Cervical radiculitis   2. Cervical radicular pain   3. Herniation of cervical intervertebral disc with radiculopathy    NAS-11 score:   Pre-procedure: 5 /10   Post-procedure: 2 /10      Pre-Procedure Preparation  Monitoring: As per clinic protocol. Respiration, ETCO2, SpO2, BP, heart rate and rhythm monitor placed and checked for adequate function  Risk Assessment: Vitals:  IDP:OEUMPNTIR body mass index is 32.5 kg/m as calculated from the following:   Height as of this encounter: '5\' 11"'$  (1.803 m).   Weight as of this encounter: 233 lb (105.7 kg)., Rate:(!) 101 , BP:138/86, Resp:18, Temp:(!) 97.3 F (36.3 C), SpO2:97 %  Allergies: He is allergic to  tape.  Precautions: None required  Blood-thinner(s): None at this time  Coagulopathies: Reviewed. None identified.   Active Infection(s): Reviewed. None identified. Mr. Moure is afebrile   Location setting: Procedure suite Position: Prone, on modified reverse trendelenburg to facilitate breathing, with head in head-cradle. Pillows positioned under chest (below chin-level) with cervical spine flexed. Safety Precautions: Patient was assessed for positional comfort and pressure points before starting the procedure. Prepping solution: DuraPrep (Iodine Povacrylex [0.7% available iodine] and Isopropyl Alcohol, 74% w/w) Prep Area: Entire  cervicothoracic region Approach: percutaneous, paramedial Intended target: Posterior cervical epidural space Materials: Tray: Epidural Needle(s): Epidural (Tuohy) Qty: 1 Length: (32m) 3.5-inch Gauge: 22G   Meds ordered this encounter  Medications   iohexol (OMNIPAQUE) 180 MG/ML injection 10 mL    Must be Myelogram-compatible. If not available, you may substitute with a water-soluble, non-ionic, hypoallergenic, myelogram-compatible radiological contrast medium.   lidocaine (XYLOCAINE) 2 % (with pres) injection 400 mg   diazepam (VALIUM) tablet 10 mg    Make sure Flumazenil is available in the pyxis when using this medication. If oversedation occurs, administer 0.2 mg IV over 15 sec. If after 45 sec no response, administer 0.2 mg again over 1 min; may repeat at 1 min intervals; not to exceed 4 doses (1 mg)   sodium chloride flush (NS) 0.9 % injection 1 mL   ropivacaine (PF) 2 mg/mL (0.2%) (NAROPIN) injection 1 mL   dexamethasone (DECADRON) injection 10 mg   ropivacaine (PF) 2 mg/mL (0.2%) (NAROPIN) injection 4 mL   dexamethasone (DECADRON) injection 10 mg    Orders Placed This Encounter  Procedures   DG PAIN CLINIC C-ARM 1-60 MIN NO REPORT    Intraoperative interpretation by procedural physician at [facility]    Standing Status:   Standing  Number of Occurrences:   1    Order Specific Question:   Reason for exam:    Answer:   Assistance in needle guidance and placement for procedures requiring needle placement in or near specific anatomical locations not easily accessible without such assistance.     Time-out: 1155 I initiated and conducted the "Time-out" before starting the procedure, as per protocol. The patient was asked to participate by confirming the accuracy of the "Time Out" information. Verification of the correct person, site, and procedure were performed and confirmed by me, the nursing staff, and the patient. "Time-out" conducted as per Joint Commission's Universal Protocol (UP.01.01.01). Procedure checklist: Completed   H&P (Pre-op  Assessment)  Mr. Takeshita is a 61 y.o. (year old), male patient, seen today for interventional treatment. 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 Mr. Zehner has a current medication list which includes the following prescription(s): acetaminophen, albuterol, alcohol prep, amlodipine, atorvastatin, bupropion, citalopram, cyclobenzaprine, ezetimibe, lancets, multivitamin with minerals, omeprazole, oxycodone, oxycodone, tadalafil, and trazodone. His primarily concern today is the Neck Pain and Shoulder Pain (Right > left)  He is allergic to tape.   Last encounter: My last encounter with him was on 06/15/2022. Pertinent problems: Mr. Necaise has Migraines; Anxiety and depression; Cervical radiculitis; Herniation of cervical intervertebral disc with radiculopathy; Chronic pain syndrome; and Rotator cuff arthropathy of right shoulder on their pertinent problem list. Pain Assessment: Severity of Chronic pain is  reported as a 5 /10. Location: Neck  /shoulders. Onset: More than a month ago. Quality: Aching, Cramping, Burning. Timing: Intermittent. Modifying factor(s): denies. Vitals:  height is '5\' 11"'$  (1.803 m) and weight is 233 lb (105.7 kg). His temporal temperature is 97.3 F (36.3 C) (abnormal). His blood pressure is 131/99 (abnormal) and his pulse is 97. His respiration is 18 and oxygen saturation is 97%.   Reason for encounter: Interventional pain management therapy due pain of at least four (4) weeks in duration, with to failure to respond to and/or inability to tolerate more conservative care.   Site Confirmation: Mr. Legault was asked to confirm the procedure and laterality before marking the site.  Consent: Before the procedure and under the influence of no sedative(s), amnesic(s), or anxiolytics, the patient was informed of the treatment options, risks and possible complications. To fulfill our ethical and legal obligations, as recommended by the American Medical Association's Code of Ethics, I have informed the patient of my clinical impression; the nature and purpose of the treatment or procedure; the risks, benefits, and possible complications of the intervention; the alternatives, including doing nothing; the risk(s) and benefit(s) of the alternative treatment(s) or procedure(s); and the risk(s) and benefit(s) of doing nothing. The patient was provided information about the general risks and possible complications associated with the procedure. These may include, but are not limited to: failure to achieve desired goals, infection, bleeding, organ or nerve damage, allergic reactions, paralysis, and death. In addition, the patient was informed of those risks and complications associated to Spine-related procedures, such as failure to decrease pain; infection (i.e.: Meningitis, epidural or intraspinal abscess); bleeding (i.e.: epidural hematoma, subarachnoid hemorrhage, or any other type of intraspinal or  peri-dural bleeding); organ or nerve damage (i.e.: Any type of peripheral nerve, nerve root, or spinal cord injury) with subsequent damage to sensory,  motor, and/or autonomic systems, resulting in permanent pain, numbness, and/or weakness of one or several areas of the body; allergic reactions; (i.e.: anaphylactic reaction); and/or death. Furthermore, the patient was informed of those risks and complications associated with the medications. These include, but are not limited to: allergic reactions (i.e.: anaphylactic or anaphylactoid reaction(s)); adrenal axis suppression; blood sugar elevation that in diabetics may result in ketoacidosis or comma; water retention that in patients with history of congestive heart failure may result in shortness of breath, pulmonary edema, and decompensation with resultant heart failure; weight gain; swelling or edema; medication-induced neural toxicity; particulate matter embolism and blood vessel occlusion with resultant organ, and/or nervous system infarction; and/or aseptic necrosis of one or more joints. Finally, the patient was informed that Medicine is not an exact science; therefore, there is also the possibility of unforeseen or unpredictable risks and/or possible complications that may result in a catastrophic outcome. The patient indicated having understood very clearly. We have given the patient no guarantees and we have made no promises. Enough time was given to the patient to ask questions, all of which were answered to the patient's satisfaction. Mr. Gullikson has indicated that he wanted to continue with the procedure. Attestation: I, the ordering provider, attest that I have discussed with the patient the benefits, risks, side-effects, alternatives, likelihood of achieving goals, and potential problems during recovery for the procedure that I have provided informed consent.  Date  Time: 10/04/2022 10:59 AM   Prophylactic antibiotics  Anti-infectives (From  admission, onward)    None      Indication(s): None identified   Description of procedure   Start Time: 1155 hrs  Local Anesthesia: Once the patient was positioned, prepped, and time-out was completed. The target area was identified located. The skin was marked with an approved surgical skin marker. Once marked, the skin (epidermis, dermis, and hypodermis), and deeper tissues (fat, connective tissue and muscle) were infiltrated with a small amount of a short-acting local anesthetic, loaded on a 10cc syringe with a 25G, 1.5-in  Needle. An appropriate amount of time was allowed for local anesthetics to take effect before proceeding to the next step. Local Anesthetic: Lidocaine 1-2% The unused portion of the local anesthetic was discarded in the proper designated containers. Safety Precautions: Aspiration looking for blood return was conducted prior to all injections. At no point did I inject any substances, as a needle was being advanced. Before injecting, the patient was told to immediately notify me if he was experiencing any new onset of "ringing in the ears, or metallic taste in the mouth". No attempts were made at seeking any paresthesias. Safe injection practices and needle disposal techniques used. Medications properly checked for expiration dates. SDV (single dose vial) medications used. After the completion of the procedure, all disposable equipment used was discarded in the proper designated medical waste containers.  Technical description: Protocol guidelines were followed. Using fluoroscopic guidance, the epidural needle was introduced through the skin, ipsilateral to the reported pain, and advanced to the target area. Posterior laminar os was contacted and the needle walked caudad, until the lamina was cleared. The ligamentum flavum was engaged and the epidural space identified using "loss-of-resistance technique" with 2-3 ml of PF-NaCl (0.9% NSS), in a 5cc dedicated LOR syringe. See  "Imaging guidance" below for use of contrast details.  Injection: Once satisfactory needle placement was confirmed, I proceeded to inject the desired solution in slow, incremental fashion, intermittently assessing for discomfort or any signs of abnormal or undesired  spread of substance. Once completed, the needle was removed and disposed of, as per hospital protocols.   3 cc solution made of 1 cc of preservative-free saline, 1 cc of 0.2% ropivacaine, 1 cc of Decadron 10 mg/cc.    Vitals:   10/04/22 1107 10/04/22 1152 10/04/22 1157 10/04/22 1202  BP: 138/86 (!) 129/106 (!) 129/110 (!) 131/99  Pulse: (!) 101 97 96 97  Resp: '18 15 18 18  '$ Temp: (!) 97.3 F (36.3 C)     TempSrc: Temporal     SpO2: 97% 95% 96% 97%  Weight: 233 lb (105.7 kg)     Height: '5\' 11"'$  (1.803 m)       End Time: 1203 hrs  Once the entire procedure was completed, the treated area was cleaned, making sure to leave some of the prepping solution back to take advantage of its long term bactericidal properties.   Imaging guidance  Type of Imaging Technique: Fluoroscopy Guidance (Spinal) Indication(s): Assistance in needle guidance and placement for procedures requiring needle placement in or near specific anatomical locations not easily accessible without such assistance. Exposure Time: Please see nurses notes for exact fluoroscopy time. Contrast: Before injecting any contrast, we confirmed that the patient did not have an allergy to iodine, shellfish, or radiological contrast. Once satisfactory needle placement was completed, radiological contrast was injected under continuous fluoroscopic guidance. Injection of contrast accomplished without complications. See chart for type and volume of contrast used. Fluoroscopic Guidance: I was personally present in the fluoroscopy suite, where the patient was placed in position for the procedure, over the fluoroscopy-compatible table. Fluoroscopy was manipulated, using "Tunnel Vision  Technique", to obtain the best possible view of the target area, on the affected side. Parallax error was corrected before commencing the procedure. A "direction-depth-direction" technique was used to introduce the needle under continuous pulsed fluoroscopic guidance. Once the target was reached, antero-posterior, oblique, and lateral fluoroscopic projection views were taken to confirm needle placement in all planes. Electronic images uploaded into EMR.  Interpretation: Successful epidural injection. Intraoperative imaging interpretation by performing Physician.    Post-op assessment  Post-procedure Vital Signs:  Pulse/HCG Rate: 97  Temp: (!) 97.3 F (36.3 C) Resp: 18 BP: (!) 131/99 SpO2: 97 %  EBL: None  Complications: No immediate post-treatment complications observed by team, or reported by patient.  Note: The patient tolerated the entire procedure well. A repeat set of vitals were taken after the procedure and the patient was kept under observation following institutional policy, for this type of procedure. Post-procedural neurological assessment was performed, showing return to baseline, prior to discharge. The patient was provided with post-procedure discharge instructions, including a section on how to identify potential problems. Should any problems arise concerning this procedure, the patient was given instructions to immediately contact us, at any time, without hesitation. In any case, we plan to contact the patient by telephone for a follow-up status report regarding this interventional procedure.  Comments:  No additional relevant information.   Plan of care    Medications administered: We administered iohexol, lidocaine, diazepam, sodium chloride flush, ropivacaine (PF) 2 mg/mL (0.2%), dexamethasone, ropivacaine (PF) 2 mg/mL (0.2%), and dexamethasone.  Follow-up plan:   Return in about 6 weeks (around 11/15/2022) for Post Procedure Evaluation, in person.      C7-T1 ESI  06/30/22, 10/04/22   Recent Visits Date Type Provider Dept  08/23/22 Office Visit Gillis Santa, MD [facility]  07/14/22 Office Visit Gillis Santa, MD [facility]  Showing recent visits  within past 90 days and meeting all other requirements Today's Visits Date Type Provider Dept  10/04/22 Procedure visit Gillis Santa, MD [facility]  Showing today's visits and meeting all other requirements Future Appointments Date Type Provider Dept  11/15/22 Appointment Gillis Santa, MD [facility]  Showing future appointments within next 90 days and meeting all other requirements   Disposition: Discharge home  Discharge (Date  Time): 10/04/2022; 1206 hrs.   Primary Care Physician: [name], NP Location: [facility] Note by: Gillis Santa, MD Date: 10/04/2022; Time: 1:26 PM  DISCLAIMER: Medicine is not an exact science. It has no guarantees or warranties. The decision to proceed with this intervention was based on the information collected from the patient. Conclusions were drawn from the patient's questionnaire, interview, and examination. Because information was provided in large part by the patient, it cannot be guaranteed that it has not been purposely or unconsciously manipulated or altered. Every effort has been made to obtain as much accurate, relevant, available data as possible. Always take into account that the treatment will also be dependent on availability of resources and existing treatment guidelines, considered by other Pain Management Specialists as being common knowledge and practice, at the time of the intervention. It is also important to point out that variation in procedural techniques and pharmacological choices are the acceptable norm. For Medico-Legal review purposes, the indications, contraindications, technique, and results of the these procedures should only be evaluated, judged and interpreted by a  Board-Certified Interventional Pain Specialist with extensive familiarity and expertise in the same exact procedure and technique.

## 2022-10-04 NOTE — Progress Notes (Signed)
PROVIDER NOTE: Interpretation of information contained herein should be left to medically-trained personnel. Specific patient instructions are provided elsewhere under "Patient Instructions" section of medical record. This document was created in part using STT-dictation technology, any transcriptional errors that may result from this process are unintentional.  Patient: Marc Aguilar Type: Established DOB: 02-03-1961 MRN: [id number] PCP: [name], NP  Service: Procedure DOS: 10/04/2022 Setting: Ambulatory Location: Ambulatory outpatient facility Delivery: Face-to-face Provider: Gillis Santa, MD Specialty: Interventional Pain Management Specialty designation: 09 Location: Outpatient facility Ref. Prov.: Gillis Santa, MD    Primary Reason for Visit: Interventional Pain Management Treatment. CC: Neck Pain and Shoulder Pain (Right > left)  Procedure:           Type: Suprascapular nerve block (SSNB) #1  Laterality:  Right Level: Superior to scapular spine, lateral to supraspinatus fossa (Suprascapular notch).  Imaging: Fluoroscopic guidance         Anesthesia: Local anesthesia (1-2% Lidocaine) Anxiolysis: Valium 10 mg PO DOS: 10/04/2022  Performed by: Gillis Santa, MD  Purpose: Diagnostic/Therapeutic Indications: Shoulder pain, severe enough to impact quality of life and/or function.  Right shoulder arthropathy  NAS-11 score:   Pre-procedure: 5 /10   Post-procedure: 2 /10     Target: Suprascapular nerve Location: midway between the medial border of the scapula and the acromion as it runs through the suprascapular notch. Region: Suprascapular, posterior shoulder  Approach: Percutaneous  Neuroanatomy: The suprascapular nerve is the lateral branch of the superior trunk of the brachial plexus. It receives nerve fibers that originate in the nerve roots C5 and C6 (and sometimes C4). It is a mixed nerve, meaning that it provides both sensory and motor supply for the suprascapular  region. Function: The main function of this nerve is to provide motor innervation for two muscles, the supraspinatus and infraspinatus muscles. They are part of the rotator cuff muscles. In addition, the suprascapular nerve provides a sensory supply to the joints of the scapula (glenohumeral and acromioclavicular joints). Rationale (medical necessity): procedure needed and proper for the diagnosis and/or treatment of the patient's medical symptoms and needs.  Position / Prep / Materials:  Position: Prone Materials:  Tray: Block Needle(s):  Type: Spinal  Gauge (G): 22  Length: 3.5 in.  Qty: 1 Prep solution: DuraPrep (Iodine Povacrylex [0.7% available iodine] and Isopropyl Alcohol, 74% w/w) Prep Area: Entire posterior shoulder area. From upper spine to shoulder proper (upper arm), and from lateral neck to lower tip of shoulder blade.   Pre-op H&P Assessment:  Marc Aguilar is a 61 y.o. (year old), male patient, seen today for interventional treatment. 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 Marc Aguilar has a current medication list which includes the following prescription(s): acetaminophen, albuterol, alcohol prep, amlodipine, atorvastatin, bupropion, citalopram, cyclobenzaprine, ezetimibe, lancets, multivitamin with minerals, omeprazole, oxycodone, oxycodone, tadalafil, and trazodone. His primarily concern today is the Neck Pain and Shoulder Pain (Right > left)  Initial Vital Signs:  Pulse/HCG Rate: (!) 101  Temp: (!) 97.3 F (36.3 C) Resp: 18 BP: 138/86 SpO2: 97 %  BMI: Estimated body mass index is 32.5 kg/m as calculated from the following:   Height as of this encounter: '5\' 11"'$  (1.803 m).    Weight as of this encounter: 233 lb (105.7 kg).  Risk Assessment: Allergies:  Reviewed. He is allergic to tape.  Allergy Precautions: None required Coagulopathies: Reviewed. None identified.  Blood-thinner therapy: None at this time Active Infection(s): Reviewed. None identified. Mr. Sommers is afebrile  Site Confirmation: Marc Aguilar was asked to confirm the procedure and laterality before marking the site Procedure checklist: Completed Consent: Before the procedure and under the influence of no sedative(s), amnesic(s), or anxiolytics, the patient was informed of the treatment options, risks and possible complications. To fulfill our ethical and legal obligations, as recommended by the American Medical Association's Code of Ethics, I have informed the patient of my clinical impression; the nature and purpose of the treatment or procedure; the risks, benefits, and possible complications of the intervention; the alternatives, including doing nothing; the risk(s) and benefit(s) of the alternative treatment(s) or procedure(s); and the risk(s) and benefit(s) of doing nothing. The patient was provided information about the general risks and possible complications associated with the procedure. These may include, but are not limited to: failure to achieve desired goals, infection, bleeding, organ or nerve damage, allergic reactions, paralysis, and death. In addition, the patient was informed of those risks and complications associated to the procedure, such as failure to decrease pain; infection; bleeding; organ or nerve damage with subsequent damage to sensory, motor, and/or autonomic systems, resulting in permanent pain, numbness, and/or weakness of one or several areas of the body; allergic reactions; (i.e.: anaphylactic reaction); and/or death. Furthermore, the patient was informed of those risks and complications associated with the medications. These include, but are not limited to: allergic reactions (i.e.:  anaphylactic or anaphylactoid reaction(s)); adrenal axis suppression; blood sugar elevation that in diabetics may result in ketoacidosis or comma; water retention that in patients with history of congestive heart failure may result in shortness of breath, pulmonary edema, and decompensation with resultant heart failure; weight gain; swelling or edema; medication-induced neural toxicity; particulate matter embolism and blood vessel occlusion with resultant organ, and/or nervous system infarction; and/or aseptic necrosis of one or more joints. Finally, the patient was informed that Medicine is not an exact science; therefore, there is also the possibility of unforeseen or unpredictable risks and/or possible complications that may result in a catastrophic outcome. The patient indicated having understood very clearly. We have given the patient no guarantees and we have made no promises. Enough time was given to the patient to ask questions, all of which were answered to the patient's satisfaction. Mr. Penning has indicated that he wanted to continue with the procedure. Attestation: I, the ordering provider, attest that I have discussed with the patient the benefits, risks, side-effects, alternatives, likelihood of achieving goals, and potential problems during recovery for the procedure that I have provided informed consent. Date  Time: 10/04/2022 10:59 AM  Pre-Procedure Preparation:  Monitoring: As per clinic protocol. Respiration, ETCO2, SpO2, BP, heart rate and rhythm monitor placed and checked for adequate function Safety Precautions: Patient was assessed for positional comfort and pressure points before starting the procedure. Time-out: I initiated and conducted the "Time-out" before starting the procedure, as per protocol. The patient was asked to participate by confirming the accuracy of the "Time Out" information. Verification of the correct person, site, and procedure were performed and confirmed by me,  the nursing staff, and the patient. "Time-out" conducted as per Joint Commission's Universal Protocol (UP.01.01.01). Time: 1155  Description of Procedure:          Procedural Technique Safety Precautions: Aspiration looking for blood return was conducted prior to all injections. At no point did we  inject any substances, as a needle was being advanced. No attempts were made at seeking any paresthesias. Safe injection practices and needle disposal techniques used. Medications properly checked for expiration dates. SDV (single dose vial) medications used. Description of the Procedure: Protocol guidelines were followed. The patient was placed in position over the procedure table. The target area was identified and the area prepped in the usual manner. Skin & deeper tissues infiltrated with local anesthetic. Appropriate amount of time allowed to pass for local anesthetics to take effect. The procedure needles were then advanced to the target area. Proper needle placement secured. Negative aspiration confirmed. Solution injected in intermittent fashion, asking for systemic symptoms every 0.5cc of injectate. The needles were then removed and the area cleansed, making sure to leave some of the prepping solution back to take advantage of its long term bactericidal properties.  5 cc solution made of 4 cc of 0.2% ropivacaine, 1 cc of Decadron 10 mg/cc.  Injected along the right suprascapular nerve after contrast confirmation    Vitals:   10/04/22 1107 10/04/22 1152 10/04/22 1157 10/04/22 1202  BP: 138/86 (!) 129/106 (!) 129/110 (!) 131/99  Pulse: (!) 101 97 96 97  Resp: '18 15 18 18  '$ Temp: (!) 97.3 F (36.3 C)     TempSrc: Temporal     SpO2: 97% 95% 96% 97%  Weight: 233 lb (105.7 kg)     Height: '5\' 11"'$  (1.803 m)        Start Time: 1155 hrs. End Time: 1203 hrs.  Imaging Guidance (Spinal):          Type of Imaging Technique: Fluoroscopy Guidance (Spinal) Indication(s): Assistance in needle guidance  and placement for procedures requiring needle placement in or near specific anatomical locations not easily accessible without such assistance. Exposure Time: Please see nurses notes. Contrast: Before injecting any contrast, we confirmed that the patient did not have an allergy to iodine, shellfish, or radiological contrast. Once satisfactory needle placement was completed at the desired level, radiological contrast was injected. Contrast injected under live fluoroscopy. No contrast complications. See chart for type and volume of contrast used. Fluoroscopic Guidance: I was personally present during the use of fluoroscopy. "Tunnel Vision Technique" used to obtain the best possible view of the target area. Parallax error corrected before commencing the procedure. "Direction-depth-direction" technique used to introduce the needle under continuous pulsed fluoroscopy. Once target was reached, antero-posterior, oblique, and lateral fluoroscopic projection used confirm needle placement in all planes. Images permanently stored in EMR. Interpretation: I personally interpreted the imaging intraoperatively. Adequate needle placement confirmed in multiple planes. Appropriate spread of contrast into desired area was observed. No evidence of afferent or efferent intravascular uptake. No intrathecal or subarachnoid spread observed. Permanent images saved into the patient's record.  Antibiotic Prophylaxis:   Anti-infectives (From admission, onward)    None      Indication(s): None identified  Post-operative Assessment:  Post-procedure Vital Signs:  Pulse/HCG Rate: 97  Temp: (!) 97.3 F (36.3 C) Resp: 18 BP: (!) 131/99 SpO2: 97 %  EBL: None  Complications: No immediate post-treatment complications observed by team, or reported by patient.  Note: The patient tolerated the entire procedure well. A repeat set of vitals were taken after the procedure and the patient was kept under observation following  institutional policy, for this type of procedure. Post-procedural neurological assessment was performed, showing return to baseline, prior to discharge. The patient was provided with post-procedure discharge instructions, including a section on how to identify potential  problems. Should any problems arise concerning this procedure, the patient was given instructions to immediately contact us, at any time, without hesitation. In any case, we plan to contact the patient by telephone for a follow-up status report regarding this interventional procedure.  Comments:  No additional relevant information.  Plan of Care  Orders:  Orders Placed This Encounter  Procedures   DG PAIN [facility] C-ARM 1-60 MIN NO REPORT    Intraoperative interpretation by procedural physician at [facility].    Standing Status:   Standing    Number of Occurrences:   1    Order Specific Question:   Reason for exam:    Answer:   Assistance in needle guidance and placement for procedures requiring needle placement in or near specific anatomical locations not easily accessible without such assistance.    Medications ordered for procedure: Meds ordered this encounter  Medications   iohexol (OMNIPAQUE) 180 MG/ML injection 10 mL    Must be Myelogram-compatible. If not available, you may substitute with a water-soluble, non-ionic, hypoallergenic, myelogram-compatible radiological contrast medium.   lidocaine (XYLOCAINE) 2 % (with pres) injection 400 mg   diazepam (VALIUM) tablet 10 mg    Make sure Flumazenil is available in the pyxis when using this medication. If oversedation occurs, administer 0.2 mg IV over 15 sec. If after 45 sec no response, administer 0.2 mg again over 1 min; may repeat at 1 min intervals; not to exceed 4 doses (1 mg)   sodium chloride flush (NS) 0.9 % injection 1 mL   ropivacaine (PF) 2 mg/mL (0.2%) (NAROPIN) injection 1 mL   dexamethasone (DECADRON) injection 10 mg   ropivacaine (PF) 2 mg/mL  (0.2%) (NAROPIN) injection 4 mL   dexamethasone (DECADRON) injection 10 mg   Medications administered: We administered iohexol, lidocaine, diazepam, sodium chloride flush, ropivacaine (PF) 2 mg/mL (0.2%), dexamethasone, ropivacaine (PF) 2 mg/mL (0.2%), and dexamethasone.  See the medical record for exact dosing, route, and time of administration.  Follow-up plan:   Return in about 6 weeks (around 11/15/2022) for Post Procedure Evaluation, in person.     Recent Visits Date Type Provider Dept  08/23/22 Office Visit Gillis Santa, MD [facility]  07/14/22 Office Visit Gillis Santa, MD [facility]  Showing recent visits within past 90 days and meeting all other requirements Today's Visits Date Type Provider Dept  10/04/22 Procedure visit Gillis Santa, MD [facility]  Showing today's visits and meeting all other requirements Future Appointments Date Type Provider Dept  11/15/22 Appointment Gillis Santa, MD [facility]  Showing future appointments within next 90 days and meeting all other requirements  Disposition: Discharge home  Discharge (Date  Time): 10/04/2022; 1206 hrs.   Primary Care Physician: [name], NP Location: [facility] Note by: Gillis Santa, MD Date: 10/04/2022; Time: 1:26 PM  Disclaimer:  Medicine is not an exact science. The only guarantee in medicine is that nothing is guaranteed. It is important to note that the decision to proceed with this intervention was based on the information collected from the patient. The Data and conclusions were drawn from the patient's questionnaire, the interview, and the physical examination. Because the information was provided in large part by the patient, it cannot be guaranteed that it has not been purposely or unconsciously manipulated. Every effort has been made to obtain as much relevant data as possible for this evaluation. It is important to note that  the conclusions that lead to this  procedure are derived in large part from the available data. Always take into account that the treatment will also be dependent on availability of resources and existing treatment guidelines, considered by other Pain Management Practitioners as being common knowledge and practice, at the time of the intervention. For Medico-Legal purposes, it is also important to point out that variation in procedural techniques and pharmacological choices are the acceptable norm. The indications, contraindications, technique, and results of the above procedure should only be interpreted and judged by a Board-Certified Interventional Pain Specialist with extensive familiarity and expertise in the same exact procedure and technique.

## 2022-10-04 NOTE — Patient Instructions (Addendum)
Pain Management Discharge Instructions  General Discharge Instructions :  If you need to reach your doctor call: Monday-Friday 8:00 am - 4:00 pm at [phone] on the type of procedure that was done, some parts of your body may feel week and/or numb.  This usually clears up by tonight or the next day.  Walk with the use of an assistive device or accompanied by an adult for the 24 hours.  You may use ice on the affected area for the first 24 hours.  Put ice in a Ziploc bag and cover with a towel and place against area 15 minutes on 15 minutes off.  You may switch to heat after 24 hours.Trigger Point Injection Trigger points are areas where you have pain. A trigger point injection is a shot given in the trigger point to help relieve pain for a few days to a few months. Common places for trigger points include the neck, shoulders, upper back, or lower back. A trigger point injection will not cure long-term (chronic) pain permanently. These injections do not always work for every person. For some people, they can help to relieve pain for a few days to a few months. Tell a health care provider about: Any allergies you have. All medicines you are taking, including vitamins, herbs, eye drops, creams, and over-the-counter medicines. Any problems you or family members have had with anesthetic medicines. Any bleeding problems you have. Any surgeries you have had. Any medical conditions you have. Whether you are pregnant or may be pregnant. What are the risks? Generally, this is a safe procedure. However, problems may occur, including: Infection. Bleeding or bruising. Allergic reaction to the injected medicine. Irritation of the skin around the injection site. What happens before the procedure? Ask your health care provider about: Changing or stopping your regular medicines. This is especially important if you are taking diabetes medicines or blood thinners. Taking medicines such as  aspirin and ibuprofen. These medicines can thin your blood. Do not take these medicines unless your health care provider tells you to take them. Taking over-the-counter medicines, vitamins, herbs, and supplements. What happens during the procedure?  Your health care provider will feel for trigger points. A marker may be used to circle the area for the injection. The skin over the trigger point will be washed with a germ-killing soap. You may be given a medicine to help you relax (sedative). A thin needle is used for the injection. You may feel pain or a twitching feeling when the needle enters your skin. A numbing solution may be injected into the trigger point. Sometimes a medicine to keep down inflammation is also injected. Your health care provider may move the needle around the area where the trigger point is located until the tightness and twitching goes away. After the injection, your health care provider may put gentle pressure over the injection site. The injection site will be covered with a bandage (dressing). The procedure may vary among health care providers and hospitals. What can I expect after treatment? After treatment, you may have soreness and stiffness for 1-2 days. Follow these instructions at home: Injection site care Remove your dressing in a few hours, or as told by your health care provider. Check your injection site every day for signs of infection. Check for: Redness, swelling, or pain. Fluid or blood. Warmth. Pus or a bad smell. Managing pain, stiffness, and swelling If directed, put ice on the affected area. To do this: Put ice in a plastic bag. Place  a towel between your skin and the bag. Leave the ice on for 20 minutes, 2-3 times a day. Remove the ice if your skin turns bright red. This is very important. If you cannot feel pain, heat, or cold, you have a greater risk of damage to the area. Activity If you were given a sedative during the procedure, it can  affect you for several hours. Do not drive or operate machinery until your health care provider says that it is safe. Do not take baths, swim, or use a hot tub until your health care provider approves. Return to your normal activities as told by your health care provider. Ask your health care provider what activities are safe for you. General instructions If you were asked to stop your regular medicines, ask your health care provider when you may start taking them again. You may be asked to see an occupational or physical therapist for exercises that reduce muscle strain and stretch the area of the trigger point. Keep all follow-up visits. This is important. Contact a health care provider if: Your pain comes back, and it is worse than before the injection. You may need more injections. You have chills or a fever. The injection site becomes more painful, red, swollen, or warm to the touch. Summary A trigger point injection is a shot given in the trigger point to help relieve pain. Common places for trigger point injections are the neck, shoulders, upper back, and lower back. These injections do not always work for every person, but for some people, the injections can help to relieve pain for a few days to a few months. Contact a health care provider if symptoms come back or if they are worse than before treatment. Also, get help if the injection site becomes more painful, red, swollen, or warm to the touch. This information is not intended to replace advice given to you by your health care provider. Make sure you discuss any questions you have with your health care provider. Document Revised: 01/13/2021 Document Reviewed: 01/13/2021 Elsevier Patient Education  [place]. Epidural Steroid Injection Patient Information reased pain.  Epidural steroids may be injected anywhere along the spine and from the neck to the low back depending upon the location of your pain.   After numbing the  skin with local a Description: The epidural space surrounds the nerves as they exit the spinal cord.  In some patients, the nerves can be compressed and inflamed by a bulging disc or a tight spinal canal (spinal stenosis).  By injecting steroids into the epidural space, we can bring irritated nerves into direct contact with a potentially helpful medication.  These steroids act directly on the irritated nerves and can reduce swelling and inflammation which often leads to decnesthetic (like Novocaine), a small needle is passed into the epidural space slowly.  You may experience a sensation of pressure while this is being done.  The entire block usually last less than 10 minutes.  Conditions which may be treated by epidural steroids:  Low back and leg pain Neck and arm pain Spinal stenosis Post-laminectomy syndrome Herpes zoster (shingles) pain Pain from compression fractures  Preparation for the injection:  Do not eat any solid food or dairy products within 8 hours of your appointment.  You may drink clear liquids up to 3 hours before appointment.  Clear liquids include water, black coffee, juice or soda.  No milk or cream please. You may take your regular medication, including pain medications, with a  sip of water before your appointment  Diabetics should hold regular insulin (if taken separately) and take 1/2 normal NPH dos the morning of the procedure.  Carry some sugar containing items with you to your appointment. A driver must accompany you and be prepared to drive you home after your procedure.  Bring all your current medications with your. An IV may be inserted and sedation may be given at the discretion of the physician.   A blood pressure cuff, EKG and other monitors will often be applied during the procedure.  Some patients may need to have extra oxygen administered for a short period. You will be asked to provide medical information, including your allergies, prior to the procedure.   We must know immediately if you are taking blood thinners (like Coumadin/Warfarin)  Or if you are allergic to IV iodine contrast (dye). We must know if you could possible be pregnant.  Possible side-effects: Bleeding from needle site Infection (rare, may require surgery) Nerve injury (rare) Numbness & tingling (temporary) Difficulty urinating (rare, temporary) Spinal headache ( a headache worse with upright posture) Light -headedness (temporary) Pain at injection site (several days) Decreased blood pressure (temporary) Weakness in arm/leg (temporary) Pressure sensation in back/neck (temporary)  Call if you experience: Fever/chills associated with headache or increased back/neck pain. Headache worsened by an upright position. New onset weakness or numbness of an extremity below the injection site Hives or difficulty breathing (go to the emergency room) Inflammation or drainage at the infection site Severe back/neck pain Any new symptoms which are concerning to you  Please note:  Although the local anesthetic injected can often make your back or neck feel good for several hours after the injection, the pain will likely return.  It takes 3-7 days for steroids to work in the epidural space.  You may not notice any pain relief for at least that one week.  If effective, we will often do a series of three injections spaced 3-6 weeks apart to maximally decrease your pain.  After the initial series, we generally will wait several months before considering a repeat injection of the same type.  If you have any questions, please call [phone] [facility]

## 2022-10-05 ENCOUNTER — Encounter: Payer: Self-pay | Admitting: Internal Medicine

## 2022-10-05 ENCOUNTER — Ambulatory Visit (INDEPENDENT_AMBULATORY_CARE_PROVIDER_SITE_OTHER): Payer: No Typology Code available for payment source | Admitting: Internal Medicine

## 2022-10-05 ENCOUNTER — Other Ambulatory Visit: Payer: Self-pay

## 2022-10-05 ENCOUNTER — Telehealth: Payer: Self-pay

## 2022-10-05 ENCOUNTER — Ambulatory Visit: Payer: Self-pay | Admitting: *Deleted

## 2022-10-05 VITALS — BP 126/74 | HR 99 | Temp 97.1°F | Wt 236.0 lb

## 2022-10-05 DIAGNOSIS — Z0289 Encounter for other administrative examinations: Secondary | ICD-10-CM

## 2022-10-05 DIAGNOSIS — E1165 Type 2 diabetes mellitus with hyperglycemia: Secondary | ICD-10-CM | POA: Diagnosis not present

## 2022-10-05 DIAGNOSIS — M5412 Radiculopathy, cervical region: Secondary | ICD-10-CM | POA: Diagnosis not present

## 2022-10-05 LAB — POCT GLYCOSYLATED HEMOGLOBIN (HGB A1C): Hemoglobin A1C: 7.2 % — AB (ref 4.0–5.6)

## 2022-10-05 MED ORDER — TRAZODONE HCL 50 MG PO TABS
50.0000 mg | ORAL_TABLET | Freq: Every evening | ORAL | 0 refills | Status: DC | PRN
  Filled 2022-10-05: qty 90, 90d supply, fill #0

## 2022-10-05 MED ORDER — GLIPIZIDE ER 5 MG PO TB24
5.0000 mg | ORAL_TABLET | Freq: Every day | ORAL | 0 refills | Status: DC
  Filled 2022-10-05: qty 90, 90d supply, fill #0

## 2022-10-05 NOTE — Telephone Encounter (Signed)
Post procedure follow up.  LM 

## 2022-10-05 NOTE — Telephone Encounter (Signed)
Called patient back to assess rechecked blood glucose. Patient reports he drank water and ate a sandwich. Reports prior to 1st encounter with NT he took 5 units novolog insulin that was his wife's approx. 1 hour ago that was not mentioned previously. Blood glucose rechecked for 382 now. Patient denies sx of hyperglycemia. Appt scheduled with PCP today at 2:40 pm. Please advise for additional care prior to OV. Today . Recommended if any sx of hyperglycemia noted call back or go to ED.  Reason for Disposition . [1] Blood glucose > 300 mg/dL (16.7 mmol/L) AND [2] two or more times in a row  Protocols used: Diabetes - High Blood Sugar-A-AH

## 2022-10-05 NOTE — Progress Notes (Unsigned)
Subjective:    Patient ID: Marc Aguilar, male    DOB: 1960-12-21, 60 y.o.   MRN: [id number]  HPI  Pt presents to the clinic today with c/o elevated glucose of 443. He reports his fasting glucose this morning was 150. He is feeling slightly lightheaded but he denies blurred vision, chest pain, nausea, vomiting or weakness. He did take 5 units of Novolog that belongs to his wife but it did not come down much. He has a history of DM 2, last A1C was 5.8%, 03/2022. He received a shoulder injection yesterday by Dr. Holley Raring that was comprised of Decadron.  Of note, he is requesting disability form completion.  He reports he has been out of work since August 4.  He is unsure when he will return at this time.  He is struggling with cervical radiculitis with numbness, tingling and weakness of his upper extremities.  MRI cervical spine from 02/2022 showed:   IMPRESSION: Cervical spondylosis, as outlined. No more than mild spinal canal stenosis. Multilevel foraminal stenosis, as detailed and greatest on the right at C4-C5 (moderate) and bilaterally at C5-C6 (moderate right, moderate/severe left). Disc degeneration is greatest at C4-C5 (mild-to-moderate) and C5-C6 (moderate). Nonspecific straightening of the expected cervical lordosis.  He did have a complete tear of his rotator cuff and underwent surgery 06/2022.  He has been undergoing PT and following with Dr. Holley Raring for neck injections.  He reports these neck injections are not helping.  Review of Systems  Past Medical History:  Diagnosis Date   Anxiety    Arthritis    "knees and hips" (02/28/2018)   Cancer (HCC)    prostate    Chicken pox    Depression    GERD (gastroesophageal reflux disease)    Heart murmur    Hernia, abdominal    High cholesterol    History of gout    Hypertension    Migraine    "probably monthly" (02/28/2018)   Neck pain    OSA on CPAP    Pneumonia    "once" (02/28/2018)   Refusal of blood transfusions as patient is  [employer]    Seasonal allergies    Type II diabetes mellitus (HCC)     Current Outpatient Medications  Medication Sig Dispense Refill   acetaminophen (TYLENOL) 500 MG tablet Take 2 tablets (1,000 mg total) by mouth every 8 (eight) hours. 90 tablet 2   albuterol (VENTOLIN HFA) 108 (90 Base) MCG/ACT inhaler Inhale 2 puffs into the lungs every 6 (six) hours as needed for wheezing or shortness of breath. 1 Inhaler 0   Alcohol Swabs (ALCOHOL PREP) PADS 1 each by Does not apply route 2 (two) times daily. 200 each 3   amLODipine (NORVASC) 10 MG tablet Take 1 tablet (10 mg total) by mouth daily. 90 tablet 1   atorvastatin (LIPITOR) 20 MG tablet Take 1 tablet (20 mg total) by mouth daily. (Patient taking differently: Take 20 mg by mouth at bedtime.) 90 tablet 0   buPROPion (WELLBUTRIN XL) 300 MG 24 hr tablet Take 1 tablet (300 mg total) by mouth daily. 90 tablet 0   citalopram (CELEXA) 40 MG tablet TAKE 1 TABLET (40 MG TOTAL) BY MOUTH DAILY. 90 tablet 0   cyclobenzaprine (FLEXERIL) 10 MG tablet Take 1 tablet (10 mg total) by mouth daily as needed for muscle spasms. 90 tablet 0   ezetimibe (ZETIA) 10 MG tablet Take 1 tablet (10 mg total) by mouth daily. 90 tablet 0  Lancets MISC 1 each by Does not apply route in the morning and at bedtime. 200 each 2   Multiple Vitamins-Minerals (MULTIVITAMIN WITH MINERALS) tablet Take 1 tablet by mouth daily.     omeprazole (PRILOSEC) 20 MG capsule Take 1 capsule (20 mg total) by mouth daily. 90 capsule 0   oxyCODONE (OXY IR/ROXICODONE) 5 MG immediate release tablet Take 1-2 tablets (5-10 mg total) by mouth every 4 (four) hours as needed for pain. 30 tablet 0   oxyCODONE (ROXICODONE) 5 MG immediate release tablet Take 1-2 tablets (5-10 mg total) by mouth every 4 (four) hours as needed (pain). 30 tablet 0   tadalafil (CIALIS) 20 MG tablet Take 1 tablet (20 mg total) by mouth daily as needed for erectile dysfunction. 10 tablet 6   traZODone (DESYREL) 50 MG tablet  Take 1 tablet (50 mg total) by mouth at bedtime as needed for sleep. 90 tablet 0   No current facility-administered medications for this visit.    Allergies  Allergen Reactions   Tape Rash    Paper tape-blisters Paper tape-blisters    Family History  Problem Relation Age of Onset   Diabetes Mother    Hyperlipidemia Mother    Bladder Cancer Father    Liver cancer Sister    Diabetes Sister    Diabetes Maternal Aunt    Arthritis Maternal Grandmother    Diabetes Maternal Grandmother    Arthritis Maternal Grandfather    Arthritis Paternal Grandmother    Diabetes Brother    Heart disease Brother    Colon cancer Neg Hx     Social History   Socioeconomic History   Marital status: Married    Spouse name: Not on file   Number of children: 2   Years of education: Not on file   Highest education level: Not on file  Occupational History   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  Tobacco Use   Smoking status: Former    Packs/day: 1.00    Years: 27.00    Total pack years: 27.00    Types: Cigarettes    Quit date: 10/18/2009    Years since quitting: 12.9   Smokeless tobacco: Former    Types: Chew    Quit date: 10/18/1994  Vaping Use   Vaping Use: Never used  Substance and Sexual Activity   Alcohol use: Yes    Alcohol/week: 12.0 standard drinks of alcohol    Types: 12 Cans of beer per week   Drug use: Never   Sexual activity: Not on file  Other Topics Concern   Not on file  Social History Narrative   Not on file   Social Determinants of Health   Financial Resource Strain: Not on file  Food Insecurity: Not on file  Transportation Needs: Not on file  Physical Activity: Not on file  Stress: Not on file  Social Connections: Not on file  Intimate Partner Violence: Not on file     Constitutional: Patient reports headaches.  Denies fever, malaise, fatigue, or abrupt weight changes.  HEENT: Denies eye pain, eye redness, ear pain, ringing in the ears, wax buildup, runny nose, nasal  congestion, bloody nose, or sore throat. Respiratory: Denies difficulty breathing, shortness of breath, cough or sputum production.   Cardiovascular: Denies chest pain, chest tightness, palpitations or swelling in the hands or feet.  Gastrointestinal: Denies abdominal pain, bloating, constipation, diarrhea or blood in the stool.  GU: Denies urgency, frequency, pain with urination, burning sensation, blood in urine, odor or discharge. Musculoskeletal:  Patient reports neck and right shoulder pain.  Denies decrease in range of motion, difficulty with gait, muscle pain or joint swelling.  Skin: Denies redness, rashes, lesions or ulcercations.  Neurological: Pt reports lightheadedness, numbness, tingling and weakness of his upper extremities. Denies dizziness, difficulty with memory, difficulty with speech or problems with balance and coordination.  Psych: Denies anxiety, depression, SI/HI.  No other specific complaints in a complete review of systems (except as listed in HPI above).     Objective:   Physical Exam BP 126/74 (BP Location: Left Arm, Patient Position: Sitting, Cuff Size: Large)   Pulse 99   Temp (!) 97.1 F (36.2 C) (Temporal)   Wt 236 lb (107 kg)   SpO2 99%   BMI 32.92 kg/m   Wt Readings from Last 3 Encounters:  10/04/22 233 lb (105.7 kg)  08/24/22 238 lb 12.8 oz (108.3 kg)  08/23/22 230 lb (104.3 kg)    General: Appears his stated age, obese, in NAD. Skin: Warm, dry and intact. No ulcerations noted. HEENT: Head: normal shape and size; Eyes: sclera white, no icterus, conjunctiva pink, PERRLA and EOMs intact;  Cardiovascular: Normal rate and rhythm. S1,S2 noted.  No murmur, rubs or gallops noted.  Pulmonary/Chest: Normal effort and positive vesicular breath sounds. No respiratory distress. No wheezes, rales or ronchi noted.  Musculoskeletal: Normal internal and external rotation of the right shoulder but with pain.,  Decreased flexion of the cervical spine secondary to  pain.  Normal extension, rotation and lateral bending of the cervical spine.  Pain with palpation over the cervical spine.  Shoulder shrug equal.  Strength 5/5 BUE.  Handgrips equal. Neurological: Alert and oriented.  Coordination normal.   BMET    Component Value Date/Time   NA 138 03/18/2022 1341   K 4.1 03/18/2022 1341   CL 105 03/18/2022 1341   CO2 22 03/18/2022 1341   GLUCOSE 153 (H) 03/18/2022 1341   BUN 17 03/18/2022 1341   CREATININE 0.92 03/18/2022 1341   CALCIUM 9.4 03/18/2022 1341   GFRNONAA >60 09/26/2020 1951   GFRAA >60 11/20/2019 0445    Lipid Panel     Component Value Date/Time   CHOL 195 03/18/2022 1341   CHOL 157 10/26/2019 1203   TRIG 543 (H) 03/18/2022 1341   HDL 50 03/18/2022 1341   HDL 48 10/26/2019 1203   CHOLHDL 3.9 03/18/2022 1341   VLDL 60.2 (H) 01/01/2021 1047   [place]  03/18/2022 1341     Comment:     . LDL cholesterol not calculated. Triglyceride levels greater than 400 mg/dL invalidate calculated LDL results. . Reference range: <100 . Desirable range <100 mg/dL for primary prevention;   <70 mg/dL for patients with CHD or diabetic patients  with > or = 2 CHD risk factors. Marland Kitchen LDL-C is now calculated using the Martin-Hopkins  calculation, which is a validated novel method providing  better accuracy than the Friedewald equation in the  estimation of LDL-C.  Cresenciano Genre et al. Annamaria Helling. 0488;891(69): 2061-2068  (http://education.[facility].com/faq/FAQ164)     CBC    Component Value Date/Time   WBC 4.2 03/18/2022 1341   RBC 5.17 03/18/2022 1341   HGB 14.0 03/18/2022 1341   HCT 43.6 03/18/2022 1341   PLT 256 03/18/2022 1341   MCV 84.3 03/18/2022 1341   MCH 27.1 03/18/2022 1341   MCHC 32.1 03/18/2022 1341   RDW 15.0 03/18/2022 1341   LYMPHSABS 3.0 09/26/2020 1951   MONOABS 0.6 09/26/2020 1951   EOSABS 0.2 09/26/2020 1951  BASOSABS 0.1 09/26/2020 1951    Hgb A1C Lab Results  Component Value Date   HGBA1C 5.8 (A) 03/18/2022             Assessment & Plan:   Hyperglycemia, DM2:  POCT A1c 7.2% We will start Glipizide 5 mg XL daily Reinforced low-carb diet and exercise for weight loss Would avoid further injections at this time as his A1c elevation is likely secondary to steroids  Cervical Radiculitis, Encounter for Form Completion:  Will fill out disability paperwork and fax back He will continue to follow with PT and pain management  Schedule an appt for your annual exam Webb Silversmith, NP

## 2022-10-05 NOTE — Telephone Encounter (Signed)
  Chief Complaint: elevated blood glucose 443. Received nerve block and and "injection" yesterday at pain clinic.  Symptoms: slight lightheadedness no other sx. Normal fasting glucose 150's. Reports he does not take any medications for diabetes at this time.  Frequency: 30 minutes ago  Pertinent Negatives: Patient denies chest pain no difficulty breathing no headache no frequent urination no thirst  Disposition: '[]'$ ED /'[]'$ Urgent Care (no appt availability in office) / '[]'$ Appointment(In office/virtual)/ '[]'$  [facility]/ '[]'$ Home Care/ '[]'$ Refused Recommended Disposition /'[]'$ [place]/ '[x]'$  Follow-up with PCP Additional Notes:   Recommended to drink 2 glasses of water and recheck glucose in 30 minutes.     Reason for Disposition  Blood glucose > 400 mg/dL (22.2 mmol/L)  Answer Assessment - Initial Assessment Questions 1. BLOOD GLUCOSE: "What is your blood glucose level?"      443 2. ONSET: "When did you check the blood glucose?"     30 minutes  3. USUAL RANGE: "What is your glucose level usually?" (e.g., usual fasting morning value, usual evening value)     Normal - 150's  4. KETONES: "Do you check for ketones (urine or blood test strips)?" If Yes, ask: "What does the test show now?"      na 5. TYPE 1 or 2:  "Do you know what type of diabetes you have?"  (e.g., Type 1, Type 2, Gestational; doesn't know)      Type 2  6. INSULIN: "Do you take insulin?" "What type of insulin(s) do you use? What is the mode of delivery? (syringe, pen; injection or pump)?"      na 7. DIABETES PILLS: "Do you take any pills for your diabetes?" If Yes, ask: "Have you missed taking any pills recently?"     na 8. OTHER SYMPTOMS: "Do you have any symptoms?" (e.g., fever, frequent urination, difficulty breathing, dizziness, weakness, vomiting)     Denies sx except for slight lightheadedness when standing.  9. PREGNANCY: "Is there any chance you are pregnant?" "When was your last menstrual period?"      na  Protocols used: Diabetes - High Blood Sugar-A-AH

## 2022-10-06 ENCOUNTER — Ambulatory Visit: Payer: No Typology Code available for payment source | Admitting: Internal Medicine

## 2022-10-06 ENCOUNTER — Other Ambulatory Visit: Payer: Self-pay | Admitting: Internal Medicine

## 2022-10-06 DIAGNOSIS — K219 Gastro-esophageal reflux disease without esophagitis: Secondary | ICD-10-CM

## 2022-10-06 LAB — COMPLETE METABOLIC PANEL WITH GFR
AG Ratio: 1.7 (calc) (ref 1.0–2.5)
ALT: 22 U/L (ref 9–46)
AST: 18 U/L (ref 10–35)
Albumin: 5 g/dL (ref 3.6–5.1)
Alkaline phosphatase (APISO): 91 U/L (ref 35–144)
BUN: 20 mg/dL (ref 7–25)
CO2: 24 mmol/L (ref 20–32)
Calcium: 10.4 mg/dL — ABNORMAL HIGH (ref 8.6–10.3)
Chloride: 96 mmol/L — ABNORMAL LOW (ref 98–110)
Creat: 1.08 mg/dL (ref 0.70–1.30)
Globulin: 3 g/dL (calc) (ref 1.9–3.7)
Glucose, Bld: 372 mg/dL — ABNORMAL HIGH (ref 65–99)
Potassium: 4.6 mmol/L (ref 3.5–5.3)
Sodium: 132 mmol/L — ABNORMAL LOW (ref 135–146)
Total Bilirubin: 0.7 mg/dL (ref 0.2–1.2)
Total Protein: 8 g/dL (ref 6.1–8.1)
eGFR: 79 mL/min/{1.73_m2} (ref >=60)

## 2022-10-06 LAB — LIPID PANEL
Cholesterol: 260 mg/dL — ABNORMAL HIGH (ref <200)
HDL: 57 mg/dL (ref >=40)
LDL Cholesterol (Calc): 155 mg/dL (calc) — ABNORMAL HIGH
Non-HDL Cholesterol (Calc): 203 mg/dL (calc) — ABNORMAL HIGH (ref <130)
Total CHOL/HDL Ratio: 4.6 (calc) (ref <5.0)
Triglycerides: 315 mg/dL — ABNORMAL HIGH (ref <150)

## 2022-10-06 LAB — CBC
HCT: 44.1 % (ref 38.5–50.0)
Hemoglobin: 14.3 g/dL (ref 13.2–17.1)
MCH: 26.9 pg — ABNORMAL LOW (ref 27.0–33.0)
MCHC: 32.4 g/dL (ref 32.0–36.0)
MCV: 82.9 fL (ref 80.0–100.0)
MPV: 11.1 fL (ref 7.5–12.5)
Platelets: 254 10*3/uL (ref 140–400)
RBC: 5.32 10*6/uL (ref 4.20–5.80)
RDW: 14.9 % (ref 11.0–15.0)
WBC: 10 10*3/uL (ref 3.8–10.8)

## 2022-10-06 LAB — MICROALBUMIN / CREATININE URINE RATIO
Creatinine, Urine: 61 mg/dL (ref 20–320)
Microalb Creat Ratio: 3 mcg/mg creat (ref <30)
Microalb, Ur: 0.2 mg/dL

## 2022-10-06 NOTE — Patient Instructions (Signed)
Hyperglycemia Hyperglycemia is when the sugar (glucose) level in your blood is too high. High blood sugar can happen to people who have or do not have diabetes. High blood sugar can happen quickly. It can be an emergency. What are the causes? If you have diabetes, high blood sugar may be caused by: Medicines that increase blood sugar or affect your control of diabetes. Getting less physical activity. Overeating. Being sick or injured or having an infection. Having surgery. Stress. Not giving yourself enough insulin (if you are taking it). You may have high blood sugar because you have diabetes that has not been diagnosed yet. If you do not have diabetes, high blood sugar may be caused by: Certain medicines. Stress. A bad illness. An infection. Having surgery. Diseases of the pancreas. What increases the risk? This condition is more likely to develop in people who have risk factors for diabetes, such as: Having a family member with diabetes. Certain conditions in which the body's defense system (immune system) attacks itself. These are called autoimmune disorders. Being overweight. Not being active. Having a condition called insulin resistance. Having a history of: Prediabetes. Diabetes when pregnant. Polycystic ovarian syndrome (PCOS). What are the signs or symptoms? This condition may not cause symptoms. If you do have symptoms, they may include: Feeling more thirsty than normal. Needing to pee (urinate) more often than normal. Hunger. Feeling very tired. Blurry eyesight (vision). You may get other symptoms as the condition gets worse, such as: Dry mouth. Pain in your belly (abdomen). Not being hungry (loss of appetite). Breath that smells fruity. Weakness. Weight loss that is not planned. A tingling or numb feeling in your hands or feet. A headache. Cuts or bruises that heal slowly. How is this treated? Treatment depends on the cause of your condition. Treatment may  include: Taking medicine to control your blood sugar levels. Changing your medicine or dosage if you take insulin or other diabetes medicines. Lifestyle changes. These may include: Exercising more. Eating healthier foods. Losing weight. Treating an illness or infection. Checking your blood sugar more often. Stopping or reducing steroid medicines. If your condition gets very bad, you will need to be treated in the hospital. Follow these instructions at home: General instructions Take over-the-counter and prescription medicines only as told by your doctor. Do not smoke or use any products that contain nicotine or tobacco. If you need help quitting, ask your doctor. If you drink alcohol: Limit how much you have to: 0-1 drink a day for women who are not pregnant. 0-2 drinks a day for men. Know how much alcohol is in a drink. In the [place] one drink equals one 12 oz bottle of beer (355 mL), one 5 oz glass of wine (148 mL), or one 1 oz glass of hard liquor (44 mL). Manage stress. If you need help with this, ask your doctor. Do exercises as told by your doctor. Keep all follow-up visits. Eating and drinking  Stay at a healthy weight. Make sure you drink enough fluid when you: Exercise. Get sick. Are in hot temperatures. Drink enough fluid to keep your pee (urine) pale yellow. If you have diabetes:  Know the symptoms of high blood sugar. Follow your diabetes management plan as told by your doctor. Make sure you: Take insulin and medicines as told. Follow your exercise plan. Follow your meal plan. Eat on time. Do not skip meals. Check your blood sugar as often as told. Make sure you check before and after exercise. If you   exercise longer or in a different way, check your blood sugar more often. Follow your sick day plan whenever you cannot eat or drink normally. Make this plan ahead of time with your doctor. Share your diabetes management plan with people in your workplace, school,  and household. Check your pee for ketones when you are ill and as told by your doctor. Carry a card or wear jewelry that says that you have diabetes. Where to find more information American Diabetes Association: www.diabetes.org Contact a doctor if: Your blood sugar level is at or above 240 mg/dL (13.3 mmol/L) for 2 days in a row. You have problems keeping your blood sugar in your target range. You have high blood pressure often. You have signs of illness, such as: Feeling like you may vomit (feeling nauseous). Vomiting. A fever. Get help right away if: Your blood sugar monitor reads "high" even when you are taking insulin. You have trouble breathing. You have a change in how you think, feel, or act (mental status). You feel like you may vomit, and the feeling does not go away. You cannot stop vomiting. These symptoms may be an emergency. Get medical help right away. Call your local emergency services (911 in the [place] Do not wait to see if the symptoms will go away. Do not drive yourself to the hospital. Summary Hyperglycemia is when the sugar (glucose) level in your blood is too high. High blood sugar can happen to people who have or do not have diabetes. Make sure you drink enough fluids and follow your meal plan. Exercise as often as told by your doctor. Contact your doctor if you have problems keeping your blood sugar in your target range. This information is not intended to replace advice given to you by your health care provider. Make sure you discuss any questions you have with your health care provider. Document Revised: 07/18/2020 Document Reviewed: 07/18/2020 Elsevier Patient Education  2023 [employer]  

## 2022-10-07 ENCOUNTER — Encounter (HOSPITAL_COMMUNITY): Payer: Self-pay

## 2022-10-07 ENCOUNTER — Other Ambulatory Visit (HOSPITAL_COMMUNITY): Payer: Self-pay

## 2022-10-07 MED ORDER — OMEPRAZOLE 20 MG PO CPDR
20.0000 mg | DELAYED_RELEASE_CAPSULE | Freq: Every day | ORAL | 0 refills | Status: DC
  Filled 2022-10-07: qty 90, 90d supply, fill #0

## 2022-10-07 NOTE — Telephone Encounter (Signed)
Pt had a telephone visit with the provider so that the forms could be completed and faxed to the needed area.

## 2022-10-07 NOTE — Telephone Encounter (Signed)
Requested Prescriptions  Pending Prescriptions Disp Refills   omeprazole (PRILOSEC) 20 MG capsule 90 capsule 0    Sig: Take 1 capsule (20 mg total) by mouth daily.     Gastroenterology: Proton Pump Inhibitors Passed - 10/06/2022  6:31 PM      Passed - Valid encounter within last 12 months    Recent Outpatient Visits           2 days ago Type 2 diabetes mellitus with hyperglycemia, without long-term current use of insulin [facility])   [facility] [place], [place] W, NP   6 months ago Type 2 diabetes mellitus with hyperglycemia, without long-term current use of insulin [facility])   [facility] [place], Coralie Keens, NP   7 months ago Herpes zoster without complication   [facility] [place], Coralie Keens, NP   1 year ago Encounter for general adult medical examination with abnormal findings   [facility], Coralie Keens, NP       Future Appointments             In 2 weeks Parrett, Fonnie Mu, NP [facility]   In 9 months Hollice Espy, MD [place]

## 2022-10-14 ENCOUNTER — Other Ambulatory Visit: Payer: Self-pay

## 2022-10-20 DIAGNOSIS — M75121 Complete rotator cuff tear or rupture of right shoulder, not specified as traumatic: Secondary | ICD-10-CM | POA: Diagnosis not present

## 2022-10-21 ENCOUNTER — Telehealth (INDEPENDENT_AMBULATORY_CARE_PROVIDER_SITE_OTHER): Payer: 59 | Admitting: Adult Health

## 2022-10-21 ENCOUNTER — Encounter: Payer: Self-pay | Admitting: Adult Health

## 2022-10-21 DIAGNOSIS — E6609 Other obesity due to excess calories: Secondary | ICD-10-CM | POA: Diagnosis not present

## 2022-10-21 DIAGNOSIS — G4733 Obstructive sleep apnea (adult) (pediatric): Secondary | ICD-10-CM | POA: Diagnosis not present

## 2022-10-21 DIAGNOSIS — Z6832 Body mass index (BMI) 32.0-32.9, adult: Secondary | ICD-10-CM

## 2022-10-21 NOTE — Progress Notes (Signed)
Virtual Visit via Video Note  I connected with Marc Aguilar on 10/21/22 at  3:30 PM EST by a video enabled telemedicine application and verified that I am speaking with the correct person using two identifiers.  Location: Patient: Home  Provider: Office    I discussed the limitations of evaluation and management by telemedicine and the availability of in person appointments. The patient expressed understanding and agreed to proceed.  History of Present Illness: 62 year old male seen for sleep consult August 24, 2022 to reestablish for sleep apnea.  Former patient of Dr. Alva Garnet last seen in the office in 2018.  Today's video visit is a 60-monthfollow-up.  Patient was seen last visit to reestablish for sleep apnea.  Patient was initially diagnosed with sleep apnea in early 2000 [place]  He has worn CPAP on and off over the years.  Does feel better when he wears CPAP.  However has felt that is been very uncomfortable.  Last visit CPAP pressure was changed to 5 to 12 cm H2O.  Patient says he is trying to wear his CPAP more.  Feels that he is more rested.  Less daytime sleepiness.  And sleeps more soundly.  CPAP download shows 90% compliance with daily average usage at 7 hours.  Patient is on auto CPAP 5 to 12 cm H2O.  AHI is 1.4/hour.  Positive mask leaks.  Daily average pressure at 9.8 cm H2O.  Past Medical History:  Diagnosis Date   Anxiety    Arthritis    "knees and hips" (02/28/2018)   Cancer (HCC)    prostate    Chicken pox    Depression    GERD (gastroesophageal reflux disease)    Heart murmur    Hernia, abdominal    High cholesterol    History of gout    Hypertension    Migraine    "probably monthly" (02/28/2018)   Neck pain    OSA on CPAP    Pneumonia    "once" (02/28/2018)   Refusal of blood transfusions as patient is Jehovah's Witness    Seasonal allergies    Type II diabetes mellitus ([place]     Current Outpatient Medications on File Prior  to Visit  Medication Sig Dispense Refill   acetaminophen (TYLENOL) 500 MG tablet Take 2 tablets (1,000 mg total) by mouth every 8 (eight) hours. 90 tablet 2   albuterol (VENTOLIN HFA) 108 (90 Base) MCG/ACT inhaler Inhale 2 puffs into the lungs every 6 (six) hours as needed for wheezing or shortness of breath. 1 Inhaler 0   Alcohol Swabs (ALCOHOL PREP) PADS 1 each by Does not apply route 2 (two) times daily. 200 each 3   amLODipine (NORVASC) 10 MG tablet Take 1 tablet (10 mg total) by mouth daily. 90 tablet 1   atorvastatin (LIPITOR) 20 MG tablet Take 1 tablet (20 mg total) by mouth daily. (Patient taking differently: Take 20 mg by mouth at bedtime.) 90 tablet 0   buPROPion (WELLBUTRIN XL) 300 MG 24 hr tablet Take 1 tablet (300 mg total) by mouth daily. 90 tablet 0   citalopram (CELEXA) 40 MG tablet TAKE 1 TABLET (40 MG TOTAL) BY MOUTH DAILY. 90 tablet 0   cyclobenzaprine (FLEXERIL) 10 MG tablet Take 1 tablet (10 mg total) by mouth daily as needed for muscle spasms. 90 tablet 0   ezetimibe (ZETIA) 10 MG tablet Take 1 tablet (10 mg total) by mouth daily. 90 tablet 0   glipiZIDE (GLIPIZIDE  XL) 5 MG 24 hr tablet Take 1 tablet (5 mg total) by mouth daily with breakfast. 90 tablet 0   Lancets MISC 1 each by Does not apply route in the morning and at bedtime. 200 each 2   Multiple Vitamins-Minerals (MULTIVITAMIN WITH MINERALS) tablet Take 1 tablet by mouth daily.     omeprazole (PRILOSEC) 20 MG capsule Take 1 capsule (20 mg total) by mouth daily. 90 capsule 0   oxyCODONE (OXY IR/ROXICODONE) 5 MG immediate release tablet Take 1-2 tablets (5-10 mg total) by mouth every 4 (four) hours as needed for pain. 30 tablet 0   oxyCODONE (ROXICODONE) 5 MG immediate release tablet Take 1-2 tablets (5-10 mg total) by mouth every 4 (four) hours as needed (pain). 30 tablet 0   tadalafil (CIALIS) 20 MG tablet Take 1 tablet (20 mg total) by mouth daily as needed for erectile dysfunction. 10 tablet 6   traZODone (DESYREL) 50  MG tablet Take 1 tablet (50 mg total) by mouth at bedtime as needed for sleep. 90 tablet 0   No current facility-administered medications on file prior to visit.      Observations/Objective: Home sleep study January 01, 2017 moderate obstructive sleep apnea with AHI of 22/hour, severe sleep apnea in the supine position with AHI of 30/hour, SPO2 low at 81%.   Assessment and Plan: Moderate obstructive sleep apnea-improved control and compliance on CPAP.  Patient is encouraged on CPAP usage each night.  Plan  Patient Instructions  Continue on CPAP At bedtime , wear all night long for at least 6 hr or more  Healthy sleep regimen  Work on healthy weight loss  Do not drive if sleepy  Saline nasal rinses Twice daily   Saline nasal gel At bedtime   Follow up in 1 year and As needed      Follow Up Instructions:    I discussed the assessment and treatment plan with the patient. The patient was provided an opportunity to ask questions and all were answered. The patient agreed with the plan and demonstrated an understanding of the instructions.   The patient was advised to call back or seek an in-person evaluation if the symptoms worsen or if the condition fails to improve as anticipated.  I provided 21 minutes of non-face-to-face time during this encounter.   Rexene Edison, NP

## 2022-10-21 NOTE — Patient Instructions (Addendum)
Continue on CPAP At bedtime , wear all night long for at least 6 hr or more  Healthy sleep regimen  Work on healthy weight loss  Do not drive if sleepy  Saline nasal rinses Twice daily   Saline nasal gel At bedtime   Follow up in 1 year and As needed

## 2022-10-22 NOTE — Progress Notes (Signed)
Reviewed and agree with assessment/plan.   Chesley Mires, MD [facility] 10/22/2022, 8:27 AM Pager:  [phone]

## 2022-11-15 ENCOUNTER — Ambulatory Visit
Payer: 59 | Attending: Student in an Organized Health Care Education/Training Program | Admitting: Student in an Organized Health Care Education/Training Program

## 2022-11-15 ENCOUNTER — Encounter: Payer: Self-pay | Admitting: Student in an Organized Health Care Education/Training Program

## 2022-11-15 VITALS — BP 133/94 | HR 87 | Temp 98.2°F | Resp 17 | Ht 71.0 in | Wt 221.0 lb

## 2022-11-15 DIAGNOSIS — G894 Chronic pain syndrome: Secondary | ICD-10-CM | POA: Insufficient documentation

## 2022-11-15 DIAGNOSIS — M50121 Cervical disc disorder at C4-C5 level with radiculopathy: Secondary | ICD-10-CM | POA: Insufficient documentation

## 2022-11-15 DIAGNOSIS — M5010 Cervical disc disorder with radiculopathy, unspecified cervical region: Secondary | ICD-10-CM | POA: Diagnosis not present

## 2022-11-15 DIAGNOSIS — M4802 Spinal stenosis, cervical region: Secondary | ICD-10-CM | POA: Insufficient documentation

## 2022-11-15 DIAGNOSIS — M5412 Radiculopathy, cervical region: Secondary | ICD-10-CM

## 2022-11-15 NOTE — Progress Notes (Signed)
Safety precautions to be maintained throughout the outpatient stay will include: orient to surroundings, keep bed in low position, maintain call bell within reach at all times, provide assistance with transfer out of bed and ambulation.   Pt referred to PCP for elevated BP

## 2022-11-15 NOTE — Progress Notes (Signed)
PROVIDER NOTE: Information contained herein reflects review and annotations entered in association with encounter. Interpretation of such information and data should be left to medically-trained personnel. Information provided to patient can be located elsewhere in the medical record under "Patient Instructions". Document created using STT-dictation technology, any transcriptional errors that may result from process are unintentional.    Patient: Marc Aguilar  Service Category: E/M  Provider: Gillis Santa, MD  DOB: 1961/04/07  DOS: 11/15/2022  Referring Provider: [name], NP  MRN: [id number]  Specialty: Interventional Pain Management  PCP: [name], NP  Type: Established Patient  Setting: Ambulatory outpatient    Location: Office  Delivery: Face-to-face     HPI  Marc Aguilar, a 62 y.o. year old male, is here today because of his Cervical radiculitis [M54.12]. Mr. Raffel primary complain today is Shoulder Pain (bilat) and Neck Pain Last encounter: My last encounter with him was on 10/04/2022. Pertinent problems: Mr. Zerbe has Migraines; Anxiety and depression; Cervical radiculitis; and Herniation of cervical intervertebral disc with radiculopathy on their pertinent problem list. Pain Assessment: Severity of Chronic pain is reported as a 3 /10. Location: Shoulder Right, Left, Other (Comment) (worse on right)/radiates down arms to fingers bilat. Onset: More than a month ago. Quality: Numbness. Timing: Constant. Modifying factor(s): procerdures. Vitals:  height is '5\' 11"'$  (1.803 m) and weight is 221 lb (100.2 kg). His temporal temperature is 98.2 F (36.8 C). His blood pressure is 133/94 (abnormal) and his pulse is 87. His respiration is 17 and oxygen saturation is 100%.  BMI: Estimated body mass index is 30.82 kg/m as calculated from the following:   Height as of this encounter: '5\' 11"'$  (1.803 m).   Weight as of this encounter: 221 lb (100.2 kg).  Reason for encounter:  post-procedure evaluation and assessment.   Post-procedure evaluation   Type: Suprascapular nerve block (SSNB) #1  Laterality:  Right Level: Superior to scapular spine, lateral to supraspinatus fossa (Suprascapular notch).  Imaging: Fluoroscopic guidance         Anesthesia: Local anesthesia (1-2% Lidocaine) Anxiolysis: Valium 10 mg PO DOS: 10/04/2022  Performed by: Gillis Santa, MD  Procedure Type: Cervical Epidural Steroid injection (ESI) (Interlaminar) #2  Laterality: Midline  Level: C7-T1 Imaging: Fluoroscopy-assisted DOS: 10/04/2022  Performed by: Gillis Santa, MD Anesthesia: Local anesthesia (1-2% Lidocaine) Anxiolysis: Oral Valium 10 mg Sedation:    minimal            NAS-11 score:   Pre-procedure: 5 /10   Post-procedure: 2 /10      Effectiveness:  Initial hour after procedure: 100 %  Subsequent 4-6 hours post-procedure: 90 %  Analgesia past initial 6 hours: 100 % (X 3 weeks)  Ongoing improvement:  Analgesic:  <25% Function: Back to baseline ROM: Back to baseline   ROS  Constitutional:  Right occipital headaches Gastrointestinal: No reported hemesis, hematochezia, vomiting, or acute GI distress Musculoskeletal:  Right cervical spine pain with radiation into right arm forearm and fingers.   Neurological: No reported episodes of acute onset apraxia, aphasia, dysarthria, agnosia, amnesia, paralysis, loss of coordination, or loss of consciousness  Medication Review  Alcohol Prep, Lancets, acetaminophen, albuterol, amLODipine, atorvastatin, buPROPion, citalopram, cyclobenzaprine, ezetimibe, glipiZIDE, multivitamin with minerals, omeprazole, oxyCODONE, tadalafil, and traZODone  History Review  Allergy: Mr. Hellberg is allergic to tape. Drug: Mr. Brennen  reports no history of drug use. Alcohol:  reports current alcohol use of about 12.0 standard drinks of alcohol per week. Tobacco:  reports that he  quit smoking about 13 years ago. His smoking use included cigarettes. He  has a 27.00 pack-year smoking history. He quit smokeless tobacco use about 28 years ago.  His smokeless tobacco use included chew. Social: Mr. Rodriquez  reports that he quit smoking about 13 years ago. His smoking use included cigarettes. He has a 27.00 pack-year smoking history. He quit smokeless tobacco use about 28 years ago.  His smokeless tobacco use included chew. He reports current alcohol use of about 12.0 standard drinks of alcohol per week. He reports that he does not use drugs. Medical:  has a past medical history of Anxiety, Arthritis, Cancer ([place]), Chicken pox, Depression, GERD (gastroesophageal reflux disease), Heart murmur, Hernia, abdominal, High cholesterol, History of gout, Hypertension, Migraine, Neck pain, OSA on CPAP, Pneumonia, Refusal of blood transfusions as patient is Jehovah's Witness, Seasonal allergies, and Type II diabetes mellitus ([place]). Surgical: Mr. Bogan  has a past surgical history that includes Cardiac catheterization (02/28/2018); Wrist ganglion excision (Left, 1984); LEFT HEART CATH AND CORONARY ANGIOGRAPHY (N/A, 02/28/2018); Robot assisted laparoscopic radical prostatectomy (N/A, 11/19/2019); and Shoulder arthroscopy with rotator cuff repair and subacromial decompression (Right, 05/21/2022). Family: family history includes Arthritis in his maternal grandfather, maternal grandmother, and paternal grandmother; Bladder Cancer in his father; Diabetes in his brother, maternal aunt, maternal grandmother, mother, and sister; Heart disease in his brother; Hyperlipidemia in his mother; Liver cancer in his sister.  Laboratory Chemistry Profile   Renal Lab Results  Component Value Date   BUN 20 10/05/2022   CREATININE 1.08 10/05/2022   LABCREA 61 10/05/2022   BCR SEE NOTE: 10/05/2022   GFR 87.70 01/01/2021   GFRAA >60 11/20/2019   GFRNONAA >60 09/26/2020    Hepatic Lab Results  Component Value Date   AST 18 10/05/2022   ALT 22 10/05/2022   ALBUMIN 4.4 01/01/2021   ALKPHOS 78  01/01/2021   HCVAB NEGATIVE 10/02/2015   AMYLASE 111 09/30/2020   LIPASE 53.0 09/30/2020    Electrolytes Lab Results  Component Value Date   NA 132 (L) 10/05/2022   K 4.6 10/05/2022   CL 96 (L) 10/05/2022   CALCIUM 10.4 (H) 10/05/2022    Bone Lab Results  Component Value Date   VD25OH 34.21 11/14/2018    Inflammation (CRP: Acute Phase) (ESR: Chronic Phase) No results found for: "CRP", "ESRSEDRATE", "LATICACIDVEN"       Note: Above Lab results reviewed.  Recent Imaging Review  CLINICAL DATA:  Provided history: Cervical radiculitis. Cervical radiculopathy, no red flags. Additional history provided by scanning technologist: Patient reports neck pain with bilateral shoulder pain and arm numbness (worse on right side), symptoms for 2 years.   EXAM: MRI CERVICAL SPINE WITHOUT CONTRAST   TECHNIQUE: Multiplanar, multisequence MR imaging of the cervical spine was performed. No intravenous contrast was administered.   COMPARISON:  Cervical spine radiographs 08/13/2021.   FINDINGS: Alignment: Straightening of the expected cervical lordosis. Trace C2-C3 grade 1 anterolisthesis. Trace C4-C5 grade 1 retrolisthesis.   Vertebrae: Vertebral body height is maintained. No significant marrow edema or focal suspicious osseous lesion.   Cord: No signal abnormality identified within the cervical spinal cord.   Posterior Fossa, vertebral arteries, paraspinal tissues: No abnormality identified within included portions of the posterior fossa. Flow voids preserved within the imaged cervical vertebral arteries. No paraspinal mass or collection.   Disc levels:   Multilevel disc degeneration, greatest at C4-C5 (mild-to-moderate) and C5-C6 (moderate).   C2-C3: Trace grade 1 anterolisthesis. No significant disc herniation or stenosis.  C3-C4: Small central disc protrusion. Uncovertebral hypertrophy (predominantly on the right). The disc protrusion results in mild focal effacement of  the ventral thecal sac and may contact the ventral spinal cord. Mild right neural foraminal narrowing.   C4-C5: Trace grade 1 retrolisthesis. Disc bulge. Uncovertebral hypertrophy (predominantly on the right). The disc protrusion effaces the ventral thecal sac, mildly narrowing the spinal canal and contacting the ventral spinal cord. Moderate right neural foraminal narrowing.   C5-C6: Disc bulge with left greater than right uncovertebral hypertrophy. Mild relative spinal canal narrowing (without significant spinal cord mass effect). Bilateral neural foraminal narrowing (moderate right, moderate/severe left).   C6-C7: No significant disc herniation or stenosis.   C7-T1: Mild facet arthrosis on the left. No significant disc herniation or stenosis.   IMPRESSION: Cervical spondylosis, as outlined.   No more than mild spinal canal stenosis.   Multilevel foraminal stenosis, as detailed and greatest on the right at C4-C5 (moderate) and bilaterally at C5-C6 (moderate right, moderate/severe left).   Disc degeneration is greatest at C4-C5 (mild-to-moderate) and C5-C6 (moderate).   Nonspecific straightening of the expected cervical lordosis.    Physical Exam  General appearance: Well nourished, well developed, and well hydrated. In no apparent acute distress Mental status: Alert, oriented x 3 (person, place, & time)       Respiratory: No evidence of acute respiratory distress Eyes: PERLA Vitals: BP (!) 133/94   Pulse 87   Temp 98.2 F (36.8 C) (Temporal)   Resp 17   Ht '5\' 11"'$  (1.803 m)   Wt 221 lb (100.2 kg)   SpO2 100%   BMI 30.82 kg/m  BMI: Estimated body mass index is 30.82 kg/m as calculated from the following:   Height as of this encounter: '5\' 11"'$  (1.803 m).   Weight as of this encounter: 221 lb (100.2 kg). Ideal: Ideal body weight: 75.3 kg (166 lb 0.1 oz) Adjusted ideal body weight: 85.3 kg (188 lb 0.1 oz)  Cervical Spine Area Exam   Alignment:  Symmetrical Functional ROM: Pain restricted ROM      Stability: No instability detected Muscle Tone/Strength: Functionally intact. No obvious neuro-muscular anomalies detected. Sensory (Neurological): Neurogenic pain pattern, positive Spurling's on the right Palpation: No palpable anomalies              Upper Extremity (UE) Exam      Side: Right upper extremity   Side: Left upper extremity  Skin & Extremity Inspection: Skin color, temperature, and hair growth are WNL. No peripheral edema or cyanosis. No masses, redness, swelling, asymmetry, or associated skin lesions. No contractures.   Skin & Extremity Inspection: Skin color, temperature, and hair growth are WNL. No peripheral edema or cyanosis. No masses, redness, swelling, asymmetry, or associated skin lesions. No contractures.  Functional ROM: Pain restricted ROM for shoulder and elbow   Functional ROM: Unrestricted ROM          Muscle Tone/Strength: Functionally intact. No obvious neuro-muscular anomalies detected.   Muscle Tone/Strength: Functionally intact. No obvious neuro-muscular anomalies detected.  Sensory (Neurological): Dermatomal pain pattern           Sensory (Neurological): Unimpaired          Palpation: No palpable anomalies               Palpation: No palpable anomalies              Provocative Test(s):  Phalen's test: deferred Tinel's test: deferred Apley's scratch test (touch opposite shoulder):  Action 1 (Across  chest): decreased Action 2 (Overhead): decreased Action 3 (LB reach): decreased     Provocative Test(s):  Phalen's test: deferred Tinel's test: deferred Apley's scratch test (touch opposite shoulder):  Action 1 (Across chest): deferred Action 2 (Overhead): deferred Action 3 (LB reach): deferred       Assessment   Diagnosis Status  1. Cervical radiculitis   2. Cervical radicular pain   3. Herniation of cervical intervertebral disc with radiculopathy   4. Chronic pain syndrome     Persistent Persistent Persistent     Plan of Care  Patient obtained 100% pain relief after his cervical ESI and right suprascapular nerve block that was done 10/04/2022 however he states that his pain is returned and is fairly severe.  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  He states that he is also experiencing more occipital and parietal headaches.  He does have moderate to severe right C4-C5, C5-C6 foraminal stenosis and may be a candidate for an ACDF at C4-C5 and C5-C6 given symptom persistence and severity.  I will place a referral to Dr. Cari Caraway with neurosurgery for evaluation.  Orders:  Orders Placed This Encounter  Procedures   Ambulatory referral to Neurosurgery    Referral Priority:   Routine    Referral Type:   Surgical    Referral Reason:   Specialty Services Required    Referred to Provider:   Meade Maw, MD    Requested Specialty:   Neurosurgery    Number of Visits Requested:   1   Follow-up plan:   Return if symptoms worsen or fail to improve.     C7-T1 ESI 06/30/22, 10/04/22    Recent Visits Date Type Provider Dept  10/04/22 Procedure visit Gillis Santa, MD [facility]  08/23/22 Office Visit Gillis Santa, MD [facility]  Showing recent visits within past 90 days and meeting all other requirements Today's Visits Date Type Provider Dept  11/15/22 Office Visit Gillis Santa, MD [facility]  Showing today's visits and meeting all other requirements Future Appointments No visits were found meeting these conditions. Showing future appointments within next 90 days and meeting all other requirements  I discussed the assessment and treatment plan with the patient. The patient was provided an opportunity to ask questions and all were answered. The patient agreed with the plan and demonstrated an understanding of the instructions.  Patient advised to call back or seek an in-person  evaluation if the symptoms or condition worsens.  Duration of encounter: 62mnutes.  Total time on encounter, as per AMA guidelines included both the face-to-face and non-face-to-face time personally spent by the physician and/or other qualified health care professional(s) on the day of the encounter (includes time in activities that require the physician or other qualified health care professional and does not include time in activities normally performed by clinical staff). Physician's time may include the following activities when performed: Preparing to see the patient (e.g., pre-charting review of records, searching for previously ordered imaging, lab work, and nerve conduction tests) Review of prior analgesic pharmacotherapies. Reviewing PMP Interpreting ordered tests (e.g., lab work, imaging, nerve conduction tests) Performing post-procedure evaluations, including interpretation of diagnostic procedures Obtaining and/or reviewing separately obtained history Performing a medically appropriate examination and/or evaluation Counseling and educating the patient/family/caregiver Ordering medications, tests, or procedures Referring and communicating with other health care professionals (when not separately reported) Documenting clinical information in the electronic or other health record Independently interpreting results (not separately reported)  and communicating results to the patient/ family/caregiver Care coordination (not separately reported)  Note by: Gillis Santa, MD Date: 11/15/2022; Time: 3:11 PM

## 2022-11-15 NOTE — Patient Instructions (Signed)
You have been referred to neurosurgery. 

## 2022-11-17 DIAGNOSIS — Z9889 Other specified postprocedural states: Secondary | ICD-10-CM | POA: Diagnosis not present

## 2022-11-17 DIAGNOSIS — M6281 Muscle weakness (generalized): Secondary | ICD-10-CM | POA: Diagnosis not present

## 2022-11-17 DIAGNOSIS — M25511 Pain in right shoulder: Secondary | ICD-10-CM | POA: Diagnosis not present

## 2022-11-17 DIAGNOSIS — M25611 Stiffness of right shoulder, not elsewhere classified: Secondary | ICD-10-CM | POA: Diagnosis not present

## 2022-11-22 DIAGNOSIS — M25511 Pain in right shoulder: Secondary | ICD-10-CM | POA: Diagnosis not present

## 2022-11-22 DIAGNOSIS — M6281 Muscle weakness (generalized): Secondary | ICD-10-CM | POA: Diagnosis not present

## 2022-11-22 DIAGNOSIS — Z9889 Other specified postprocedural states: Secondary | ICD-10-CM | POA: Diagnosis not present

## 2022-11-22 DIAGNOSIS — M25611 Stiffness of right shoulder, not elsewhere classified: Secondary | ICD-10-CM | POA: Diagnosis not present

## 2022-11-23 ENCOUNTER — Other Ambulatory Visit: Payer: Self-pay

## 2022-11-23 ENCOUNTER — Encounter: Payer: Self-pay | Admitting: Internal Medicine

## 2022-11-23 ENCOUNTER — Ambulatory Visit (INDEPENDENT_AMBULATORY_CARE_PROVIDER_SITE_OTHER): Payer: 59 | Admitting: Internal Medicine

## 2022-11-23 VITALS — BP 128/88 | HR 100 | Temp 96.2°F | Wt 224.0 lb

## 2022-11-23 DIAGNOSIS — R1013 Epigastric pain: Secondary | ICD-10-CM

## 2022-11-23 DIAGNOSIS — R110 Nausea: Secondary | ICD-10-CM

## 2022-11-23 DIAGNOSIS — E119 Type 2 diabetes mellitus without complications: Secondary | ICD-10-CM

## 2022-11-23 DIAGNOSIS — K219 Gastro-esophageal reflux disease without esophagitis: Secondary | ICD-10-CM

## 2022-11-23 MED ORDER — CITALOPRAM HYDROBROMIDE 40 MG PO TABS
40.0000 mg | ORAL_TABLET | Freq: Every day | ORAL | 0 refills | Status: DC
  Filled 2022-11-23: qty 90, 90d supply, fill #0

## 2022-11-23 MED ORDER — METOCLOPRAMIDE HCL 5 MG PO TABS
5.0000 mg | ORAL_TABLET | Freq: Two times a day (BID) | ORAL | 0 refills | Status: DC
  Filled 2022-11-23: qty 60, 30d supply, fill #0

## 2022-11-23 MED ORDER — OMEPRAZOLE 40 MG PO CPDR
40.0000 mg | DELAYED_RELEASE_CAPSULE | Freq: Every day | ORAL | 0 refills | Status: DC
  Filled 2022-11-23: qty 90, 90d supply, fill #0

## 2022-11-23 NOTE — Progress Notes (Signed)
Subjective:    Patient ID: Marc Aguilar, male    DOB: 26-Oct-1960, 62 y.o.   MRN: [id number]  HPI  Patient presents to clinic today with complaint of nausea and abdominal pain.  This started 1 week ago, but worsened in the last 3 days.  He describes the pain as a dull pain in the epigastric region.  He vomited x 1 the night before last after taking Pepto Bismol. He is burping a lot but denies feeling bloated or gassy. He denies denies constipation, diarrhea or blood in his stool. He denies recent change in diet or medication. He thought this may be related to alcohol but he has cut back this has not made a difference. He has also tried Tums, Alka Seltzer, Lemon with salt with minimal relief of symptoms. He denies chest pain, chest tightness or shortness of breath. He does have a history of GERD, managed on Omeprazole.  Review of Systems     Past Medical History:  Diagnosis Date   Anxiety    Arthritis    "knees and hips" (02/28/2018)   Cancer (HCC)    prostate    Chicken pox    Depression    GERD (gastroesophageal reflux disease)    Heart murmur    Hernia, abdominal    High cholesterol    History of gout    Hypertension    Migraine    "probably monthly" (02/28/2018)   Neck pain    OSA on CPAP    Pneumonia    "once" (02/28/2018)   Refusal of blood transfusions as patient is Jehovah's Witness    Seasonal allergies    Type II diabetes mellitus ([facility])     Current Outpatient Medications  Medication Sig Dispense Refill   acetaminophen (TYLENOL) 500 MG tablet Take 2 tablets (1,000 mg total) by mouth every 8 (eight) hours. 90 tablet 2   albuterol (VENTOLIN HFA) 108 (90 Base) MCG/ACT inhaler Inhale 2 puffs into the lungs every 6 (six) hours as needed for wheezing or shortness of breath. 1 Inhaler 0   Alcohol Swabs (ALCOHOL PREP) PADS 1 each by Does not apply route 2 (two) times daily. 200 each 3   amLODipine (NORVASC) 10 MG tablet Take 1 tablet (10 mg total) by mouth daily. 90 tablet 1    atorvastatin (LIPITOR) 20 MG tablet Take 1 tablet (20 mg total) by mouth daily. (Patient taking differently: Take 20 mg by mouth at bedtime.) 90 tablet 0   buPROPion (WELLBUTRIN XL) 300 MG 24 hr tablet Take 1 tablet (300 mg total) by mouth daily. 90 tablet 0   citalopram (CELEXA) 40 MG tablet TAKE 1 TABLET (40 MG TOTAL) BY MOUTH DAILY. 90 tablet 0   cyclobenzaprine (FLEXERIL) 10 MG tablet Take 1 tablet (10 mg total) by mouth daily as needed for muscle spasms. 90 tablet 0   ezetimibe (ZETIA) 10 MG tablet Take 1 tablet (10 mg total) by mouth daily. 90 tablet 0   glipiZIDE (GLIPIZIDE XL) 5 MG 24 hr tablet Take 1 tablet (5 mg total) by mouth daily with breakfast. 90 tablet 0   Lancets MISC 1 each by Does not apply route in the morning and at bedtime. 200 each 2   Multiple Vitamins-Minerals (MULTIVITAMIN WITH MINERALS) tablet Take 1 tablet by mouth daily.     omeprazole (PRILOSEC) 20 MG capsule Take 1 capsule (20 mg total) by mouth daily. 90 capsule 0   tadalafil (CIALIS) 20 MG tablet Take 1 tablet (20 mg total)  by mouth daily as needed for erectile dysfunction. 10 tablet 6   traZODone (DESYREL) 50 MG tablet Take 1 tablet (50 mg total) by mouth at bedtime as needed for sleep. 90 tablet 0   No current facility-administered medications for this visit.    Allergies  Allergen Reactions   Tape Rash    Paper tape-blisters Paper tape-blisters    Family History  Problem Relation Age of Onset   Diabetes Mother    Hyperlipidemia Mother    Bladder Cancer Father    Liver cancer Sister    Diabetes Sister    Diabetes Maternal Aunt    Arthritis Maternal Grandmother    Diabetes Maternal Grandmother    Arthritis Maternal Grandfather    Arthritis Paternal Grandmother    Diabetes Brother    Heart disease Brother    Colon cancer Neg Hx     Social History   Socioeconomic History   Marital status: Married    Spouse name: Not on file   Number of children: 2   Years of education: Not on file    Highest education level: Not on file  Occupational History   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  Tobacco Use   Smoking status: Former    Packs/day: 1.00    Years: 27.00    Total pack years: 27.00    Types: Cigarettes    Quit date: 10/18/2009    Years since quitting: 13.1   Smokeless tobacco: Former    Types: Chew    Quit date: 10/18/1994  Vaping Use   Vaping Use: Never used  Substance and Sexual Activity   Alcohol use: Yes    Alcohol/week: 12.0 standard drinks of alcohol    Types: 12 Cans of beer per week   Drug use: Never   Sexual activity: Not on file  Other Topics Concern   Not on file  Social History Narrative   Not on file   Social Determinants of Health   Financial Resource Strain: Not on file  Food Insecurity: Not on file  Transportation Needs: Not on file  Physical Activity: Not on file  Stress: Not on file  Social Connections: Not on file  Intimate Partner Violence: Not on file     Constitutional: Denies fever, malaise, fatigue, headache or abrupt weight changes.  Respiratory: Denies difficulty breathing, shortness of breath, cough or sputum production.   Cardiovascular: Denies chest pain, chest tightness, palpitations or swelling in the hands or feet.  Gastrointestinal: Patient reports nausea and abdominal pain.  Denies bloating, constipation, diarrhea or blood in the stool.  GU: Denies urgency, frequency, pain with urination, burning sensation, blood in urine, odor or discharge.  No other specific complaints in a complete review of systems (except as listed in HPI above).  Objective:   Physical Exam BP 128/88 (BP Location: Left Arm, Patient Position: Sitting, Cuff Size: Large)   Pulse 100   Temp (!) 96.2 F (35.7 C) (Temporal)   Wt 224 lb (101.6 kg)   SpO2 99%   BMI 31.24 kg/m   Wt Readings from Last 3 Encounters:  11/15/22 221 lb (100.2 kg)  10/05/22 236 lb (107 kg)  10/04/22 233 lb (105.7 kg)    General: Appears his stated age, obese, in  NAD. Cardiovascular: Tachycardic with normal rhythm. S1,S2 noted.  No murmur, rubs or gallops noted.  Pulmonary/Chest: Normal effort and positive vesicular breath sounds. No respiratory distress. No wheezes, rales or ronchi noted.  Abdomen: Soft and tender in the epigastric region.  Hyperactive  bowel sounds.  Tympanic to percussion.  Ventral hernia noted.  Musculoskeletal: No difficulty with gait.  Neurological: Alert and oriented.   BMET    Component Value Date/Time   NA 132 (L) 10/05/2022 1456   K 4.6 10/05/2022 1456   CL 96 (L) 10/05/2022 1456   CO2 24 10/05/2022 1456   GLUCOSE 372 (H) 10/05/2022 1456   BUN 20 10/05/2022 1456   CREATININE 1.08 10/05/2022 1456   CALCIUM 10.4 (H) 10/05/2022 1456   GFRNONAA >60 09/26/2020 1951   GFRAA >60 11/20/2019 0445    Lipid Panel     Component Value Date/Time   CHOL 260 (H) 10/05/2022 1456   CHOL 157 10/26/2019 1203   TRIG 315 (H) 10/05/2022 1456   HDL 57 10/05/2022 1456   HDL 48 10/26/2019 1203   CHOLHDL 4.6 10/05/2022 1456   VLDL 60.2 (H) 01/01/2021 1047   LDLCALC 155 (H) 10/05/2022 1456    CBC    Component Value Date/Time   WBC 10.0 10/05/2022 1456   RBC 5.32 10/05/2022 1456   HGB 14.3 10/05/2022 1456   HCT 44.1 10/05/2022 1456   PLT 254 10/05/2022 1456   MCV 82.9 10/05/2022 1456   MCH 26.9 (L) 10/05/2022 1456   MCHC 32.4 10/05/2022 1456   RDW 14.9 10/05/2022 1456   LYMPHSABS 3.0 09/26/2020 1951   MONOABS 0.6 09/26/2020 1951   EOSABS 0.2 09/26/2020 1951   BASOSABS 0.1 09/26/2020 1951    Hgb A1C Lab Results  Component Value Date   HGBA1C 7.2 (A) 10/05/2022           Assessment & Plan:   Nausea, Abdominal Pain, GERD, DM 2:  DDx include GERD, gastritis, hiatal hernia issue, H. pylori, diabetic gastroparesis, alcohol induced pancreatitis Will check c-Met, lipase and H. Pylori Increase Omeprazole to 40 mg daily Rx for Metoclopramide 5 mg twice daily Referral to GI for further evaluation, possible  EGD  Schedule an appointment for your annual exam Webb Silversmith, NP

## 2022-11-23 NOTE — Patient Instructions (Signed)
Abdominal Pain, Adult Many things can cause belly (abdominal) pain. Most times, belly pain is not dangerous. Many cases of belly pain can be watched and treated at home. Sometimes, though, belly pain is serious. Your doctor will try to find the cause of your belly pain. Follow these instructions at home:  Medicines Take over-the-counter and prescription medicines only as told by your doctor. Do not take medicines that help you poop (laxatives) unless told by your doctor. General instructions Watch your belly pain for any changes. Drink enough fluid to keep your pee (urine) pale yellow. Keep all follow-up visits as told by your doctor. This is important. Contact a doctor if: Your belly pain changes or gets worse. You are not hungry, or you lose weight without trying. You are having trouble pooping (constipated) or have watery poop (diarrhea) for more than 2-3 days. You have pain when you pee or poop. Your belly pain wakes you up at night. Your pain gets worse with meals, after eating, or with certain foods. You are vomiting and cannot keep anything down. You have a fever. You have blood in your pee. Get help right away if: Your pain does not go away as soon as your doctor says it should. You cannot stop vomiting. Your pain is only in areas of your belly, such as the right side or the left lower part of the belly. You have bloody or black poop, or poop that looks like tar. You have very bad pain, cramping, or bloating in your belly. You have signs of not having enough fluid or water in your body (dehydration), such as: Dark pee, very little pee, or no pee. Cracked lips. Dry mouth. Sunken eyes. Sleepiness. Weakness. You have trouble breathing or chest pain. Summary Many cases of belly pain can be watched and treated at home. Watch your belly pain for any changes. Take over-the-counter and prescription medicines only as told by your doctor. Contact a doctor if your belly pain  changes or gets worse. Get help right away if you have very bad pain, cramping, or bloating in your belly. This information is not intended to replace advice given to you by your health care provider. Make sure you discuss any questions you have with your health care provider. Document Revised: 02/12/2019 Document Reviewed: 02/12/2019 Elsevier Patient Education  2023 Elsevier Inc.  

## 2022-11-24 DIAGNOSIS — R110 Nausea: Secondary | ICD-10-CM | POA: Diagnosis not present

## 2022-11-24 DIAGNOSIS — R1013 Epigastric pain: Secondary | ICD-10-CM | POA: Diagnosis not present

## 2022-11-24 LAB — COMPLETE METABOLIC PANEL WITH GFR
AG Ratio: 1.5 (calc) (ref 1.0–2.5)
ALT: 20 U/L (ref 9–46)
AST: 22 U/L (ref 10–35)
Albumin: 4.4 g/dL (ref 3.6–5.1)
Alkaline phosphatase (APISO): 79 U/L (ref 35–144)
BUN: 13 mg/dL (ref 7–25)
CO2: 27 mmol/L (ref 20–32)
Calcium: 9.7 mg/dL (ref 8.6–10.3)
Chloride: 102 mmol/L (ref 98–110)
Creat: 1.11 mg/dL (ref 0.70–1.30)
Globulin: 2.9 g/dL (calc) (ref 1.9–3.7)
Glucose, Bld: 145 mg/dL — ABNORMAL HIGH (ref 65–99)
Potassium: 3.9 mmol/L (ref 3.5–5.3)
Sodium: 140 mmol/L (ref 135–146)
Total Bilirubin: 1 mg/dL (ref 0.2–1.2)
Total Protein: 7.3 g/dL (ref 6.1–8.1)
eGFR: 76 mL/min/{1.73_m2} (ref >=60)

## 2022-11-24 LAB — LIPASE: Lipase: 41 U/L (ref 7–60)

## 2022-11-26 ENCOUNTER — Other Ambulatory Visit: Payer: Self-pay

## 2022-11-26 LAB — HELICOBACTER PYLORI  SPECIAL ANTIGEN
MICRO NUMBER:: 14532594
RESULT:: DETECTED — AB
SPECIMEN QUALITY: ADEQUATE

## 2022-11-26 MED ORDER — OMEPRAZOLE 40 MG PO CPDR
40.0000 mg | DELAYED_RELEASE_CAPSULE | Freq: Two times a day (BID) | ORAL | 0 refills | Status: DC
  Filled 2022-11-26: qty 28, 14d supply, fill #0

## 2022-11-26 MED ORDER — CLARITHROMYCIN 500 MG PO TABS
500.0000 mg | ORAL_TABLET | Freq: Two times a day (BID) | ORAL | 0 refills | Status: DC
  Filled 2022-11-26: qty 28, 14d supply, fill #0

## 2022-11-26 MED ORDER — METRONIDAZOLE 500 MG PO TABS
500.0000 mg | ORAL_TABLET | Freq: Three times a day (TID) | ORAL | 0 refills | Status: AC
  Filled 2022-11-26: qty 42, 14d supply, fill #0

## 2022-11-26 NOTE — Addendum Note (Signed)
Addended by: [name] on: 11/26/2022 09:52 AM   Modules accepted: Orders

## 2022-12-02 DIAGNOSIS — M25611 Stiffness of right shoulder, not elsewhere classified: Secondary | ICD-10-CM | POA: Diagnosis not present

## 2022-12-02 DIAGNOSIS — M6281 Muscle weakness (generalized): Secondary | ICD-10-CM | POA: Diagnosis not present

## 2022-12-02 DIAGNOSIS — M25511 Pain in right shoulder: Secondary | ICD-10-CM | POA: Diagnosis not present

## 2022-12-02 DIAGNOSIS — Z9889 Other specified postprocedural states: Secondary | ICD-10-CM | POA: Diagnosis not present

## 2022-12-02 DIAGNOSIS — M75121 Complete rotator cuff tear or rupture of right shoulder, not specified as traumatic: Secondary | ICD-10-CM | POA: Diagnosis not present

## 2022-12-13 ENCOUNTER — Encounter: Payer: Self-pay | Admitting: Internal Medicine

## 2022-12-15 NOTE — Progress Notes (Unsigned)
Referring Physician:  Gillis Santa, MD [place],  [place] [address]  Primary Physician:  [name], NP  History of Present Illness: 12/16/2022 [name] is here today with a chief complaint of neck pain and this in his arms.  He has been having problems for approximate 1 year.  I previously saw him in June 2023 after which she had a right shoulder surgery which she has done well from.  He still has some stiffness in his arm with movement of his shoulder, but it is much better than it was.  He has had injections in his shoulder which helped.  He had a suprascapular injection in December which helped his pain.  He also has had an epidural steroid injection which helped.  He has pain between 2 and 10 out of 10.  Laying on his back makes his hands go numb.  All 5 fingers go numb at times on the right greater than left hand.  Bowel/Bladder Dysfunction: none  Conservative measures:  Physical therapy:  yes  kc in [place] just abot three weeks ago Multimodal medical therapy including regular antiinflammatories:   acetaminophen, oxycodone, and trazodone.   Injections:  epidural steroid injections 10/04/22 Right Suprascapular Nerve Block  06/30/22 C7- T1 ESI  Past Surgery: Shoulder repair on right side 05/2022  Marc Aguilar has no symptoms of cervical myelopathy.  The symptoms are causing a significant impact on the patient's life.   I have utilized the care everywhere function in epic to review the outside records available from external health systems.  Review of Systems:  A 10 point review of systems is negative, except for the pertinent positives and negatives detailed in the HPI.  Past Medical History: Past Medical History:  Diagnosis Date   Anxiety    Arthritis    "knees and hips" (02/28/2018)   Cancer (HCC)    prostate    Chicken pox    Depression    GERD (gastroesophageal reflux disease)    Heart murmur    Hernia, abdominal    High  cholesterol    History of gout    Hypertension    Migraine    "probably monthly" (02/28/2018)   Neck pain    OSA on CPAP    Pneumonia    "once" (02/28/2018)   Refusal of blood transfusions as patient is Jehovah's Witness    Seasonal allergies    Type II diabetes mellitus ([place])     Past Surgical History: Past Surgical History:  Procedure Laterality Date   CARDIAC CATHETERIZATION  02/28/2018   LEFT HEART CATH AND CORONARY ANGIOGRAPHY N/A 02/28/2018   Procedure: LEFT HEART CATH AND CORONARY ANGIOGRAPHY;  Surgeon: Sherren Mocha, MD;  Location: [facility];  Service: Cardiovascular;  Laterality: N/A;   ROBOT ASSISTED LAPAROSCOPIC RADICAL PROSTATECTOMY N/A 11/19/2019   Procedure: XI ROBOTIC ASSISTED LAPAROSCOPIC RADICAL PROSTATECTOMY WITH LYMPH NODE DISSECTION;  Surgeon: Hollice Espy, MD;  Location: [facility];  Service: Urology;  Laterality: N/A;   SHOULDER ARTHROSCOPY WITH ROTATOR CUFF REPAIR AND SUBACROMIAL DECOMPRESSION Right 05/21/2022   Procedure: Right shoulder arthroscopic rotator cuff repair (supraspinatus & subscapularis), subacromial decompression, and biceps tenodesis;  Surgeon: Leim Fabry, MD;  Location: [facility];  Service: Orthopedics;  Laterality: Right;   WRIST GANGLION EXCISION Left 1984    Allergies: Allergies as of 12/16/2022 - Review Complete 11/23/2022  Allergen Reaction Noted   Tape Rash 05/04/2016    Medications: Current Meds  Medication Sig  acetaminophen (TYLENOL) 500 MG tablet Take 2 tablets (1,000 mg total) by mouth every 8 (eight) hours.   albuterol (VENTOLIN HFA) 108 (90 Base) MCG/ACT inhaler Inhale 2 puffs into the lungs every 6 (six) hours as needed for wheezing or shortness of breath.   Alcohol Swabs (ALCOHOL PREP) PADS 1 each by Does not apply route 2 (two) times daily.   amLODipine (NORVASC) 10 MG tablet Take 1 tablet (10 mg total) by mouth daily.   atorvastatin (LIPITOR) 20 MG tablet Take 1 tablet (20 mg total) by mouth daily. (Patient taking  differently: Take 20 mg by mouth at bedtime.)   buPROPion (WELLBUTRIN XL) 300 MG 24 hr tablet Take 1 tablet (300 mg total) by mouth daily.   citalopram (CELEXA) 40 MG tablet TAKE 1 TABLET (40 MG TOTAL) BY MOUTH DAILY.   clarithromycin (BIAXIN) 500 MG tablet Take 1 tablet (500 mg total) by mouth 2 (two) times daily.   cyclobenzaprine (FLEXERIL) 10 MG tablet Take 1 tablet (10 mg total) by mouth daily as needed for muscle spasms.   ezetimibe (ZETIA) 10 MG tablet Take 1 tablet (10 mg total) by mouth daily.   glipiZIDE (GLIPIZIDE XL) 5 MG 24 hr tablet Take 1 tablet (5 mg total) by mouth daily with breakfast.   Lancets MISC 1 each by Does not apply route in the morning and at bedtime.   metoCLOPramide (REGLAN) 5 MG tablet Take 1 tablet (5 mg total) by mouth in the morning and at bedtime.   Multiple Vitamins-Minerals (MULTIVITAMIN WITH MINERALS) tablet Take 1 tablet by mouth daily.   omeprazole (PRILOSEC) 40 MG capsule Take 1 capsule (40 mg total) by mouth 2 (two) times daily.   tadalafil (CIALIS) 20 MG tablet Take 1 tablet (20 mg total) by mouth daily as needed for erectile dysfunction.   traZODone (DESYREL) 50 MG tablet Take 1 tablet (50 mg total) by mouth at bedtime as needed for sleep.    Social History: Social History   Tobacco Use   Smoking status: Former    Packs/day: 1.00    Years: 27.00    Total pack years: 27.00    Types: Cigarettes    Quit date: 10/18/2009    Years since quitting: 13.1   Smokeless tobacco: Former    Types: Chew    Quit date: 10/18/1994  Vaping Use   Vaping Use: Never used  Substance Use Topics   Alcohol use: Yes    Alcohol/week: 12.0 standard drinks of alcohol    Types: 12 Cans of beer per week   Drug use: Never    Family Medical History: Family History  Problem Relation Age of Onset   Diabetes Mother    Hyperlipidemia Mother    Bladder Cancer Father    Liver cancer Sister    Diabetes Sister    Diabetes Maternal Aunt    Arthritis Maternal Grandmother     Diabetes Maternal Grandmother    Arthritis Maternal Grandfather    Arthritis Paternal Grandmother    Diabetes Brother    Heart disease Brother    Colon cancer Neg Hx     Physical Examination: Vitals:   12/16/22 1314  BP: (!) 150/78  Pulse: 79  SpO2: 99%    General: Patient is well developed, well nourished, calm, collected, and in no apparent distress. Attention to examination is appropriate.  Neck:   Supple.  Full range of motion with some pain on rotation to R.  Respiratory: Patient is breathing without any difficulty.   NEUROLOGICAL:  Awake, alert, oriented to person, place, and time.  Speech is clear and fluent.   Cranial Nerves: Pupils equal round and reactive to light.  Facial tone is symmetric.  Facial sensation is symmetric. Shoulder shrug is symmetric. Tongue protrusion is midline.  There is no pronator drift.  ROM of spine: full.    Strength: Side Biceps Triceps Deltoid Interossei Grip Wrist Ext. Wrist Flex.  R '5 5 5 5 5 5 5  '$ L '5 5 5 5 5 5 5   '$ Side Iliopsoas Quads Hamstring PF DF EHL  R '5 5 5 5 5 5  '$ L '5 5 5 5 5 5   '$ Reflexes are 1+ and symmetric at the biceps, triceps, brachioradialis, patella and achilles.   Hoffman's is absent.   Bilateral upper and lower extremity sensation is intact to light touch.    No evidence of dysmetria noted.  Gait is normal.     Medical Decision Making  Imaging: MRI C spine 02/16/2022 Disc levels:   Multilevel disc degeneration, greatest at C4-C5 (mild-to-moderate) and C5-C6 (moderate).   C2-C3: Trace grade 1 anterolisthesis. No significant disc herniation or stenosis.   C3-C4: Small central disc protrusion. Uncovertebral hypertrophy (predominantly on the right). The disc protrusion results in mild focal effacement of the ventral thecal sac and may contact the ventral spinal cord. Mild right neural foraminal narrowing.   C4-C5: Trace grade 1 retrolisthesis. Disc bulge. Uncovertebral hypertrophy (predominantly  on the right). The disc protrusion effaces the ventral thecal sac, mildly narrowing the spinal canal and contacting the ventral spinal cord. Moderate right neural foraminal narrowing.   C5-C6: Disc bulge with left greater than right uncovertebral hypertrophy. Mild relative spinal canal narrowing (without significant spinal cord mass effect). Bilateral neural foraminal narrowing (moderate right, moderate/severe left).   C6-C7: No significant disc herniation or stenosis.   C7-T1: Mild facet arthrosis on the left. No significant disc herniation or stenosis.   IMPRESSION: Cervical spondylosis, as outlined.   No more than mild spinal canal stenosis.   Multilevel foraminal stenosis, as detailed and greatest on the right at C4-C5 (moderate) and bilaterally at C5-C6 (moderate right, moderate/severe left).   Disc degeneration is greatest at C4-C5 (mild-to-moderate) and C5-C6 (moderate).   Nonspecific straightening of the expected cervical lordosis.     Electronically Signed   By: Kellie Simmering D.O.   On: 03/10/2022 18:57    I have personally reviewed the images and agree with the above interpretation.  Assessment and Plan: Marc Aguilar is a pleasant 62 y.o. male with neck pain and possible cervical radiculopathy.  I would like to start with physical therapy.  If he improves with this, we will continue with the management plan of physical therapy, exercises, and occasional injections.  If he does not have improvement in his pain, I will send him for a nerve conduction study to help elucidate his symptoms.  His symptoms seem more consistent with cubital tunnel syndrome or C8 nerve root related symptoms, but he does not have any compression of those nerve roots.  I will follow him up with a telephone visit in approximately 6 weeks.  I spent a total of 30 minutes in this patient's care today. This time was spent reviewing pertinent records including imaging studies, obtaining and confirming  history, performing a directed evaluation, formulating and discussing my recommendations, and documenting the visit within the medical record.    Thank you for involving me in the care of this patient.      [employer]  Vincente Poli MD, [facility] Neurosurgery

## 2022-12-16 ENCOUNTER — Ambulatory Visit: Payer: 59 | Admitting: Neurosurgery

## 2022-12-16 ENCOUNTER — Encounter: Payer: Self-pay | Admitting: Neurosurgery

## 2022-12-16 VITALS — BP 150/78 | HR 79 | Ht 71.0 in | Wt 221.6 lb

## 2022-12-16 DIAGNOSIS — M5412 Radiculopathy, cervical region: Secondary | ICD-10-CM | POA: Diagnosis not present

## 2022-12-16 DIAGNOSIS — M542 Cervicalgia: Secondary | ICD-10-CM | POA: Diagnosis not present

## 2022-12-21 ENCOUNTER — Ambulatory Visit: Payer: No Typology Code available for payment source | Admitting: Urology

## 2023-01-27 ENCOUNTER — Other Ambulatory Visit: Payer: 59

## 2023-01-27 DIAGNOSIS — C61 Malignant neoplasm of prostate: Secondary | ICD-10-CM | POA: Diagnosis not present

## 2023-01-28 LAB — PSA: Prostate Specific Ag, Serum: 0.1 ng/mL (ref 0.0–4.0)

## 2023-01-31 ENCOUNTER — Telehealth: Payer: Self-pay

## 2023-01-31 DIAGNOSIS — C61 Malignant neoplasm of prostate: Secondary | ICD-10-CM

## 2023-01-31 NOTE — Telephone Encounter (Signed)
Left message to call back  

## 2023-01-31 NOTE — Telephone Encounter (Signed)
-----   Message from Vanna Scotland, MD sent at 01/31/2023  9:03 AM EDT ----- PSA is actually detectable, 0.1.  I would like to recheck this again in 3 months.  Please schedule lab visit.  This is very important.  If it rises to 0.2, this is the definition of biochemical recurrence.  Vanna Scotland, MD

## 2023-02-02 NOTE — Telephone Encounter (Signed)
Called but voicemail is full, mychart message sent.

## 2023-02-03 NOTE — Telephone Encounter (Signed)
Called Marc Aguilar and voicemail was full, not able to leave a message. Letter mailed.

## 2023-02-08 ENCOUNTER — Ambulatory Visit (INDEPENDENT_AMBULATORY_CARE_PROVIDER_SITE_OTHER): Payer: 59 | Admitting: Neurosurgery

## 2023-02-08 ENCOUNTER — Encounter: Payer: Self-pay | Admitting: Neurosurgery

## 2023-02-08 DIAGNOSIS — Z789 Other specified health status: Secondary | ICD-10-CM

## 2023-02-08 DIAGNOSIS — M5412 Radiculopathy, cervical region: Secondary | ICD-10-CM

## 2023-02-08 DIAGNOSIS — M542 Cervicalgia: Secondary | ICD-10-CM

## 2023-02-08 NOTE — Progress Notes (Signed)
Referring Physician:  No referring provider defined for this encounter.  Primary Physician:  Lorre Munroe, NP  History of Present Illness: 02/08/2023 I attempted to call for different times without any answer.  12/16/2022 [name] is here today with a chief complaint of neck pain and this in his arms.  He has been having problems for approximate 1 year.  I previously saw him in June 2023 after which she had a right shoulder surgery which she has done well from.  He still has some stiffness in his arm with movement of his shoulder, but it is much better than it was.  He has had injections in his shoulder which helped.  He had a suprascapular injection in December which helped his pain.  He also has had an epidural steroid injection which helped.  He has pain between 2 and 10 out of 10.  Laying on his back makes his hands go numb.  All 5 fingers go numb at times on the right greater than left hand.  Bowel/Bladder Dysfunction: none  Conservative measures:  Physical therapy:  yes  kc in [place] just abot three weeks ago Multimodal medical therapy including regular antiinflammatories:   acetaminophen, oxycodone, and trazodone.   Injections:  epidural steroid injections 10/04/22 Right Suprascapular Nerve Block  06/30/22 C7- T1 ESI  Past Surgery: Shoulder repair on right side 05/2022  Marc Aguilar has no symptoms of cervical myelopathy.  The symptoms are causing a significant impact on the patient's life.   I have utilized the care everywhere function in epic to review the outside records available from external health systems.  Review of Systems:  A 10 point review of systems is negative, except for the pertinent positives and negatives detailed in the HPI.  Past Medical History: Past Medical History:  Diagnosis Date   Anxiety    Arthritis    "knees and hips" (02/28/2018)   Cancer (HCC)    prostate    Chicken pox    Depression    GERD (gastroesophageal reflux  disease)    Heart murmur    Hernia, abdominal    High cholesterol    History of gout    Hypertension    Migraine    "probably monthly" (02/28/2018)   Neck pain    OSA on CPAP    Pneumonia    "once" (02/28/2018)   Refusal of blood transfusions as patient is Jehovah's Witness    Seasonal allergies    Type II diabetes mellitus (HCC)     Past Surgical History: Past Surgical History:  Procedure Laterality Date   CARDIAC CATHETERIZATION  02/28/2018   LEFT HEART CATH AND CORONARY ANGIOGRAPHY N/A 02/28/2018   Procedure: LEFT HEART CATH AND CORONARY ANGIOGRAPHY;  Surgeon: Tonny Bollman, MD;  [facility];  Service: Cardiovascular;  Laterality: N/A;   ROBOT ASSISTED LAPAROSCOPIC RADICAL PROSTATECTOMY N/A 11/19/2019   Procedure: XI ROBOTIC ASSISTED LAPAROSCOPIC RADICAL PROSTATECTOMY WITH LYMPH NODE DISSECTION;  Surgeon: Vanna Scotland, MD;  Location: [facility];  Service: Urology;  Laterality: N/A;   SHOULDER ARTHROSCOPY WITH ROTATOR CUFF REPAIR AND SUBACROMIAL DECOMPRESSION Right 05/21/2022   Procedure: Right shoulder arthroscopic rotator cuff repair (supraspinatus & subscapularis), subacromial decompression, and biceps tenodesis;  Surgeon: Signa Kell, MD;  Location: [facility] OR[facility];  Service: Orthopedics;  Laterality: Right;   WRIST GANGLION EXCISION Left 1984    Allergies: Allergies as of 02/08/2023 - Review Complete 11/23/2022  Allergen Reaction Noted   Tape Rash 05/04/2016  Medications: No outpatient medications have been marked as taking for the 02/08/23 encounter (Appointment) with Venetia Night, MD.    Social History: Social History   Tobacco Use   Smoking status: Former    Packs/day: 1.00    Years: 27.00    Additional pack years: 0.00    Total pack years: 27.00    Types: Cigarettes    Quit date: 10/18/2009    Years since quitting: 13.3   Smokeless tobacco: Former    Types: Chew    Quit date: 10/18/1994  Vaping Use   Vaping Use: Never used  Substance Use  Topics   Alcohol use: Yes    Alcohol/week: 12.0 standard drinks of alcohol    Types: 12 Cans of beer per week   Drug use: Never    Family Medical History: Family History  Problem Relation Age of Onset   Diabetes Mother    Hyperlipidemia Mother    Bladder Cancer Father    Liver cancer Sister    Diabetes Sister    Diabetes Maternal Aunt    Arthritis Maternal Grandmother    Diabetes Maternal Grandmother    Arthritis Maternal Grandfather    Arthritis Paternal Grandmother    Diabetes Brother    Heart disease Brother    Colon cancer Neg Hx     Physical Examination: No exam today-telephone visit  Medical Decision Making  Imaging: MRI C spine 02/16/2022 osteoporosis of 8:12 AM: Prior total visit cardiovascular Disc levels:   Multilevel disc degeneration, greatest at C4-C5 (mild-to-moderate) and C5-C6 (moderate).   C2-C3: Trace grade 1 anterolisthesis. No significant disc herniation or stenosis.   C3-C4: Small central disc protrusion. Uncovertebral hypertrophy (predominantly on the right). The disc protrusion results in mild focal effacement of the ventral thecal sac and may contact the ventral spinal cord. Mild right neural foraminal narrowing.   C4-C5: Trace grade 1 retrolisthesis. Disc bulge. Uncovertebral hypertrophy (predominantly on the right). The disc protrusion effaces the ventral thecal sac, mildly narrowing the spinal canal and contacting the ventral spinal cord. Moderate right neural foraminal narrowing.   C5-C6: Disc bulge with left greater than right uncovertebral hypertrophy. Mild relative spinal canal narrowing (without significant spinal cord mass effect). Bilateral neural foraminal narrowing (moderate right, moderate/severe left).   C6-C7: No significant disc herniation or stenosis.   C7-T1: Mild facet arthrosis on the left. No significant disc herniation or stenosis.   IMPRESSION: Cervical spondylosis, as outlined.   No more than mild spinal  canal stenosis.   Multilevel foraminal stenosis, as detailed and greatest on the right at C4-C5 (moderate) and bilaterally at C5-C6 (moderate right, moderate/severe left).   Disc degeneration is greatest at C4-C5 (mild-to-moderate) and C5-C6 (moderate).   Nonspecific straightening of the expected cervical lordosis.     Electronically Signed   By: Jackey Loge D.O.   On: 03/10/2022 18:57    I have personally reviewed the images and agree with the above interpretation.  Assessment and Plan: Marc Aguilar is a pleasant 62 y.o. male with neck pain and possible cervical radiculopathy.  I would like to start with physical therapy.  If he improves with this, we will continue with the management plan of physical therapy, exercises, and occasional injections.  If he does not have improvement in his pain, I will send him for a nerve conduction study to help elucidate his symptoms.  His symptoms seem more consistent with cubital tunnel syndrome or C8 nerve root related symptoms, but he does not have any compression of  those nerve roots.  I will follow him up with a telephone visit in approximately 6 weeks.  I spent a total of 30 minutes in this patient's care today. This time was spent reviewing pertinent records including imaging studies, obtaining and confirming history, performing a directed evaluation, formulating and discussing my recommendations, and documenting the visit within the medical record.    Thank you for involving me in the care of this patient.      Marc Aguilar K. Myer Haff MD, [facility] Neurosurgery

## 2023-02-09 NOTE — Addendum Note (Signed)
Addended by: Consuella Lose on: 02/09/2023 01:26 PM   Modules accepted: Orders

## 2023-02-09 NOTE — Telephone Encounter (Signed)
Noted, orders placed. 

## 2023-02-09 NOTE — Telephone Encounter (Signed)
Pt returned call and I scheduled 3 month lab appt

## 2023-02-17 ENCOUNTER — Encounter: Payer: Self-pay | Admitting: Gastroenterology

## 2023-02-17 ENCOUNTER — Ambulatory Visit: Payer: 59 | Admitting: Gastroenterology

## 2023-02-17 ENCOUNTER — Other Ambulatory Visit: Payer: Self-pay

## 2023-02-17 VITALS — BP 141/98 | HR 86 | Temp 98.5°F | Ht 70.0 in | Wt 219.0 lb

## 2023-02-17 DIAGNOSIS — R1013 Epigastric pain: Secondary | ICD-10-CM

## 2023-02-17 DIAGNOSIS — R6881 Early satiety: Secondary | ICD-10-CM

## 2023-02-17 DIAGNOSIS — K219 Gastro-esophageal reflux disease without esophagitis: Secondary | ICD-10-CM | POA: Diagnosis not present

## 2023-02-17 MED ORDER — PANTOPRAZOLE SODIUM 40 MG PO TBEC
40.0000 mg | DELAYED_RELEASE_TABLET | Freq: Two times a day (BID) | ORAL | 5 refills | Status: DC
  Filled 2023-02-17: qty 60, 30d supply, fill #0
  Filled 2023-06-18 – 2023-07-08 (×2): qty 60, 30d supply, fill #1
  Filled 2023-09-19: qty 60, 30d supply, fill #2
  Filled 2023-12-17: qty 60, 30d supply, fill #3

## 2023-02-17 NOTE — Progress Notes (Signed)
Gastroenterology Consultation  Referring Provider:     Lorre Munroe, NP Primary Care Physician:  Lorre Munroe, NP Primary Gastroenterologist:  Dr. Servando Snare     Reason for Consultation:     Chronic nausea and abdominal pain        HPI:   Marc Aguilar is a 62 y.o. y/o male referred for consultation & management of chronic nausea and abdominal pain by Dr. Sampson Si, Salvadore Oxford, NP.  This patient comes to see me after being seen by Dr. Christella Hartigan in [place] many years ago.  The patient was sent to me for chronic nausea with abdominal pain and the question was whether the patient had gastroparesis and a stool antigen test showed the patient to be positive for and the stool antigen test was shown to be positive for H. pylori.  There was a question as to whether alcoholic gastritis could also be the cause and he was referred for GI evaluation and possible upper endoscopy. He was treated and says he feels full fast. Lost 10 lbs in last few months. The patient reports that he feels like he gets full very fast after eating.  He also has epigastric pain but is not sure if it is related to his abdominal wall hernia.  He does report that his nausea did get better after he was treated for his H. pylori but denies being checked again for eradication for the H. pylori.  He is presently taking a PPI twice a day and reports it to be omeprazole.  Past Medical History:  Diagnosis Date   Anxiety    Arthritis    "knees and hips" (02/28/2018)   Cancer (HCC)    prostate    Chicken pox    Depression    GERD (gastroesophageal reflux disease)    Heart murmur    Hernia, abdominal    High cholesterol    History of gout    Hypertension    Migraine    "probably monthly" (02/28/2018)   Neck pain    OSA on CPAP    Pneumonia    "once" (02/28/2018)   Refusal of blood transfusions as patient is Jehovah's Witness    Seasonal allergies    Type II diabetes mellitus (HCC)     Past Surgical History:  Procedure  Laterality Date   CARDIAC CATHETERIZATION  02/28/2018   LEFT HEART CATH AND CORONARY ANGIOGRAPHY N/A 02/28/2018   Procedure: LEFT HEART CATH AND CORONARY ANGIOGRAPHY;  Surgeon: Tonny Bollman, MD;  Location: [facility] INVASIVE CV LAB;  Service: Cardiovascular;  Laterality: N/A;   ROBOT ASSISTED LAPAROSCOPIC RADICAL PROSTATECTOMY N/A 11/19/2019   Procedure: XI ROBOTIC ASSISTED LAPAROSCOPIC RADICAL PROSTATECTOMY WITH LYMPH NODE DISSECTION;  Surgeon: Vanna Scotland, MD;  Location: [facility] ORS;  Service: Urology;  Laterality: N/A;   SHOULDER ARTHROSCOPY WITH ROTATOR CUFF REPAIR AND SUBACROMIAL DECOMPRESSION Right 05/21/2022   Procedure: Right shoulder arthroscopic rotator cuff repair (supraspinatus & subscapularis), subacromial decompression, and biceps tenodesis;  Surgeon: Signa Kell, MD;  Location: [facility] ORS;  Service: Orthopedics;  Laterality: Right;   WRIST GANGLION EXCISION Left 1984    Prior to Admission medications   Medication Sig Start Date End Date Taking? Authorizing Provider  acetaminophen (TYLENOL) 500 MG tablet Take 2 tablets (1,000 mg total) by mouth every 8 (eight) hours. 05/21/22 05/21/23  Signa Kell, MD  albuterol (VENTOLIN HFA) 108 (90 Base) MCG/ACT inhaler Inhale 2 puffs into the lungs every 6 (six) hours as needed for wheezing or shortness of breath.  03/26/19   Lorre Munroe, NP  Alcohol Swabs (ALCOHOL PREP) PADS 1 each by Does not apply route 2 (two) times daily. 02/01/17   Lorre Munroe, NP  amLODipine (NORVASC) 10 MG tablet Take 1 tablet (10 mg total) by mouth daily. 03/18/22   Lorre Munroe, NP  atorvastatin (LIPITOR) 20 MG tablet Take 1 tablet (20 mg total) by mouth daily. Patient taking differently: Take 20 mg by mouth at bedtime. 05/13/21   Eulis Foster, FNP  buPROPion (WELLBUTRIN XL) 300 MG 24 hr tablet Take 1 tablet (300 mg total) by mouth daily. 06/15/22   Lorre Munroe, NP  citalopram (CELEXA) 40 MG tablet TAKE 1 TABLET (40 MG TOTAL) BY MOUTH DAILY. 11/23/22   Lorre Munroe, NP   clarithromycin (BIAXIN) 500 MG tablet Take 1 tablet (500 mg total) by mouth 2 (two) times daily. 11/26/22   Lorre Munroe, NP  cyclobenzaprine (FLEXERIL) 10 MG tablet Take 1 tablet (10 mg total) by mouth daily as needed for muscle spasms. 06/15/22   Lorre Munroe, NP  ezetimibe (ZETIA) 10 MG tablet Take 1 tablet (10 mg total) by mouth daily. 06/15/22   Lorre Munroe, NP  glipiZIDE (GLIPIZIDE XL) 5 MG 24 hr tablet Take 1 tablet (5 mg total) by mouth daily with breakfast. 10/05/22   Lorre Munroe, NP  Lancets MISC 1 each by Does not apply route in the morning and at bedtime. 02/06/20   Lorre Munroe, NP  metoCLOPramide (REGLAN) 5 MG tablet Take 1 tablet (5 mg total) by mouth in the morning and at bedtime. 11/23/22   Lorre Munroe, NP  Multiple Vitamins-Minerals (MULTIVITAMIN WITH MINERALS) tablet Take 1 tablet by mouth daily.    [provider]  omeprazole (PRILOSEC) 40 MG capsule Take 1 capsule (40 mg total) by mouth 2 (two) times daily. 11/26/22   Lorre Munroe, NP  tadalafil (CIALIS) 20 MG tablet Take 1 tablet (20 mg total) by mouth daily as needed for erectile dysfunction. 07/27/22   Vanna Scotland, MD  traZODone (DESYREL) 50 MG tablet Take 1 tablet (50 mg total) by mouth at bedtime as needed for sleep. 10/05/22   Lorre Munroe, NP    Family History  Problem Relation Age of Onset   Diabetes Mother    Hyperlipidemia Mother    Bladder Cancer Father    Liver cancer Sister    Diabetes Sister    Diabetes Maternal Aunt    Arthritis Maternal Grandmother    Diabetes Maternal Grandmother    Arthritis Maternal Grandfather    Arthritis Paternal Grandmother    Diabetes Brother    Heart disease Brother    Colon cancer Neg Hx      Social History   Tobacco Use   Smoking status: Former    Packs/day: 1.00    Years: 27.00    Additional pack years: 0.00    Total pack years: 27.00    Types: Cigarettes    Quit date: 10/18/2009    Years since quitting: 13.3   Smokeless tobacco:  Former    Types: Chew    Quit date: 10/18/1994  Vaping Use   Vaping Use: Never used  Substance Use Topics   Alcohol use: Yes    Alcohol/week: 12.0 standard drinks of alcohol    Types: 12 Cans of beer per week   Drug use: Never    Allergies as of 02/17/2023 - Review Complete 11/23/2022  Allergen Reaction Noted  Tape Rash 05/04/2016    Review of Systems:    All systems reviewed and negative except where noted in HPI.   Physical Exam:  There were no vitals taken for this visit. No LMP for male patient. General:   Alert,  Well-developed, well-nourished, pleasant and cooperative in NAD Head:  Normocephalic and atraumatic. Eyes:  Sclera clear, no icterus.   Conjunctiva pink. Ears:  Normal auditory acuity. Neck:  Supple; no masses or thyromegaly. Lungs:  Respirations even and unlabored.  Clear throughout to auscultation.   No wheezes, crackles, or rhonchi. No acute distress. Heart:  Regular rate and rhythm; no murmurs, clicks, rubs, or gallops. Abdomen:  Normal bowel sounds.  No bruits.  Soft, positive tenderness to 1 finger palpation while flexing the abdominal wall muscles and non-distended without masses, hepatosplenomegaly or hernias noted.  No guarding or rebound tenderness.  Negative Carnett sign.   Rectal:  Deferred.  Pulses:  Normal pulses noted. Extremities:  No clubbing or edema.  No cyanosis. Neurologic:  Alert and oriented x3;  grossly normal neurologically. Skin:  Intact without significant lesions or rashes.  No jaundice. Lymph Nodes:  No significant cervical adenopathy. Psych:  Alert and cooperative. Normal mood and affect.  Imaging Studies: No results found.  Assessment and Plan:   JIAIRE ROSEBROOK is a 62 y.o. y/o male who comes in today with epigastric tenderness that is reproducible with 1 finger palpation of the abdominal wall muscles consistent with musculoskeletal pain.  The patient has early satiety and nausea with reflux symptoms despite taking a PPI.  The  patient will be switched to pantoprazole 40 mg twice a day and he will be also set up for an upper endoscopy due to his early satiety.  The patient will also have his stools checked for H. pylori to document eradication of his infection.  The patient has been explained the plan and agrees with it.    [name], MD. Clementeen Graham    Note: This dictation was prepared with Dragon dictation along with smaller phrase technology. Any transcriptional errors that result from this process are unintentional.

## 2023-02-18 NOTE — Addendum Note (Signed)
Addended by: Roena Malady on: 02/18/2023 11:43 AM   Modules accepted: Orders

## 2023-02-21 ENCOUNTER — Encounter: Payer: Self-pay | Admitting: Gastroenterology

## 2023-02-21 LAB — H. PYLORI ANTIGEN, STOOL: H pylori Ag, Stl: NEGATIVE

## 2023-03-09 ENCOUNTER — Telehealth: Payer: Self-pay

## 2023-03-09 DIAGNOSIS — M542 Cervicalgia: Secondary | ICD-10-CM

## 2023-03-09 DIAGNOSIS — M5412 Radiculopathy, cervical region: Secondary | ICD-10-CM

## 2023-03-09 NOTE — Telephone Encounter (Signed)
-----   Message from Venetia Night, MD sent at 03/09/2023  4:05 PM EDT ----- Ok to send to [facility] for PT for same reason as prior referral

## 2023-03-09 NOTE — Telephone Encounter (Signed)
New referral placed.

## 2023-03-10 ENCOUNTER — Encounter: Payer: Self-pay | Admitting: Gastroenterology

## 2023-03-10 NOTE — Anesthesia Preprocedure Evaluation (Addendum)
Anesthesia Evaluation  Patient identified by MRN, date of birth, ID band Patient awake    Reviewed: Allergy & Precautions, H&P , NPO status , Patient's Chart, lab work & pertinent test results  Airway Mallampati: I  TM Distance: >3 FB Neck ROM: Full    Dental no notable dental hx.    Pulmonary sleep apnea and Continuous Positive Airway Pressure Ventilation , pneumonia, former smoker   Pulmonary exam normal breath sounds clear to auscultation       Cardiovascular hypertension, negative cardio ROS Normal cardiovascular exam+ Valvular Problems/Murmurs  Rhythm:Regular Rate:Normal  Echo 03-01-18 EF 65-70%   Neuro/Psych  Headaches PSYCHIATRIC DISORDERS Anxiety Depression     Neuromuscular disease  negative psych ROS   GI/Hepatic negative GI ROS, Neg liver ROS,GERD  ,,  Endo/Other  diabetes    Renal/GU negative Renal ROS  negative genitourinary   Musculoskeletal  (+) Arthritis ,    Abdominal   Peds negative pediatric ROS (+)  Hematology negative hematology ROS (+)   Anesthesia Other Findings GERD (gastroesophageal reflux disease) Heart murmur Refusal of blood transfusions as patient is Jehovah's Witness  High cholesterol Seasonal allergies  Pneumonia OSA on CPAP  Type II diabetes mellitus  Migraine  History of gout Arthritis  Cancer (HCC) Anxiety  Depression Neck pain  Hypertension Motion sickness     Reproductive/Obstetrics negative OB ROS                             Anesthesia Physical Anesthesia Plan  ASA: 3  Anesthesia Plan: General   Post-op Pain Management:    Induction: Intravenous  PONV Risk Score and Plan:   Airway Management Planned: Natural Airway and Nasal Cannula  Additional Equipment:   Intra-op Plan:   Post-operative Plan:   Informed Consent: I have reviewed the patients History and Physical, chart, labs and discussed the procedure including the risks,  benefits and alternatives for the proposed anesthesia with the patient or authorized representative who has indicated his/her understanding and acceptance.     Dental Advisory Given  Plan Discussed with: Anesthesiologist, CRNA and Surgeon  Anesthesia Plan Comments: (Patient consented for risks of anesthesia including but not limited to:  - adverse reactions to medications - risk of airway placement if required - damage to eyes, teeth, lips or other oral mucosa - nerve damage due to positioning  - sore throat or hoarseness - Damage to heart, brain, nerves, lungs, other parts of body or loss of life  Patient voiced understanding.)        Anesthesia Quick Evaluation

## 2023-03-17 ENCOUNTER — Encounter: Payer: Self-pay | Admitting: Gastroenterology

## 2023-03-17 ENCOUNTER — Encounter
Admission: RE | Disposition: A | Discharge status: Home / Self Care | Payer: Self-pay | Source: Home / Self Care | Attending: Gastroenterology

## 2023-03-17 ENCOUNTER — Ambulatory Visit: Payer: 59 | Admitting: Anesthesiology

## 2023-03-17 ENCOUNTER — Other Ambulatory Visit: Payer: Self-pay

## 2023-03-17 ENCOUNTER — Ambulatory Visit
Admission: RE | Admit: 2023-03-17 | Discharge: 2023-03-17 | Disposition: A | Discharge status: Home / Self Care | Payer: 59 | Attending: Gastroenterology | Admitting: Gastroenterology

## 2023-03-17 DIAGNOSIS — Z87891 Personal history of nicotine dependence: Secondary | ICD-10-CM | POA: Insufficient documentation

## 2023-03-17 DIAGNOSIS — K2970 Gastritis, unspecified, without bleeding: Secondary | ICD-10-CM | POA: Diagnosis not present

## 2023-03-17 DIAGNOSIS — K219 Gastro-esophageal reflux disease without esophagitis: Secondary | ICD-10-CM | POA: Diagnosis not present

## 2023-03-17 DIAGNOSIS — I10 Essential (primary) hypertension: Secondary | ICD-10-CM | POA: Insufficient documentation

## 2023-03-17 DIAGNOSIS — F419 Anxiety disorder, unspecified: Secondary | ICD-10-CM | POA: Diagnosis not present

## 2023-03-17 DIAGNOSIS — K2950 Unspecified chronic gastritis without bleeding: Secondary | ICD-10-CM | POA: Insufficient documentation

## 2023-03-17 DIAGNOSIS — E119 Type 2 diabetes mellitus without complications: Secondary | ICD-10-CM | POA: Diagnosis not present

## 2023-03-17 DIAGNOSIS — R6881 Early satiety: Secondary | ICD-10-CM

## 2023-03-17 DIAGNOSIS — K31A0 Gastric intestinal metaplasia, unspecified: Secondary | ICD-10-CM | POA: Diagnosis not present

## 2023-03-17 DIAGNOSIS — F32A Depression, unspecified: Secondary | ICD-10-CM | POA: Diagnosis not present

## 2023-03-17 DIAGNOSIS — G4733 Obstructive sleep apnea (adult) (pediatric): Secondary | ICD-10-CM | POA: Diagnosis not present

## 2023-03-17 HISTORY — PX: ESOPHAGOGASTRODUODENOSCOPY (EGD) WITH PROPOFOL: SHX5813

## 2023-03-17 HISTORY — DX: Motion sickness, initial encounter: T75.3XXA

## 2023-03-17 LAB — GLUCOSE, CAPILLARY: Glucose-Capillary: 163 mg/dL — ABNORMAL HIGH (ref 70–99)

## 2023-03-17 SURGERY — ESOPHAGOGASTRODUODENOSCOPY (EGD) WITH PROPOFOL
Anesthesia: General

## 2023-03-17 MED ORDER — SODIUM CHLORIDE 0.9 % IV SOLN: INTRAVENOUS | Status: DC

## 2023-03-17 MED ORDER — STERILE WATER FOR IRRIGATION IR SOLN
Status: DC | PRN
  Administered 2023-03-17: 1000 mL

## 2023-03-17 MED ORDER — PROPOFOL 10 MG/ML IV BOLUS
INTRAVENOUS | Status: DC | PRN
  Administered 2023-03-17 (×2): 80 mg via INTRAVENOUS

## 2023-03-17 MED ORDER — LACTATED RINGERS IV SOLN: INTRAVENOUS | Status: DC

## 2023-03-17 MED ORDER — LIDOCAINE HCL (CARDIAC) PF 100 MG/5ML IV SOSY
PREFILLED_SYRINGE | INTRAVENOUS | Status: DC | PRN
  Administered 2023-03-17: 30 mg via INTRAVENOUS

## 2023-03-17 SURGICAL SUPPLY — 10 items
BLOCK BITE 60FR ADLT L/F GRN (MISCELLANEOUS) ×1 IMPLANT
FCP ESCP3.2XJMB 240X2.8X (MISCELLANEOUS) ×1
FORCEPS BIOP RJ4 240 W/NDL (MISCELLANEOUS) ×1
FORCEPS ESCP3.2XJMB 240X2.8X (MISCELLANEOUS) IMPLANT
GOWN CVR UNV OPN BCK APRN NK (MISCELLANEOUS) ×2 IMPLANT
GOWN ISOL THUMB LOOP REG UNIV (MISCELLANEOUS) ×2
KIT PRC NS LF DISP ENDO (KITS) ×1 IMPLANT
KIT PROCEDURE OLYMPUS (KITS) ×1
MANIFOLD NEPTUNE II (INSTRUMENTS) ×1 IMPLANT
WATER STERILE IRR 250ML POUR (IV SOLUTION) ×1 IMPLANT

## 2023-03-17 NOTE — Op Note (Signed)
[facility] Gastroenterology Patient Name: Marc Aguilar Procedure Date: 03/17/2023 7:48 AM MRN: [id number] Account #: [id number] Date of Birth: 12/24/1960 Admit Type: Outpatient Age: 62 Room: [facility] OR ROOM 01 Gender: Male Note Status: Finalized Instrument Name: [id number] Procedure:             Upper GI endoscopy Indications:           Early satiety Providers:             [name] MD, MD Referring MD:          Lorre Munroe (Referring MD) Medicines:             Propofol per Anesthesia Complications:         No immediate complications. Procedure:             Pre-Anesthesia Assessment:                        - Prior to the procedure, a History and Physical was                         performed, and patient medications and allergies were                         reviewed. The patient's tolerance of previous                         anesthesia was also reviewed. The risks and benefits                         of the procedure and the sedation options and risks                         were discussed with the patient. All questions were                         answered, and informed consent was obtained. Prior                         Anticoagulants: The patient has taken no anticoagulant                         or antiplatelet agents. ASA Grade Assessment: II - A                         patient with mild systemic disease. After reviewing                         the risks and benefits, the patient was deemed in                         satisfactory condition to undergo the procedure.                        After obtaining informed consent, the endoscope was                         passed under direct vision. Throughout the procedure,  the patient's blood pressure, pulse, and oxygen                         saturations were monitored continuously. The Endoscope                         was introduced through the mouth, and advanced to the                          second part of duodenum. The upper GI endoscopy was                         accomplished without difficulty. The patient tolerated                         the procedure well. Findings:      The Z-line was irregular and was found at the gastroesophageal junction.      Localized minimal inflammation characterized by erythema was found in       the gastric antrum. Biopsies were taken with a cold forceps for       histology.      The examined duodenum was normal. Impression:            - Z-line irregular, at the gastroesophageal junction.                        - Gastritis. Biopsied.                        - Normal examined duodenum. Recommendation:        - Discharge patient to home.                        - Resume previous diet.                        - Continue present medications.                        - Await pathology results. Procedure Code(s):     --- Professional ---                        [phone], Esophagogastroduodenoscopy, flexible,                         transoral; with biopsy, single or multiple Diagnosis Code(s):     --- Professional ---                        R68.81, Early satiety                        K29.70, Gastritis, unspecified, without bleeding CPT copyright 2022 American Medical Association. All rights reserved. The codes documented in this report are preliminary and upon coder review may  be revised to meet current compliance requirements. [name] MD, MD 03/17/2023 7:59:44 AM This report has been signed electronically. Number of Addenda: 0 Note Initiated On: 03/17/2023 7:48 AM Total Procedure Duration: 0 hours 3 minutes 2 seconds  Estimated Blood Loss:  Estimated blood loss: none.      [facility]

## 2023-03-17 NOTE — Anesthesia Postprocedure Evaluation (Signed)
Anesthesia Post Note  Patient: Marc Aguilar  Procedure(s) Performed: ESOPHAGOGASTRODUODENOSCOPY (EGD) WITH PROPOFOL  Patient location during evaluation: PACU Anesthesia Type: General Level of consciousness: awake and alert Pain management: pain level controlled Vital Signs Assessment: post-procedure vital signs reviewed and stable Respiratory status: spontaneous breathing, nonlabored ventilation, respiratory function stable and patient connected to nasal cannula oxygen Cardiovascular status: blood pressure returned to baseline and stable Postop Assessment: no apparent nausea or vomiting Anesthetic complications: no   No notable events documented.   Last Vitals:  Vitals:   03/17/23 0802 03/17/23 0809  BP: 109/82 130/87  Pulse: 86 87  Resp: 14 (!) 21  Temp: 36.4 C 36.4 C  SpO2: 93% 97%    Last Pain:  Vitals:   03/17/23 0809  TempSrc:   PainSc: 0-No pain                 Jakarius Flamenco C Enmanuel Zufall

## 2023-03-17 NOTE — H&P (Signed)
Marc Minium, MD [facility] [address], [address] [place], [place] [address] Phone:[phone] Fax : [phone]  Primary Care Physician:  Lorre Munroe, NP Primary Gastroenterologist:  Dr. Servando Snare  Pre-Procedure History & Physical: HPI:  Marc Aguilar is a 62 y.o. male is here for an endoscopy.   Past Medical History:  Diagnosis Date   Anxiety    Arthritis    "knees and hips" (02/28/2018)   Cancer (HCC)    prostate    Chicken pox    Depression    GERD (gastroesophageal reflux disease)    Heart murmur    Hernia, abdominal    High cholesterol    History of gout    Hypertension    Migraine    "probably monthly" (02/28/2018)   Motion sickness    ocean boats, circular motion   Neck pain    OSA on CPAP    Pneumonia    "once" (02/28/2018)   Refusal of blood transfusions as patient is Jehovah's Witness    Seasonal allergies    Type II diabetes mellitus (HCC)     Past Surgical History:  Procedure Laterality Date   CARDIAC CATHETERIZATION  02/28/2018   LEFT HEART CATH AND CORONARY ANGIOGRAPHY N/A 02/28/2018   Procedure: LEFT HEART CATH AND CORONARY ANGIOGRAPHY;  Surgeon: Tonny Bollman, MD;  Location: [facility] INVASIVE CV LAB;  Service: Cardiovascular;  Laterality: N/A;   ROBOT ASSISTED LAPAROSCOPIC RADICAL PROSTATECTOMY N/A 11/19/2019   Procedure: XI ROBOTIC ASSISTED LAPAROSCOPIC RADICAL PROSTATECTOMY WITH LYMPH NODE DISSECTION;  Surgeon: Vanna Scotland, MD;  Location: [facility] ORS;  Service: Urology;  Laterality: N/A;   SHOULDER ARTHROSCOPY WITH ROTATOR CUFF REPAIR AND SUBACROMIAL DECOMPRESSION Right 05/21/2022   Procedure: Right shoulder arthroscopic rotator cuff repair (supraspinatus & subscapularis), subacromial decompression, and biceps tenodesis;  Surgeon: Signa Kell, MD;  Location: [facility] ORS;  Service: Orthopedics;  Laterality: Right;   WRIST GANGLION EXCISION Left 1984    Prior to Admission medications   Medication Sig Start Date End Date Taking? Authorizing Provider   acetaminophen (TYLENOL) 500 MG tablet Take 2 tablets (1,000 mg total) by mouth every 8 (eight) hours. 05/21/22 05/21/23 Yes Signa Kell, MD  albuterol (VENTOLIN HFA) 108 (90 Base) MCG/ACT inhaler Inhale 2 puffs into the lungs every 6 (six) hours as needed for wheezing or shortness of breath. 03/26/19  Yes Lorre Munroe, NP  amLODipine (NORVASC) 10 MG tablet Take 1 tablet (10 mg total) by mouth daily. 03/18/22  Yes Baity, Salvadore Oxford, NP  atorvastatin (LIPITOR) 20 MG tablet Take 1 tablet (20 mg total) by mouth daily. Patient taking differently: Take 20 mg by mouth at bedtime. 05/13/21  Yes Worthy Rancher B, FNP  buPROPion (WELLBUTRIN XL) 300 MG 24 hr tablet Take 1 tablet (300 mg total) by mouth daily. 06/15/22  Yes Baity, Salvadore Oxford, NP  citalopram (CELEXA) 40 MG tablet TAKE 1 TABLET (40 MG TOTAL) BY MOUTH DAILY. 11/23/22  Yes Lorre Munroe, NP  ezetimibe (ZETIA) 10 MG tablet Take 1 tablet (10 mg total) by mouth daily. 06/15/22  Yes Lorre Munroe, NP  glipiZIDE (GLIPIZIDE XL) 5 MG 24 hr tablet Take 1 tablet (5 mg total) by mouth daily with breakfast. 10/05/22  Yes Baity, Salvadore Oxford, NP  pantoprazole (PROTONIX) 40 MG tablet Take 1 tablet (40 mg total) by mouth 2 (two) times daily before a meal. 02/17/23  Yes Marc Minium, MD  tadalafil (CIALIS) 20 MG tablet Take 1 tablet (20 mg total) by mouth daily as needed for  erectile dysfunction. 07/27/22  Yes Vanna Scotland, MD  traZODone (DESYREL) 50 MG tablet Take 1 tablet (50 mg total) by mouth at bedtime as needed for sleep. 10/05/22  Yes Baity, Salvadore Oxford, NP  Alcohol Swabs (ALCOHOL PREP) PADS 1 each by Does not apply route 2 (two) times daily. 02/01/17   Lorre Munroe, NP  Lancets MISC 1 each by Does not apply route in the morning and at bedtime. 02/06/20   Lorre Munroe, NP  metoCLOPramide (REGLAN) 5 MG tablet Take 1 tablet (5 mg total) by mouth in the morning and at bedtime. Patient not taking: Reported on 03/10/2023 11/23/22   Lorre Munroe, NP  Multiple  Vitamins-Minerals (MULTIVITAMIN WITH MINERALS) tablet Take 1 tablet by mouth daily. Patient not taking: Reported on 03/10/2023    [provider]    Allergies as of 02/18/2023 - Review Complete 02/17/2023  Allergen Reaction Noted   Tape Rash 05/04/2016    Family History  Problem Relation Age of Onset   Diabetes Mother    Hyperlipidemia Mother    Bladder Cancer Father    Liver cancer Sister    Diabetes Sister    Diabetes Maternal Aunt    Arthritis Maternal Grandmother    Diabetes Maternal Grandmother    Arthritis Maternal Grandfather    Arthritis Paternal Grandmother    Diabetes Brother    Heart disease Brother    Colon cancer Neg Hx     Social History   Socioeconomic History   Marital status: Married    Spouse name: Not on file   Number of children: 2   Years of education: Not on file   Highest education level: Not on file  Occupational History   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  Tobacco Use   Smoking status: Former    Packs/day: 1.00    Years: 27.00    Additional pack years: 0.00    Total pack years: 27.00    Types: Cigarettes    Quit date: 10/18/2009    Years since quitting: 13.4   Smokeless tobacco: Former    Types: Chew    Quit date: 10/18/1994  Vaping Use   Vaping Use: Never used  Substance and Sexual Activity   Alcohol use: Yes    Alcohol/week: 21.0 standard drinks of alcohol    Types: 21 Cans of beer per week   Drug use: Never   Sexual activity: Not on file  Other Topics Concern   Not on file  Social History Narrative   Not on file   Social Determinants of Health   Financial Resource Strain: Not on file  Food Insecurity: Not on file  Transportation Needs: Not on file  Physical Activity: Not on file  Stress: Not on file  Social Connections: Not on file  Intimate Partner Violence: Not on file    Review of Systems: See HPI, otherwise negative ROS  Physical Exam: Ht 5\' 10"  (1.778 m)   Wt 99.8 kg   BMI 31.57 kg/m  General:   Alert,  pleasant  and cooperative in NAD Head:  Normocephalic and atraumatic. Neck:  Supple; no masses or thyromegaly. Lungs:  Clear throughout to auscultation.    Heart:  Regular rate and rhythm. Abdomen:  Soft, nontender and nondistended. Normal bowel sounds, without guarding, and without rebound.   Neurologic:  Alert and  oriented x4;  grossly normal neurologically.  Impression/Plan: Marc Aguilar is here for an endoscopy to be performed for early satiety.   Risks, benefits, limitations,  and alternatives regarding  endoscopy have been reviewed with the patient.  Questions have been answered.  All parties agreeable.   Marc Minium, MD  03/17/2023, 7:12 AM

## 2023-03-17 NOTE — Transfer of Care (Signed)
Immediate Anesthesia Transfer of Care Note  Patient: Marc Aguilar  Procedure(s) Performed: ESOPHAGOGASTRODUODENOSCOPY (EGD) WITH PROPOFOL  Patient Location: PACU  Anesthesia Type: General  Level of Consciousness: awake, alert  and patient cooperative  Airway and Oxygen Therapy: Patient Spontanous Breathing and Patient connected to supplemental oxygen  Post-op Assessment: Post-op Vital signs reviewed, Patient's Cardiovascular Status Stable, Respiratory Function Stable, Patent Airway and No signs of Nausea or vomiting  Post-op Vital Signs: Reviewed and stable  Complications: No notable events documented.

## 2023-03-18 ENCOUNTER — Encounter: Payer: Self-pay | Admitting: Gastroenterology

## 2023-03-25 ENCOUNTER — Ambulatory Visit: Payer: Self-pay | Admitting: *Deleted

## 2023-03-25 ENCOUNTER — Other Ambulatory Visit: Payer: Self-pay

## 2023-03-25 NOTE — Telephone Encounter (Signed)
  Chief Complaint: depression, insomnia Symptoms: depression is worse with increased health stressors, not eating normally, not sleeping Frequency: changes since last visit in February  Pertinent Negatives: Patient denies suicidal thoughts/plans Disposition: [] ED /[] Urgent Care (no appt availability in office) / [x] Appointment(In office/virtual)/ []  [facility]/ [] Home Care/ [] Refused Recommended Disposition /[] [facility]/ []  Follow-up with PCP Additional Notes: Message from [facility]- nurse that patient is suffering from worsening symptoms of depression with request to reach out to patient. Call to patient and he does endorse increased symptoms- he is at doctor with wife now. Patient has been scheduled for visit with provider to discuss symptoms.

## 2023-03-25 NOTE — Telephone Encounter (Signed)
Marc Aguilar with [facility] program just completed an evaluation with patient and patient scored high on PHQ. Patient states that he is not sleeping and is very depressed. Marc Aguilar is not currently with patient. Please follow up with patient.  Reason for Disposition  [1] Depression AND [2] worsening (e.g., sleeping poorly, less able to do activities of daily living)  Answer Assessment - Initial Assessment Questions 1. CONCERN: "What happened that made you call today?"     Depression, lack of sleep 2. DEPRESSION SYMPTOM SCREENING: "How are you feeling overall?" (e.g., decreased energy, increased sleeping or difficulty sleeping, difficulty concentrating, feelings of sadness, guilt, hopelessness, or worthlessness)     Changes in family health status 3. RISK OF HARM - SUICIDAL IDEATION:  "Do you ever have thoughts of hurting or killing yourself?"  (e.g., yes, no, no but preoccupation with thoughts about death)   - INTENT:  "Do you have thoughts of hurting or killing yourself right NOW?" (e.g., yes, no, N/A)   - PLAN: "Do you have a specific plan for how you would do this?" (e.g., gun, knife, overdose, no plan, N/A)     no 4. RISK OF HARM - HOMICIDAL IDEATION:  "Do you ever have thoughts of hurting or killing someone else?"  (e.g., yes, no, no but preoccupation with thoughts about death)   - INTENT:  "Do you have thoughts of hurting or killing someone right NOW?" (e.g., yes, no, N/A)   - PLAN: "Do you have a specific plan for how you would do this?" (e.g., gun, knife, no plan, N/A)      no 5. FUNCTIONAL IMPAIRMENT: "How have things been going for you overall? Have you had more difficulty than usual doing your normal daily activities?"  (e.g., better, same, worse; self-care, school, work, interactions)     Difficulty sleeping 6. SUPPORT: "Who is with you now?" "Who do you live with?" "Do you have family or friends who you can talk to?"      family 7. THERAPIST: "Do you have a counselor or therapist?  Name?"     Not asked 8. STRESSORS: "Has there been any new stress or recent changes in your life?"     Health changes 9. ALCOHOL USE OR SUBSTANCE USE (DRUG USE): "Do you drink alcohol or use any illegal drugs?"     Not asked 10. OTHER: "Do you have any other physical symptoms right now?" (e.g., fever)       Lack of sleep, nausea, eating changes- not hungry  Protocols used: Depression-A-AH

## 2023-03-28 ENCOUNTER — Ambulatory Visit (INDEPENDENT_AMBULATORY_CARE_PROVIDER_SITE_OTHER): Payer: 59 | Admitting: Internal Medicine

## 2023-03-28 ENCOUNTER — Other Ambulatory Visit: Payer: Self-pay

## 2023-03-28 VITALS — BP 126/78 | HR 104 | Temp 96.4°F | Ht 71.0 in | Wt 226.0 lb

## 2023-03-28 DIAGNOSIS — E6609 Other obesity due to excess calories: Secondary | ICD-10-CM | POA: Diagnosis not present

## 2023-03-28 DIAGNOSIS — Z0001 Encounter for general adult medical examination with abnormal findings: Secondary | ICD-10-CM

## 2023-03-28 DIAGNOSIS — G4701 Insomnia due to medical condition: Secondary | ICD-10-CM | POA: Diagnosis not present

## 2023-03-28 DIAGNOSIS — Z125 Encounter for screening for malignant neoplasm of prostate: Secondary | ICD-10-CM | POA: Diagnosis not present

## 2023-03-28 DIAGNOSIS — Z6831 Body mass index (BMI) 31.0-31.9, adult: Secondary | ICD-10-CM | POA: Diagnosis not present

## 2023-03-28 DIAGNOSIS — E1165 Type 2 diabetes mellitus with hyperglycemia: Secondary | ICD-10-CM | POA: Diagnosis not present

## 2023-03-28 LAB — CBC
MCHC: 31.9 g/dL — ABNORMAL LOW (ref 32.0–36.0)
MCV: 82.8 fL (ref 80.0–100.0)
Platelets: 255 10*3/uL (ref 140–400)
WBC: 6.8 10*3/uL (ref 3.8–10.8)

## 2023-03-28 MED ORDER — TRAZODONE HCL 100 MG PO TABS
200.0000 mg | ORAL_TABLET | Freq: Every day | ORAL | 0 refills | Status: DC
Start: 2023-03-28 — End: 2023-06-18
  Filled 2023-03-28: qty 60, 30d supply, fill #0

## 2023-03-28 NOTE — Patient Instructions (Signed)
Health Maintenance, Male Adopting a healthy lifestyle and getting preventive care are important in promoting health and wellness. Ask your health care provider about: The right schedule for you to have regular tests and exams. Things you can do on your own to prevent diseases and keep yourself healthy. What should I know about diet, weight, and exercise? Eat a healthy diet  Eat a diet that includes plenty of vegetables, fruits, low-fat dairy products, and lean protein. Do not eat a lot of foods that are high in solid fats, added sugars, or sodium. Maintain a healthy weight Body mass index (BMI) is a measurement that can be used to identify possible weight problems. It estimates body fat based on height and weight. Your health care provider can help determine your BMI and help you achieve or maintain a healthy weight. Get regular exercise Get regular exercise. This is one of the most important things you can do for your health. Most adults should: Exercise for at least 150 minutes each week. The exercise should increase your heart rate and make you sweat (moderate-intensity exercise). Do strengthening exercises at least twice a week. This is in addition to the moderate-intensity exercise. Spend less time sitting. Even light physical activity can be beneficial. Watch cholesterol and blood lipids Have your blood tested for lipids and cholesterol at 60 years of age, then have this test every 5 years. You may need to have your cholesterol levels checked more often if: Your lipid or cholesterol levels are high. You are older than 60 years of age. You are at high risk for heart disease. What should I know about cancer screening? Many types of cancers can be detected early and may often be prevented. Depending on your health history and family history, you may need to have cancer screening at various ages. This may include screening for: Colorectal cancer. Prostate cancer. Skin cancer. Lung  cancer. What should I know about heart disease, diabetes, and high blood pressure? Blood pressure and heart disease High blood pressure causes heart disease and increases the risk of stroke. This is more likely to develop in people who have high blood pressure readings or are overweight. Talk with your health care provider about your target blood pressure readings. Have your blood pressure checked: Every 3-5 years if you are 18-39 years of age. Every year if you are 40 years old or older. If you are between the ages of 65 and 75 and are a current or former smoker, ask your health care provider if you should have a one-time screening for abdominal aortic aneurysm (AAA). Diabetes Have regular diabetes screenings. This checks your fasting blood sugar level. Have the screening done: Once every three years after age 45 if you are at a normal weight and have a low risk for diabetes. More often and at a younger age if you are overweight or have a high risk for diabetes. What should I know about preventing infection? Hepatitis B If you have a higher risk for hepatitis B, you should be screened for this virus. Talk with your health care provider to find out if you are at risk for hepatitis B infection. Hepatitis C Blood testing is recommended for: Everyone born from 1945 through 1965. Anyone with known risk factors for hepatitis C. Sexually transmitted infections (STIs) You should be screened each year for STIs, including gonorrhea and chlamydia, if: You are sexually active and are younger than 60 years of age. You are older than 60 years of age and your   health care provider tells you that you are at risk for this type of infection. Your sexual activity has changed since you were last screened, and you are at increased risk for chlamydia or gonorrhea. Ask your health care provider if you are at risk. Ask your health care provider about whether you are at high risk for HIV. Your health care provider  may recommend a prescription medicine to help prevent HIV infection. If you choose to take medicine to prevent HIV, you should first get tested for HIV. You should then be tested every 3 months for as long as you are taking the medicine. Follow these instructions at home: Alcohol use Do not drink alcohol if your health care provider tells you not to drink. If you drink alcohol: Limit how much you have to 0-2 drinks a day. Know how much alcohol is in your drink. In the [place] one drink equals one 12 oz bottle of beer (355 mL), one 5 oz glass of wine (148 mL), or one 1 oz glass of hard liquor (44 mL). Lifestyle Do not use any products that contain nicotine or tobacco. These products include cigarettes, chewing tobacco, and vaping devices, such as e-cigarettes. If you need help quitting, ask your health care provider. Do not use street drugs. Do not share needles. Ask your health care provider for help if you need support or information about quitting drugs. General instructions Schedule regular health, dental, and eye exams. Stay current with your vaccines. Tell your health care provider if: You often feel depressed. You have ever been abused or do not feel safe at home. Summary Adopting a healthy lifestyle and getting preventive care are important in promoting health and wellness. Follow your health care provider's instructions about healthy diet, exercising, and getting tested or screened for diseases. Follow your health care provider's instructions on monitoring your cholesterol and blood pressure. This information is not intended to replace advice given to you by your health care provider. Make sure you discuss any questions you have with your health care provider. Document Revised: 02/23/2021 Document Reviewed: 02/23/2021 Elsevier Patient Education  2024 Elsevier Inc.  

## 2023-03-28 NOTE — Progress Notes (Signed)
Subjective:    Patient ID: Marc Aguilar, male    DOB: 1961-04-25, 62 y.o.   MRN: [id number]  HPI  Patient presents to clinic today for his annual exam. He also wants to follow up anxiety and insomnia. He has been under a lot of stress lately. His wife recently broke her leg and is in rehab. He reports it takes him a while to fall asleep but he can not stay asleep. He has felt restless at night. He is taking  up to 150 mg of Trazodone. He does have a history of OSA, wears CPAP machine.  Flu: 07/2022 Tetanus: 10/2016 COVID: Pfizer x 3 Pneumovax: 10/2018 Shingrix: Never PSA screening: 01/2023 Colon screening: 03/2014 Vision screening: annually Dentist: biannually  Diet: He does eat meat. He consumes fruits and veggies. He tries to avoid fried foods. He drinks mostly beer. Exercise: None  Review of Systems     Past Medical History:  Diagnosis Date   Anxiety    Arthritis    "knees and hips" (02/28/2018)   Cancer (HCC)    prostate    Chicken pox    Depression    GERD (gastroesophageal reflux disease)    Heart murmur    Hernia, abdominal    High cholesterol    History of gout    Hypertension    Migraine    "probably monthly" (02/28/2018)   Motion sickness    ocean boats, circular motion   Neck pain    OSA on CPAP    Pneumonia    "once" (02/28/2018)   Refusal of blood transfusions as patient is Jehovah's Witness    Seasonal allergies    Type II diabetes mellitus (HCC)     Current Outpatient Medications  Medication Sig Dispense Refill   acetaminophen (TYLENOL) 500 MG tablet Take 2 tablets (1,000 mg total) by mouth every 8 (eight) hours. 90 tablet 2   albuterol (VENTOLIN HFA) 108 (90 Base) MCG/ACT inhaler Inhale 2 puffs into the lungs every 6 (six) hours as needed for wheezing or shortness of breath. 1 Inhaler 0   Alcohol Swabs (ALCOHOL PREP) PADS 1 each by Does not apply route 2 (two) times daily. 200 each 3   amLODipine (NORVASC) 10 MG tablet Take 1 tablet (10 mg total)  by mouth daily. 90 tablet 1   atorvastatin (LIPITOR) 20 MG tablet Take 1 tablet (20 mg total) by mouth daily. (Patient taking differently: Take 20 mg by mouth at bedtime.) 90 tablet 0   buPROPion (WELLBUTRIN XL) 300 MG 24 hr tablet Take 1 tablet (300 mg total) by mouth daily. 90 tablet 0   citalopram (CELEXA) 40 MG tablet TAKE 1 TABLET (40 MG TOTAL) BY MOUTH DAILY. 90 tablet 0   ezetimibe (ZETIA) 10 MG tablet Take 1 tablet (10 mg total) by mouth daily. 90 tablet 0   glipiZIDE (GLIPIZIDE XL) 5 MG 24 hr tablet Take 1 tablet (5 mg total) by mouth daily with breakfast. 90 tablet 0   Lancets MISC 1 each by Does not apply route in the morning and at bedtime. 200 each 2   metoCLOPramide (REGLAN) 5 MG tablet Take 1 tablet (5 mg total) by mouth in the morning and at bedtime. (Patient not taking: Reported on 03/10/2023) 60 tablet 0   Multiple Vitamins-Minerals (MULTIVITAMIN WITH MINERALS) tablet Take 1 tablet by mouth daily. (Patient not taking: Reported on 03/10/2023)     pantoprazole (PROTONIX) 40 MG tablet Take 1 tablet (40 mg total) by mouth 2 (two)  times daily before a meal. 60 tablet 5   tadalafil (CIALIS) 20 MG tablet Take 1 tablet (20 mg total) by mouth daily as needed for erectile dysfunction. 10 tablet 6   traZODone (DESYREL) 50 MG tablet Take 1 tablet (50 mg total) by mouth at bedtime as needed for sleep. 90 tablet 0   No current facility-administered medications for this visit.    Allergies  Allergen Reactions   Tape Rash    Paper tape-blisters    Family History  Problem Relation Age of Onset   Diabetes Mother    Hyperlipidemia Mother    Bladder Cancer Father    Liver cancer Sister    Diabetes Sister    Diabetes Maternal Aunt    Arthritis Maternal Grandmother    Diabetes Maternal Grandmother    Arthritis Maternal Grandfather    Arthritis Paternal Grandmother    Diabetes Brother    Heart disease Brother    Colon cancer Neg Hx     Social History   Socioeconomic History    Marital status: Married    Spouse name: Not on file   Number of children: 2   Years of education: Not on file   Highest education level: Not on file  Occupational History   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  Tobacco Use   Smoking status: Former    Packs/day: 1.00    Years: 27.00    Additional pack years: 0.00    Total pack years: 27.00    Types: Cigarettes    Quit date: 10/18/2009    Years since quitting: 13.4   Smokeless tobacco: Former    Types: Chew    Quit date: 10/18/1994  Vaping Use   Vaping Use: Never used  Substance and Sexual Activity   Alcohol use: Yes    Alcohol/week: 21.0 standard drinks of alcohol    Types: 21 Cans of beer per week   Drug use: Never   Sexual activity: Not on file  Other Topics Concern   Not on file  Social History Narrative   Not on file   Social Determinants of Health   Financial Resource Strain: Not on file  Food Insecurity: Not on file  Transportation Needs: Not on file  Physical Activity: Not on file  Stress: Not on file  Social Connections: Not on file  Intimate Partner Violence: Not on file     Constitutional: Patient reports intermittent headaches.  Denies fever, malaise, fatigue, or abrupt weight changes.  HEENT: Denies eye pain, eye redness, ear pain, ringing in the ears, wax buildup, runny nose, nasal congestion, bloody nose, or sore throat. Respiratory: Denies difficulty breathing, shortness of breath, cough or sputum production.   Cardiovascular: Denies chest pain, chest tightness, palpitations or swelling in the hands or feet.  Gastrointestinal: Denies abdominal pain, bloating, constipation, diarrhea or blood in the stool.  GU: Denies urgency, frequency, pain with urination, burning sensation, blood in urine, odor or discharge. Musculoskeletal: Patient reports chronic neck pain.  Denies decrease in range of motion, difficulty with gait, muscle pain or joint swelling.  Skin: Denies redness, rashes, lesions or ulcercations.  Neurological:  Patient reports paresthesias of upper extremities, insomnia.  Denies dizziness, difficulty with memory, difficulty with speech or problems with balance and coordination.  Psych: Patient has a history of anxiety and depression.  Denies SI/HI.  No other specific complaints in a complete review of systems (except as listed in HPI above).  Objective:   Physical Exam  BP 126/78 (BP Location: Right Arm,  Patient Position: Sitting, Cuff Size: Large)   Pulse (!) 104   Temp (!) 96.4 F (35.8 C) (Temporal)   Ht 5\' 11"  (1.803 m)   Wt 226 lb (102.5 kg)   SpO2 98%   BMI 31.52 kg/m   Wt Readings from Last 3 Encounters:  03/17/23 220 lb (99.8 kg)  02/17/23 219 lb (99.3 kg)  12/16/22 221 lb 9.6 oz (100.5 kg)    General: Appears his stated age, obese, in NAD. Skin: Warm, dry and intact. No ulcerations noted. HEENT: Head: normal shape and size; Eyes: sclera white, no icterus, conjunctiva pink, PERRLA and EOMs intact;  Neck:  Neck supple, trachea midline. No masses, lumps or thyromegaly present.  Cardiovascular: Tachycardic with normal rhythm. S1,S2 noted.  No murmur, rubs or gallops noted. No JVD or BLE edema. No carotid bruits noted. Pulmonary/Chest: Normal effort and positive vesicular breath sounds. No respiratory distress. No wheezes, rales or ronchi noted.  Abdomen: Normal bowel sounds.  Musculoskeletal: Strength 5/5 BUE/BLE. No difficulty with gait.  Neurological: Alert and oriented. Cranial nerves II-XII grossly intact. Coordination normal.  Psychiatric: Mood and affect normal. Behavior is normal. Judgment and thought content normal.     BMET    Component Value Date/Time   NA 140 11/23/2022 1329   K 3.9 11/23/2022 1329   CL 102 11/23/2022 1329   CO2 27 11/23/2022 1329   GLUCOSE 145 (H) 11/23/2022 1329   BUN 13 11/23/2022 1329   CREATININE 1.11 11/23/2022 1329   CALCIUM 9.7 11/23/2022 1329   GFRNONAA >60 09/26/2020 1951   GFRAA >60 11/20/2019 0445    Lipid Panel     Component  Value Date/Time   CHOL 260 (H) 10/05/2022 1456   CHOL 157 10/26/2019 1203   TRIG 315 (H) 10/05/2022 1456   HDL 57 10/05/2022 1456   HDL 48 10/26/2019 1203   CHOLHDL 4.6 10/05/2022 1456   VLDL 60.2 (H) 01/01/2021 1047   LDLCALC 155 (H) 10/05/2022 1456    CBC    Component Value Date/Time   WBC 10.0 10/05/2022 1456   RBC 5.32 10/05/2022 1456   HGB 14.3 10/05/2022 1456   HCT 44.1 10/05/2022 1456   PLT 254 10/05/2022 1456   MCV 82.9 10/05/2022 1456   MCH 26.9 (L) 10/05/2022 1456   MCHC 32.4 10/05/2022 1456   RDW 14.9 10/05/2022 1456   LYMPHSABS 3.0 09/26/2020 1951   MONOABS 0.6 09/26/2020 1951   EOSABS 0.2 09/26/2020 1951   BASOSABS 0.1 09/26/2020 1951    Hgb A1C Lab Results  Component Value Date   HGBA1C 7.2 (A) 10/05/2022            Assessment & Plan:   Preventative Health Maintenance:  Encouraged him to get a flu shot in the fall Tetanus UTD Encouraged him to get his COVID booster Pneumovax UTD Discussed Shingrix vaccine, he will check coverage with his insurance company schedule visit if he would like to have this done Colon screening UTD Encouraged him to consume a balanced diet and exercise regimen Advised him to see an eye doctor and dentist annually Will check CBC, CMET, Lipid, A1C  and PSA today  RTC in 3 months, follow up chronic conditions Nicki Reaper, NP

## 2023-03-28 NOTE — Assessment & Plan Note (Signed)
Increase trazodone to 200 mg at bedtime

## 2023-03-28 NOTE — Assessment & Plan Note (Signed)
Encourage diet and exercise for weight loss 

## 2023-03-29 ENCOUNTER — Other Ambulatory Visit: Payer: Self-pay

## 2023-03-29 DIAGNOSIS — C61 Malignant neoplasm of prostate: Secondary | ICD-10-CM

## 2023-03-29 LAB — LIPID PANEL
Cholesterol: 166 mg/dL (ref <200)
HDL: 52 mg/dL (ref >=40)
LDL Cholesterol (Calc): 86 mg/dL (calc)
Non-HDL Cholesterol (Calc): 114 mg/dL (calc) (ref <130)
Total CHOL/HDL Ratio: 3.2 (calc) (ref <5.0)
Triglycerides: 183 mg/dL — ABNORMAL HIGH (ref <150)

## 2023-03-29 LAB — HEMOGLOBIN A1C
Hgb A1c MFr Bld: 6.9 % of total Hgb — ABNORMAL HIGH (ref <5.7)
Mean Plasma Glucose: 151 mg/dL
eAG (mmol/L): 8.4 mmol/L

## 2023-03-29 LAB — COMPLETE METABOLIC PANEL WITH GFR
AG Ratio: 1.6 (calc) (ref 1.0–2.5)
ALT: 15 U/L (ref 9–46)
AST: 16 U/L (ref 10–35)
Albumin: 4.4 g/dL (ref 3.6–5.1)
Alkaline phosphatase (APISO): 79 U/L (ref 35–144)
BUN: 12 mg/dL (ref 7–25)
CO2: 29 mmol/L (ref 20–32)
Calcium: 9.8 mg/dL (ref 8.6–10.3)
Chloride: 100 mmol/L (ref 98–110)
Creat: 0.98 mg/dL (ref 0.70–1.30)
Globulin: 2.8 g/dL (calc) (ref 1.9–3.7)
Glucose, Bld: 194 mg/dL — ABNORMAL HIGH (ref 65–99)
Potassium: 4.2 mmol/L (ref 3.5–5.3)
Sodium: 138 mmol/L (ref 135–146)
Total Bilirubin: 0.7 mg/dL (ref 0.2–1.2)
Total Protein: 7.2 g/dL (ref 6.1–8.1)
eGFR: 89 mL/min/{1.73_m2} (ref >=60)

## 2023-03-29 LAB — CBC
HCT: 43.9 % (ref 38.5–50.0)
Hemoglobin: 14 g/dL (ref 13.2–17.1)
MCH: 26.4 pg — ABNORMAL LOW (ref 27.0–33.0)
MPV: 9.9 fL (ref 7.5–12.5)
RBC: 5.3 10*6/uL (ref 4.20–5.80)
RDW: 14 % (ref 11.0–15.0)

## 2023-03-29 LAB — PSA: PSA: 0.11 ng/mL (ref <=4.00)

## 2023-03-30 DIAGNOSIS — K31A19 Gastric intestinal metaplasia without dysplasia, unspecified site: Secondary | ICD-10-CM | POA: Insufficient documentation

## 2023-04-08 NOTE — Telephone Encounter (Signed)
Referral faxed to [facility].

## 2023-04-11 ENCOUNTER — Encounter: Payer: Self-pay | Admitting: Gastroenterology

## 2023-04-11 NOTE — Anesthesia Preprocedure Evaluation (Addendum)
Anesthesia Evaluation  Patient identified by MRN, date of birth, ID band Patient awake    Reviewed: Allergy & Precautions, H&P , NPO status , Patient's Chart, lab work & pertinent test results  Airway Mallampati: II  TM Distance: >3 FB Neck ROM: Full    Dental no notable dental hx.    Pulmonary sleep apnea , pneumonia, former smoker   Pulmonary exam normal breath sounds clear to auscultation       Cardiovascular hypertension, Normal cardiovascular exam+ Valvular Problems/Murmurs  Rhythm:Regular Rate:Normal  03-01-18 EF 65-70%, no RWMA, concentric LVH   Neuro/Psych  Headaches PSYCHIATRIC DISORDERS Anxiety Depression     Neuromuscular disease    GI/Hepatic Neg liver ROS,GERD  ,,  Endo/Other  diabetes    Renal/GU negative Renal ROS  negative genitourinary   Musculoskeletal  (+) Arthritis ,    Abdominal   Peds negative pediatric ROS (+)  Hematology negative hematology ROS (+)   Anesthesia Other Findings GERD (gastroesophageal reflux disease) Heart murmur Hernia, abdominal Refusal of blood transfusions; patient is Jehovah's Witness  High cholesterol Seasonal allergies  Pneumonia OSA on CPAP Type II diabetes mellitus (HCC) Migraine  History of gout Arthritis  Cancer (HCC) Anxiety  Depression Neck pain  Hypertension Motion sickness     Reproductive/Obstetrics negative OB ROS                              Anesthesia Physical Anesthesia Plan  ASA: 3  Anesthesia Plan: General   Post-op Pain Management:    Induction: Intravenous  PONV Risk Score and Plan:   Airway Management Planned: Natural Airway and Nasal Cannula  Additional Equipment:   Intra-op Plan:   Post-operative Plan:   Informed Consent: I have reviewed the patients History and Physical, chart, labs and discussed the procedure including the risks, benefits and alternatives for the proposed anesthesia with the  patient or authorized representative who has indicated his/her understanding and acceptance.     Dental Advisory Given  Plan Discussed with: Anesthesiologist, CRNA and Surgeon  Anesthesia Plan Comments: (Patient consented for risks of anesthesia including but not limited to:  - adverse reactions to medications - risk of airway placement if required - damage to eyes, teeth, lips or other oral mucosa - nerve damage due to positioning  - sore throat or hoarseness - Damage to heart, brain, nerves, lungs, other parts of body or loss of life  Patient voiced understanding.)         Anesthesia Quick Evaluation

## 2023-04-18 ENCOUNTER — Ambulatory Visit: Payer: 59 | Admitting: Anesthesiology

## 2023-04-18 ENCOUNTER — Other Ambulatory Visit: Payer: Self-pay

## 2023-04-18 ENCOUNTER — Encounter: Payer: Self-pay | Admitting: Gastroenterology

## 2023-04-18 ENCOUNTER — Ambulatory Visit
Admission: RE | Admit: 2023-04-18 | Discharge: 2023-04-18 | Disposition: A | Discharge status: Home / Self Care | Payer: 59 | Attending: Gastroenterology | Admitting: Gastroenterology

## 2023-04-18 ENCOUNTER — Encounter
Admission: RE | Disposition: A | Discharge status: Home / Self Care | Payer: Self-pay | Source: Home / Self Care | Attending: Gastroenterology

## 2023-04-18 DIAGNOSIS — Z09 Encounter for follow-up examination after completed treatment for conditions other than malignant neoplasm: Secondary | ICD-10-CM | POA: Insufficient documentation

## 2023-04-18 DIAGNOSIS — Z87891 Personal history of nicotine dependence: Secondary | ICD-10-CM | POA: Diagnosis not present

## 2023-04-18 DIAGNOSIS — E119 Type 2 diabetes mellitus without complications: Secondary | ICD-10-CM | POA: Insufficient documentation

## 2023-04-18 DIAGNOSIS — K2970 Gastritis, unspecified, without bleeding: Secondary | ICD-10-CM | POA: Insufficient documentation

## 2023-04-18 DIAGNOSIS — K31A0 Gastric intestinal metaplasia, unspecified: Secondary | ICD-10-CM | POA: Diagnosis not present

## 2023-04-18 DIAGNOSIS — G4733 Obstructive sleep apnea (adult) (pediatric): Secondary | ICD-10-CM | POA: Diagnosis not present

## 2023-04-18 DIAGNOSIS — K31A19 Gastric intestinal metaplasia without dysplasia, unspecified site: Secondary | ICD-10-CM | POA: Diagnosis not present

## 2023-04-18 DIAGNOSIS — F419 Anxiety disorder, unspecified: Secondary | ICD-10-CM | POA: Insufficient documentation

## 2023-04-18 DIAGNOSIS — Z7984 Long term (current) use of oral hypoglycemic drugs: Secondary | ICD-10-CM | POA: Diagnosis not present

## 2023-04-18 DIAGNOSIS — F32A Depression, unspecified: Secondary | ICD-10-CM | POA: Insufficient documentation

## 2023-04-18 DIAGNOSIS — I10 Essential (primary) hypertension: Secondary | ICD-10-CM | POA: Insufficient documentation

## 2023-04-18 DIAGNOSIS — K219 Gastro-esophageal reflux disease without esophagitis: Secondary | ICD-10-CM | POA: Diagnosis not present

## 2023-04-18 HISTORY — PX: ESOPHAGOGASTRODUODENOSCOPY (EGD) WITH PROPOFOL: SHX5813

## 2023-04-18 LAB — GLUCOSE, CAPILLARY: Glucose-Capillary: 220 mg/dL — ABNORMAL HIGH (ref 70–99)

## 2023-04-18 SURGERY — ESOPHAGOGASTRODUODENOSCOPY (EGD) WITH PROPOFOL
Anesthesia: General | Site: Esophagus

## 2023-04-18 MED ORDER — SODIUM CHLORIDE 0.9 % IV SOLN: INTRAVENOUS | Status: DC

## 2023-04-18 MED ORDER — LIDOCAINE HCL (CARDIAC) PF 100 MG/5ML IV SOSY
PREFILLED_SYRINGE | INTRAVENOUS | Status: DC | PRN
  Administered 2023-04-18: 60 mg via INTRAVENOUS

## 2023-04-18 MED ORDER — PANTOPRAZOLE SODIUM 40 MG PO TBEC
40.0000 mg | DELAYED_RELEASE_TABLET | Freq: Every day | ORAL | 11 refills | Status: DC
  Filled 2023-04-18: qty 30, 30d supply, fill #0
  Filled 2023-06-18: qty 30, 30d supply, fill #1

## 2023-04-18 MED ORDER — LACTATED RINGERS IV SOLN: INTRAVENOUS | Status: DC

## 2023-04-18 MED ORDER — STERILE WATER FOR IRRIGATION IR SOLN
Status: DC | PRN
  Administered 2023-04-18: 1

## 2023-04-18 MED ORDER — PROPOFOL 10 MG/ML IV BOLUS
INTRAVENOUS | Status: DC | PRN
  Administered 2023-04-18 (×3): 70 mg via INTRAVENOUS

## 2023-04-18 SURGICAL SUPPLY — 32 items

## 2023-04-18 NOTE — Op Note (Addendum)
[facility] Patient Name: Marc Aguilar Procedure Date: 04/18/2023 10:23 AM MRN: [id number] Account #: [id number] Date of Birth: 1961-09-05 Admit Type: Outpatient Age: 62 Room: [facility] OR ROOM 01 Gender: Male Note Status: Finalized Instrument Name: [id number] Procedure:             Upper GI endoscopy Indications:           Intestinal metaplasia Providers:             [name] MD, MD Referring MD:          Lorre Munroe (Referring MD) Medicines:             Propofol per Anesthesia Complications:         No immediate complications. Procedure:             Pre-Anesthesia Assessment:                        - Prior to the procedure, a History and Physical was                         performed, and patient medications and allergies were                         reviewed. The patient's tolerance of previous                         anesthesia was also reviewed. The risks and benefits                         of the procedure and the sedation options and risks                         were discussed with the patient. All questions were                         answered, and informed consent was obtained. Prior                         Anticoagulants: The patient has taken no anticoagulant                         or antiplatelet agents. ASA Grade Assessment: II - A                         patient with mild systemic disease. After reviewing                         the risks and benefits, the patient was deemed in                         satisfactory condition to undergo the procedure.                        After obtaining informed consent, the endoscope was                         passed under direct vision. Throughout the procedure,  the patient's blood pressure, pulse, and oxygen                         saturations were monitored continuously. The Endoscope                         was introduced through the mouth, and advanced to the                          second part of duodenum. The upper GI endoscopy was                         accomplished without difficulty. The patient tolerated                         the procedure well. Findings:      The examined esophagus was normal.      Diffuse mild inflammation characterized by erythema was found in the       gastric antrum. Biopsies were taken with a cold forceps for histology       and for gastric mapping.      The examined duodenum was normal. Impression:            - Normal esophagus.                        - Gastritis. Biopsied.                        - Normal examined duodenum.                        - Biopsies for gastric mapping Recommendation:        - Discharge patient to home.                        - Resume previous diet.                        - Continue present medications.                        - Await pathology results.                        - Repeat upper endoscopy in 3 years for surveillance. Procedure Code(s):     --- Professional ---                        [phone], Esophagogastroduodenoscopy, flexible,                         transoral; with biopsy, single or multiple Diagnosis Code(s):     --- Professional ---                        K31.A0, Gastric intestinal metaplasia, unspecified CPT copyright 2022 American Medical Association. All rights reserved. The codes documented in this report are preliminary and upon coder review may  be revised to meet current compliance requirements. [name] MD, MD 04/18/2023 10:37:14 AM This report has been signed electronically. Number of Addenda: 0 Note Initiated On: 04/18/2023 10:23  AM Total Procedure Duration: 0 hours 5 minutes 48 seconds  Estimated Blood Loss:  Estimated blood loss: none.      [facility]

## 2023-04-18 NOTE — Transfer of Care (Signed)
Immediate Anesthesia Transfer of Care Note  Patient: Marc Aguilar  Procedure(s) Performed: ESOPHAGOGASTRODUODENOSCOPY (EGD) WITH PROPOFOL (Esophagus)  Patient Location: PACU  Anesthesia Type: General  Level of Consciousness: awake, alert  and patient cooperative  Airway and Oxygen Therapy: Patient Spontanous Breathing and Patient connected to supplemental oxygen  Post-op Assessment: Post-op Vital signs reviewed, Patient's Cardiovascular Status Stable, Respiratory Function Stable, Patent Airway and No signs of Nausea or vomiting  Post-op Vital Signs: Reviewed and stable  Complications: No notable events documented.

## 2023-04-18 NOTE — H&P (Signed)
[name], MD [facility] [address], [address] [place], [place] [address] [phone] Fax : [phone]  Primary Care Physician:  Lorre Munroe, NP Primary Gastroenterologist:  Dr. Servando Snare  Pre-Procedure History & Physical: HPI:  Marc Aguilar is a 62 y.o. male is here for an endoscopy.   Past Medical History:  Diagnosis Date   Anxiety    Arthritis    "knees and hips" (02/28/2018)   Cancer (HCC)    prostate    Chicken pox    Depression    GERD (gastroesophageal reflux disease)    Heart murmur    Hernia, abdominal    High cholesterol    History of gout    Hypertension    Migraine    "probably monthly" (02/28/2018)   Motion sickness    ocean boats, circular motion   Neck pain    OSA on CPAP    Pneumonia    "once" (02/28/2018)   Refusal of blood transfusions as patient is Jehovah's Witness    Seasonal allergies    Type II diabetes mellitus (HCC)     Past Surgical History:  Procedure Laterality Date   CARDIAC CATHETERIZATION  02/28/2018   ESOPHAGOGASTRODUODENOSCOPY (EGD) WITH PROPOFOL N/A 03/17/2023   Procedure: ESOPHAGOGASTRODUODENOSCOPY (EGD) WITH PROPOFOL;  Surgeon: [name], MD;  Location: [facility];  Service: Endoscopy;  Laterality: N/A;  Diabetic   LEFT HEART CATH AND CORONARY ANGIOGRAPHY N/A 02/28/2018   Procedure: LEFT HEART CATH AND CORONARY ANGIOGRAPHY;  Surgeon: Tonny Bollman, MD;  Location: [facility] INVAS[facility];  Service: Cardiovascular;  Laterality: N/A;   ROBOT ASSISTED LAPAROSCOPIC RADICAL PROSTATECTOMY N/A 11/19/2019   Procedure: XI ROBOTIC ASSISTED LAPAROSCOPIC RADICAL PROSTATECTOMY WITH LYMPH NODE DISSECTION;  Surgeon: Vanna Scotland, MD;  Location: [facility] ORS;  Service: Urology;  Laterality: N/A;   SHOULDER ARTHROSCOPY WITH ROTATOR CUFF REPAIR AND SUBACROMIAL DECOMPRESSION Right 05/21/2022   Procedure: Right shoulder arthroscopic rotator cuff repair (supraspinatus & subscapularis), subacromial decompression, and biceps tenodesis;  Surgeon:  Signa Kell, MD;  Location: [facility] OR[facility];  Service: Orthopedics;  Laterality: Right;   WRIST GANGLION EXCISION Left 1984    Prior to Admission medications   Medication Sig Start Date End Date Taking? Authorizing Provider  albuterol (VENTOLIN HFA) 108 (90 Base) MCG/ACT inhaler Inhale 2 puffs into the lungs every 6 (six) hours as needed for wheezing or shortness of breath. 03/26/19  Yes Lorre Munroe, NP  amLODipine (NORVASC) 10 MG tablet Take 1 tablet (10 mg total) by mouth daily. 03/18/22  Yes Baity, Salvadore Oxford, NP  atorvastatin (LIPITOR) 20 MG tablet Take 1 tablet (20 mg total) by mouth daily. Patient taking differently: Take 20 mg by mouth at bedtime. 05/13/21  Yes Worthy Rancher B, FNP  buPROPion (WELLBUTRIN XL) 300 MG 24 hr tablet Take 1 tablet (300 mg total) by mouth daily. 06/15/22  Yes Baity, Salvadore Oxford, NP  citalopram (CELEXA) 40 MG tablet TAKE 1 TABLET (40 MG TOTAL) BY MOUTH DAILY. 11/23/22  Yes Lorre Munroe, NP  ezetimibe (ZETIA) 10 MG tablet Take 1 tablet (10 mg total) by mouth daily. 06/15/22  Yes Lorre Munroe, NP  glipiZIDE (GLIPIZIDE XL) 5 MG 24 hr tablet Take 1 tablet (5 mg total) by mouth daily with breakfast. 10/05/22  Yes Baity, Salvadore Oxford, NP  pantoprazole (PROTONIX) 40 MG tablet Take 1 tablet (40 mg total) by mouth 2 (two) times daily before a meal. 02/17/23  Yes [name], MD  traZODone (DESYREL) 100 MG tablet Take 2 tablets (  200 mg total) by mouth at bedtime. 03/28/23  Yes Lorre Munroe, NP  acetaminophen (TYLENOL) 500 MG tablet Take 2 tablets (1,000 mg total) by mouth every 8 (eight) hours. Patient taking differently: Take 1,000 mg by mouth every 4 (four) hours as needed for mild pain. 05/21/22 05/21/23  Signa Kell, MD  Alcohol Swabs (ALCOHOL PREP) PADS 1 each by Does not apply route 2 (two) times daily. 02/01/17   Lorre Munroe, NP  Lancets MISC 1 each by Does not apply route in the morning and at bedtime. 02/06/20   Lorre Munroe, NP  tadalafil (CIALIS) 20 MG tablet Take 1 tablet  (20 mg total) by mouth daily as needed for erectile dysfunction. 07/27/22   Vanna Scotland, MD  traZODone (DESYREL) 50 MG tablet Take 1 tablet (50 mg total) by mouth at bedtime as needed for sleep. Patient not taking: Reported on 04/11/2023 10/05/22   Lorre Munroe, NP    Allergies as of 03/30/2023 - Review Complete 03/28/2023  Allergen Reaction Noted   Tape Rash 05/04/2016    Family History  Problem Relation Age of Onset   Diabetes Mother    Hyperlipidemia Mother    Bladder Cancer Father    Liver cancer Sister    Diabetes Sister    Diabetes Maternal Aunt    Arthritis Maternal Grandmother    Diabetes Maternal Grandmother    Arthritis Maternal Grandfather    Arthritis Paternal Grandmother    Diabetes Brother    Heart disease Brother    Colon cancer Neg Hx     Social History   Socioeconomic History   Marital status: Married    Spouse name: Not on file   Number of children: 2   Years of education: Not on file   Highest education level: Bachelor's degree (e.g., BA, AB, BS)  Occupational History   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  Tobacco Use   Smoking status: Former    Packs/day: 1.00    Years: 27.00    Additional pack years: 0.00    Total pack years: 27.00    Types: Cigarettes    Quit date: 10/18/2009    Years since quitting: 13.5   Smokeless tobacco: Former    Types: Chew    Quit date: 10/18/1994  Vaping Use   Vaping Use: Never used  Substance and Sexual Activity   Alcohol use: Yes    Alcohol/week: 21.0 standard drinks of alcohol    Types: 21 Cans of beer per week   Drug use: Never   Sexual activity: Not on file  Other Topics Concern   Not on file  Social History Narrative   Not on file   Social Determinants of Health   Financial Resource Strain: Low Risk  (03/28/2023)   Overall Financial Resource Strain (CARDIA)    Difficulty of Paying Living Expenses: Not hard at all  Food Insecurity: Food Insecurity Present (03/28/2023)   Hunger Vital Sign    Worried About  Running Out of Food in the Last Year: Sometimes true    Ran Out of Food in the Last Year: Sometimes true  Transportation Needs: No Transportation Needs (03/28/2023)   PRAPARE - Administrator, Civil Service (Medical): No    Lack of Transportation (Non-Medical): No  Physical Activity: Sufficiently Active (03/28/2023)   Exercise Vital Sign    Days of Exercise per Week: 1 day    Minutes of Exercise per Session: 150+ min  Stress: Stress Concern Present (03/28/2023)  [employer] of Occupational Health - Occupational Stress Questionnaire    Feeling of Stress : Very much  Social Connections: Moderately Isolated (03/28/2023)   Social Connection and Isolation Panel [NHANES]    Frequency of Communication with Friends and Family: Once a week    Frequency of Social Gatherings with Friends and Family: Never    Attends Religious Services: More than 4 times per year    Active Member of [employer] or Organizations: No    Attends Engineer, structural: Not on file    Marital Status: Married  Catering manager Violence: Not on file    Review of Systems: See HPI, otherwise negative ROS  Physical Exam: BP (!) 147/97   Pulse 80   Temp 97.7 F (36.5 C) (Temporal)   Resp 18   Ht 5\' 11"  (1.803 m)   Wt 102.1 kg   SpO2 100%   BMI 31.38 kg/m  General:   Alert,  pleasant and cooperative in NAD Head:  Normocephalic and atraumatic. Neck:  Supple; no masses or thyromegaly. Lungs:  Clear throughout to auscultation.    Heart:  Regular rate and rhythm. Abdomen:  Soft, nontender and nondistended. Normal bowel sounds, without guarding, and without rebound.   Neurologic:  Alert and  oriented x4;  grossly normal neurologically.  Impression/Plan: Marc Aguilar is here for an endoscopy to be performed for gastric intestinal metaplasia  Risks, benefits, limitations, and alternatives regarding  endoscopy have been reviewed with the patient.  Questions have been answered.  All parties  agreeable.   [name], MD  04/18/2023, 10:19 AM

## 2023-04-18 NOTE — Anesthesia Postprocedure Evaluation (Signed)
Anesthesia Post Note  Patient: Marc Aguilar  Procedure(s) Performed: ESOPHAGOGASTRODUODENOSCOPY (EGD) WITH PROPOFOL (Esophagus)  Patient location during evaluation: PACU Anesthesia Type: General Level of consciousness: awake and alert Pain management: pain level controlled Vital Signs Assessment: post-procedure vital signs reviewed and stable Respiratory status: spontaneous breathing, nonlabored ventilation, respiratory function stable and patient connected to nasal cannula oxygen Cardiovascular status: blood pressure returned to baseline and stable Postop Assessment: no apparent nausea or vomiting Anesthetic complications: no   No notable events documented.   Last Vitals:  Vitals:   04/18/23 1045 04/18/23 1050  BP:  (!) 147/94  Pulse: 77 74  Resp: (!) 24 16  Temp:  36.6 C  SpO2: 99% 97%    Last Pain:  Vitals:   04/18/23 1050  TempSrc:   PainSc: 0-No pain                 Prudence Heiny C Madiline Saffran

## 2023-04-19 ENCOUNTER — Encounter: Payer: Self-pay | Admitting: Gastroenterology

## 2023-04-21 ENCOUNTER — Encounter: Payer: Self-pay | Admitting: Gastroenterology

## 2023-04-27 ENCOUNTER — Other Ambulatory Visit: Payer: 59

## 2023-05-03 DIAGNOSIS — M5412 Radiculopathy, cervical region: Secondary | ICD-10-CM | POA: Diagnosis not present

## 2023-05-03 DIAGNOSIS — M6281 Muscle weakness (generalized): Secondary | ICD-10-CM | POA: Diagnosis not present

## 2023-05-06 ENCOUNTER — Encounter (HOSPITAL_COMMUNITY): Payer: Self-pay

## 2023-05-06 ENCOUNTER — Emergency Department (HOSPITAL_COMMUNITY)
Admission: EM | Admit: 2023-05-06 | Discharge: 2023-05-07 | Disposition: A | Discharge status: Home / Self Care | Payer: 59 | Attending: Emergency Medicine | Admitting: Emergency Medicine

## 2023-05-06 ENCOUNTER — Emergency Department (HOSPITAL_COMMUNITY): Payer: 59

## 2023-05-06 DIAGNOSIS — R0789 Other chest pain: Secondary | ICD-10-CM | POA: Diagnosis not present

## 2023-05-06 DIAGNOSIS — K219 Gastro-esophageal reflux disease without esophagitis: Secondary | ICD-10-CM | POA: Diagnosis not present

## 2023-05-06 DIAGNOSIS — Z7984 Long term (current) use of oral hypoglycemic drugs: Secondary | ICD-10-CM | POA: Diagnosis not present

## 2023-05-06 DIAGNOSIS — E1165 Type 2 diabetes mellitus with hyperglycemia: Secondary | ICD-10-CM | POA: Insufficient documentation

## 2023-05-06 DIAGNOSIS — Z1152 Encounter for screening for COVID-19: Secondary | ICD-10-CM | POA: Insufficient documentation

## 2023-05-06 DIAGNOSIS — R519 Headache, unspecified: Secondary | ICD-10-CM | POA: Diagnosis not present

## 2023-05-06 DIAGNOSIS — R000 Tachycardia, unspecified: Secondary | ICD-10-CM | POA: Insufficient documentation

## 2023-05-06 DIAGNOSIS — R0602 Shortness of breath: Secondary | ICD-10-CM | POA: Diagnosis not present

## 2023-05-06 DIAGNOSIS — R059 Cough, unspecified: Secondary | ICD-10-CM | POA: Diagnosis not present

## 2023-05-06 DIAGNOSIS — R739 Hyperglycemia, unspecified: Secondary | ICD-10-CM

## 2023-05-06 DIAGNOSIS — R12 Heartburn: Secondary | ICD-10-CM

## 2023-05-06 LAB — BASIC METABOLIC PANEL
Anion gap: 14 (ref 5–15)
BUN: 16 mg/dL (ref 6–20)
CO2: 27 mmol/L (ref 22–32)
Calcium: 9.7 mg/dL (ref 8.9–10.3)
Chloride: 94 mmol/L — ABNORMAL LOW (ref 98–111)
Creatinine, Ser: 1.17 mg/dL (ref 0.61–1.24)
GFR, Estimated: >60 mL/min (ref >60)
Glucose, Bld: 429 mg/dL — ABNORMAL HIGH (ref 70–99)
Potassium: 4.6 mmol/L (ref 3.5–5.1)
Sodium: 135 mmol/L (ref 135–145)

## 2023-05-06 LAB — CBC WITH DIFFERENTIAL/PLATELET
Abs Immature Granulocytes: 0.01 10*3/uL (ref 0.00–0.07)
Basophils Absolute: 0 10*3/uL (ref 0.0–0.1)
Basophils Relative: 1 %
Eosinophils Absolute: 0.1 10*3/uL (ref 0.0–0.5)
Eosinophils Relative: 2 %
HCT: 43.9 % (ref 39.0–52.0)
Hemoglobin: 14.3 g/dL (ref 13.0–17.0)
Immature Granulocytes: 0 %
Lymphocytes Relative: 32 %
Lymphs Abs: 1.9 10*3/uL (ref 0.7–4.0)
MCH: 26 pg (ref 26.0–34.0)
MCHC: 32.6 g/dL (ref 30.0–36.0)
MCV: 79.8 fL — ABNORMAL LOW (ref 80.0–100.0)
Monocytes Absolute: 0.5 10*3/uL (ref 0.1–1.0)
Monocytes Relative: 9 %
Neutro Abs: 3.3 10*3/uL (ref 1.7–7.7)
Neutrophils Relative %: 56 %
Platelets: 279 10*3/uL (ref 150–400)
RBC: 5.5 MIL/uL (ref 4.22–5.81)
RDW: 14.3 % (ref 11.5–15.5)
WBC: 5.9 10*3/uL (ref 4.0–10.5)
nRBC: 0 % (ref 0.0–0.2)

## 2023-05-06 LAB — SARS CORONAVIRUS 2 BY RT PCR: SARS Coronavirus 2 by RT PCR: NEGATIVE

## 2023-05-06 LAB — TROPONIN I (HIGH SENSITIVITY)
Troponin I (High Sensitivity): 7 ng/L (ref <18)
Troponin I (High Sensitivity): 7 ng/L (ref <18)

## 2023-05-06 LAB — CBG MONITORING, ED: Glucose-Capillary: 251 mg/dL — ABNORMAL HIGH (ref 70–99)

## 2023-05-06 MED ORDER — KETOROLAC TROMETHAMINE 30 MG/ML IJ SOLN
30.0000 mg | Freq: Once | INTRAMUSCULAR | Status: AC
  Administered 2023-05-06: 30 mg via INTRAVENOUS
  Filled 2023-05-06: qty 1

## 2023-05-06 MED ORDER — INSULIN ASPART 100 UNIT/ML IJ SOLN
6.0000 [IU] | Freq: Once | INTRAMUSCULAR | Status: AC
  Administered 2023-05-06: 6 [IU] via SUBCUTANEOUS

## 2023-05-06 MED ORDER — SODIUM CHLORIDE 0.9 % IV BOLUS
1000.0000 mL | Freq: Once | INTRAVENOUS | Status: AC
  Administered 2023-05-06: 1000 mL via INTRAVENOUS

## 2023-05-06 NOTE — Discharge Instructions (Addendum)
Your workup in the emergency department does not show a clear cause of your symptoms today.  It is possible that you are experiencing a virus.  Viruses typically can last 5 to 7 days, and can cause headaches, fast heart rate, fevers, muscle aches, coughing, and other symptoms.  I recommend that you use over-the-counter medications like ibuprofen and Tylenol as needed for the virus.  Please be aware that ibuprofen can upset your stomach and worsen your reflux.  Please rest over the weekend, stay hydrated with water, and contact your primary care provider's office to schedule follow-up appointment early next week to recheck your blood sugar levels as well as your symptoms.  *  Your COVID test was negative today.  Your x-ray did not show signs of pneumonia.    Your blood sugar levels were high at 429 initially.  You were given IV fluids and some insulin.  *  Please return to the emergency department if you have new or worsening chest pain, lightheadedness, difficulty breathing, strokelike symptoms, or any other emergency medical concerns.

## 2023-05-06 NOTE — ED Provider Notes (Signed)
[name] Provider Note   CSN: [id number] Arrival date & time: 05/06/23  2019     History {Add pertinent medical, surgical, social history, OB history to HPI:1} Chief Complaint  Patient presents with   Heartburn    Marc Aguilar is a 62 y.o. male with history of heartburn, presented to ED with several concerns.  Patient reports that he has noted headache for about 2 days, feeling unwell, sweaty, also reporting significant acid reflux which she has at baseline, and also a cough ongoing for the past several days.  He feels that there is some acute on chronic pain in his neck where he reports he has bulging disc.  He also feels some stuffiness in his ears.  He denies history of MI or coronary disease.  He does have a history of diabetes and high blood pressure.  He does not smoke  HPI     Home Medications Prior to Admission medications   Medication Sig Start Date End Date Taking? Authorizing Provider  acetaminophen (TYLENOL) 500 MG tablet Take 2 tablets (1,000 mg total) by mouth every 8 (eight) hours. Patient taking differently: Take 1,000 mg by mouth every 4 (four) hours as needed for mild pain. 05/21/22 05/21/23  Signa Kell, MD  albuterol (VENTOLIN HFA) 108 (90 Base) MCG/ACT inhaler Inhale 2 puffs into the lungs every 6 (six) hours as needed for wheezing or shortness of breath. 03/26/19   Lorre Munroe, NP  Alcohol Swabs (ALCOHOL PREP) PADS 1 each by Does not apply route 2 (two) times daily. 02/01/17   Lorre Munroe, NP  amLODipine (NORVASC) 10 MG tablet Take 1 tablet (10 mg total) by mouth daily. 03/18/22   Lorre Munroe, NP  atorvastatin (LIPITOR) 20 MG tablet Take 1 tablet (20 mg total) by mouth daily. Patient taking differently: Take 20 mg by mouth at bedtime. 05/13/21   Eulis Foster, FNP  buPROPion (WELLBUTRIN XL) 300 MG 24 hr tablet Take 1 tablet (300 mg total) by mouth daily. 06/15/22   Lorre Munroe, NP  citalopram (CELEXA) 40  MG tablet TAKE 1 TABLET (40 MG TOTAL) BY MOUTH DAILY. 11/23/22   Lorre Munroe, NP  ezetimibe (ZETIA) 10 MG tablet Take 1 tablet (10 mg total) by mouth daily. 06/15/22   Lorre Munroe, NP  glipiZIDE (GLIPIZIDE XL) 5 MG 24 hr tablet Take 1 tablet (5 mg total) by mouth daily with breakfast. 10/05/22   Lorre Munroe, NP  Lancets MISC 1 each by Does not apply route in the morning and at bedtime. 02/06/20   Lorre Munroe, NP  pantoprazole (PROTONIX) 40 MG tablet Take 1 tablet (40 mg total) by mouth 2 (two) times daily before a meal. 02/17/23   [name], MD  pantoprazole (PROTONIX) 40 MG tablet Take 1 tablet (40 mg total) by mouth daily. 04/18/23 04/17/24  [name], MD  tadalafil (CIALIS) 20 MG tablet Take 1 tablet (20 mg total) by mouth daily as needed for erectile dysfunction. 07/27/22   Vanna Scotland, MD  traZODone (DESYREL) 100 MG tablet Take 2 tablets (200 mg total) by mouth at bedtime. 03/28/23   Lorre Munroe, NP  traZODone (DESYREL) 50 MG tablet Take 1 tablet (50 mg total) by mouth at bedtime as needed for sleep. Patient not taking: Reported on 04/11/2023 10/05/22   Lorre Munroe, NP      Allergies    Tape    Review of Systems  Review of Systems  Physical Exam Updated Vital Signs BP (!) 138/90   Pulse 99   Temp 98.2 F (36.8 C) (Oral)   Resp 19   SpO2 99%  Physical Exam Constitutional:      General: He is not in acute distress. HENT:     Head: Normocephalic and atraumatic.  Eyes:     Conjunctiva/sclera: Conjunctivae normal.     Pupils: Pupils are equal, round, and reactive to light.  Cardiovascular:     Rate and Rhythm: Regular rhythm. Tachycardia present.  Pulmonary:     Effort: Pulmonary effort is normal. No respiratory distress.  Abdominal:     General: There is no distension.     Tenderness: There is no abdominal tenderness.  Skin:    General: Skin is warm and dry.  Neurological:     General: No focal deficit present.     Mental Status: He is alert.  Mental status is at baseline.  Psychiatric:        Mood and Affect: Mood normal.        Behavior: Behavior normal.     ED Results / Procedures / Treatments   Labs (all labs ordered are listed, but only abnormal results are displayed) Labs Reviewed  BASIC METABOLIC PANEL - Abnormal; Notable for the following components:      Result Value   Chloride 94 (*)    Glucose, Bld 429 (*)    All other components within normal limits  CBC WITH DIFFERENTIAL/PLATELET - Abnormal; Notable for the following components:   MCV 79.8 (*)    All other components within normal limits  SARS CORONAVIRUS 2 BY RT PCR  CBG MONITORING, ED  TROPONIN I (HIGH SENSITIVITY)  TROPONIN I (HIGH SENSITIVITY)    EKG None  Radiology DG Chest 2 View  Result Date: 05/06/2023 CLINICAL DATA:  cough, SOB, PNA evaluation EXAM: CHEST - 2 VIEW COMPARISON:  Chest x-ray 09/26/2020 FINDINGS: The heart and mediastinal contours are within normal limits. No focal consolidation. No pulmonary edema. No pleural effusion. No pneumothorax. No acute osseous abnormality. IMPRESSION: No active cardiopulmonary disease. Electronically Signed   By: Tish Frederickson M.D.   On: 05/06/2023 22:43    Procedures Procedures  {Document cardiac monitor, telemetry assessment procedure when appropriate:1}  Medications Ordered in ED Medications  sodium chloride 0.9 % bolus 1,000 mL (0 mLs Intravenous Stopped 05/06/23 2306)  ketorolac (TORADOL) 30 MG/ML injection 30 mg (30 mg Intravenous Given 05/06/23 2316)  insulin aspart (novoLOG) injection 6 Units (6 Units Subcutaneous Given 05/06/23 2327)    ED Course/ Medical Decision Making/ A&P Clinical Course as of 05/06/23 2350  Fri May 06, 2023  2307 Patient's initial ECG did not appear to be crossing over onto epic, but shows sinus tachycardia with HR 130, Qtc 432, nonspecific T wave inversions in lead III, no STEMI or acute ischemic findings.  Subsequent HR improved with IV fluids to the 90's, which  patient reports is close to his baseline heart rate. [MT]  2323 Pt still belching, feeling indigestion, having minor headache [MT]  2323 Pending 2nd troponin level. Discussed glucose elevation - he is diabetic on oral meds.  He will be monitoring this closer at home.  If repeat troponin is unremarkable, I anticipate discharge home for likely viral URI, and PCP follow up. [MT]    Clinical Course User Index [MT] Sirr Kabel, Kermit Balo, MD   {   Click here for ABCD2, HEART and other calculatorsREFRESH Note before signing :1}  Medical Decision Making Amount and/or Complexity of Data Reviewed Labs: ordered. Radiology: ordered. ECG/medicine tests: ordered.  Risk Prescription drug management.   This patient presents to the ED with concern for acid reflux, cough, tachycardia, subjective fevers, feeling unwell. This involves an extensive number of treatment options, and is a complaint that carries with it a high risk of complications and morbidity.  The differential diagnosis includes lung infection versus atypical ACS versus viral illness versus pneumonia versus other  I ordered and personally interpreted labs.  The pertinent results include: Hyperglycemia with glucose 429, no anion gap.  Troponin unremarkable.  No leukocytosis, no acute anemia.  COVID test negative  I ordered imaging studies including x-ray of the chest I independently visualized and interpreted imaging which showed no emergent findings I agree with the radiologist interpretation  The patient was maintained on a cardiac monitor.  I personally viewed and interpreted the cardiac monitored which showed an underlying rhythm of: Sinus tachycardia and subsequently sinus rhythm  Per my interpretation the patient's ECG shows sinus tachycardia, see ED course  I ordered medication including IV fluids for tachycardia, IV Toradol for headache, IV insulin for hyperglycemia  I have reviewed the patients home medicines  and have made adjustments as needed  Test Considered: Lower suspicion for acute PE -no hypoxia, no acute risk factors.  Denies indication for CT imaging of the chest.  Low suspicion also for meningitis or spinal cord infection.  No indication for MRI imaging or LP at this time  After the interventions noted above, I reevaluated the patient and found that they have: improved   Dispostion:  After consideration of the diagnostic results and the patients response to treatment, I feel that the patent would benefit from close outpatient follow-up.  Overall I suspect that his presentation is most consistent with a viral syndrome, and his chest discomfort does appear highly consistent with reflux, which he suffers from already.   {Document critical care time when appropriate:1} {Document review of labs and clinical decision tools ie heart score, Chads2Vasc2 etc:1}  {Document your independent review of radiology images, and any outside records:1} {Document your discussion with family members, caretakers, and with consultants:1} {Document social determinants of health affecting pt's care:1} {Document your decision making why or why not admission, treatments were needed:1} Final Clinical Impression(s) / ED Diagnoses Final diagnoses:  Gastroesophageal reflux disease, unspecified whether esophagitis present  Heartburn  Hyperglycemia  Nonintractable headache, unspecified chronicity pattern, unspecified headache type    Rx / DC Orders ED Discharge Orders     None

## 2023-05-06 NOTE — ED Triage Notes (Signed)
Pt is coming in for heartburn hat started this afternoon that is accompanied with a headache aswell. He noticed on his apple watch his heart rate was in the 120s. Also expresses that he has feels generally unwell/hot/and sweaty. He also had PT on his neck, to relieve stenosis and some disc that are pressing on his spinal cord.

## 2023-05-17 DIAGNOSIS — M5412 Radiculopathy, cervical region: Secondary | ICD-10-CM | POA: Diagnosis not present

## 2023-05-17 DIAGNOSIS — M6281 Muscle weakness (generalized): Secondary | ICD-10-CM | POA: Diagnosis not present

## 2023-06-15 DIAGNOSIS — F331 Major depressive disorder, recurrent, moderate: Secondary | ICD-10-CM | POA: Diagnosis not present

## 2023-06-18 ENCOUNTER — Other Ambulatory Visit: Payer: Self-pay | Admitting: Internal Medicine

## 2023-06-18 ENCOUNTER — Other Ambulatory Visit: Payer: Self-pay | Admitting: Family

## 2023-06-18 ENCOUNTER — Other Ambulatory Visit: Payer: Self-pay

## 2023-06-18 DIAGNOSIS — E1165 Type 2 diabetes mellitus with hyperglycemia: Secondary | ICD-10-CM

## 2023-06-18 DIAGNOSIS — Z0001 Encounter for general adult medical examination with abnormal findings: Secondary | ICD-10-CM

## 2023-06-21 ENCOUNTER — Other Ambulatory Visit: Payer: Self-pay

## 2023-06-21 ENCOUNTER — Encounter: Payer: Self-pay | Admitting: Pharmacist

## 2023-06-21 ENCOUNTER — Other Ambulatory Visit (HOSPITAL_COMMUNITY): Payer: Self-pay

## 2023-06-21 MED ORDER — CITALOPRAM HYDROBROMIDE 40 MG PO TABS
40.0000 mg | ORAL_TABLET | Freq: Every day | ORAL | 0 refills | Status: DC
  Filled 2023-06-21 – 2023-07-08 (×2): qty 90, 90d supply, fill #0

## 2023-06-21 MED ORDER — EZETIMIBE 10 MG PO TABS
10.0000 mg | ORAL_TABLET | Freq: Every day | ORAL | 0 refills | Status: DC
  Filled 2023-06-21 – 2023-07-08 (×3): qty 90, 90d supply, fill #0

## 2023-06-21 MED ORDER — TRAZODONE HCL 100 MG PO TABS
200.0000 mg | ORAL_TABLET | Freq: Every day | ORAL | 0 refills | Status: DC
Start: 2023-06-21 — End: 2023-09-22
  Filled 2023-06-21 – 2023-07-08 (×2): qty 60, 30d supply, fill #0

## 2023-06-21 MED ORDER — GLIPIZIDE ER 5 MG PO TB24
5.0000 mg | ORAL_TABLET | Freq: Every day | ORAL | 0 refills | Status: DC
Start: 2023-06-21 — End: 2023-06-28
  Filled 2023-06-21: qty 90, 90d supply, fill #0

## 2023-06-21 MED ORDER — AMLODIPINE BESYLATE 10 MG PO TABS
10.0000 mg | ORAL_TABLET | Freq: Every day | ORAL | 0 refills | Status: DC
  Filled 2023-06-21 – 2023-07-08 (×2): qty 90, 90d supply, fill #0

## 2023-06-21 MED ORDER — BUPROPION HCL ER (XL) 300 MG PO TB24
300.0000 mg | ORAL_TABLET | Freq: Every day | ORAL | 0 refills | Status: DC
  Filled 2023-06-21 – 2023-07-08 (×2): qty 90, 90d supply, fill #0

## 2023-06-21 NOTE — Telephone Encounter (Signed)
Appt 06/28/23 Requested Prescriptions  Pending Prescriptions Disp Refills   amLODipine (NORVASC) 10 MG tablet 90 tablet 1    Sig: Take 1 tablet (10 mg total) by mouth daily.     Cardiovascular: Calcium Channel Blockers 2 Failed - 06/18/2023  1:10 AM      Failed - Last BP in normal range    BP Readings from Last 1 Encounters:  04/18/23 (!) 147/94         Passed - Last Heart Rate in normal range    Pulse Readings from Last 1 Encounters:  04/18/23 74         Passed - Valid encounter within last 6 months    Recent Outpatient Visits           2 months ago Encounter for general adult medical examination with abnormal findings   [place] [facility] [place], Salvadore Oxford, NP   7 months ago Chronic nausea   [facility] [place], [place] W, NP   8 months ago Type 2 diabetes mellitus with hyperglycemia, without long-term current use of insulin [facility])   [place] [facility] [place], [place] W, NP   1 year ago Type 2 diabetes mellitus with hyperglycemia, without long-term current use of insulin [facility])   [place] [facility] [place], Salvadore Oxford, NP   1 year ago Herpes zoster without complication   [place] [place], Salvadore Oxford, NP       Future Appointments             In 1 week Sampson Si, Salvadore Oxford, NP [place] [facility], [facility]   In 1 week Vanna Scotland, MD [facility] [place]             buPROPion (WELLBUTRIN XL) 300 MG 24 hr tablet 90 tablet 0    Sig: Take 1 tablet (300 mg total) by mouth daily.     Psychiatry: Antidepressants - bupropion Failed - 06/18/2023  1:10 AM      Failed - Last BP in normal range    BP Readings from Last 1 Encounters:  04/18/23 (!) 147/94         Passed - Cr in normal range and within 360 days    Creat  Date Value Ref Range Status  03/28/2023 0.98 0.70 - 1.30 mg/dL Final   Creatinine, Ser  Date Value Ref Range Status   05/06/2023 1.17 0.61 - 1.24 mg/dL Final   Creatinine,U  Date Value Ref Range Status  11/14/2018 183.0 mg/dL Final   Creatinine, Urine  Date Value Ref Range Status  10/05/2022 61 20 - 320 mg/dL Final         Passed - AST in normal range and within 360 days    AST  Date Value Ref Range Status  03/28/2023 16 10 - 35 U/L Final         Passed - ALT in normal range and within 360 days    ALT  Date Value Ref Range Status  03/28/2023 15 9 - 46 U/L Final         Passed - Completed PHQ-2 or PHQ-9 in the last 360 days      Passed - Valid encounter within last 6 months    Recent Outpatient Visits           2 months ago Encounter for general adult medical examination with abnormal findings   [place]  [facility] [place], Salvadore Oxford, NP   7 months ago Chronic nausea   [place] [facility] [place], [place], NP   8 months ago Type 2 diabetes mellitus with hyperglycemia, without long-term current use of insulin [facility])   [place] [facility] [place], [place], NP   1 year ago Type 2 diabetes mellitus with hyperglycemia, without long-term current use of insulin [facility])   [facility] [place], Salvadore Oxford, NP   1 year ago Herpes zoster without complication   [place] [facility] [place], Salvadore Oxford, NP       Future Appointments             In 1 week Sampson Si, Salvadore Oxford, NP [place] [facility], [place]   In 1 week Vanna Scotland, MD [facility] [place]             ezetimibe (ZETIA) 10 MG tablet 90 tablet 0    Sig: Take 1 tablet (10 mg total) by mouth daily.     Cardiovascular:  Antilipid - Sterol Transport Inhibitors Failed - 06/18/2023  1:10 AM      Failed - Lipid Panel in normal range within the last 12 months    Cholesterol, Total  Date Value Ref Range Status  10/26/2019 157 100 - 199 mg/dL Final   Cholesterol  Date Value Ref Range Status  03/28/2023 166 <200  mg/dL Final   LDL Cholesterol (Calc)  Date Value Ref Range Status  03/28/2023 86 mg/dL (calc) Final    Comment:    Reference range: <100 . Desirable range <100 mg/dL for primary prevention;   <70 mg/dL for patients with CHD or diabetic patients  with > or = 2 CHD risk factors. Marland Kitchen LDL-C is now calculated using the Martin-Hopkins  calculation, which is a validated novel method providing  better accuracy than the Friedewald equation in the  estimation of LDL-C.  Horald Pollen et al. Lenox Ahr. 4098;119(14): 2061-2068  (http://education.Quest[employer].com/faq/FAQ164)    Direct LDL  Date Value Ref Range Status  01/01/2021 127.0 mg/dL Final    Comment:    Optimal:  <100 mg/dLNear or Above Optimal:  100-129 mg/dLBorderline High:  130-159 mg/dLHigh:  160-189 mg/dLVery High:  >190 mg/dL   HDL  Date Value Ref Range Status  03/28/2023 52 > OR = 40 mg/dL Final  78/29/5621 48 >30 mg/dL Final   Triglycerides  Date Value Ref Range Status  03/28/2023 183 (H) <150 mg/dL Final         Passed - AST in normal range and within 360 days    AST  Date Value Ref Range Status  03/28/2023 16 10 - 35 U/L Final         Passed - ALT in normal range and within 360 days    ALT  Date Value Ref Range Status  03/28/2023 15 9 - 46 U/L Final         Passed - Patient is not pregnant      Passed - Valid encounter within last 12 months    Recent Outpatient Visits           2 months ago Encounter for general adult medical examination with abnormal findings   [place] [facility] [place], Salvadore Oxford, NP   7 months ago Chronic nausea   [place] [facility] [place], Salvadore Oxford, NP   8 months ago Type 2  diabetes mellitus with hyperglycemia, without long-term current use of insulin [facility])   [place] [facility] [place], [place], NP   1 year ago Type 2 diabetes mellitus with hyperglycemia, without long-term current use of insulin [facility])   [place] [facility] [place], [place] W, NP   1 year ago Herpes zoster without complication   [place] [facility] [place], Salvadore Oxford, NP       Future Appointments             In 1 week Sampson Si, Salvadore Oxford, NP [place] [facility], [place]   In 1 week Vanna Scotland, MD [facility] [place]             glipiZIDE (GLIPIZIDE XL) 5 MG 24 hr tablet 90 tablet 0    Sig: Take 1 tablet (5 mg total) by mouth daily with breakfast.     Endocrinology:  Diabetes - Sulfonylureas Passed - 06/18/2023  1:10 AM      Passed - HBA1C is between 0 and 7.9 and within 180 days    Hgb A1c MFr Bld  Date Value Ref Range Status  03/28/2023 6.9 (H) <5.7 % of total Hgb Final    Comment:    For someone without known diabetes, a hemoglobin A1c value of 6.5% or greater indicates that they may have  diabetes and this should be confirmed with a follow-up  test. . For someone with known diabetes, a value <7% indicates  that their diabetes is well controlled and a value  greater than or equal to 7% indicates suboptimal  control. A1c targets should be individualized based on  duration of diabetes, age, comorbid conditions, and  other considerations. . Currently, no consensus exists regarding use of hemoglobin A1c for diagnosis of diabetes for children. .          Passed - Cr in normal range and within 360 days    Creat  Date Value Ref Range Status  03/28/2023 0.98 0.70 - 1.30 mg/dL Final   Creatinine, Ser  Date Value Ref Range Status  05/06/2023 1.17 0.61 - 1.24 mg/dL Final   Creatinine,U  Date Value Ref Range Status  11/14/2018 183.0 mg/dL Final   Creatinine, Urine  Date Value Ref Range Status  10/05/2022 61 20 - 320 mg/dL Final         Passed - Valid encounter within last 6 months    Recent Outpatient Visits           2 months ago Encounter for general adult medical examination with abnormal findings   [place] [facility] [place], Salvadore Oxford, NP   7 months ago Chronic nausea   [place] [facility] [place], [place], NP   8 months ago Type 2 diabetes mellitus with hyperglycemia, without long-term current use of insulin [facility])   [place] [facility] [place], [place], NP   1 year ago Type 2 diabetes mellitus with hyperglycemia, without long-term current use of insulin [facility])   [place] [facility] [place], Salvadore Oxford, NP   1 year ago Herpes zoster without complication   [place] [facility] [place], Salvadore Oxford, NP       Future Appointments             In 1 week Sampson Si, Salvadore Oxford, NP [place] [facility]   In 1 week  Vanna Scotland, MD [facility] [place]             citalopram (CELEXA) 40 MG tablet 90 tablet 0    Sig: TAKE 1 TABLET (40 MG TOTAL) BY MOUTH DAILY.     Psychiatry:  Antidepressants - SSRI Passed - 06/18/2023  1:10 AM      Passed - Completed PHQ-2 or PHQ-9 in the last 360 days      Passed - Valid encounter within last 6 months    Recent Outpatient Visits           2 months ago Encounter for general adult medical examination with abnormal findings   [place] [facility] [place], Salvadore Oxford, NP   7 months ago Chronic nausea   [place] [facility] [place], [place], NP   8 months ago Type 2 diabetes mellitus with hyperglycemia, without long-term current use of insulin [facility])   [place] [facility] [place], [place], NP   1 year ago Type 2 diabetes mellitus with hyperglycemia, without long-term current use of insulin [facility])   [place] [facility] [place], Salvadore Oxford, NP   1 year ago Herpes zoster without complication   [place] [facility] [place], Salvadore Oxford, NP       Future Appointments             In 1 week Sampson Si, Salvadore Oxford, NP [place] [facility], [facility]   In 1 week Vanna Scotland, MD [facility]  [place]             traZODone (DESYREL) 100 MG tablet 60 tablet 0    Sig: Take 2 tablets (200 mg total) by mouth at bedtime.     Psychiatry: Antidepressants - Serotonin Modulator Passed - 06/18/2023  1:10 AM      Passed - Completed PHQ-2 or PHQ-9 in the last 360 days      Passed - Valid encounter within last 6 months    Recent Outpatient Visits           2 months ago Encounter for general adult medical examination with abnormal findings   [place] [facility] [place], Salvadore Oxford, NP   7 months ago Chronic nausea   [place] [facility] [place], [place], NP   8 months ago Type 2 diabetes mellitus with hyperglycemia, without long-term current use of insulin [facility])   [place] [facility] [place], [place] W, NP   1 year ago Type 2 diabetes mellitus with hyperglycemia, without long-term current use of insulin [facility])   [place] [facility] [place], Salvadore Oxford, NP   1 year ago Herpes zoster without complication   [place] [facility] [place], Salvadore Oxford, NP       Future Appointments             In 1 week Sampson Si, Salvadore Oxford, NP [place] [facility], [facility]   In 1 week Vanna Scotland, MD [facility]

## 2023-06-22 DIAGNOSIS — F331 Major depressive disorder, recurrent, moderate: Secondary | ICD-10-CM | POA: Diagnosis not present

## 2023-06-24 ENCOUNTER — Other Ambulatory Visit: Payer: Self-pay

## 2023-06-27 ENCOUNTER — Other Ambulatory Visit: Payer: 59

## 2023-06-27 DIAGNOSIS — C61 Malignant neoplasm of prostate: Secondary | ICD-10-CM

## 2023-06-28 ENCOUNTER — Ambulatory Visit
Admission: RE | Admit: 2023-06-28 | Discharge: 2023-06-28 | Disposition: A | Discharge status: Home / Self Care | Payer: 59 | Source: Ambulatory Visit | Attending: Internal Medicine | Admitting: Internal Medicine

## 2023-06-28 ENCOUNTER — Ambulatory Visit (INDEPENDENT_AMBULATORY_CARE_PROVIDER_SITE_OTHER): Payer: 59 | Admitting: Internal Medicine

## 2023-06-28 ENCOUNTER — Encounter: Payer: Self-pay | Admitting: Internal Medicine

## 2023-06-28 ENCOUNTER — Other Ambulatory Visit (HOSPITAL_COMMUNITY): Payer: Self-pay

## 2023-06-28 ENCOUNTER — Ambulatory Visit
Admission: RE | Admit: 2023-06-28 | Discharge: 2023-06-28 | Disposition: A | Discharge status: Home / Self Care | Payer: 59 | Attending: Internal Medicine | Admitting: Internal Medicine

## 2023-06-28 ENCOUNTER — Other Ambulatory Visit: Payer: Self-pay

## 2023-06-28 VITALS — BP 126/80 | HR 100 | Temp 96.8°F | Wt 224.0 lb

## 2023-06-28 DIAGNOSIS — G4701 Insomnia due to medical condition: Secondary | ICD-10-CM

## 2023-06-28 DIAGNOSIS — F32A Depression, unspecified: Secondary | ICD-10-CM

## 2023-06-28 DIAGNOSIS — E1169 Type 2 diabetes mellitus with other specified complication: Secondary | ICD-10-CM | POA: Diagnosis not present

## 2023-06-28 DIAGNOSIS — M150 Primary generalized (osteo)arthritis: Secondary | ICD-10-CM

## 2023-06-28 DIAGNOSIS — I10 Essential (primary) hypertension: Secondary | ICD-10-CM

## 2023-06-28 DIAGNOSIS — Z8546 Personal history of malignant neoplasm of prostate: Secondary | ICD-10-CM

## 2023-06-28 DIAGNOSIS — G8929 Other chronic pain: Secondary | ICD-10-CM | POA: Diagnosis not present

## 2023-06-28 DIAGNOSIS — E785 Hyperlipidemia, unspecified: Secondary | ICD-10-CM

## 2023-06-28 DIAGNOSIS — E6609 Other obesity due to excess calories: Secondary | ICD-10-CM | POA: Diagnosis not present

## 2023-06-28 DIAGNOSIS — K219 Gastro-esophageal reflux disease without esophagitis: Secondary | ICD-10-CM | POA: Diagnosis not present

## 2023-06-28 DIAGNOSIS — E1165 Type 2 diabetes mellitus with hyperglycemia: Secondary | ICD-10-CM

## 2023-06-28 DIAGNOSIS — M25551 Pain in right hip: Secondary | ICD-10-CM

## 2023-06-28 DIAGNOSIS — G43C1 Periodic headache syndromes in child or adult, intractable: Secondary | ICD-10-CM

## 2023-06-28 DIAGNOSIS — F419 Anxiety disorder, unspecified: Secondary | ICD-10-CM | POA: Diagnosis not present

## 2023-06-28 DIAGNOSIS — M5412 Radiculopathy, cervical region: Secondary | ICD-10-CM

## 2023-06-28 DIAGNOSIS — M159 Polyosteoarthritis, unspecified: Secondary | ICD-10-CM

## 2023-06-28 DIAGNOSIS — G4733 Obstructive sleep apnea (adult) (pediatric): Secondary | ICD-10-CM

## 2023-06-28 DIAGNOSIS — Z23 Encounter for immunization: Secondary | ICD-10-CM | POA: Diagnosis not present

## 2023-06-28 DIAGNOSIS — Z6831 Body mass index (BMI) 31.0-31.9, adult: Secondary | ICD-10-CM

## 2023-06-28 DIAGNOSIS — M160 Bilateral primary osteoarthritis of hip: Secondary | ICD-10-CM | POA: Diagnosis not present

## 2023-06-28 LAB — POCT GLYCOSYLATED HEMOGLOBIN (HGB A1C): Hemoglobin A1C: 8.6 % — AB (ref 4.0–5.6)

## 2023-06-28 LAB — PSA: Prostate Specific Ag, Serum: 0.2 ng/mL (ref 0.0–4.0)

## 2023-06-28 MED ORDER — GLIPIZIDE ER 10 MG PO TB24
10.0000 mg | ORAL_TABLET | Freq: Every day | ORAL | 0 refills | Status: DC
  Filled 2023-06-28 (×2): qty 90, 90d supply, fill #0

## 2023-06-28 NOTE — Assessment & Plan Note (Signed)
Encourage weight loss as this can help reduce reflux symptoms Continue pantoprazole 

## 2023-06-28 NOTE — Assessment & Plan Note (Signed)
Following with urology

## 2023-06-28 NOTE — Assessment & Plan Note (Signed)
Continue ibuprofen and tylenol He will continue to follow with neurosurgery

## 2023-06-28 NOTE — Patient Instructions (Signed)

## 2023-06-28 NOTE — Assessment & Plan Note (Signed)
PCOT A1C today 8.6% Will check urine microalbumin Encouraged low carb diet and exercise for weight loss Increase glipizide to 10 mg XL daily Encouraged routine eye exam Encouraged routine foot exam Flu shot today

## 2023-06-28 NOTE — Progress Notes (Signed)
Subjective:    Patient ID: Marc Aguilar, male    DOB: 1961/07/14, 62 y.o.   MRN: [id number]  HPI  Patient presents to clinic today for 63-month follow-up of chronic conditions.  Migraines: These occur rarely.  Triggered by neck pain.  He takes Excedrin Migraine as needed with some relief of symptoms.  He does not follow with neurology.  OSA: He averages 4 to 5 hours per night with the use of his CPAP.  Sleep study from 12/2016 reviewed.  HLD: His last LDL was, triglycerides.  He denies myalgias on atorvastatin and ezetimibe.  He does not consume a low-fat diet.  HTN: His BP today is 126/80.  He is taking amlodipine as prescribed.  ECG from 07/2021 reviewed.  DM2: His last A1c was.  His sugars range 268-401.  He is taking glipizide as prescribed but admits he ran out about 1 week ago.  He checks his feet routinely.  His last eye exam was 06/2023.  Flu 07/2022.  Pneumovax 10/2018.  COVID Pfizer x 3.  Anxiety and depression: Chronic, managed on citalopram and bupropion.  He is not currently seeing a therapist.  He denies SI/HI.  GERD: He is not sure what triggers this.  He denies breakthrough on pantoprazole.  Upper GI from 04/2023 reviewed.  Insomnia: He has difficulty falling and staying asleep.  He is taking trazodone as needed with good results.  Sleep study from 12/2016 reviewed.  Cervical radiculitis: MRI cervical spine from 02/2022 reviewed. He is taking ibuprofen and tylenol OTC with minimal relief of symptoms.  He follows with neurosurgery.  Chronic right shoulder pain: MRI from 02/2022 reviewed. He is taking ibuprofen and tylenol OTC with minimal relief of symptoms.  He follows with orthopedics.  Review of Systems     Past Medical History:  Diagnosis Date   Anxiety    Arthritis    "knees and hips" (02/28/2018)   Cancer (HCC)    prostate    Chicken pox    Depression    GERD (gastroesophageal reflux disease)    Heart murmur    Hernia, abdominal    High cholesterol     History of gout    Hypertension    Migraine    "probably monthly" (02/28/2018)   Motion sickness    ocean boats, circular motion   Neck pain    OSA on CPAP    Pneumonia    "once" (02/28/2018)   Refusal of blood transfusions as patient is Jehovah's Witness    Seasonal allergies    Type II diabetes mellitus (HCC)     Current Outpatient Medications  Medication Sig Dispense Refill   albuterol (VENTOLIN HFA) 108 (90 Base) MCG/ACT inhaler Inhale 2 puffs into the lungs every 6 (six) hours as needed for wheezing or shortness of breath. 1 Inhaler 0   Alcohol Swabs (ALCOHOL PREP) PADS 1 each by Does not apply route 2 (two) times daily. 200 each 3   amLODipine (NORVASC) 10 MG tablet Take 1 tablet (10 mg total) by mouth daily. 90 tablet 0   atorvastatin (LIPITOR) 20 MG tablet Take 1 tablet (20 mg total) by mouth daily. (Patient taking differently: Take 20 mg by mouth at bedtime.) 90 tablet 0   buPROPion (WELLBUTRIN XL) 300 MG 24 hr tablet Take 1 tablet (300 mg total) by mouth daily. 90 tablet 0   citalopram (CELEXA) 40 MG tablet Take 1 tablet (40 mg total) by mouth daily. 90 tablet 0   ezetimibe (ZETIA) 10  MG tablet Take 1 tablet (10 mg total) by mouth daily. 90 tablet 0   glipiZIDE (GLIPIZIDE XL) 5 MG 24 hr tablet Take 1 tablet (5 mg total) by mouth daily with breakfast. 90 tablet 0   Lancets MISC 1 each by Does not apply route in the morning and at bedtime. 200 each 2   pantoprazole (PROTONIX) 40 MG tablet Take 1 tablet (40 mg total) by mouth 2 (two) times daily before a meal. 60 tablet 5   pantoprazole (PROTONIX) 40 MG tablet Take 1 tablet (40 mg total) by mouth daily. 30 tablet 11   tadalafil (CIALIS) 20 MG tablet Take 1 tablet (20 mg total) by mouth daily as needed for erectile dysfunction. 10 tablet 6   traZODone (DESYREL) 100 MG tablet Take 2 tablets (200 mg total) by mouth at bedtime. 60 tablet 0   traZODone (DESYREL) 50 MG tablet Take 1 tablet (50 mg total) by mouth at bedtime as needed for  sleep. (Patient not taking: Reported on 04/11/2023) 90 tablet 0   No current facility-administered medications for this visit.    Allergies  Allergen Reactions   Tape Rash    Paper tape-blisters    Family History  Problem Relation Age of Onset   Diabetes Mother    Hyperlipidemia Mother    Bladder Cancer Father    Liver cancer Sister    Diabetes Sister    Diabetes Maternal Aunt    Arthritis Maternal Grandmother    Diabetes Maternal Grandmother    Arthritis Maternal Grandfather    Arthritis Paternal Grandmother    Diabetes Brother    Heart disease Brother    Colon cancer Neg Hx     Social History   Socioeconomic History   Marital status: Married    Spouse name: Not on file   Number of children: 2   Years of education: Not on file   Highest education level: Bachelor's degree (e.g., BA, AB, BS)  Occupational History   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  Tobacco Use   Smoking status: Former    Current packs/day: 0.00    Average packs/day: 1 pack/day for 27.0 years (27.0 ttl pk-yrs)    Types: Cigarettes    Start date: 10/18/1982    Quit date: 10/18/2009    Years since quitting: 13.7   Smokeless tobacco: Former    Types: Chew    Quit date: 10/18/1994  Vaping Use   Vaping status: Never Used  Substance and Sexual Activity   Alcohol use: Yes    Alcohol/week: 21.0 standard drinks of alcohol    Types: 21 Cans of beer per week   Drug use: Never   Sexual activity: Not on file  Other Topics Concern   Not on file  Social History Narrative   Not on file   Social Determinants of Health   Financial Resource Strain: Low Risk  (03/28/2023)   Overall Financial Resource Strain (CARDIA)    Difficulty of Paying Living Expenses: Not hard at all  Food Insecurity: Food Insecurity Present (03/28/2023)   Hunger Vital Sign    Worried About Running Out of Food in the Last Year: Sometimes true    Ran Out of Food in the Last Year: Sometimes true  Transportation Needs: No Transportation Needs  (03/28/2023)   PRAPARE - Administrator, Civil Service (Medical): No    Lack of Transportation (Non-Medical): No  Physical Activity: Sufficiently Active (03/28/2023)   Exercise Vital Sign    Days of Exercise per  Week: 1 day    Minutes of Exercise per Session: 150+ min  Stress: Stress Concern Present (03/28/2023)   [employer] of Occupational Health - Occupational Stress Questionnaire    Feeling of Stress : Very much  Social Connections: Moderately Isolated (03/28/2023)   Social Connection and Isolation Panel [NHANES]    Frequency of Communication with Friends and Family: Once a week    Frequency of Social Gatherings with Friends and Family: Never    Attends Religious Services: More than 4 times per year    Active Member of [employer] or Organizations: No    Attends Engineer, structural: Not on file    Marital Status: Married  Catering manager Violence: Not on file     Constitutional: Patient reports intermittent headaches.  Denies fever, malaise, fatigue, or abrupt weight changes.  HEENT: Denies eye pain, eye redness, ear pain, ringing in the ears, wax buildup, runny nose, nasal congestion, bloody nose, or sore throat. Respiratory: Denies difficulty breathing, shortness of breath, cough or sputum production.   Cardiovascular: Denies chest pain, chest tightness, palpitations or swelling in the hands or feet.  Gastrointestinal: Denies abdominal pain, bloating, constipation, diarrhea or blood in the stool.  GU: Denies urgency, frequency, pain with urination, burning sensation, blood in urine, odor or discharge. Musculoskeletal: Patient reports chronic neck,  right shoulder pain, right hip pain.  Denies decrease in range of motion, difficulty with gait, muscle pain or joint swelling.  Skin: Denies redness, rashes, lesions or ulcercations.  Neurological: Patient reports insomnia, paresthesias of upper extremities.  Denies dizziness, difficulty with memory, difficulty with  speech or problems with balance and coordination.  Psych: Patient has a history of anxiety and depression.  Denies SI/HI.  No other specific complaints in a complete review of systems (except as listed in HPI above).  Objective:   Physical Exam   BP 126/80 (BP Location: Left Arm, Patient Position: Sitting, Cuff Size: Large)   Pulse 100   Temp (!) 96.8 F (36 C) (Temporal)   Wt 224 lb (101.6 kg)   SpO2 99%   BMI 31.24 kg/m   Wt Readings from Last 3 Encounters:  04/18/23 225 lb (102.1 kg)  03/28/23 226 lb (102.5 kg)  03/17/23 220 lb (99.8 kg)    General: Appears his stated age, obese, in NAD. Skin: Warm, dry and intact. No noted. HEENT: Head: normal shape and size; Eyes: sclera white, no icterus, conjunctiva pink, PERRLA and EOMs intact; Cardiovascular: Normal rate and rhythm. S1,S2 noted.  No murmur, rubs or gallops noted. No JVD or BLE edema. No carotid bruits noted. Pulmonary/Chest: Normal effort and positive vesicular breath sounds. No respiratory distress. No wheezes, rales or ronchi noted.  Abdomen: Soft and nontender. Normal bowel sounds.  Musculoskeletal: Normal flexion, extension and rotation of the cervical spine.  No bony tenderness noted over the cervical spine.  Shoulder shrug equal.  Strength 5/5 BUE.  Handgrips equal.  Decreased abduction, internal and external rotation of the right hip.  Normal abduction, flexion and extension of the right hip.  No pain with palpation of the right hip.  Strength 5/5 BLE.  Limping gait. Neurological: Alert and oriented. Cranial nerves II-XII grossly intact. Coordination normal.  Psychiatric: Mood and affect normal. Behavior is normal. Judgment and thought content normal.     BMET    Component Value Date/Time   NA 135 05/06/2023 2049   K 4.6 05/06/2023 2049   CL 94 (L) 05/06/2023 2049   CO2 27 05/06/2023 2049  GLUCOSE 429 (H) 05/06/2023 2049   BUN 16 05/06/2023 2049   CREATININE 1.17 05/06/2023 2049   CREATININE 0.98  03/28/2023 1449   CALCIUM 9.7 05/06/2023 2049   GFRNONAA >60 05/06/2023 2049   GFRAA >60 11/20/2019 0445    Lipid Panel     Component Value Date/Time   CHOL 166 03/28/2023 1449   CHOL 157 10/26/2019 1203   TRIG 183 (H) 03/28/2023 1449   HDL 52 03/28/2023 1449   HDL 48 10/26/2019 1203   CHOLHDL 3.2 03/28/2023 1449   VLDL 60.2 (H) 01/01/2021 1047   LDLCALC 86 03/28/2023 1449    CBC    Component Value Date/Time   WBC 5.9 05/06/2023 2049   RBC 5.50 05/06/2023 2049   HGB 14.3 05/06/2023 2049   HCT 43.9 05/06/2023 2049   PLT 279 05/06/2023 2049   MCV 79.8 (L) 05/06/2023 2049   MCH 26.0 05/06/2023 2049   MCHC 32.6 05/06/2023 2049   RDW 14.3 05/06/2023 2049   LYMPHSABS 1.9 05/06/2023 2049   MONOABS 0.5 05/06/2023 2049   EOSABS 0.1 05/06/2023 2049   BASOSABS 0.0 05/06/2023 2049    Hgb A1C Lab Results  Component Value Date   HGBA1C 6.9 (H) 03/28/2023           Assessment & Plan:   Chronic right hip pain:  X-ray right hip today for further evaluation  RTC in 3 months, follow-up chronic conditions Nicki Reaper, NP

## 2023-06-28 NOTE — Assessment & Plan Note (Signed)
Encourage weight loss as this can help reduce sleep apnea symptoms Continue CPAP 

## 2023-06-28 NOTE — Assessment & Plan Note (Signed)
Continue excedrin migraine as needed

## 2023-06-28 NOTE — Assessment & Plan Note (Signed)
Stable on citalopram and bupropion Support offered

## 2023-06-28 NOTE — Assessment & Plan Note (Addendum)
Controlled on amlodipine Reinforced DASH diet and exercise for weight loss CMET today

## 2023-06-28 NOTE — Assessment & Plan Note (Signed)
Continue trazodone 

## 2023-06-28 NOTE — Assessment & Plan Note (Signed)
Encourage diet and exercise for weight loss 

## 2023-06-28 NOTE — Assessment & Plan Note (Signed)
C-Met and lipid profile today Encourage him to consume a low-fat diet Continue atorvastatin and ezetimibe

## 2023-06-28 NOTE — Assessment & Plan Note (Addendum)
Continue tylenol and ibuprofen OTC Encouraged weight loss as this can help reduce joint pain

## 2023-06-29 ENCOUNTER — Encounter: Payer: Self-pay | Admitting: Urology

## 2023-06-29 ENCOUNTER — Other Ambulatory Visit: Payer: Self-pay

## 2023-06-29 ENCOUNTER — Ambulatory Visit: Payer: 59 | Admitting: Urology

## 2023-06-29 VITALS — BP 153/93 | HR 91 | Ht 71.0 in | Wt 222.4 lb

## 2023-06-29 DIAGNOSIS — C61 Malignant neoplasm of prostate: Secondary | ICD-10-CM | POA: Diagnosis not present

## 2023-06-29 MED ORDER — BLOOD GLUCOSE MONITOR SYSTEM W/DEVICE KIT
PACK | 0 refills | Status: AC
  Filled 2023-06-29: qty 1, 30d supply, fill #0

## 2023-06-29 MED ORDER — BLOOD GLUCOSE TEST VI STRP
ORAL_STRIP | 1 refills | Status: AC
Start: 2023-06-29 — End: ?
  Filled 2023-06-29: qty 100, 90d supply, fill #0

## 2023-06-29 MED ORDER — LANCET DEVICE MISC
0 refills | Status: DC
  Filled 2023-06-29: qty 1, fill #0

## 2023-06-29 MED ORDER — FREESTYLE LANCETS MISC
1 refills | Status: AC
Start: 2023-06-29 — End: ?
  Filled 2023-06-29: qty 100, 90d supply, fill #0

## 2023-06-29 NOTE — Patient Instructions (Signed)
Leuprolide Emulsion for Injection What is this medication? LEUPROLIDE (loo PROE lide) reduces the symptoms of prostate cancer. It works by decreasing levels of the hormone testosterone in the body. This prevents prostate cancer cells from spreading or growing. This medicine may be used for other purposes; ask your health care provider or pharmacist if you have questions. COMMON BRAND NAME(S): CAMCEVI What should I tell my care team before I take this medication? They need to know if you have any of these conditions: Diabetes Heart disease Heart failure High or low levels of electrolytes, such as magnesium, potassium, or sodium in your blood Irregular heartbeat or rhythm Seizures An unusual or allergic reaction to leuprolide, other medications, foods, dyes, or preservatives Pregnant or trying to get pregnant Breastfeeding How should I use this medication? This medication is injected under the skin. It is given by your care team in a hospital or clinic setting. Talk to your care team about the use of this medication in children. Special care may be needed. Overdosage: If you think you have taken too much of this medicine contact a poison control center or emergency room at once. NOTE: This medicine is only for you. Do not share this medicine with others. What if I miss a dose? Keep appointments for follow-up doses. It is important not to miss your dose. Call your care team if you are unable to keep an appointment. What may interact with this medication? Do not take this medication with any of the following: Cisapride Dronedarone Ketoconazole Levoketoconazole Pimozide Thioridazine This medication may also interact with the following: Other medications that cause heart rhythm changes This list may not describe all possible interactions. Give your health care provider a list of all the medicines, herbs, non-prescription drugs, or dietary supplements you use. Also tell them if you smoke,  drink alcohol, or use illegal drugs. Some items may interact with your medicine. What should I watch for while using this medication? Visit your care team for regular checks on your progress. Tell your care team if your symptoms do not start to get better or if they get worse. This medication may increase blood sugar. The risk may be higher in patients who already have diabetes. Ask your care team what you can do to lower the risk of diabetes while taking this medication. This medication may cause infertility. Talk to your care team if you are concerned about your fertility. Heart attacks and strokes have been reported with the use of this medication. Get emergency help if you develop signs or symptoms of a heart attack or stroke. Talk to your care team about the risks and benefits of this medication. What side effects may I notice from receiving this medication? Side effects that you should report to your care team as soon as possible: Allergic reactions--skin rash, itching, hives, swelling of the face, lips, tongue, or throat Heart attack--pain or tightness in the chest, shoulders, arms, or jaw, nausea, shortness of breath, cold or clammy skin, feeling faint or lightheaded Heart rhythm changes--fast or irregular heartbeat, dizziness, feeling faint or lightheaded, chest pain, trouble breathing High blood sugar (hyperglycemia)--increased thirst or amount of urine, unusual weakness or fatigue, blurry vision New or worsening seizures Redness, blistering, peeling, or loosening of the skin, including inside the mouth Stroke--sudden numbness or weakness of the face, arm, or leg, trouble speaking, confusion, trouble walking, loss of balance or coordination, dizziness, severe headache, change in vision Swelling and pain of the tumor site or lymph nodes Side effects that  usually do not require medical attention (report these to your care team if they continue or are bothersome): Change in sex drive or  performance Hot flashes Joint pain Pain, redness, or irritation at injection site Swelling of the ankles, hands, or feet Unusual weakness or fatigue This list may not describe all possible side effects. Call your doctor for medical advice about side effects. You may report side effects to FDA at 1-800-FDA-1088. Where should I keep my medication? This medication is given in a hospital or clinic. It will not be stored at home. NOTE: This sheet is a summary. It may not cover all possible information. If you have questions about this medicine, talk to your doctor, pharmacist, or health care provider.  2024 Elsevier/Gold Standard (2023-02-22 00:00:00)

## 2023-06-29 NOTE — Progress Notes (Signed)
Marcelle Overlie Plume,acting as a scribe for Vanna Scotland, MD.,have documented all relevant documentation on the behalf of Vanna Scotland, MD,as directed by  Vanna Scotland, MD while in the presence of Vanna Scotland, MD.  06/29/2023 10:43 AM   Marc Aguilar 1961-08-18 [id number]  Referring provider: Lorre Munroe, NP [address] [place],  [place] [address]  Chief Complaint  Patient presents with   Follow-up    Psa , follow up 3 month up    HPI: 62 year-old male with personal history of prostate cancer who returns today for routine annual follow-up.   He is s/p robotic assisted laparoscopic radical prostatectomy on 11/2019. Surgical pathology revealed Gleason 4+3 with > 5% of tertiary pattern 5 + EFE nonfocal - SB+ margin on the left posterior, neg lymph nodes; pT3a pN0.   His PSA had remained undetectable but in 01/2023, it bumped up to 0.1. His most recent PSA on 06/27/2023 was 0.2.   He has no other concerns or complaints today.   PMH: Past Medical History:  Diagnosis Date   Anxiety    Arthritis    "knees and hips" (02/28/2018)   Cancer (HCC)    prostate    Chicken pox    Depression    GERD (gastroesophageal reflux disease)    Heart murmur    Hernia, abdominal    High cholesterol    History of gout    Hypertension    Migraine    "probably monthly" (02/28/2018)   Motion sickness    ocean boats, circular motion   Neck pain    OSA on CPAP    Pneumonia    "once" (02/28/2018)   Refusal of blood transfusions as patient is Jehovah's Witness    Seasonal allergies    Type II diabetes mellitus (HCC)     Surgical History: Past Surgical History:  Procedure Laterality Date   CARDIAC CATHETERIZATION  02/28/2018   ESOPHAGOGASTRODUODENOSCOPY (EGD) WITH PROPOFOL N/A 03/17/2023   Procedure: ESOPHAGOGASTRODUODENOSCOPY (EGD) WITH PROPOFOL;  Surgeon: [name], MD;  Location: [facility];  Service: Endoscopy;  Laterality: N/A;  Diabetic   ESOPHAGOGASTRODUODENOSCOPY (EGD)  WITH PROPOFOL N/A 04/18/2023   Procedure: ESOPHAGOGASTRODUODENOSCOPY (EGD) WITH PROPOFOL;  Surgeon: [name], MD;  Location: [facility];  Service: Endoscopy;  Laterality: N/A;   LEFT HEART CATH AND CORONARY ANGIOGRAPHY N/A 02/28/2018   Procedure: LEFT HEART CATH AND CORONARY ANGIOGRAPHY;  Surgeon: Tonny Bollman, MD;  Location: [facility] INVASIVE CV LAB;  Service: Cardiovascular;  Laterality: N/A;   ROBOT ASSISTED LAPAROSCOPIC RADICAL PROSTATECTOMY N/A 11/19/2019   Procedure: XI ROBOTIC ASSISTED LAPAROSCOPIC RADICAL PROSTATECTOMY WITH LYMPH NODE DISSECTION;  Surgeon: Vanna Scotland, MD;  Location: [facility] ORS;  Service: Urology;  Laterality: N/A;   SHOULDER ARTHROSCOPY WITH ROTATOR CUFF REPAIR AND SUBACROMIAL DECOMPRESSION Right 05/21/2022   Procedure: Right shoulder arthroscopic rotator cuff repair (supraspinatus & subscapularis), subacromial decompression, and biceps tenodesis;  Surgeon: Signa Kell, MD;  Location: [facility] OR[facility];  Service: Orthopedics;  Laterality: Right;   WRIST GANGLION EXCISION Left 1984    Home Medications:  Allergies as of 06/29/2023       Reactions   Tape Rash   Paper tape-blisters        Medication List        Accurate as of June 29, 2023 10:43 AM. If you have any questions, ask your nurse or doctor.          albuterol 108 (90 Base) MCG/ACT inhaler Commonly known as: VENTOLIN HFA Inhale  2 puffs into the lungs every 6 (six) hours as needed for wheezing or shortness of breath.   Alcohol Prep Pads 1 each by Does not apply route 2 (two) times daily.   amLODipine 10 MG tablet Commonly known as: NORVASC Take 1 tablet (10 mg total) by mouth daily.   atorvastatin 20 MG tablet Commonly known as: LIPITOR Take 1 tablet (20 mg total) by mouth daily. What changed: when to take this   Blood Glucose Monitor System w/Device Kit Use as directed to check blood sugar once daily.   BLOOD GLUCOSE TEST STRIPS Strp Use to check blood sugar once a day.   buPROPion  300 MG 24 hr tablet Commonly known as: Wellbutrin XL Take 1 tablet (300 mg total) by mouth daily.   citalopram 40 MG tablet Commonly known as: CELEXA Take 1 tablet (40 mg total) by mouth daily.   ezetimibe 10 MG tablet Commonly known as: Zetia Take 1 tablet (10 mg total) by mouth daily.   glipiZIDE 10 MG 24 hr tablet Commonly known as: GLUCOTROL XL Take 1 tablet (10 mg total) by mouth daily with breakfast.   Lancet Device Misc Use to check blood sugar once a day.  DX: E11.9 May substitute to any manufacturer covered by patient's insurance.   Lancets Misc 1 each by Does not apply route in the morning and at bedtime.   freestyle lancets Use as directed to check blood sugar once daily.   pantoprazole 40 MG tablet Commonly known as: PROTONIX Take 1 tablet (40 mg total) by mouth 2 (two) times daily before a meal.   pantoprazole 40 MG tablet Commonly known as: Protonix Take 1 tablet (40 mg total) by mouth daily.   tadalafil 20 MG tablet Commonly known as: CIALIS Take 1 tablet (20 mg total) by mouth daily as needed for erectile dysfunction.   traZODone 100 MG tablet Commonly known as: DESYREL Take 2 tablets (200 mg total) by mouth at bedtime.        Allergies:  Allergies  Allergen Reactions   Tape Rash    Paper tape-blisters    Family History: Family History  Problem Relation Age of Onset   Diabetes Mother    Hyperlipidemia Mother    Bladder Cancer Father    Liver cancer Sister    Diabetes Sister    Diabetes Maternal Aunt    Arthritis Maternal Grandmother    Diabetes Maternal Grandmother    Arthritis Maternal Grandfather    Arthritis Paternal Grandmother    Diabetes Brother    Heart disease Brother    Colon cancer Neg Hx     Social History:  reports that he quit smoking about 13 years ago. His smoking use included cigarettes. He started smoking about 40 years ago. He has a 27 pack-year smoking history. He quit smokeless tobacco use about 28 years ago.   His smokeless tobacco use included chew. He reports current alcohol use of about 21.0 standard drinks of alcohol per week. He reports that he does not use drugs.   Physical Exam: BP (!) 153/93   Pulse 91   Ht 5\' 11"  (1.803 m)   Wt 222 lb 6.4 oz (100.9 kg)   BMI 31.02 kg/m   Constitutional:  Alert and oriented, No acute distress. HEENT: [place] AT, moist mucus membranes.  Trachea midline, no masses. Neurologic: Grossly intact, no focal deficits, moving all 4 extremities. Psychiatric: Normal mood and affect.   Assessment & Plan:    1. Prostate cancer/biochemical recurrence -  He now meets the criteria for biochemical recurrence. PSA has increased from 0.1 to 0.2 - Recommend referral to Dr. Rushie Chestnut in radiation oncology for consideration of salvage radiation - Would also recommend initiation of 6 months of androgen deprivation therapy, could be in the form of Orgovyx versus a 1 time depo of Eligard. - He prefers one time depo of Eligard - Prior authorization of Eligard to be obtained - Discussed side effects of ADT, including lower energy, decreased libido, potential weight gain, muscle mass loss, and hot flashes. - Advised to maintain exercise, take calcium and vitamin D supplements, and eat healthily to counteract side effects - Information was provided regarding Eligard injection - Discussed the continued ability to maintain his work schedule during radiation and suggested that he discuss scheduling with Dr. Rushie Chestnut  Return for nurse visit for Eligard injection, 6 months for repeat PSA.  I have reviewed the above documentation for accuracy and completeness, and I agree with the above.   Vanna Scotland, MD    [facility] [address], [address] [place], [place] [address] [phone]  I spent 36 total minutes on the day of the encounter including pre-visit review of the medical record, face-to-face time with the patient, and post visit ordering of  labs/imaging/tests.

## 2023-06-30 ENCOUNTER — Other Ambulatory Visit (HOSPITAL_COMMUNITY): Payer: Self-pay

## 2023-06-30 LAB — LIPID PANEL
Cholesterol: 192 mg/dL (ref <200)
HDL: 43 mg/dL (ref >=40)
Non-HDL Cholesterol (Calc): 149 mg/dL — ABNORMAL HIGH (ref <130)
Total CHOL/HDL Ratio: 4.5 (calc) (ref <5.0)
Triglycerides: 447 mg/dL — ABNORMAL HIGH (ref <150)

## 2023-06-30 LAB — MICROALBUMIN / CREATININE URINE RATIO
Creatinine, Urine: 91 mg/dL (ref 20–320)
Microalb, Ur: <0.2 mg/dL

## 2023-06-30 LAB — COMPLETE METABOLIC PANEL WITH GFR
AG Ratio: 1.8 (calc) (ref 1.0–2.5)
ALT: 28 U/L (ref 9–46)
AST: 22 U/L (ref 10–35)
Albumin: 4.6 g/dL (ref 3.6–5.1)
Alkaline phosphatase (APISO): 93 U/L (ref 35–144)
BUN: 14 mg/dL (ref 7–25)
CO2: 25 mmol/L (ref 20–32)
Calcium: 9.7 mg/dL (ref 8.6–10.3)
Chloride: 98 mmol/L (ref 98–110)
Creat: 0.86 mg/dL (ref 0.70–1.35)
Globulin: 2.6 g/dL (ref 1.9–3.7)
Glucose, Bld: 406 mg/dL — ABNORMAL HIGH (ref 65–139)
Potassium: 4.6 mmol/L (ref 3.5–5.3)
Sodium: 135 mmol/L (ref 135–146)
Total Bilirubin: 0.5 mg/dL (ref 0.2–1.2)
Total Protein: 7.2 g/dL (ref 6.1–8.1)
eGFR: 99 mL/min/{1.73_m2} (ref >=60)

## 2023-06-30 LAB — CBC
HCT: 46.5 % (ref 38.5–50.0)
Hemoglobin: 14.8 g/dL (ref 13.2–17.1)
MCH: 26.2 pg — ABNORMAL LOW (ref 27.0–33.0)
MCHC: 31.8 g/dL — ABNORMAL LOW (ref 32.0–36.0)
MCV: 82.3 fL (ref 80.0–100.0)
MPV: 10.5 fL (ref 7.5–12.5)
Platelets: 236 10*3/uL (ref 140–400)
RBC: 5.65 10*6/uL (ref 4.20–5.80)
RDW: 14.3 % (ref 11.0–15.0)
WBC: 4.3 10*3/uL (ref 3.8–10.8)

## 2023-06-30 LAB — HM DIABETES EYE EXAM

## 2023-07-06 DIAGNOSIS — F331 Major depressive disorder, recurrent, moderate: Secondary | ICD-10-CM | POA: Diagnosis not present

## 2023-07-07 ENCOUNTER — Encounter: Payer: Self-pay | Admitting: Internal Medicine

## 2023-07-07 ENCOUNTER — Other Ambulatory Visit (HOSPITAL_COMMUNITY): Payer: Self-pay

## 2023-07-07 ENCOUNTER — Ambulatory Visit
Admission: RE | Admit: 2023-07-07 | Discharge: 2023-07-07 | Disposition: A | Discharge status: Home / Self Care | Payer: 59 | Source: Ambulatory Visit | Attending: Radiation Oncology | Admitting: Radiation Oncology

## 2023-07-07 ENCOUNTER — Other Ambulatory Visit: Payer: Self-pay | Admitting: *Deleted

## 2023-07-07 ENCOUNTER — Encounter: Payer: Self-pay | Admitting: Radiation Oncology

## 2023-07-07 VITALS — BP 136/92 | HR 75 | Temp 98.3°F | Resp 12 | Wt 217.0 lb

## 2023-07-07 DIAGNOSIS — I10 Essential (primary) hypertension: Secondary | ICD-10-CM | POA: Diagnosis not present

## 2023-07-07 DIAGNOSIS — C61 Malignant neoplasm of prostate: Secondary | ICD-10-CM | POA: Diagnosis not present

## 2023-07-07 DIAGNOSIS — K219 Gastro-esophageal reflux disease without esophagitis: Secondary | ICD-10-CM | POA: Diagnosis not present

## 2023-07-07 DIAGNOSIS — Z800 Family history of malignant neoplasm of digestive organs: Secondary | ICD-10-CM | POA: Insufficient documentation

## 2023-07-07 DIAGNOSIS — E7800 Pure hypercholesterolemia, unspecified: Secondary | ICD-10-CM | POA: Insufficient documentation

## 2023-07-07 DIAGNOSIS — Z7984 Long term (current) use of oral hypoglycemic drugs: Secondary | ICD-10-CM | POA: Insufficient documentation

## 2023-07-07 DIAGNOSIS — Z8052 Family history of malignant neoplasm of bladder: Secondary | ICD-10-CM | POA: Diagnosis not present

## 2023-07-07 DIAGNOSIS — Z87891 Personal history of nicotine dependence: Secondary | ICD-10-CM | POA: Diagnosis not present

## 2023-07-07 DIAGNOSIS — E119 Type 2 diabetes mellitus without complications: Secondary | ICD-10-CM | POA: Insufficient documentation

## 2023-07-07 DIAGNOSIS — Z191 Hormone sensitive malignancy status: Secondary | ICD-10-CM | POA: Diagnosis not present

## 2023-07-07 DIAGNOSIS — Z79899 Other long term (current) drug therapy: Secondary | ICD-10-CM | POA: Insufficient documentation

## 2023-07-07 NOTE — Consult Note (Signed)
NEW PATIENT EVALUATION  Name: Marc Aguilar  MRN: [id number]  Date:   07/07/2023     DOB: 1961/05/22   This 62 y.o. 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   PREVIOUS INVESTIGATIONS:  PSMA PET scan ordered Clinical notes reviewed Pathology reports reviewed  HPI: Patient is a 62 year old male presents with an elevated PSA up to 6.7 back in 2021.  Biopsy revealed adenocarcinoma the prostate and patient underwent robotic assisted prostatectomy.  Surgical pathology at that time showed Gleason 7 (4+3).  There was extraprostatic extension and perineural invasion present.  4 lymph nodes were examined negative for metastatic disease.  Positive margins were in the left posterior mid gland to base.  He has done well postoperatively.  Recently his PSA jumped from 0.1-0.2.  He specifically denies urinary incontinence any specific increased lower urinary tract symptoms bone pain or any bowel complaints.  He is now referred to radiation oncology for consideration of salvage treatment. PLANNED TREATMENT REGIMEN: Salvage radiation therapy  PAST MEDICAL HISTORY:  has a past medical history of Anxiety, Arthritis, Cancer (HCC), Chicken pox, Depression, GERD (gastroesophageal reflux disease), Heart murmur, Hernia, abdominal, High cholesterol, History of gout, Hypertension, Migraine, Motion sickness, Neck pain, OSA on CPAP, Pneumonia, Refusal of blood transfusions as patient is Jehovah's Witness, Seasonal allergies, and Type II diabetes mellitus (HCC).    PAST SURGICAL HISTORY:  Past Surgical History:  Procedure Laterality Date   CARDIAC CATHETERIZATION  02/28/2018    ESOPHAGOGASTRODUODENOSCOPY (EGD) WITH PROPOFOL N/A 03/17/2023   Procedure: ESOPHAGOGASTRODUODENOSCOPY (EGD) WITH PROPOFOL;  Surgeon: [name], MD;  Location: [facility];  Service: Endoscopy;  Laterality: N/A;  Diabetic   ESOPHAGOGASTRODUODENOSCOPY (EGD) WITH PROPOFOL N/A 04/18/2023   Procedure: ESOPHAGOGASTRODUODENOSCOPY (EGD) WITH PROPOFOL;  Surgeon: [name], MD;  Location: [facility];  Service: Endoscopy;  Laterality: N/A;   LEFT HEART CATH AND CORONARY ANGIOGRAPHY N/A 02/28/2018   Procedure: LEFT HEART CATH AND CORONARY ANGIOGRAPHY;  Surgeon: Tonny Bollman, MD;  Location: [facility] INVASIVE CV LAB;  Service: Cardiovascular;  Laterality: N/A;   ROBOT ASSISTED LAPAROSCOPIC RADICAL PROSTATECTOMY N/A 11/19/2019   Procedure: XI ROBOTIC ASSISTED LAPAROSCOPIC RADICAL PROSTATECTOMY WITH LYMPH NODE DISSECTION;  Surgeon: Vanna Scotland, MD;  Location: [facility] OR[facility];  Service: Urology;  Laterality: N/A;   SHOULDER ARTHROSCOPY WITH ROTATOR CUFF REPAIR AND SUBACROMIAL DECOMPRESSION Right 05/21/2022   Procedure: Right shoulder arthroscopic rotator cuff repair (supraspinatus & subscapularis), subacromial decompression, and biceps tenodesis;  Surgeon: Signa Kell, MD;  Location: [facility] ORS;  Service: Orthopedics;  Laterality: Right;   WRIST GANGLION EXCISION Left 1984    FAMILY HISTORY: family history includes Arthritis in his maternal grandfather, maternal grandmother, and paternal grandmother; Bladder Cancer in his father; Diabetes in his brother, maternal aunt, maternal grandmother, mother, and sister; Heart disease in his brother; Hyperlipidemia in his mother; Liver cancer in his sister.  SOCIAL HISTORY:  reports that he quit smoking about 13 years ago. His smoking use included cigarettes. He started smoking about 40 years ago. He has a 27 pack-year smoking history. He quit smokeless tobacco use about 28 years ago.  His smokeless tobacco use included chew. He reports current alcohol use of about 21.0  standard drinks of  alcohol per week. He reports that he does not use drugs.  ALLERGIES: Tape  MEDICATIONS:  Current Outpatient Medications  Medication Sig Dispense Refill   albuterol (VENTOLIN HFA) 108 (90 Base) MCG/ACT inhaler Inhale 2 puffs into the lungs every 6 (six) hours as needed for wheezing or shortness of breath. 1 Inhaler 0   Alcohol Swabs (ALCOHOL PREP) PADS 1 each by Does not apply route 2 (two) times daily. 200 each 3   amLODipine (NORVASC) 10 MG tablet Take 1 tablet (10 mg total) by mouth daily. 90 tablet 0   atorvastatin (LIPITOR) 20 MG tablet Take 1 tablet (20 mg total) by mouth daily. (Patient taking differently: Take 20 mg by mouth at bedtime.) 90 tablet 0   Blood Glucose Monitoring Suppl (BLOOD GLUCOSE MONITOR SYSTEM) w/Device KIT Use as directed to check blood sugar once daily. 1 kit 0   buPROPion (WELLBUTRIN XL) 300 MG 24 hr tablet Take 1 tablet (300 mg total) by mouth daily. 90 tablet 0   citalopram (CELEXA) 40 MG tablet Take 1 tablet (40 mg total) by mouth daily. 90 tablet 0   ezetimibe (ZETIA) 10 MG tablet Take 1 tablet (10 mg total) by mouth daily. 90 tablet 0   glipiZIDE (GLUCOTROL XL) 10 MG 24 hr tablet Take 1 tablet (10 mg total) by mouth daily with breakfast. 90 tablet 0   Glucose Blood (BLOOD GLUCOSE TEST STRIPS) STRP Use to check blood sugar once a day. 100 strip 1   Lancet Device MISC Use to check blood sugar once a day.  DX: E11.9 May substitute to any manufacturer covered by patient's insurance. 1 each 0   Lancets (FREESTYLE) lancets Use as directed to check blood sugar once daily. 100 each 1   Lancets MISC 1 each by Does not apply route in the morning and at bedtime. 200 each 2   pantoprazole (PROTONIX) 40 MG tablet Take 1 tablet (40 mg total) by mouth 2 (two) times daily before a meal. 60 tablet 5   tadalafil (CIALIS) 20 MG tablet Take 1 tablet (20 mg total) by mouth daily as needed for erectile dysfunction. 10 tablet 6   traZODone (DESYREL) 100 MG tablet  Take 2 tablets (200 mg total) by mouth at bedtime. 60 tablet 0   No current facility-administered medications for this encounter.    ECOG PERFORMANCE STATUS:  0 - Asymptomatic  REVIEW OF SYSTEMS: Patient denies any weight loss, fatigue, weakness, fever, chills or night sweats. Patient denies any loss of vision, blurred vision. Patient denies any ringing  of the ears or hearing loss. No irregular heartbeat. Patient denies heart murmur or history of fainting. Patient denies any chest pain or pain radiating to her upper extremities. Patient denies any shortness of breath, difficulty breathing at night, cough or hemoptysis. Patient denies any swelling in the lower legs. Patient denies any nausea vomiting, vomiting of blood, or coffee ground material in the vomitus. Patient denies any stomach pain. Patient states has had normal bowel movements no significant constipation or diarrhea. Patient denies any dysuria, hematuria or significant nocturia. Patient denies any problems walking, swelling in the joints or loss of balance. Patient denies any skin changes, loss of hair or loss of weight. Patient denies any excessive worrying or anxiety or significant depression. Patient denies any problems with insomnia. Patient denies excessive thirst, polyuria, polydipsia. Patient denies any swollen glands, patient denies easy bruising or easy bleeding. Patient denies any recent infections, allergies or URI. Patient "s visual fields have not changed  significantly in recent time.   PHYSICAL EXAM: BP (!) 136/93   Pulse 75   Temp 98.3 F (36.8 C) (Tympanic)   Resp 12   Wt 217 lb (98.4 kg)   BMI 30.27 kg/m  Well-developed well-nourished patient in NAD. HEENT reveals PERLA, EOMI, discs not visualized.  Oral cavity is clear. No oral mucosal lesions are identified. Neck is clear without evidence of cervical or supraclavicular adenopathy. Lungs are clear to A&P. Cardiac examination is essentially unremarkable with regular  rate and rhythm without murmur rub or thrill. Abdomen is benign with no organomegaly or masses noted. Motor sensory and DTR levels are equal and symmetric in the upper and lower extremities. Cranial nerves II through XII are grossly intact. Proprioception is intact. No peripheral adenopathy or edema is identified. No motor or sensory levels are noted. Crude visual fields are within normal range.  LABORATORY DATA: Pathology reports reviewed    RADIOLOGY RESULTS: Mammogram and ultrasound reviewed: PSMA PET scan ordered   IMPRESSION: Pathologic stage IIIb Gleason 7 adenocarcinoma the prostate status post prostatectomy in 21 now with biochemical recurrence in 62 year old male  PLAN: Fo completeness sake I have ordered a PSMA PET scan.  I have run the [facility] on his initial pathology showing an 18% chance of lymph node involvement with an 80% chance of extracapsular extension.  Should PSA be normal we will treat his prostate and local regional lymph nodes.  Would treat his prostatic fossa the 70 Gray treating his pelvic lymph nodes to 50 Gray using IMRT dose painting technique.  Risks and benefits of treatment including increased lower Neri tract symptoms possible diarrhea fatigue alteration of blood counts skin reaction all were reviewed in detail with the patient.  He comprehends our treatment plan well.  Simulation will be scheduled after PSMA PET scan is complete.  He is also been scheduled to receive 51-month depot of Eligard through urology in the next couple of weeks.  I would like to take this opportunity to thank you for allowing me to participate in the care of your patient.[name], MD

## 2023-07-07 NOTE — Addendum Note (Signed)
Encounter addended by: Vanita Panda, RN on: 07/07/2023 2:00 PM  Actions taken: Vitals modified

## 2023-07-08 ENCOUNTER — Other Ambulatory Visit: Payer: Self-pay | Admitting: Internal Medicine

## 2023-07-08 ENCOUNTER — Other Ambulatory Visit: Payer: Self-pay

## 2023-07-08 ENCOUNTER — Other Ambulatory Visit (HOSPITAL_COMMUNITY): Payer: Self-pay

## 2023-07-09 ENCOUNTER — Other Ambulatory Visit (HOSPITAL_COMMUNITY): Payer: Self-pay

## 2023-07-11 ENCOUNTER — Other Ambulatory Visit (HOSPITAL_COMMUNITY): Payer: Self-pay

## 2023-07-11 NOTE — Telephone Encounter (Signed)
Requested medications are due for refill today.  unsure  Requested medications are on the active medications list.  Not this strength  Last refill. 05/13/2021 #90 0 rf - 20 mg  Future visit scheduled.   no  Notes to clinic. Med d/c'd 01/21/2021. Pt is requesting med. Please review for refill.    Requested Prescriptions  Pending Prescriptions Disp Refills   atorvastatin (LIPITOR) 10 MG tablet 90 tablet 0    Sig: TAKE 1 TABLET (10 MG TOTAL) BY MOUTH AT BEDTIME.     There is no refill protocol information for this order

## 2023-07-18 ENCOUNTER — Telehealth: Payer: Self-pay

## 2023-07-18 ENCOUNTER — Ambulatory Visit
Admission: RE | Admit: 2023-07-18 | Discharge: 2023-07-18 | Disposition: A | Discharge status: Home / Self Care | Payer: 59 | Source: Ambulatory Visit | Attending: Radiation Oncology | Admitting: Radiation Oncology

## 2023-07-18 DIAGNOSIS — C61 Malignant neoplasm of prostate: Secondary | ICD-10-CM | POA: Diagnosis not present

## 2023-07-18 MED ORDER — FLOTUFOLASTAT F 18 GALLIUM 296-5846 MBQ/ML IV SOLN
8.0000 | Freq: Once | INTRAVENOUS | Status: AC
  Administered 2023-07-18: 8.8 via INTRAVENOUS
  Filled 2023-07-18: qty 8

## 2023-07-18 NOTE — Telephone Encounter (Signed)
Eligard PA submitted via [employer]. Awaiting response.

## 2023-07-19 ENCOUNTER — Telehealth: Payer: Self-pay

## 2023-07-19 NOTE — Telephone Encounter (Addendum)
This patient just needs a one time Eligard injection. Edson Snowball is working on [employer] for this medication.  I left a message for the patient to call back to scheduled one time injection on one of the PAs schedules

## 2023-07-20 DIAGNOSIS — F331 Major depressive disorder, recurrent, moderate: Secondary | ICD-10-CM | POA: Diagnosis not present

## 2023-07-26 ENCOUNTER — Other Ambulatory Visit: Payer: Self-pay

## 2023-07-26 ENCOUNTER — Ambulatory Visit
Admission: EM | Admit: 2023-07-26 | Discharge: 2023-07-26 | Disposition: A | Discharge status: Home / Self Care | Payer: 59 | Attending: Emergency Medicine | Admitting: Emergency Medicine

## 2023-07-26 DIAGNOSIS — R1011 Right upper quadrant pain: Secondary | ICD-10-CM | POA: Diagnosis not present

## 2023-07-26 DIAGNOSIS — R1013 Epigastric pain: Secondary | ICD-10-CM | POA: Diagnosis not present

## 2023-07-26 MED ORDER — ACETAMINOPHEN 325 MG PO TABS
650.0000 mg | ORAL_TABLET | Freq: Once | ORAL | Status: AC
  Administered 2023-07-26: 650 mg via ORAL

## 2023-07-26 MED ORDER — ONDANSETRON 4 MG PO TBDP
4.0000 mg | ORAL_TABLET | Freq: Three times a day (TID) | ORAL | 0 refills | Status: DC | PRN
  Filled 2023-07-26: qty 20, 7d supply, fill #0

## 2023-07-26 NOTE — ED Provider Notes (Signed)
Marc Aguilar    CSN: [id number] Arrival date & time: 07/26/23  1633      History   Chief Complaint Chief Complaint  Patient presents with   Abdominal Pain    HPI Marc Aguilar is a 62 y.o. male.   Patient presents for evaluation of bilateral lower/side abdominal pain beginning 1 day ago.  Pain has been constant rating 8 out of 10.  Associated nausea but denies vomiting.  Accompanying fever, peaking at 100.2.  Poor appetite, has not consumed food or liquid since symptoms began.  Has not attempted treatment.  Standing does help to alleviate severity of pain.  Has all abdominal organs.  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  Denies URI symptoms, abdominal bloating, increased gas production, acid reflux.  Past Medical History:  Diagnosis Date   Anxiety    Arthritis    "knees and hips" (02/28/2018)   Cancer (HCC)    prostate    Chicken pox    Depression    GERD (gastroesophageal reflux disease)    Heart murmur    Hernia, abdominal    High cholesterol    History of gout    Hypertension    Migraine    "probably monthly" (02/28/2018)   Motion sickness    ocean boats, circular motion   Neck pain    OSA on CPAP    Pneumonia    "once" (02/28/2018)   Refusal of blood transfusions as patient is Jehovah's Witness    Seasonal allergies    Type II diabetes mellitus (HCC)     Patient Active Problem List   Diagnosis Date Noted   Cervical radiculitis 03/18/2022   Insomnia 08/17/2021   Class 1 obesity due to excess calories with body mass index (BMI) of 31.0 to 31.9 in adult 08/17/2021   History of prostate cancer 11/19/2019   Osteoarthritis 11/14/2018   Migraines 11/14/2018   Anxiety and depression 11/14/2018   HTN (hypertension) 11/14/2018   Hyperlipidemia associated with type 2 diabetes mellitus (HCC) 10/02/2015   OSA (obstructive sleep apnea) 03/13/2015   GERD (gastroesophageal reflux disease) 03/13/2015   Diabetes mellitus (HCC)  10/30/2013    Past Surgical History:  Procedure Laterality Date   CARDIAC CATHETERIZATION  02/28/2018   ESOPHAGOGASTRODUODENOSCOPY (EGD) WITH PROPOFOL N/A 03/17/2023   Procedure: ESOPHAGOGASTRODUODENOSCOPY (EGD) WITH PROPOFOL;  Surgeon: [name], MD;  Location: [facility];  Service: Endoscopy;  Laterality: N/A;  Diabetic   ESOPHAGOGASTRODUODENOSCOPY (EGD) WITH PROPOFOL N/A 04/18/2023   Procedure: ESOPHAGOGASTRODUODENOSCOPY (EGD) WITH PROPOFOL;  Surgeon: [name], MD;  Location: [facility];  Service: Endoscopy;  Laterality: N/A;   LEFT HEART CATH AND CORONARY ANGIOGRAPHY N/A 02/28/2018   Procedure: LEFT HEART CATH AND CORONARY ANGIOGRAPHY;  Surgeon: Tonny Bollman, MD;  Location: [facility];  Service: Cardiovascular;  Laterality: N/A;   ROBOT ASSISTED LAPAROSCOPIC RADICAL PROSTATECTOMY N/A 11/19/2019   Procedure: XI ROBOTIC ASSISTED LAPAROSCOPIC RADICAL PROSTATECTOMY WITH LYMPH NODE DISSECTION;  Surgeon: Vanna Scotland, MD;  Location: [facility];  Service: Urology;  Laterality: N/A;   SHOULDER ARTHROSCOPY WITH ROTATOR CUFF REPAIR AND SUBACROMIAL DECOMPRESSION Right 05/21/2022   Procedure: Right shoulder arthroscopic rotator cuff repair (supraspinatus & subscapularis), subacromial decompression, and biceps tenodesis;  Surgeon: Signa Kell, MD;  Location: [facility];  Service: Orthopedics;  Laterality: Right;   WRIST GANGLION EXCISION Left 1984       Home Medications    Prior to Admission medications   Medication Sig Start Date End Date  Taking? Authorizing Provider  ondansetron (ZOFRAN-ODT) 4 MG disintegrating tablet Take 1 tablet (4 mg total) by mouth every 8 (eight) hours as needed for nausea or vomiting. 07/26/23  Yes Yasiel Goyne R, NP  albuterol (VENTOLIN HFA) 108 (90 Base) MCG/ACT inhaler Inhale 2 puffs into the lungs every 6 (six) hours as needed for wheezing or shortness of breath. 03/26/19   Lorre Munroe, NP  Alcohol Swabs (ALCOHOL PREP) PADS 1 each by  Does not apply route 2 (two) times daily. 02/01/17   Lorre Munroe, NP  amLODipine (NORVASC) 10 MG tablet Take 1 tablet (10 mg total) by mouth daily. 06/21/23   Lorre Munroe, NP  atorvastatin (LIPITOR) 20 MG tablet Take 1 tablet (20 mg total) by mouth daily. Patient taking differently: Take 20 mg by mouth at bedtime. 05/13/21   Worthy Rancher B, FNP  Blood Glucose Monitoring Suppl (BLOOD GLUCOSE MONITOR SYSTEM) w/Device KIT Use as directed to check blood sugar once daily. 06/29/23   Lorre Munroe, NP  buPROPion (WELLBUTRIN XL) 300 MG 24 hr tablet Take 1 tablet (300 mg total) by mouth daily. 06/21/23   Lorre Munroe, NP  citalopram (CELEXA) 40 MG tablet Take 1 tablet (40 mg total) by mouth daily. 06/21/23   Lorre Munroe, NP  ezetimibe (ZETIA) 10 MG tablet Take 1 tablet (10 mg total) by mouth daily. 06/21/23   Lorre Munroe, NP  glipiZIDE (GLUCOTROL XL) 10 MG 24 hr tablet Take 1 tablet (10 mg total) by mouth daily with breakfast. 06/28/23   Lorre Munroe, NP  Glucose Blood (BLOOD GLUCOSE TEST STRIPS) STRP Use to check blood sugar once a day. 06/29/23   Lorre Munroe, NP  Lancet Device MISC Use to check blood sugar once a day.  DX: E11.9 May substitute to any manufacturer covered by patient's insurance. 06/29/23   Lorre Munroe, NP  Lancets (FREESTYLE) lancets Use as directed to check blood sugar once daily. 06/29/23   Lorre Munroe, NP  Lancets MISC 1 each by Does not apply route in the morning and at bedtime. 02/06/20   Lorre Munroe, NP  pantoprazole (PROTONIX) 40 MG tablet Take 1 tablet (40 mg total) by mouth 2 (two) times daily before a meal. 02/17/23   [name], MD  tadalafil (CIALIS) 20 MG tablet Take 1 tablet (20 mg total) by mouth daily as needed for erectile dysfunction. 07/27/22   Vanna Scotland, MD  traZODone (DESYREL) 100 MG tablet Take 2 tablets (200 mg total) by mouth at bedtime. 06/21/23   Lorre Munroe, NP    Family History Family History  Problem Relation Age of Onset    Diabetes Mother    Hyperlipidemia Mother    Bladder Cancer Father    Liver cancer Sister    Diabetes Sister    Diabetes Maternal Aunt    Arthritis Maternal Grandmother    Diabetes Maternal Grandmother    Arthritis Maternal Grandfather    Arthritis Paternal Grandmother    Diabetes Brother    Heart disease Brother    Colon cancer Neg Hx     Social History Social History   Tobacco Use   Smoking status: Former    Current packs/day: 0.00    Average packs/day: 1 pack/day for 27.0 years (27.0 ttl pk-yrs)    Types: Cigarettes    Start date: 10/18/1982    Quit date: 10/18/2009    Years since quitting: 13.7   Smokeless tobacco: Former  Types: Dorna Bloom    Quit date: 10/18/1994  Vaping Use   Vaping status: Never Used  Substance Use Topics   Alcohol use: Yes    Alcohol/week: 21.0 standard drinks of alcohol    Types: 21 Cans of beer per week   Drug use: Never     Allergies   Tape   Review of Systems Review of Systems   Physical Exam Triage Vital Signs ED Triage Vitals  Encounter Vitals Group     BP 07/26/23 1652 129/84     Systolic BP Percentile --      Diastolic BP Percentile --      Pulse Rate 07/26/23 1649 (!) 114     Resp 07/26/23 1649 16     Temp 07/26/23 1649 (!) 101 F (38.3 C)     Temp src --      SpO2 07/26/23 1649 98 %     Weight --      Height --      Head Circumference --      Peak Flow --      Pain Score 07/26/23 1641 8     Pain Loc --      Pain Education --      Exclude from Growth Chart --    No data found.  Updated Vital Signs BP 129/84   Pulse (!) 114   Temp (!) 101 F (38.3 C)   Resp 16   SpO2 98%   Visual Acuity Right Eye Distance:   Left Eye Distance:   Bilateral Distance:    Right Eye Near:   Left Eye Near:    Bilateral Near:     Physical Exam Constitutional:      Appearance: Normal appearance.  Eyes:     Extraocular Movements: Extraocular movements intact.  Pulmonary:     Effort: Pulmonary effort is normal.  Abdominal:      General: Abdomen is flat. Bowel sounds are increased.     Palpations: Abdomen is soft.     Tenderness: There is abdominal tenderness in the right upper quadrant and epigastric area.  Neurological:     Mental Status: He is alert and oriented to person, place, and time. Mental status is at baseline.      UC Treatments / Results  Labs (all labs ordered are listed, but only abnormal results are displayed) Labs Reviewed - No data to display  EKG   Radiology No results found.  Procedures Procedures (including critical care time)  Medications Ordered in UC Medications  acetaminophen (TYLENOL) tablet 650 mg (650 mg Oral Given 07/26/23 1652)    Initial Impression / Assessment and Plan / UC Course  I have reviewed the triage vital signs and the nursing notes.  Pertinent labs & imaging results that were available during my care of the patient were reviewed by me and considered in my medical decision making (see chart for details).  Epigastric abdominal pain, right upper quadrant abdominal pain  Fever of 101 with associated tachycardia noted in triage, abdominal tenderness noted to the right upper quadrant and epigastric region, experiencing nausea differential diagnosis included viral gastroenteritis, colitis versus cholecystitis, discussed this with patient, at this time he is in no signs of distress nor toxic appearing advised to monitor, prescribe Zofran, discussed with patient that he must consume at least fluids to maintain his hydration, may attempt food as tolerated recommended a bland diet until symptoms resolve, given strict precautions at any point if abdominal pain worsens in severity he is  to go to the nearest emergency department for immediate evaluation, verbalized understanding Final Clinical Impressions(s) / UC Diagnoses   Final diagnoses:  Abdominal pain, epigastric  RUQ abdominal pain     Discharge Instructions      Today you are being evaluated for your  abdominal pain, on exam you have tenderness to the center and the right upper quadrant of your abdomen, organs of concern in this area includes her stomach, gallbladder and colon  Possible Causes of your symptoms  The stomach can cause the symptoms due to viral illness, there is currently a stomach bug that is beginning to circulate within the community, this typically will cause abdominal cramping/pain, nausea vomiting or diarrhea, this typically resolves on its own within a few days and treatment is to minimize symptoms and stay hydrated  The gallbladder and the colon can become inflamed and infected causing severe abdominal pain, fevers, nausea and vomiting etc. and these are more serious conditions therefore at any point if your abdominal pain worsens in severity you will need to go to the nearest emergency department for immediate evaluation and to receive, ultrasound or CT imaging to visualize and assess your organs  While you do not have the desire to eat you need to attempt to consume fluids to maintain your hydration to give your body energy or you will start to have a worsening headache, dizziness and lightheadedness and generally will feel worse, increase your fluid intake until you are able to tolerate food as normal, primarily do this through water or Gatorade or similar products you may eat as tolerated, attempt bland foods such as breads, rice toast fruits etc.  You may use Zofran every 8 hours as needed to help calm nausea, placed under tongue and allow to dissolve, then wait 30 minutes before attempting to eat or drink, this is being used in efforts to help increase your consumption and to maintain your hydration  If you begin to have any diarrhea you may use over-the-counter Imodium, taking every 6 hours as needed to help slow symptoms  For pain or fever you may use Tylenol taking every 6 hours as needed, recommend taking consistently to keep fevers to minimize as high fevers will  make you feel worse and exacerbate symptoms  , May also attempt use of over-the-counter medicine such as Pepto-Bismol and Maalox   ED Prescriptions     Medication Sig Dispense Auth. Provider   ondansetron (ZOFRAN-ODT) 4 MG disintegrating tablet Take 1 tablet (4 mg total) by mouth every 8 (eight) hours as needed for nausea or vomiting. 20 tablet Valinda Hoar, NP      PDMP not reviewed this encounter.   Valinda Hoar, NP 07/26/23 1721

## 2023-07-26 NOTE — ED Triage Notes (Signed)
Patient to Urgent Care with complaints of abdominal pain. Reports RUQ and LUQ pain. Denies any VD. Some nausea. Denies any urinary symptoms. Poor appetite.   Symptoms started last night. Temp last night 99.9, then 100.2.   Reports standing helps alleviate some symptoms.

## 2023-07-26 NOTE — Discharge Instructions (Signed)
Today you are being evaluated for your abdominal pain, on exam you have tenderness to the center and the right upper quadrant of your abdomen, organs of concern in this area includes her stomach, gallbladder and colon  Possible Causes of your symptoms  The stomach can cause the symptoms due to viral illness, there is currently a stomach bug that is beginning to circulate within the community, this typically will cause abdominal cramping/pain, nausea vomiting or diarrhea, this typically resolves on its own within a few days and treatment is to minimize symptoms and stay hydrated  The gallbladder and the colon can become inflamed and infected causing severe abdominal pain, fevers, nausea and vomiting etc. and these are more serious conditions therefore at any point if your abdominal pain worsens in severity you will need to go to the nearest emergency department for immediate evaluation and to receive, ultrasound or CT imaging to visualize and assess your organs  While you do not have the desire to eat you need to attempt to consume fluids to maintain your hydration to give your body energy or you will start to have a worsening headache, dizziness and lightheadedness and generally will feel worse, increase your fluid intake until you are able to tolerate food as normal, primarily do this through water or Gatorade or similar products you may eat as tolerated, attempt bland foods such as breads, rice toast fruits etc.  You may use Zofran every 8 hours as needed to help calm nausea, placed under tongue and allow to dissolve, then wait 30 minutes before attempting to eat or drink, this is being used in efforts to help increase your consumption and to maintain your hydration  If you begin to have any diarrhea you may use over-the-counter Imodium, taking every 6 hours as needed to help slow symptoms  For pain or fever you may use Tylenol taking every 6 hours as needed, recommend taking consistently to keep  fevers to minimize as high fevers will make you feel worse and exacerbate symptoms  , May also attempt use of over-the-counter medicine such as Pepto-Bismol and Maalox

## 2023-07-28 NOTE — Telephone Encounter (Signed)
Patient called back and is scheduled for 08/05/23

## 2023-07-28 NOTE — Telephone Encounter (Signed)
Left message for Mrs Laday to have patient call us back to schedule, patient's voicemail was not set up

## 2023-07-28 NOTE — Telephone Encounter (Signed)
Incoming Eligard approval from [facility]  Dates: 07/25/2023 - 01/21/2024   Auth #[id number]

## 2023-07-29 ENCOUNTER — Other Ambulatory Visit: Payer: Self-pay

## 2023-07-29 ENCOUNTER — Ambulatory Visit (INDEPENDENT_AMBULATORY_CARE_PROVIDER_SITE_OTHER): Payer: 59 | Admitting: Internal Medicine

## 2023-07-29 ENCOUNTER — Encounter: Payer: Self-pay | Admitting: Internal Medicine

## 2023-07-29 VITALS — BP 118/72 | HR 111 | Temp 97.6°F | Wt 215.0 lb

## 2023-07-29 DIAGNOSIS — R0602 Shortness of breath: Secondary | ICD-10-CM | POA: Diagnosis not present

## 2023-07-29 DIAGNOSIS — R509 Fever, unspecified: Secondary | ICD-10-CM | POA: Diagnosis not present

## 2023-07-29 DIAGNOSIS — R1084 Generalized abdominal pain: Secondary | ICD-10-CM | POA: Diagnosis not present

## 2023-07-29 DIAGNOSIS — R110 Nausea: Secondary | ICD-10-CM | POA: Diagnosis not present

## 2023-07-29 DIAGNOSIS — G44209 Tension-type headache, unspecified, not intractable: Secondary | ICD-10-CM | POA: Diagnosis not present

## 2023-07-29 DIAGNOSIS — M5412 Radiculopathy, cervical region: Secondary | ICD-10-CM

## 2023-07-29 LAB — POC COVID19 BINAXNOW: SARS Coronavirus 2 Ag: NEGATIVE

## 2023-07-29 MED ORDER — METHOCARBAMOL 500 MG PO TABS
500.0000 mg | ORAL_TABLET | Freq: Three times a day (TID) | ORAL | 0 refills | Status: DC | PRN
  Filled 2023-07-29: qty 30, 10d supply, fill #0

## 2023-07-29 MED ORDER — PREDNISONE 10 MG PO TABS
ORAL_TABLET | ORAL | 0 refills | Status: AC
Start: 2023-07-29 — End: 2023-08-07
  Filled 2023-07-29: qty 18, 9d supply, fill #0

## 2023-07-29 NOTE — Patient Instructions (Signed)
Neck Exercises Ask your health care provider which exercises are safe for you. Do exercises exactly as told by your health care provider and adjust them as directed. It is normal to feel mild stretching, pulling, tightness, or discomfort as you do these exercises. Stop right away if you feel sudden pain or your pain gets worse. Do not begin these exercises until told by your health care provider. Neck exercises can be important for many reasons. They can improve strength and maintain flexibility in your neck, which will help your upper back and prevent neck pain. Stretching exercises Rotation neck stretching  Sit in a chair or stand up. Place your feet flat on the floor, shoulder-width apart. Slowly turn your head (rotate) to the right until a slight stretch is felt. Turn it all the way to the right so you can look over your right shoulder. Do not tilt or tip your head. Hold this position for 10-30 seconds. Slowly turn your head (rotate) to the left until a slight stretch is felt. Turn it all the way to the left so you can look over your left shoulder. Do not tilt or tip your head. Hold this position for 10-30 seconds. Repeat __________ times. Complete this exercise __________ times a day. Neck retraction  Sit in a sturdy chair or stand up. Look straight ahead. Do not bend your neck. Use your fingers to push your chin backward (retraction). Do not bend your neck for this movement. Continue to face straight ahead. If you are doing the exercise properly, you will feel a slight sensation in your throat and a stretch at the back of your neck. Hold the stretch for 1-2 seconds. Repeat __________ times. Complete this exercise __________ times a day. Strengthening exercises Neck press  Lie on your back on a firm bed or on the floor with a pillow under your head. Use your neck muscles to push your head down on the pillow and straighten your spine. Hold the position as well as you can. Keep your head  facing up (in a neutral position) and your chin tucked. Slowly count to 5 while holding this position. Repeat __________ times. Complete this exercise __________ times a day. Isometrics These are exercises in which you strengthen the muscles in your neck while keeping your neck still (isometrics). Sit in a supportive chair and place your hand on your forehead. Keep your head and face facing straight ahead. Do not flex or extend your neck while doing isometrics. Push forward with your head and neck while pushing back with your hand. Hold for 10 seconds. Do the sequence again, this time putting your hand against the back of your head. Use your head and neck to push backward against the hand pressure. Finally, do the same exercise on either side of your head, pushing sideways against the pressure of your hand. Repeat __________ times. Complete this exercise __________ times a day. Prone head lifts  Lie face-down (prone position), resting on your elbows so that your chest and upper back are raised. Start with your head facing downward, near your chest. Position your chin either on or near your chest. Slowly lift your head upward. Lift until you are looking straight ahead. Then continue lifting your head as far back as you can comfortably stretch. Hold your head up for 5 seconds. Then slowly lower it to your starting position. Repeat __________ times. Complete this exercise __________ times a day. Supine head lifts  Lie on your back (supine position), bending your knees   to point to the ceiling and keeping your feet flat on the floor. Lift your head slowly off the floor, raising your chin toward your chest. Hold for 5 seconds. Repeat __________ times. Complete this exercise __________ times a day. Scapular retraction  Stand with your arms at your sides. Look straight ahead. Slowly pull both shoulders (scapulae) backward and downward (retraction) until you feel a stretch between your shoulder  blades in your upper back. Hold for 10-30 seconds. Relax and repeat. Repeat __________ times. Complete this exercise __________ times a day. Contact a health care provider if: Your neck pain or discomfort gets worse when you do an exercise. Your neck pain or discomfort does not improve within 2 hours after you exercise. If you have any of these problems, stop exercising right away. Do not do the exercises again unless your health care provider says that you can. Get help right away if: You develop sudden, severe neck pain. If this happens, stop exercising right away. Do not do the exercises again unless your health care provider says that you can. This information is not intended to replace advice given to you by your health care provider. Make sure you discuss any questions you have with your health care provider. Document Revised: 03/31/2021 Document Reviewed: 03/31/2021 [employer] Patient Education  2024 [employer]  

## 2023-07-29 NOTE — Progress Notes (Signed)
Subjective:    Patient ID: Marc Aguilar, male    DOB: Mar 15, 1961, 62 y.o.   MRN: [id number]  HPI  Discussed the use of AI scribe software for clinical note transcription with the patient, who gave verbal consent to proceed.   The patient, with a history of chronic neck pain and cervical radiculitis, presented for an urgent care follow-up. The initial visit to urgent care was prompted by bilateral abdominal pain, a headache, neck pain, fatigue, and a peculiar sensation in the chest. The headache was described as a feeling of tightness located around the sides, possibly linked to the neck pain. The neck pain was similar to their chronic pain but more severe. The chest discomfort was described as a feeling of incomplete lung expansion, accompanied by slight shortness of breath and tightness. However, there was no chest pain related to the heart.  He has been taking ibuprofen OTC for his neck pain and headaches with minimal relief of symptoms.  The abdominal pain was bilateral and has since subsided. The patient experienced mild nausea but denied vomiting, constipation, or diarrhea. They denied urinary symptoms.   The patient has been under the care of Dr. Myer Haff for their neck pain. They attempted physical therapy, which included traction, but this seemed to exacerbate the symptoms. The physical therapist concluded that therapy would not be beneficial for the patient. The patient's last MRI of the cervical spine was in May 2023.       Review of Systems     Past Medical History:  Diagnosis Date   Anxiety    Arthritis    "knees and hips" (02/28/2018)   Cancer (HCC)    prostate    Chicken pox    Depression    GERD (gastroesophageal reflux disease)    Heart murmur    Hernia, abdominal    High cholesterol    History of gout    Hypertension    Migraine    "probably monthly" (02/28/2018)   Motion sickness    ocean boats, circular motion   Neck pain    OSA on CPAP    Pneumonia     "once" (02/28/2018)   Refusal of blood transfusions as patient is Jehovah's Witness    Seasonal allergies    Type II diabetes mellitus (HCC)     Current Outpatient Medications  Medication Sig Dispense Refill   albuterol (VENTOLIN HFA) 108 (90 Base) MCG/ACT inhaler Inhale 2 puffs into the lungs every 6 (six) hours as needed for wheezing or shortness of breath. 1 Inhaler 0   Alcohol Swabs (ALCOHOL PREP) PADS 1 each by Does not apply route 2 (two) times daily. 200 each 3   amLODipine (NORVASC) 10 MG tablet Take 1 tablet (10 mg total) by mouth daily. 90 tablet 0   atorvastatin (LIPITOR) 20 MG tablet Take 1 tablet (20 mg total) by mouth daily. (Patient taking differently: Take 20 mg by mouth at bedtime.) 90 tablet 0   Blood Glucose Monitoring Suppl (BLOOD GLUCOSE MONITOR SYSTEM) w/Device KIT Use as directed to check blood sugar once daily. 1 kit 0   buPROPion (WELLBUTRIN XL) 300 MG 24 hr tablet Take 1 tablet (300 mg total) by mouth daily. 90 tablet 0   citalopram (CELEXA) 40 MG tablet Take 1 tablet (40 mg total) by mouth daily. 90 tablet 0   ezetimibe (ZETIA) 10 MG tablet Take 1 tablet (10 mg total) by mouth daily. 90 tablet 0   glipiZIDE (GLUCOTROL XL) 10 MG 24 hr  tablet Take 1 tablet (10 mg total) by mouth daily with breakfast. 90 tablet 0   Glucose Blood (BLOOD GLUCOSE TEST STRIPS) STRP Use to check blood sugar once a day. 100 strip 1   Lancet Device MISC Use to check blood sugar once a day.  DX: E11.9 May substitute to any manufacturer covered by patient's insurance. 1 each 0   Lancets (FREESTYLE) lancets Use as directed to check blood sugar once daily. 100 each 1   Lancets MISC 1 each by Does not apply route in the morning and at bedtime. 200 each 2   ondansetron (ZOFRAN-ODT) 4 MG disintegrating tablet Take 1 tablet (4 mg total) by mouth every 8 (eight) hours as needed for nausea or vomiting. 20 tablet 0   pantoprazole (PROTONIX) 40 MG tablet Take 1 tablet (40 mg total) by mouth 2 (two) times  daily before a meal. 60 tablet 5   tadalafil (CIALIS) 20 MG tablet Take 1 tablet (20 mg total) by mouth daily as needed for erectile dysfunction. 10 tablet 6   traZODone (DESYREL) 100 MG tablet Take 2 tablets (200 mg total) by mouth at bedtime. 60 tablet 0   No current facility-administered medications for this visit.    Allergies  Allergen Reactions   Tape Rash    Paper tape-blisters    Family History  Problem Relation Age of Onset   Diabetes Mother    Hyperlipidemia Mother    Bladder Cancer Father    Liver cancer Sister    Diabetes Sister    Diabetes Maternal Aunt    Arthritis Maternal Grandmother    Diabetes Maternal Grandmother    Arthritis Maternal Grandfather    Arthritis Paternal Grandmother    Diabetes Brother    Heart disease Brother    Colon cancer Neg Hx     Social History   Socioeconomic History   Marital status: Married    Spouse name: Not on file   Number of children: 2   Years of education: Not on file   Highest education level: Bachelor's degree (e.g., BA, AB, BS)  Occupational History   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  Tobacco Use   Smoking status: Former    Current packs/day: 0.00    Average packs/day: 1 pack/day for 27.0 years (27.0 ttl pk-yrs)    Types: Cigarettes    Start date: 10/18/1982    Quit date: 10/18/2009    Years since quitting: 13.7   Smokeless tobacco: Former    Types: Chew    Quit date: 10/18/1994  Vaping Use   Vaping status: Never Used  Substance and Sexual Activity   Alcohol use: Yes    Alcohol/week: 21.0 standard drinks of alcohol    Types: 21 Cans of beer per week   Drug use: Never   Sexual activity: Not on file  Other Topics Concern   Not on file  Social History Narrative   Not on file   Social Determinants of Health   Financial Resource Strain: Low Risk  (03/28/2023)   Overall Financial Resource Strain (CARDIA)    Difficulty of Paying Living Expenses: Not hard at all  Food Insecurity: No Food Insecurity (07/07/2023)   Hunger  Vital Sign    Worried About Running Out of Food in the Last Year: Never true    Ran Out of Food in the Last Year: Never true  Transportation Needs: No Transportation Needs (03/28/2023)   PRAPARE - Transportation    Lack of Transportation (Medical): No    Lack  of Transportation (Non-Medical): No  Physical Activity: Sufficiently Active (03/28/2023)   Exercise Vital Sign    Days of Exercise per Week: 1 day    Minutes of Exercise per Session: 150+ min  Stress: Stress Concern Present (03/28/2023)   [employer] of Occupational Health - Occupational Stress Questionnaire    Feeling of Stress : Very much  Social Connections: Moderately Isolated (03/28/2023)   Social Connection and Isolation Panel [NHANES]    Frequency of Communication with Friends and Family: Once a week    Frequency of Social Gatherings with Friends and Family: Never    Attends Religious Services: More than 4 times per year    Active Member of [employer] or Organizations: No    Attends Engineer, structural: Not on file    Marital Status: Married  Catering manager Violence: Not on file     Constitutional: Patient reports fever, headache.  Denies malaise, fatigue, or abrupt weight changes.  HEENT: Denies eye pain, eye redness, ear pain, ringing in the ears, wax buildup, runny nose, nasal congestion, bloody nose, or sore throat. Respiratory: Patient reports mild shortness of breath.  Denies difficulty breathing, cough or sputum production.   Cardiovascular: Patient reports chest tightness.  Denies chest pain, palpitations or swelling in the hands or feet.  Gastrointestinal: Patient reports abdominal pain, now resolved.  Denies bloating, constipation, diarrhea or blood in the stool.  GU: Denies urgency, frequency, pain with urination, burning sensation, blood in urine, odor or discharge. Musculoskeletal: Patient reports chronic neck pain.  Denies decrease in range of motion, difficulty with gait, muscle pain or joint  swelling.  Skin: Denies redness, rashes, lesions or ulcercations.  Neurological: Patient reports insomnia.  Denies dizziness, difficulty with memory, difficulty with speech or problems with balance and coordination.  Psych: Patient has a history of anxiety and depression.  Denies SI/HI.  No other specific complaints in a complete review of systems (except as listed in HPI above).  Objective:   Physical Exam  BP 118/72 (BP Location: Left Arm, Patient Position: Sitting, Cuff Size: Normal)   Pulse (!) 111   Temp 97.6 F (36.4 C) (Temporal)   Wt 215 lb (97.5 kg)   SpO2 97%   BMI 29.99 kg/m   Wt Readings from Last 3 Encounters:  07/07/23 217 lb (98.4 kg)  06/29/23 222 lb 6.4 oz (100.9 kg)  06/28/23 224 lb (101.6 kg)    General: Appears his stated age, obese, in NAD. Skin: Warm, dry and intact.  HEENT: Head: normal shape and size; Eyes: sclera white, no icterus, conjunctiva pink, PERRLA and EOMs intact;  Cardiovascular: Tachycardic with normal rhythm. S1,S2 noted.  No murmur, rubs or gallops noted.  Pulmonary/Chest: Normal effort and positive vesicular breath sounds. No respiratory distress. No wheezes, rales or ronchi noted.  Abdomen: Soft and nontender. Normal bowel sounds.  Musculoskeletal: Normal flexion, extension, rotation of the cervical spine.  No bony tenderness noted over the cervical spine or the paraspinal muscles.  Shoulder shrug equal.  No difficulty with gait.  Neurological: Alert and oriented. Coordination normal.    BMET    Component Value Date/Time   NA 135 06/28/2023 1525   K 4.6 06/28/2023 1525   CL 98 06/28/2023 1525   CO2 25 06/28/2023 1525   GLUCOSE 406 (H) 06/28/2023 1525   BUN 14 06/28/2023 1525   CREATININE 0.86 06/28/2023 1525   CALCIUM 9.7 06/28/2023 1525   GFRNONAA >60 05/06/2023 2049   GFRAA >60 11/20/2019 0445    Lipid  Panel     Component Value Date/Time   CHOL 192 06/28/2023 1525   CHOL 157 10/26/2019 1203   TRIG 447 (H) 06/28/2023 1525    HDL 43 06/28/2023 1525   HDL 48 10/26/2019 1203   CHOLHDL 4.5 06/28/2023 1525   VLDL 60.2 (H) 01/01/2021 1047   LDLCALC  06/28/2023 1525     Comment:     . LDL cholesterol not calculated. Triglyceride levels greater than 400 mg/dL invalidate calculated LDL results. . Reference range: <100 . Desirable range <100 mg/dL for primary prevention;   <70 mg/dL for patients with CHD or diabetic patients  with > or = 2 CHD risk factors. Marland Kitchen LDL-C is now calculated using the Martin-Hopkins  calculation, which is a validated novel method providing  better accuracy than the Friedewald equation in the  estimation of LDL-C.  Horald Pollen et al. Lenox Ahr. 1610;960(45): 2061-2068  (http://education.[facility].com/faq/FAQ164)     CBC    Component Value Date/Time   WBC 4.3 06/28/2023 1525   RBC 5.65 06/28/2023 1525   HGB 14.8 06/28/2023 1525   HCT 46.5 06/28/2023 1525   PLT 236 06/28/2023 1525   MCV 82.3 06/28/2023 1525   MCH 26.2 (L) 06/28/2023 1525   MCHC 31.8 (L) 06/28/2023 1525   RDW 14.3 06/28/2023 1525   LYMPHSABS 1.9 05/06/2023 2049   MONOABS 0.5 05/06/2023 2049   EOSABS 0.1 05/06/2023 2049   BASOSABS 0.0 05/06/2023 2049    Hgb A1C Lab Results  Component Value Date   HGBA1C 8.6 (A) 06/28/2023          Assessment & Plan:   Urgent care follow-up for RUQ abdominal pain, headache, nausea and fever:  Urgent care notes reviewed  Cervical Disc Disease, tension headacce Exacerbation of chronic neck pain with associated headache. Pain is worse than usual and physical therapy has not been beneficial. -Administer Toradol injection today for acute pain relief. -Prescribe low-dose Prednisone for a longer duration, with caution due to patient's high A1C. -Prescribe muscle relaxer to be taken three times a day or at bedtime if working. -Advise patient to follow up with Dr. Myer Haff for further management.  Abdominal Pain, nausea, fever Resolved since urgent care visit. No  associated gastrointestinal symptoms. -COVID test negative -No further action required at this time. -Continue Zofran as needed.   RTC in 2 months, follow-up chronic conditions

## 2023-08-01 ENCOUNTER — Encounter: Payer: Self-pay | Admitting: Radiation Oncology

## 2023-08-01 ENCOUNTER — Ambulatory Visit
Admission: RE | Admit: 2023-08-01 | Discharge: 2023-08-01 | Disposition: A | Discharge status: Home / Self Care | Payer: 59 | Source: Ambulatory Visit | Attending: Radiation Oncology | Admitting: Radiation Oncology

## 2023-08-01 VITALS — BP 131/86 | HR 68 | Temp 97.4°F | Resp 16 | Wt 220.0 lb

## 2023-08-01 DIAGNOSIS — Z191 Hormone sensitive malignancy status: Secondary | ICD-10-CM | POA: Diagnosis not present

## 2023-08-01 DIAGNOSIS — C61 Malignant neoplasm of prostate: Secondary | ICD-10-CM | POA: Insufficient documentation

## 2023-08-01 NOTE — Progress Notes (Signed)
Radiation Oncology Follow up Note  Name: Marc Aguilar   Date:   08/01/2023 MRN:  [id number] DOB: 07-06-1961    This 62 y.o. 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  REFERRING PROVIDER: Lorre Munroe, NP  HPI: Patient is a 62 year old male I originally consulted for salvage radiation therapy.  He originally underwent a prostatectomy for Gleason 7 (4+3).  He had positive margins left posterior mid gland to base.  Recently his PSA has climbed from 0.1-0.2.  We ran a PSMA PET scan on him.  Which I have reviewed showing no evidence of local prostate cancer recurrence in the bed adenopathy in the pelvis or periaortic retroperitoneum no evidence of visceral metastasis or skeletal metastasis.  He clinically is stable.  COMPLICATIONS OF TREATMENT: none  FOLLOW UP COMPLIANCE: keeps appointments   PHYSICAL EXAM:  BP 131/86   Pulse 68   Temp (!) 97.4 F (36.3 C) (Tympanic)   Resp 16   Wt 220 lb (99.8 kg)   BMI 30.68 kg/m  Well-developed well-nourished patient in NAD. HEENT reveals PERLA, EOMI, discs not visualized.  Oral cavity is clear. No oral mucosal lesions are identified. Neck is clear without evidence of cervical or supraclavicular adenopathy. Lungs are clear to A&P. Cardiac examination is essentially unremarkable with regular rate and rhythm without murmur rub or thrill. Abdomen is benign with no organomegaly or masses noted. Motor sensory and DTR levels are equal and symmetric in the upper and lower extremities. Cranial nerves II through XII are grossly intact. Proprioception is intact. No peripheral adenopathy or edema is identified. No motor or sensory levels are noted. Crude visual fields are within normal range.  RADIOLOGY RESULTS: PSMA PET scan reviewed compatible with above-stated findings  PLAN: Present time patient is  doing well clinically.  Based on his PSMA PET scan findings showing no evidence of gross metastatic or recurrent disease in the prostatic fossa I will proceed with salvage radiation therapy to his prostatic fossa and pelvic nodes.  Based on his [facility] nomogram he has an 18% chance of lymph node involvement.  Will treat to 12 Gray to his prostatic fossa and 50 Wallace Cullens to his pelvic nodes using IMRT treatment planning and delivery.  Risks and benefits of treatment including increased lower urinary tract symptoms diarrhea fatigue alteration blood counts skin reaction all were reviewed in detail with the patient.  I have personally 7 ordered CT simulation in about a week's time.  Patient also will be receiving 1 dose of Eligard during radiation.  I would like to take this opportunity to thank you for allowing me to participate in the care of your patient.Marc Miller, MD

## 2023-08-02 ENCOUNTER — Ambulatory Visit: Payer: No Typology Code available for payment source | Admitting: Urology

## 2023-08-03 DIAGNOSIS — F331 Major depressive disorder, recurrent, moderate: Secondary | ICD-10-CM | POA: Diagnosis not present

## 2023-08-04 ENCOUNTER — Other Ambulatory Visit: Payer: Self-pay | Admitting: Radiation Oncology

## 2023-08-04 DIAGNOSIS — C801 Malignant (primary) neoplasm, unspecified: Secondary | ICD-10-CM

## 2023-08-05 ENCOUNTER — Ambulatory Visit (INDEPENDENT_AMBULATORY_CARE_PROVIDER_SITE_OTHER): Payer: 59 | Admitting: Physician Assistant

## 2023-08-05 ENCOUNTER — Encounter: Payer: Self-pay | Admitting: Physician Assistant

## 2023-08-05 VITALS — BP 121/78 | HR 94

## 2023-08-05 DIAGNOSIS — C61 Malignant neoplasm of prostate: Secondary | ICD-10-CM

## 2023-08-05 MED ORDER — LEUPROLIDE ACETATE (6 MONTH) 45 MG ~~LOC~~ KIT
45.0000 mg | PACK | Freq: Once | SUBCUTANEOUS | Status: AC
Start: 2023-08-05 — End: 2023-08-05
  Administered 2023-08-05: 45 mg via SUBCUTANEOUS

## 2023-08-05 NOTE — Patient Instructions (Signed)
Please take the following dietary supplements for the duration of your hormone suppression therapy to reduce your risk for bone loss: -Calcium 1000-1200mg daily -Vitamin D 800-1000IU daily  

## 2023-08-05 NOTE — Progress Notes (Signed)
Eligard SubQ Injection   Due to Prostate Cancer patient is present today for a Eligard Injection.  Medication: Eligard 6 month Dose: 45 mg  Location: left lower abdomen Lot: [id number] Exp: 08/18/2024  Patient tolerated well, no complications were noted  Performed by: Domingo Cocking, RMA  Per Dr. Apolinar Junes pt received one time dose.  continue on Vitamin D 800-1000iu and Calcium 1000-1200mg  daily while on Androgen Deprivation Therapy.  PA approval dates:

## 2023-08-05 NOTE — Progress Notes (Signed)
Patient presented to clinic today for initiation of 6 months of ADT with Eligard per Dr. Rushie Chestnut.   Patient reports maintaining a healthy diet.  He has a history of diabetes on glipizide, hypertension, and hyperlipidemia.  We discussed the anticipated side effects of ADT today including hot flashes, decreased libido, erectile dysfunction, breast tenderness or size changes, fatigue, weight gain, bone loss, muscle mass loss, brain fog, increased cholesterol, increased blood pressure, increased blood sugar, and increased risk for stroke and heart attack.  I counseled him to maintain a healthy diet and continue regular exercise including weightbearing exercise to mitigate bone and muscle loss for the duration of therapy.  Additionally, I counseled him to start daily calcium 1000-1200mg  and vitamin D 800-1000IU supplements to reduce bone loss.  Lastly, I encouraged him to maintain regular follow-ups with his PCP for monitoring of his metabolic syndrome. He expressed understanding, all questions answered. Printed resources on ADT provided today.  Carman Ching, PA-C 08/05/23 3:38 PM  I spent 10 minutes on the day of the encounter to include pre-visit record review, face-to-face time with the patient, and post-visit ordering of tests.

## 2023-08-09 ENCOUNTER — Ambulatory Visit
Admission: RE | Admit: 2023-08-09 | Discharge: 2023-08-09 | Disposition: A | Discharge status: Home / Self Care | Payer: 59 | Source: Ambulatory Visit | Attending: Radiation Oncology | Admitting: Radiation Oncology

## 2023-08-09 ENCOUNTER — Ambulatory Visit: Payer: 59 | Admitting: Neurosurgery

## 2023-08-09 DIAGNOSIS — C61 Malignant neoplasm of prostate: Secondary | ICD-10-CM | POA: Diagnosis not present

## 2023-08-09 DIAGNOSIS — C801 Malignant (primary) neoplasm, unspecified: Secondary | ICD-10-CM

## 2023-08-09 DIAGNOSIS — Z191 Hormone sensitive malignancy status: Secondary | ICD-10-CM | POA: Diagnosis not present

## 2023-08-11 ENCOUNTER — Ambulatory Visit: Payer: 59 | Admitting: Neurosurgery

## 2023-08-11 DIAGNOSIS — C61 Malignant neoplasm of prostate: Secondary | ICD-10-CM | POA: Diagnosis not present

## 2023-08-11 DIAGNOSIS — Z191 Hormone sensitive malignancy status: Secondary | ICD-10-CM | POA: Diagnosis not present

## 2023-08-12 ENCOUNTER — Other Ambulatory Visit: Payer: Self-pay | Admitting: *Deleted

## 2023-08-12 DIAGNOSIS — C61 Malignant neoplasm of prostate: Secondary | ICD-10-CM

## 2023-08-15 ENCOUNTER — Ambulatory Visit: Payer: 59

## 2023-08-16 ENCOUNTER — Ambulatory Visit: Admission: RE | Admit: 2023-08-16 | Payer: 59 | Source: Ambulatory Visit

## 2023-08-16 ENCOUNTER — Ambulatory Visit: Payer: 59

## 2023-08-17 ENCOUNTER — Other Ambulatory Visit: Payer: Self-pay

## 2023-08-17 ENCOUNTER — Ambulatory Visit
Admission: RE | Admit: 2023-08-17 | Discharge: 2023-08-17 | Disposition: A | Discharge status: Home / Self Care | Payer: 59 | Source: Ambulatory Visit | Attending: Radiation Oncology | Admitting: Radiation Oncology

## 2023-08-17 DIAGNOSIS — Z510 Encounter for antineoplastic radiation therapy: Secondary | ICD-10-CM | POA: Diagnosis not present

## 2023-08-17 DIAGNOSIS — C61 Malignant neoplasm of prostate: Secondary | ICD-10-CM | POA: Diagnosis not present

## 2023-08-17 DIAGNOSIS — Z191 Hormone sensitive malignancy status: Secondary | ICD-10-CM | POA: Diagnosis not present

## 2023-08-17 LAB — RAD ONC ARIA SESSION SUMMARY
Course Elapsed Days: 0
Plan Fractions Treated to Date: 1
Plan Prescribed Dose Per Fraction: 2 Gy
Plan Total Fractions Prescribed: 35
Plan Total Prescribed Dose: 70 Gy
Reference Point Dosage Given to Date: 2 Gy
Reference Point Session Dosage Given: 2 Gy
Session Number: 1

## 2023-08-18 ENCOUNTER — Other Ambulatory Visit: Payer: Self-pay

## 2023-08-18 ENCOUNTER — Encounter: Payer: Self-pay | Admitting: Neurosurgery

## 2023-08-18 ENCOUNTER — Ambulatory Visit
Admission: RE | Admit: 2023-08-18 | Discharge: 2023-08-18 | Disposition: A | Discharge status: Home / Self Care | Payer: 59 | Source: Ambulatory Visit | Attending: Radiation Oncology | Admitting: Radiation Oncology

## 2023-08-18 ENCOUNTER — Ambulatory Visit (INDEPENDENT_AMBULATORY_CARE_PROVIDER_SITE_OTHER): Payer: 59 | Admitting: Neurosurgery

## 2023-08-18 VITALS — BP 130/84 | Ht 71.0 in | Wt 220.0 lb

## 2023-08-18 DIAGNOSIS — M4802 Spinal stenosis, cervical region: Secondary | ICD-10-CM | POA: Diagnosis not present

## 2023-08-18 DIAGNOSIS — Z510 Encounter for antineoplastic radiation therapy: Secondary | ICD-10-CM | POA: Diagnosis not present

## 2023-08-18 DIAGNOSIS — M5412 Radiculopathy, cervical region: Secondary | ICD-10-CM | POA: Diagnosis not present

## 2023-08-18 DIAGNOSIS — C61 Malignant neoplasm of prostate: Secondary | ICD-10-CM | POA: Diagnosis not present

## 2023-08-18 DIAGNOSIS — Z191 Hormone sensitive malignancy status: Secondary | ICD-10-CM | POA: Diagnosis not present

## 2023-08-18 LAB — RAD ONC ARIA SESSION SUMMARY
Course Elapsed Days: 1
Plan Fractions Treated to Date: 2
Plan Prescribed Dose Per Fraction: 2 Gy
Plan Total Fractions Prescribed: 35
Plan Total Prescribed Dose: 70 Gy
Reference Point Dosage Given to Date: 4 Gy
Reference Point Session Dosage Given: 2 Gy
Session Number: 2

## 2023-08-18 NOTE — Addendum Note (Signed)
Addended by: Sharlot Gowda on: 08/18/2023 10:18 AM   Modules accepted: Orders

## 2023-08-18 NOTE — Patient Instructions (Signed)
Please see below for information in regards to your upcoming surgery:   Planned surgery: C4-6 arthroplasty   Surgery date: 10/10/23 at [facility]: [address] [place], [place] [address]) - you will find out your arrival time the business day before your surgery.   Pre-op appointment at [facility] Pre-admit Testing: we will call you with a date/time for this. If you are scheduled for an in person appointment, Pre-admit Testing is located on the first floor of the [facility] building, [address], [address]. Please bring all prescriptions in the original prescription bottles to your appointment. During this appointment, they will advise you which medications you can take the morning of surgery, and which medications you will need to hold for surgery. Labs (such as blood work, EKG) may be done at your pre-op appointment. You are not required to fast for these labs. Should you need to change your pre-op appointment, please call Pre-admit testing at [phone].     Blood thinners:    Aspirin:   stop aspirin 325mg  7 days prior to surgery, can resume 14 days after surgery    Surgical clearance: we will send a clearance form to Nicki Reaper, NP. They may wish to see you in their office prior to signing the clearance form. If so, they may call you to schedule an appointment.   Common restrictions after surgery: No bending, lifting, or twisting ("BLT"). Avoid lifting objects heavier than 10 pounds for the first 6 weeks after surgery. Where possible, avoid household activities that involve lifting, bending, reaching, pushing, or pulling such as laundry, vacuuming, grocery shopping, and childcare. Try to arrange for help from friends and family for these activities while you heal. Do not drive while taking prescription pain medication. Weeks 6 through 12 after surgery: avoid lifting more than 25 pounds.    X-rays after surgery: Because  you are having an arthroplasty: for appointments after your 2 week follow-up: please arrive at the [facility] outpatient imaging center ([address], [place]) or [facility] one hour prior to your appointment for x-rays. This applies to every appointment after your 2 week follow-up. Failure to do so may result in your appointment being rescheduled.   How to contact us:  If you have any questions/concerns before or after surgery, you can reach [place] at [phone], or you can send a mychart message. We can be reached by phone or mychart 8am-4pm, Monday-Friday.  *Please note: Calls after 4pm are forwarded to a third party answering service. Mychart messages are not routinely monitored during evenings, weekends, and holidays. Please call our office to contact the answering service for urgent concerns during non-business hours.     If you have FMLA/disability paperwork, please drop it off or fax it to [phone], attention Patty.   Appointments/FMLA & disability paperwork: Joycelyn Rua, & Flonnie Hailstone Registered Nurse/Surgery scheduler: Royston Cowper Medical Assistants: Nash Mantis Physician Assistants: Manning Charity, PA-C & Drake Leach, PA-C Surgeons: Venetia Night, MD & Ernestine Mcmurray, MD

## 2023-08-18 NOTE — Addendum Note (Signed)
Addended by: Sharlot Gowda on: 08/18/2023 10:25 AM   Modules accepted: Orders

## 2023-08-18 NOTE — Progress Notes (Signed)
Referring Physician:  Lorre Munroe, NP [address] [place],  [place] [address]  Primary Physician:  Lorre Munroe, NP  History of Present Illness: 08/18/2023 [name] to see me.  He has continued pain worse in his left arm than right arm.  He continues to have neck pain.  He has tried physical therapy without improvement.  He continues to have radicular symptoms.  He has numbness in his hands.  He is having some discomfort in his left forearm.  Additionally, he has numbness in his hands in a C6 distribution at times.  02/08/2023 I attempted to call for different times without any answer. We will reschedule appointment.  12/16/2022 Marc Aguilar is here today with a chief complaint of neck pain and this in his arms.  He has been having problems for approximate 1 year.  I previously saw him in June 2023 after which she had a right shoulder surgery which she has done well from.  He still has some stiffness in his arm with movement of his shoulder, but it is much better than it was.  He has had injections in his shoulder which helped.  He had a suprascapular injection in December which helped his pain.  He also has had an epidural steroid injection which helped.  He has pain between 2 and 10 out of 10.  Laying on his back makes his hands go numb.  All 5 fingers go numb at times on the right greater than left hand.  Bowel/Bladder Dysfunction: none  Conservative measures:  Physical therapy:  yes  kc in [place] just abot three weeks ago Multimodal medical therapy including regular antiinflammatories:   acetaminophen, oxycodone, and trazodone.   Injections:  epidural steroid injections 10/04/22 Right Suprascapular Nerve Block  06/30/22 C7- T1 ESI  Past Surgery: Shoulder repair on right side 05/2022  Marc Aguilar has no symptoms of cervical myelopathy.  The symptoms are causing a significant impact on the patient's life.   I have utilized the care everywhere function in epic to  review the outside records available from external health systems.  Review of Systems:  A 10 point review of systems is negative, except for the pertinent positives and negatives detailed in the HPI.  Past Medical History: Past Medical History:  Diagnosis Date   Anxiety    Arthritis    "knees and hips" (02/28/2018)   Cancer (HCC)    prostate    Chicken pox    Depression    GERD (gastroesophageal reflux disease)    Heart murmur    Hernia, abdominal    High cholesterol    History of gout    Hypertension    Migraine    "probably monthly" (02/28/2018)   Motion sickness    ocean boats, circular motion   Neck pain    OSA on CPAP    Pneumonia    "once" (02/28/2018)   Refusal of blood transfusions as patient is Jehovah's Witness    Seasonal allergies    Type II diabetes mellitus (HCC)     Past Surgical History: Past Surgical History:  Procedure Laterality Date   CARDIAC CATHETERIZATION  02/28/2018   ESOPHAGOGASTRODUODENOSCOPY (EGD) WITH PROPOFOL N/A 03/17/2023   Procedure: ESOPHAGOGASTRODUODENOSCOPY (EGD) WITH PROPOFOL;  Surgeon: [name], MD;  Location: [facility];  Service: Endoscopy;  Laterality: N/A;  Diabetic   ESOPHAGOGASTRODUODENOSCOPY (EGD) WITH PROPOFOL N/A 04/18/2023   Procedure: ESOPHAGOGASTRODUODENOSCOPY (EGD) WITH PROPOFOL;  Surgeon: [name], MD;  Location:  [facility];  Service: Endoscopy;  Laterality: N/A;   LEFT HEART CATH AND CORONARY ANGIOGRAPHY N/A 02/28/2018   Procedure: LEFT HEART CATH AND CORONARY ANGIOGRAPHY;  Surgeon: Tonny Bollman, MD;  Location: [facility];  Service: Cardiovascular;  Laterality: N/A;   ROBOT ASSISTED LAPAROSCOPIC RADICAL PROSTATECTOMY N/A 11/19/2019   Procedure: XI ROBOTIC ASSISTED LAPAROSCOPIC RADICAL PROSTATECTOMY WITH LYMPH NODE DISSECTION;  Surgeon: Vanna Scotland, MD;  Location: [facility];  Service: Urology;  Laterality: N/A;   SHOULDER ARTHROSCOPY WITH ROTATOR CUFF REPAIR AND SUBACROMIAL DECOMPRESSION  Right 05/21/2022   Procedure: Right shoulder arthroscopic rotator cuff repair (supraspinatus & subscapularis), subacromial decompression, and biceps tenodesis;  Surgeon: Signa Kell, MD;  Location: [facility];  Service: Orthopedics;  Laterality: Right;   WRIST GANGLION EXCISION Left 1984    Allergies: Allergies as of 08/18/2023 - Review Complete 08/18/2023  Allergen Reaction Noted   Tape Rash 05/04/2016    Medications: Current Meds  Medication Sig   albuterol (VENTOLIN HFA) 108 (90 Base) MCG/ACT inhaler Inhale 2 puffs into the lungs every 6 (six) hours as needed for wheezing or shortness of breath.   Alcohol Swabs (ALCOHOL PREP) PADS 1 each by Does not apply route 2 (two) times daily.   amLODipine (NORVASC) 10 MG tablet Take 1 tablet (10 mg total) by mouth daily.   atorvastatin (LIPITOR) 20 MG tablet Take 1 tablet (20 mg total) by mouth daily. (Patient taking differently: Take 20 mg by mouth at bedtime.)   Blood Glucose Monitoring Suppl (BLOOD GLUCOSE MONITOR SYSTEM) w/Device KIT Use as directed to check blood sugar once daily.   buPROPion (WELLBUTRIN XL) 300 MG 24 hr tablet Take 1 tablet (300 mg total) by mouth daily.   citalopram (CELEXA) 40 MG tablet Take 1 tablet (40 mg total) by mouth daily.   ezetimibe (ZETIA) 10 MG tablet Take 1 tablet (10 mg total) by mouth daily.   glipiZIDE (GLUCOTROL XL) 10 MG 24 hr tablet Take 1 tablet (10 mg total) by mouth daily with breakfast.   Glucose Blood (BLOOD GLUCOSE TEST STRIPS) STRP Use to check blood sugar once a day.   Lancet Device MISC Use to check blood sugar once a day.  DX: E11.9 May substitute to any manufacturer covered by patient's insurance.   Lancets (FREESTYLE) lancets Use as directed to check blood sugar once daily.   Lancets MISC 1 each by Does not apply route in the morning and at bedtime.   methocarbamol (ROBAXIN) 500 MG tablet Take 1 tablet (500 mg total) by mouth every 8 (eight) hours as needed for muscle spasms.   ondansetron  (ZOFRAN-ODT) 4 MG disintegrating tablet Take 1 tablet (4 mg total) by mouth every 8 (eight) hours as needed for nausea or vomiting.   pantoprazole (PROTONIX) 40 MG tablet Take 1 tablet (40 mg total) by mouth 2 (two) times daily before a meal.   tadalafil (CIALIS) 20 MG tablet Take 1 tablet (20 mg total) by mouth daily as needed for erectile dysfunction.   traZODone (DESYREL) 100 MG tablet Take 2 tablets (200 mg total) by mouth at bedtime.    Social History: Social History   Tobacco Use   Smoking status: Former    Current packs/day: 0.00    Average packs/day: 1 pack/day for 27.0 years (27.0 ttl pk-yrs)    Types: Cigarettes    Start date: 10/18/1982    Quit date: 10/18/2009    Years since quitting: 13.8   Smokeless tobacco: Former    Types: Sports administrator  Quit date: 10/18/1994  Vaping Use   Vaping status: Never Used  Substance Use Topics   Alcohol use: Yes    Alcohol/week: 21.0 standard drinks of alcohol    Types: 21 Cans of beer per week   Drug use: Never    Family Medical History: Family History  Problem Relation Age of Onset   Diabetes Mother    Hyperlipidemia Mother    Bladder Cancer Father    Liver cancer Sister    Diabetes Sister    Diabetes Maternal Aunt    Arthritis Maternal Grandmother    Diabetes Maternal Grandmother    Arthritis Maternal Grandfather    Arthritis Paternal Grandmother    Diabetes Brother    Heart disease Brother    Colon cancer Neg Hx     Physical Examination: Vitals:   08/18/23 0924  BP: 130/84    MAEW with 5/5 throughout  Slightly diminished sensation L C6 distribution   Medical Decision Making  Imaging: MRI C spine 02/16/2022 osteoporosis of 8:12 AM: Prior total visit cardiovascular Disc levels:   Multilevel disc degeneration, greatest at C4-C5 (mild-to-moderate) and C5-C6 (moderate).   C2-C3: Trace grade 1 anterolisthesis. No significant disc herniation or stenosis.   C3-C4: Small central disc protrusion. Uncovertebral  hypertrophy (predominantly on the right). The disc protrusion results in mild focal effacement of the ventral thecal sac and may contact the ventral spinal cord. Mild right neural foraminal narrowing.   C4-C5: Trace grade 1 retrolisthesis. Disc bulge. Uncovertebral hypertrophy (predominantly on the right). The disc protrusion effaces the ventral thecal sac, mildly narrowing the spinal canal and contacting the ventral spinal cord. Moderate right neural foraminal narrowing.   C5-C6: Disc bulge with left greater than right uncovertebral hypertrophy. Mild relative spinal canal narrowing (without significant spinal cord mass effect). Bilateral neural foraminal narrowing (moderate right, moderate/severe left).   C6-C7: No significant disc herniation or stenosis.   C7-T1: Mild facet arthrosis on the left. No significant disc herniation or stenosis.   IMPRESSION: Cervical spondylosis, as outlined.   No more than mild spinal canal stenosis.   Multilevel foraminal stenosis, as detailed and greatest on the right at C4-C5 (moderate) and bilaterally at C5-C6 (moderate right, moderate/severe left).   Disc degeneration is greatest at C4-C5 (mild-to-moderate) and C5-C6 (moderate).   Nonspecific straightening of the expected cervical lordosis.     Electronically Signed   By: Jackey Loge D.O.   On: 03/10/2022 18:57    I have personally reviewed the images and agree with the above interpretation.  Assessment and Plan: Marc Aguilar is a pleasant 62 y.o. male with neck pain and likely cervical radiculopathy.  He has involvement at C4-5 and C5-6.  He has severe neuroforaminal compression at C5-6 and moderate at C4-5.  He has tried and failed conservative management with physical therapy and medications.  I think he is a candidate for cervical disc arthroplasty at C4-6.  I would like to update his imaging as his MRI scan is a year and a half old.  If his imaging is consistent, we will plan for  cervical disc arthroplasty as noted above.  I discussed the planned procedure at length with the patient, including the risks, benefits, alternatives, and indications. The risks discussed include but are not limited to bleeding, infection, need for reoperation, spinal fluid leak, stroke, vision loss, anesthetic complication, coma, paralysis, and even death. We also discussed the possibility of post-operative dysphagia, vocal cord paralysis, and the risk of adjacent segment disease in the future.  I also described in detail that improvement was not guaranteed.  The patient expressed understanding of these risks, and asked that we proceed with surgery. I described the surgery in layman's terms, and gave ample opportunity for questions, which were answered to the best of my ability.  I spent a total of 30 minutes in this patient's care today. This time was spent reviewing pertinent records including imaging studies, obtaining and confirming history, performing a directed evaluation, formulating and discussing my recommendations, and documenting the visit within the medical record.     Dwayna Kentner K. Myer Haff MD, [facility]

## 2023-08-19 ENCOUNTER — Other Ambulatory Visit: Payer: Self-pay

## 2023-08-19 ENCOUNTER — Ambulatory Visit
Admission: RE | Admit: 2023-08-19 | Discharge: 2023-08-19 | Disposition: A | Discharge status: Home / Self Care | Payer: 59 | Source: Ambulatory Visit | Attending: Radiation Oncology | Admitting: Radiation Oncology

## 2023-08-19 DIAGNOSIS — C61 Malignant neoplasm of prostate: Secondary | ICD-10-CM | POA: Insufficient documentation

## 2023-08-19 DIAGNOSIS — Z510 Encounter for antineoplastic radiation therapy: Secondary | ICD-10-CM | POA: Insufficient documentation

## 2023-08-19 DIAGNOSIS — Z191 Hormone sensitive malignancy status: Secondary | ICD-10-CM | POA: Diagnosis not present

## 2023-08-19 LAB — RAD ONC ARIA SESSION SUMMARY
Course Elapsed Days: 2
Plan Fractions Treated to Date: 3
Plan Prescribed Dose Per Fraction: 2 Gy
Plan Total Fractions Prescribed: 35
Plan Total Prescribed Dose: 70 Gy
Reference Point Dosage Given to Date: 6 Gy
Reference Point Session Dosage Given: 2 Gy
Session Number: 3

## 2023-08-22 ENCOUNTER — Other Ambulatory Visit: Payer: Self-pay

## 2023-08-22 ENCOUNTER — Ambulatory Visit
Admission: RE | Admit: 2023-08-22 | Discharge: 2023-08-22 | Disposition: A | Discharge status: Home / Self Care | Payer: 59 | Source: Ambulatory Visit | Attending: Radiation Oncology | Admitting: Radiation Oncology

## 2023-08-22 ENCOUNTER — Inpatient Hospital Stay: Payer: 59 | Attending: Radiation Oncology

## 2023-08-22 DIAGNOSIS — Z510 Encounter for antineoplastic radiation therapy: Secondary | ICD-10-CM | POA: Diagnosis not present

## 2023-08-22 DIAGNOSIS — Z191 Hormone sensitive malignancy status: Secondary | ICD-10-CM | POA: Diagnosis not present

## 2023-08-22 DIAGNOSIS — C61 Malignant neoplasm of prostate: Secondary | ICD-10-CM | POA: Diagnosis not present

## 2023-08-22 LAB — RAD ONC ARIA SESSION SUMMARY
Course Elapsed Days: 5
Plan Fractions Treated to Date: 4
Plan Prescribed Dose Per Fraction: 2 Gy
Plan Total Fractions Prescribed: 35
Plan Total Prescribed Dose: 70 Gy
Reference Point Dosage Given to Date: 8 Gy
Reference Point Session Dosage Given: 2 Gy
Session Number: 4

## 2023-08-22 LAB — CBC (CANCER CENTER ONLY)
HCT: 37.9 % — ABNORMAL LOW (ref 39.0–52.0)
Hemoglobin: 12.6 g/dL — ABNORMAL LOW (ref 13.0–17.0)
MCH: 26.2 pg (ref 26.0–34.0)
MCHC: 33.2 g/dL (ref 30.0–36.0)
MCV: 78.8 fL — ABNORMAL LOW (ref 80.0–100.0)
Platelet Count: 153 10*3/uL (ref 150–400)
RBC: 4.81 MIL/uL (ref 4.22–5.81)
RDW: 14.1 % (ref 11.5–15.5)
WBC Count: 3.6 10*3/uL — ABNORMAL LOW (ref 4.0–10.5)
nRBC: 0 % (ref 0.0–0.2)

## 2023-08-23 ENCOUNTER — Telehealth: Payer: Self-pay

## 2023-08-23 ENCOUNTER — Ambulatory Visit
Admission: RE | Admit: 2023-08-23 | Discharge: 2023-08-23 | Disposition: A | Discharge status: Home / Self Care | Payer: 59 | Source: Ambulatory Visit | Attending: Radiation Oncology | Admitting: Radiation Oncology

## 2023-08-23 ENCOUNTER — Other Ambulatory Visit: Payer: Self-pay

## 2023-08-23 DIAGNOSIS — Z510 Encounter for antineoplastic radiation therapy: Secondary | ICD-10-CM | POA: Diagnosis not present

## 2023-08-23 DIAGNOSIS — C61 Malignant neoplasm of prostate: Secondary | ICD-10-CM | POA: Diagnosis not present

## 2023-08-23 DIAGNOSIS — Z191 Hormone sensitive malignancy status: Secondary | ICD-10-CM | POA: Diagnosis not present

## 2023-08-23 LAB — RAD ONC ARIA SESSION SUMMARY
Course Elapsed Days: 6
Plan Fractions Treated to Date: 5
Plan Prescribed Dose Per Fraction: 2 Gy
Plan Total Fractions Prescribed: 35
Plan Total Prescribed Dose: 70 Gy
Reference Point Dosage Given to Date: 10 Gy
Reference Point Session Dosage Given: 2 Gy
Session Number: 5

## 2023-08-23 NOTE — Telephone Encounter (Signed)
Received signed clearance form from Marc Reaper, NP stating that Marc Aguilar's most recent A1C is 8.6% and he is due for recheck on 09/27/23. Per discussion with Marc Aguilar, it will need to be 8 or less for surgery. I have sent Marc Aguilar a Earleen Aguilar message to notify him of this.

## 2023-08-24 ENCOUNTER — Other Ambulatory Visit: Payer: Self-pay

## 2023-08-24 ENCOUNTER — Ambulatory Visit
Admission: RE | Admit: 2023-08-24 | Discharge: 2023-08-24 | Disposition: A | Discharge status: Home / Self Care | Payer: 59 | Source: Ambulatory Visit | Attending: Radiation Oncology | Admitting: Radiation Oncology

## 2023-08-24 DIAGNOSIS — Z191 Hormone sensitive malignancy status: Secondary | ICD-10-CM | POA: Diagnosis not present

## 2023-08-24 DIAGNOSIS — C61 Malignant neoplasm of prostate: Secondary | ICD-10-CM | POA: Diagnosis not present

## 2023-08-24 DIAGNOSIS — F331 Major depressive disorder, recurrent, moderate: Secondary | ICD-10-CM | POA: Diagnosis not present

## 2023-08-24 DIAGNOSIS — Z510 Encounter for antineoplastic radiation therapy: Secondary | ICD-10-CM | POA: Diagnosis not present

## 2023-08-24 LAB — RAD ONC ARIA SESSION SUMMARY
Course Elapsed Days: 7
Plan Fractions Treated to Date: 6
Plan Prescribed Dose Per Fraction: 2 Gy
Plan Total Fractions Prescribed: 35
Plan Total Prescribed Dose: 70 Gy
Reference Point Dosage Given to Date: 12 Gy
Reference Point Session Dosage Given: 2 Gy
Session Number: 6

## 2023-08-25 ENCOUNTER — Ambulatory Visit
Admission: RE | Admit: 2023-08-25 | Discharge: 2023-08-25 | Disposition: A | Discharge status: Home / Self Care | Payer: 59 | Source: Ambulatory Visit | Attending: Radiation Oncology | Admitting: Radiation Oncology

## 2023-08-25 ENCOUNTER — Other Ambulatory Visit: Payer: Self-pay

## 2023-08-25 DIAGNOSIS — C61 Malignant neoplasm of prostate: Secondary | ICD-10-CM | POA: Diagnosis not present

## 2023-08-25 DIAGNOSIS — Z191 Hormone sensitive malignancy status: Secondary | ICD-10-CM | POA: Diagnosis not present

## 2023-08-25 DIAGNOSIS — Z510 Encounter for antineoplastic radiation therapy: Secondary | ICD-10-CM | POA: Diagnosis not present

## 2023-08-25 LAB — RAD ONC ARIA SESSION SUMMARY
Course Elapsed Days: 8
Plan Fractions Treated to Date: 7
Plan Prescribed Dose Per Fraction: 2 Gy
Plan Total Fractions Prescribed: 35
Plan Total Prescribed Dose: 70 Gy
Reference Point Dosage Given to Date: 14 Gy
Reference Point Session Dosage Given: 2 Gy
Session Number: 7

## 2023-08-26 ENCOUNTER — Ambulatory Visit
Admission: RE | Admit: 2023-08-26 | Discharge: 2023-08-26 | Disposition: A | Discharge status: Home / Self Care | Payer: 59 | Source: Ambulatory Visit | Attending: Radiation Oncology | Admitting: Radiation Oncology

## 2023-08-26 ENCOUNTER — Other Ambulatory Visit: Payer: Self-pay

## 2023-08-26 DIAGNOSIS — C61 Malignant neoplasm of prostate: Secondary | ICD-10-CM | POA: Diagnosis not present

## 2023-08-26 DIAGNOSIS — Z510 Encounter for antineoplastic radiation therapy: Secondary | ICD-10-CM | POA: Diagnosis not present

## 2023-08-26 DIAGNOSIS — Z191 Hormone sensitive malignancy status: Secondary | ICD-10-CM | POA: Diagnosis not present

## 2023-08-26 LAB — RAD ONC ARIA SESSION SUMMARY
Course Elapsed Days: 9
Plan Fractions Treated to Date: 8
Plan Prescribed Dose Per Fraction: 2 Gy
Plan Total Fractions Prescribed: 35
Plan Total Prescribed Dose: 70 Gy
Reference Point Dosage Given to Date: 16 Gy
Reference Point Session Dosage Given: 2 Gy
Session Number: 8

## 2023-08-27 ENCOUNTER — Ambulatory Visit (HOSPITAL_COMMUNITY)
Admission: RE | Admit: 2023-08-27 | Discharge: 2023-08-27 | Disposition: A | Discharge status: Home / Self Care | Payer: 59 | Source: Ambulatory Visit | Attending: Neurosurgery | Admitting: Neurosurgery

## 2023-08-27 DIAGNOSIS — M4802 Spinal stenosis, cervical region: Secondary | ICD-10-CM | POA: Diagnosis not present

## 2023-08-27 DIAGNOSIS — M5412 Radiculopathy, cervical region: Secondary | ICD-10-CM | POA: Insufficient documentation

## 2023-08-27 DIAGNOSIS — M5010 Cervical disc disorder with radiculopathy, unspecified cervical region: Secondary | ICD-10-CM | POA: Diagnosis not present

## 2023-08-27 DIAGNOSIS — M4722 Other spondylosis with radiculopathy, cervical region: Secondary | ICD-10-CM | POA: Diagnosis not present

## 2023-08-29 ENCOUNTER — Other Ambulatory Visit: Payer: Self-pay

## 2023-08-29 ENCOUNTER — Ambulatory Visit
Admission: RE | Admit: 2023-08-29 | Discharge: 2023-08-29 | Disposition: A | Discharge status: Home / Self Care | Payer: 59 | Source: Ambulatory Visit | Attending: Radiation Oncology | Admitting: Radiation Oncology

## 2023-08-29 DIAGNOSIS — Z191 Hormone sensitive malignancy status: Secondary | ICD-10-CM | POA: Diagnosis not present

## 2023-08-29 DIAGNOSIS — Z510 Encounter for antineoplastic radiation therapy: Secondary | ICD-10-CM | POA: Diagnosis not present

## 2023-08-29 DIAGNOSIS — C61 Malignant neoplasm of prostate: Secondary | ICD-10-CM | POA: Diagnosis not present

## 2023-08-29 DIAGNOSIS — Z01818 Encounter for other preprocedural examination: Secondary | ICD-10-CM

## 2023-08-29 LAB — RAD ONC ARIA SESSION SUMMARY
Course Elapsed Days: 12
Plan Fractions Treated to Date: 9
Plan Prescribed Dose Per Fraction: 2 Gy
Plan Total Fractions Prescribed: 35
Plan Total Prescribed Dose: 70 Gy
Reference Point Dosage Given to Date: 18 Gy
Reference Point Session Dosage Given: 2 Gy
Session Number: 9

## 2023-08-30 ENCOUNTER — Ambulatory Visit
Admission: RE | Admit: 2023-08-30 | Discharge: 2023-08-30 | Disposition: A | Discharge status: Home / Self Care | Payer: 59 | Source: Ambulatory Visit | Attending: Radiation Oncology | Admitting: Radiation Oncology

## 2023-08-30 ENCOUNTER — Other Ambulatory Visit: Payer: Self-pay

## 2023-08-30 DIAGNOSIS — Z191 Hormone sensitive malignancy status: Secondary | ICD-10-CM | POA: Diagnosis not present

## 2023-08-30 DIAGNOSIS — Z510 Encounter for antineoplastic radiation therapy: Secondary | ICD-10-CM | POA: Diagnosis not present

## 2023-08-30 DIAGNOSIS — C61 Malignant neoplasm of prostate: Secondary | ICD-10-CM | POA: Diagnosis not present

## 2023-08-30 LAB — RAD ONC ARIA SESSION SUMMARY
Course Elapsed Days: 13
Plan Fractions Treated to Date: 10
Plan Prescribed Dose Per Fraction: 2 Gy
Plan Total Fractions Prescribed: 35
Plan Total Prescribed Dose: 70 Gy
Reference Point Dosage Given to Date: 20 Gy
Reference Point Session Dosage Given: 2 Gy
Session Number: 10

## 2023-08-31 ENCOUNTER — Ambulatory Visit
Admission: RE | Admit: 2023-08-31 | Discharge: 2023-08-31 | Disposition: A | Discharge status: Home / Self Care | Payer: 59 | Source: Ambulatory Visit | Attending: Radiation Oncology | Admitting: Radiation Oncology

## 2023-08-31 ENCOUNTER — Other Ambulatory Visit: Payer: Self-pay

## 2023-08-31 DIAGNOSIS — Z510 Encounter for antineoplastic radiation therapy: Secondary | ICD-10-CM | POA: Diagnosis not present

## 2023-08-31 DIAGNOSIS — Z191 Hormone sensitive malignancy status: Secondary | ICD-10-CM | POA: Diagnosis not present

## 2023-08-31 DIAGNOSIS — C61 Malignant neoplasm of prostate: Secondary | ICD-10-CM | POA: Diagnosis not present

## 2023-08-31 LAB — RAD ONC ARIA SESSION SUMMARY
Course Elapsed Days: 14
Plan Fractions Treated to Date: 11
Plan Prescribed Dose Per Fraction: 2 Gy
Plan Total Fractions Prescribed: 35
Plan Total Prescribed Dose: 70 Gy
Reference Point Dosage Given to Date: 22 Gy
Reference Point Session Dosage Given: 2 Gy
Session Number: 11

## 2023-09-01 ENCOUNTER — Ambulatory Visit
Admission: RE | Admit: 2023-09-01 | Discharge: 2023-09-01 | Disposition: A | Discharge status: Home / Self Care | Payer: 59 | Source: Ambulatory Visit | Attending: Radiation Oncology | Admitting: Radiation Oncology

## 2023-09-01 ENCOUNTER — Telehealth: Payer: Self-pay

## 2023-09-01 ENCOUNTER — Other Ambulatory Visit: Payer: Self-pay

## 2023-09-01 DIAGNOSIS — Z191 Hormone sensitive malignancy status: Secondary | ICD-10-CM | POA: Diagnosis not present

## 2023-09-01 DIAGNOSIS — C61 Malignant neoplasm of prostate: Secondary | ICD-10-CM | POA: Diagnosis not present

## 2023-09-01 DIAGNOSIS — Z510 Encounter for antineoplastic radiation therapy: Secondary | ICD-10-CM | POA: Diagnosis not present

## 2023-09-01 LAB — RAD ONC ARIA SESSION SUMMARY
Course Elapsed Days: 15
Plan Fractions Treated to Date: 12
Plan Prescribed Dose Per Fraction: 2 Gy
Plan Total Fractions Prescribed: 35
Plan Total Prescribed Dose: 70 Gy
Reference Point Dosage Given to Date: 24 Gy
Reference Point Session Dosage Given: 2 Gy
Session Number: 12

## 2023-09-01 NOTE — Telephone Encounter (Signed)
Copied from CRM [id number]. Topic: General - Inquiry >> Sep 01, 2023  4:00 PM Lennox Pippins wrote: Patient wants to pick up a copy of his immunization summary that shows he had his flu shot this year for [employer]. Patient states he can not get to work if he does not have this.  Patient's callback [phone]

## 2023-09-02 ENCOUNTER — Ambulatory Visit
Admission: RE | Admit: 2023-09-02 | Discharge: 2023-09-02 | Disposition: A | Discharge status: Home / Self Care | Payer: 59 | Source: Ambulatory Visit | Attending: Radiation Oncology | Admitting: Radiation Oncology

## 2023-09-02 ENCOUNTER — Other Ambulatory Visit: Payer: Self-pay

## 2023-09-02 DIAGNOSIS — Z191 Hormone sensitive malignancy status: Secondary | ICD-10-CM | POA: Diagnosis not present

## 2023-09-02 DIAGNOSIS — C61 Malignant neoplasm of prostate: Secondary | ICD-10-CM | POA: Diagnosis not present

## 2023-09-02 DIAGNOSIS — Z510 Encounter for antineoplastic radiation therapy: Secondary | ICD-10-CM | POA: Diagnosis not present

## 2023-09-02 LAB — RAD ONC ARIA SESSION SUMMARY
Course Elapsed Days: 16
Plan Fractions Treated to Date: 13
Plan Prescribed Dose Per Fraction: 2 Gy
Plan Total Fractions Prescribed: 35
Plan Total Prescribed Dose: 70 Gy
Reference Point Dosage Given to Date: 26 Gy
Reference Point Session Dosage Given: 2 Gy
Session Number: 13

## 2023-09-05 ENCOUNTER — Inpatient Hospital Stay: Payer: 59

## 2023-09-05 ENCOUNTER — Ambulatory Visit
Admission: RE | Admit: 2023-09-05 | Discharge: 2023-09-05 | Disposition: A | Discharge status: Home / Self Care | Payer: 59 | Source: Ambulatory Visit | Attending: Radiation Oncology | Admitting: Radiation Oncology

## 2023-09-05 ENCOUNTER — Other Ambulatory Visit: Payer: Self-pay

## 2023-09-05 DIAGNOSIS — C61 Malignant neoplasm of prostate: Secondary | ICD-10-CM | POA: Diagnosis not present

## 2023-09-05 DIAGNOSIS — Z191 Hormone sensitive malignancy status: Secondary | ICD-10-CM | POA: Diagnosis not present

## 2023-09-05 DIAGNOSIS — Z510 Encounter for antineoplastic radiation therapy: Secondary | ICD-10-CM | POA: Diagnosis not present

## 2023-09-05 LAB — RAD ONC ARIA SESSION SUMMARY
Course Elapsed Days: 19
Plan Fractions Treated to Date: 14
Plan Prescribed Dose Per Fraction: 2 Gy
Plan Total Fractions Prescribed: 35
Plan Total Prescribed Dose: 70 Gy
Reference Point Dosage Given to Date: 28 Gy
Reference Point Session Dosage Given: 2 Gy
Session Number: 14

## 2023-09-05 LAB — CBC (CANCER CENTER ONLY)
HCT: 37.9 % — ABNORMAL LOW (ref 39.0–52.0)
Hemoglobin: 12.7 g/dL — ABNORMAL LOW (ref 13.0–17.0)
MCH: 26.7 pg (ref 26.0–34.0)
MCHC: 33.5 g/dL (ref 30.0–36.0)
MCV: 79.6 fL — ABNORMAL LOW (ref 80.0–100.0)
Platelet Count: 223 10*3/uL (ref 150–400)
RBC: 4.76 MIL/uL (ref 4.22–5.81)
RDW: 14.1 % (ref 11.5–15.5)
WBC Count: 3.4 10*3/uL — ABNORMAL LOW (ref 4.0–10.5)
nRBC: 0 % (ref 0.0–0.2)

## 2023-09-06 ENCOUNTER — Other Ambulatory Visit (HOSPITAL_COMMUNITY): Payer: Self-pay

## 2023-09-06 ENCOUNTER — Other Ambulatory Visit: Payer: Self-pay

## 2023-09-06 ENCOUNTER — Ambulatory Visit
Admission: RE | Admit: 2023-09-06 | Discharge: 2023-09-06 | Disposition: A | Discharge status: Home / Self Care | Payer: 59 | Source: Ambulatory Visit | Attending: Radiation Oncology | Admitting: Radiation Oncology

## 2023-09-06 DIAGNOSIS — Z510 Encounter for antineoplastic radiation therapy: Secondary | ICD-10-CM | POA: Diagnosis not present

## 2023-09-06 DIAGNOSIS — C61 Malignant neoplasm of prostate: Secondary | ICD-10-CM | POA: Diagnosis not present

## 2023-09-06 DIAGNOSIS — Z191 Hormone sensitive malignancy status: Secondary | ICD-10-CM | POA: Diagnosis not present

## 2023-09-06 LAB — RAD ONC ARIA SESSION SUMMARY
Course Elapsed Days: 20
Plan Fractions Treated to Date: 15
Plan Prescribed Dose Per Fraction: 2 Gy
Plan Total Fractions Prescribed: 35
Plan Total Prescribed Dose: 70 Gy
Reference Point Dosage Given to Date: 30 Gy
Reference Point Session Dosage Given: 2 Gy
Session Number: 15

## 2023-09-07 ENCOUNTER — Ambulatory Visit
Admission: RE | Admit: 2023-09-07 | Discharge: 2023-09-07 | Disposition: A | Discharge status: Home / Self Care | Payer: 59 | Source: Ambulatory Visit | Attending: Radiation Oncology | Admitting: Radiation Oncology

## 2023-09-07 ENCOUNTER — Other Ambulatory Visit: Payer: Self-pay

## 2023-09-07 DIAGNOSIS — Z510 Encounter for antineoplastic radiation therapy: Secondary | ICD-10-CM | POA: Diagnosis not present

## 2023-09-07 DIAGNOSIS — Z191 Hormone sensitive malignancy status: Secondary | ICD-10-CM | POA: Diagnosis not present

## 2023-09-07 DIAGNOSIS — C61 Malignant neoplasm of prostate: Secondary | ICD-10-CM | POA: Diagnosis not present

## 2023-09-07 DIAGNOSIS — F331 Major depressive disorder, recurrent, moderate: Secondary | ICD-10-CM | POA: Diagnosis not present

## 2023-09-07 LAB — RAD ONC ARIA SESSION SUMMARY
Course Elapsed Days: 21
Plan Fractions Treated to Date: 16
Plan Prescribed Dose Per Fraction: 2 Gy
Plan Total Fractions Prescribed: 35
Plan Total Prescribed Dose: 70 Gy
Reference Point Dosage Given to Date: 32 Gy
Reference Point Session Dosage Given: 2 Gy
Session Number: 16

## 2023-09-08 ENCOUNTER — Ambulatory Visit
Admission: RE | Admit: 2023-09-08 | Discharge: 2023-09-08 | Disposition: A | Discharge status: Home / Self Care | Payer: 59 | Source: Ambulatory Visit | Attending: Radiation Oncology | Admitting: Radiation Oncology

## 2023-09-08 ENCOUNTER — Other Ambulatory Visit: Payer: Self-pay

## 2023-09-08 DIAGNOSIS — Z191 Hormone sensitive malignancy status: Secondary | ICD-10-CM | POA: Diagnosis not present

## 2023-09-08 DIAGNOSIS — C61 Malignant neoplasm of prostate: Secondary | ICD-10-CM | POA: Diagnosis not present

## 2023-09-08 DIAGNOSIS — Z510 Encounter for antineoplastic radiation therapy: Secondary | ICD-10-CM | POA: Diagnosis not present

## 2023-09-08 LAB — RAD ONC ARIA SESSION SUMMARY
Course Elapsed Days: 22
Plan Fractions Treated to Date: 17
Plan Prescribed Dose Per Fraction: 2 Gy
Plan Total Fractions Prescribed: 35
Plan Total Prescribed Dose: 70 Gy
Reference Point Dosage Given to Date: 34 Gy
Reference Point Session Dosage Given: 2 Gy
Session Number: 17

## 2023-09-09 ENCOUNTER — Other Ambulatory Visit: Payer: Self-pay

## 2023-09-09 ENCOUNTER — Ambulatory Visit
Admission: RE | Admit: 2023-09-09 | Discharge: 2023-09-09 | Disposition: A | Discharge status: Home / Self Care | Payer: 59 | Source: Ambulatory Visit | Attending: Radiation Oncology | Admitting: Radiation Oncology

## 2023-09-09 DIAGNOSIS — Z191 Hormone sensitive malignancy status: Secondary | ICD-10-CM | POA: Diagnosis not present

## 2023-09-09 DIAGNOSIS — C61 Malignant neoplasm of prostate: Secondary | ICD-10-CM | POA: Diagnosis not present

## 2023-09-09 DIAGNOSIS — Z510 Encounter for antineoplastic radiation therapy: Secondary | ICD-10-CM | POA: Diagnosis not present

## 2023-09-09 LAB — RAD ONC ARIA SESSION SUMMARY
Course Elapsed Days: 23
Plan Fractions Treated to Date: 18
Plan Prescribed Dose Per Fraction: 2 Gy
Plan Total Fractions Prescribed: 35
Plan Total Prescribed Dose: 70 Gy
Reference Point Dosage Given to Date: 36 Gy
Reference Point Session Dosage Given: 2 Gy
Session Number: 18

## 2023-09-12 ENCOUNTER — Ambulatory Visit
Admission: RE | Admit: 2023-09-12 | Discharge: 2023-09-12 | Disposition: A | Discharge status: Home / Self Care | Payer: 59 | Source: Ambulatory Visit | Attending: Radiation Oncology | Admitting: Radiation Oncology

## 2023-09-12 ENCOUNTER — Other Ambulatory Visit: Payer: Self-pay

## 2023-09-12 DIAGNOSIS — Z191 Hormone sensitive malignancy status: Secondary | ICD-10-CM | POA: Diagnosis not present

## 2023-09-12 DIAGNOSIS — Z510 Encounter for antineoplastic radiation therapy: Secondary | ICD-10-CM | POA: Diagnosis not present

## 2023-09-12 DIAGNOSIS — C61 Malignant neoplasm of prostate: Secondary | ICD-10-CM | POA: Diagnosis not present

## 2023-09-12 LAB — RAD ONC ARIA SESSION SUMMARY
Course Elapsed Days: 26
Plan Fractions Treated to Date: 19
Plan Prescribed Dose Per Fraction: 2 Gy
Plan Total Fractions Prescribed: 35
Plan Total Prescribed Dose: 70 Gy
Reference Point Dosage Given to Date: 38 Gy
Reference Point Session Dosage Given: 2 Gy
Session Number: 19

## 2023-09-13 ENCOUNTER — Other Ambulatory Visit: Payer: Self-pay

## 2023-09-13 ENCOUNTER — Ambulatory Visit
Admission: RE | Admit: 2023-09-13 | Discharge: 2023-09-13 | Disposition: A | Discharge status: Home / Self Care | Payer: 59 | Source: Ambulatory Visit | Attending: Radiation Oncology | Admitting: Radiation Oncology

## 2023-09-13 DIAGNOSIS — Z191 Hormone sensitive malignancy status: Secondary | ICD-10-CM | POA: Diagnosis not present

## 2023-09-13 DIAGNOSIS — Z510 Encounter for antineoplastic radiation therapy: Secondary | ICD-10-CM | POA: Diagnosis not present

## 2023-09-13 DIAGNOSIS — C61 Malignant neoplasm of prostate: Secondary | ICD-10-CM | POA: Diagnosis not present

## 2023-09-13 LAB — RAD ONC ARIA SESSION SUMMARY
Course Elapsed Days: 27
Plan Fractions Treated to Date: 20
Plan Prescribed Dose Per Fraction: 2 Gy
Plan Total Fractions Prescribed: 35
Plan Total Prescribed Dose: 70 Gy
Reference Point Dosage Given to Date: 40 Gy
Reference Point Session Dosage Given: 2 Gy
Session Number: 20

## 2023-09-14 ENCOUNTER — Other Ambulatory Visit: Payer: Self-pay

## 2023-09-14 ENCOUNTER — Ambulatory Visit
Admission: RE | Admit: 2023-09-14 | Discharge: 2023-09-14 | Disposition: A | Discharge status: Home / Self Care | Payer: 59 | Source: Ambulatory Visit | Attending: Radiation Oncology | Admitting: Radiation Oncology

## 2023-09-14 DIAGNOSIS — C61 Malignant neoplasm of prostate: Secondary | ICD-10-CM | POA: Diagnosis not present

## 2023-09-14 DIAGNOSIS — Z191 Hormone sensitive malignancy status: Secondary | ICD-10-CM | POA: Diagnosis not present

## 2023-09-14 DIAGNOSIS — Z510 Encounter for antineoplastic radiation therapy: Secondary | ICD-10-CM | POA: Diagnosis not present

## 2023-09-14 LAB — RAD ONC ARIA SESSION SUMMARY
Course Elapsed Days: 28
Plan Fractions Treated to Date: 21
Plan Prescribed Dose Per Fraction: 2 Gy
Plan Total Fractions Prescribed: 35
Plan Total Prescribed Dose: 70 Gy
Reference Point Dosage Given to Date: 42 Gy
Reference Point Session Dosage Given: 2 Gy
Session Number: 21

## 2023-09-19 ENCOUNTER — Other Ambulatory Visit: Payer: Self-pay | Admitting: Internal Medicine

## 2023-09-19 ENCOUNTER — Inpatient Hospital Stay: Payer: 59 | Attending: Radiation Oncology

## 2023-09-19 ENCOUNTER — Ambulatory Visit
Admission: RE | Admit: 2023-09-19 | Discharge: 2023-09-19 | Disposition: A | Discharge status: Home / Self Care | Payer: 59 | Source: Ambulatory Visit | Attending: Radiation Oncology | Admitting: Radiation Oncology

## 2023-09-19 ENCOUNTER — Other Ambulatory Visit: Payer: Self-pay

## 2023-09-19 ENCOUNTER — Encounter: Payer: Self-pay | Admitting: Neurosurgery

## 2023-09-19 DIAGNOSIS — Z0001 Encounter for general adult medical examination with abnormal findings: Secondary | ICD-10-CM

## 2023-09-19 DIAGNOSIS — Z191 Hormone sensitive malignancy status: Secondary | ICD-10-CM | POA: Diagnosis not present

## 2023-09-19 DIAGNOSIS — Z510 Encounter for antineoplastic radiation therapy: Secondary | ICD-10-CM | POA: Diagnosis present

## 2023-09-19 DIAGNOSIS — C61 Malignant neoplasm of prostate: Secondary | ICD-10-CM | POA: Diagnosis present

## 2023-09-19 LAB — RAD ONC ARIA SESSION SUMMARY
Course Elapsed Days: 33
Plan Fractions Treated to Date: 22
Plan Prescribed Dose Per Fraction: 2 Gy
Plan Total Fractions Prescribed: 35
Plan Total Prescribed Dose: 70 Gy
Reference Point Dosage Given to Date: 44 Gy
Reference Point Session Dosage Given: 2 Gy
Session Number: 22

## 2023-09-19 LAB — CBC (CANCER CENTER ONLY)
HCT: 40 % (ref 39.0–52.0)
Hemoglobin: 13.4 g/dL (ref 13.0–17.0)
MCH: 27 pg (ref 26.0–34.0)
MCHC: 33.5 g/dL (ref 30.0–36.0)
MCV: 80.5 fL (ref 80.0–100.0)
Platelet Count: 205 10*3/uL (ref 150–400)
RBC: 4.97 MIL/uL (ref 4.22–5.81)
RDW: 14.5 % (ref 11.5–15.5)
WBC Count: 2.8 10*3/uL — ABNORMAL LOW (ref 4.0–10.5)
nRBC: 0 % (ref 0.0–0.2)

## 2023-09-20 ENCOUNTER — Ambulatory Visit
Admission: RE | Admit: 2023-09-20 | Discharge: 2023-09-20 | Disposition: A | Discharge status: Home / Self Care | Payer: 59 | Source: Ambulatory Visit | Attending: Radiation Oncology | Admitting: Radiation Oncology

## 2023-09-20 ENCOUNTER — Other Ambulatory Visit: Payer: Self-pay

## 2023-09-20 ENCOUNTER — Telehealth: Payer: Self-pay

## 2023-09-20 ENCOUNTER — Other Ambulatory Visit: Payer: Self-pay | Admitting: Internal Medicine

## 2023-09-20 DIAGNOSIS — C61 Malignant neoplasm of prostate: Secondary | ICD-10-CM | POA: Diagnosis not present

## 2023-09-20 DIAGNOSIS — Z191 Hormone sensitive malignancy status: Secondary | ICD-10-CM | POA: Diagnosis not present

## 2023-09-20 DIAGNOSIS — Z510 Encounter for antineoplastic radiation therapy: Secondary | ICD-10-CM | POA: Diagnosis not present

## 2023-09-20 DIAGNOSIS — Z0001 Encounter for general adult medical examination with abnormal findings: Secondary | ICD-10-CM

## 2023-09-20 LAB — RAD ONC ARIA SESSION SUMMARY
Course Elapsed Days: 34
Plan Fractions Treated to Date: 23
Plan Prescribed Dose Per Fraction: 2 Gy
Plan Total Fractions Prescribed: 35
Plan Total Prescribed Dose: 70 Gy
Reference Point Dosage Given to Date: 46 Gy
Reference Point Session Dosage Given: 2 Gy
Session Number: 23

## 2023-09-20 NOTE — Telephone Encounter (Signed)
-----   Message from [facility] sent at 09/19/2023  5:06 PM EST ----- Regarding: RE: surgical plan Plan is the same ----- Message ----- From: Sharlot Gowda, RN Sent: 09/14/2023   6:14 PM EST To: Venetia Night, MD Subject: surgical plan                                  Now that his updated MRI has resulted, please let me know if his surgical plan is still the same so I can submit his surgery auth ASAP (he has [employer]). Plan was C4-6 arthroplasty.   Your last note: "I think he is a candidate for cervical disc arthroplasty at C4-6.  I would like to update his imaging as his MRI scan is a year and a half old.  If his imaging is consistent, we will plan for cervical disc arthroplasty as noted above."  Thanks

## 2023-09-21 ENCOUNTER — Other Ambulatory Visit: Payer: Self-pay

## 2023-09-21 ENCOUNTER — Ambulatory Visit
Admission: RE | Admit: 2023-09-21 | Discharge: 2023-09-21 | Disposition: A | Discharge status: Home / Self Care | Payer: 59 | Source: Ambulatory Visit | Attending: Radiation Oncology | Admitting: Radiation Oncology

## 2023-09-21 DIAGNOSIS — Z510 Encounter for antineoplastic radiation therapy: Secondary | ICD-10-CM | POA: Diagnosis not present

## 2023-09-21 DIAGNOSIS — C61 Malignant neoplasm of prostate: Secondary | ICD-10-CM | POA: Diagnosis not present

## 2023-09-21 DIAGNOSIS — Z191 Hormone sensitive malignancy status: Secondary | ICD-10-CM | POA: Diagnosis not present

## 2023-09-21 LAB — RAD ONC ARIA SESSION SUMMARY
Course Elapsed Days: 35
Plan Fractions Treated to Date: 24
Plan Prescribed Dose Per Fraction: 2 Gy
Plan Total Fractions Prescribed: 35
Plan Total Prescribed Dose: 70 Gy
Reference Point Dosage Given to Date: 48 Gy
Reference Point Session Dosage Given: 2 Gy
Session Number: 24

## 2023-09-22 ENCOUNTER — Other Ambulatory Visit: Payer: Self-pay

## 2023-09-22 ENCOUNTER — Ambulatory Visit
Admission: RE | Admit: 2023-09-22 | Discharge: 2023-09-22 | Disposition: A | Discharge status: Home / Self Care | Payer: 59 | Source: Ambulatory Visit | Attending: Radiation Oncology | Admitting: Radiation Oncology

## 2023-09-22 DIAGNOSIS — Z191 Hormone sensitive malignancy status: Secondary | ICD-10-CM | POA: Diagnosis not present

## 2023-09-22 DIAGNOSIS — C61 Malignant neoplasm of prostate: Secondary | ICD-10-CM | POA: Diagnosis not present

## 2023-09-22 DIAGNOSIS — Z510 Encounter for antineoplastic radiation therapy: Secondary | ICD-10-CM | POA: Diagnosis not present

## 2023-09-22 LAB — RAD ONC ARIA SESSION SUMMARY
Course Elapsed Days: 36
Plan Fractions Treated to Date: 25
Plan Prescribed Dose Per Fraction: 2 Gy
Plan Total Fractions Prescribed: 35
Plan Total Prescribed Dose: 70 Gy
Reference Point Dosage Given to Date: 50 Gy
Reference Point Session Dosage Given: 2 Gy
Session Number: 25

## 2023-09-22 MED FILL — Trazodone HCl Tab 100 MG: ORAL | 90 days supply | Qty: 180 | Fill #0 | Status: AC

## 2023-09-22 MED FILL — Methocarbamol Tab 500 MG: ORAL | 10 days supply | Qty: 30 | Fill #0 | Status: AC

## 2023-09-22 MED FILL — Glipizide Tab ER 24HR 10 MG: ORAL | 90 days supply | Qty: 90 | Fill #0 | Status: AC

## 2023-09-22 NOTE — Telephone Encounter (Signed)
Requested medications are due for refill today.  yes  Requested medications are on the active medications list.  yes  Last refill. 07/29/2023 #30 0   Future visit scheduled.   no  Notes to clinic.  Refill not delegated.    Requested Prescriptions  Pending Prescriptions Disp Refills   methocarbamol (ROBAXIN) 500 MG tablet 30 tablet 0    Sig: Take 1 tablet (500 mg total) by mouth every 8 (eight) hours as needed for muscle spasms.     Not Delegated - Analgesics:  Muscle Relaxants Failed - 09/20/2023 10:47 AM      Failed - This refill cannot be delegated      Passed - Valid encounter within last 6 months    Recent Outpatient Visits           1 month ago Cervical radiculitis   [place] [facility] [place], [place] W, NP   2 months ago Type 2 diabetes mellitus with hyperglycemia, without long-term current use of insulin [facility])   [place] [facility] [place], Salvadore Oxford, NP   5 months ago Encounter for general adult medical examination with abnormal findings   [place] [facility] [place], Salvadore Oxford, NP   10 months ago Chronic nausea   [place] [facility] [place], Salvadore Oxford, NP   11 months ago Type 2 diabetes mellitus with hyperglycemia, without long-term current use of insulin [facility])   [place] [facility] [place], Salvadore Oxford, NP       Future Appointments             In 5 days [place], Salvadore Oxford, NP  [facility], [place]   In 3 months Vanna Scotland, MD [facility] [place]            Signed Prescriptions Disp Refills   glipiZIDE (GLUCOTROL XL) 10 MG 24 hr tablet 90 tablet 1    Sig: Take 1 tablet (10 mg total) by mouth daily with breakfast.     Endocrinology:  Diabetes - Sulfonylureas Failed - 09/20/2023 10:47 AM      Failed - HBA1C is between 0 and 7.9 and within 180 days    Hemoglobin A1C  Date Value Ref Range Status  06/28/2023 8.6 (A) 4.0 - 5.6 % Final    Hgb A1c MFr Bld  Date Value Ref Range Status  03/28/2023 6.9 (H) <5.7 % of total Hgb Final    Comment:    For someone without known diabetes, a hemoglobin A1c value of 6.5% or greater indicates that they may have  diabetes and this should be confirmed with a follow-up  test. . For someone with known diabetes, a value <7% indicates  that their diabetes is well controlled and a value  greater than or equal to 7% indicates suboptimal  control. A1c targets should be individualized based on  duration of diabetes, age, comorbid conditions, and  other considerations. . Currently, no consensus exists regarding use of hemoglobin A1c for diagnosis of diabetes for children. .          Passed - Cr in normal range and within 360 days    Creat  Date Value Ref Range Status  06/28/2023 0.86 0.70 - 1.35 mg/dL Final   Creatinine,U  Date Value Ref Range Status  11/14/2018 183.0 mg/dL Final   Creatinine, Urine  Date Value Ref Range Status  06/28/2023 91 20 - 320 mg/dL Final  Passed - Valid encounter within last 6 months    Recent Outpatient Visits           1 month ago Cervical radiculitis   [place] [facility] [place], [place], NP   2 months ago Type 2 diabetes mellitus with hyperglycemia, without long-term current use of insulin [facility])   [place] [facility] [place], Salvadore Oxford, NP   5 months ago Encounter for general adult medical examination with abnormal findings   [place] [facility] [place], Salvadore Oxford, NP   10 months ago Chronic nausea   [place] [facility] [place], [place] W, NP   11 months ago Type 2 diabetes mellitus with hyperglycemia, without long-term current use of insulin [facility])   [place] [facility] [place], Salvadore Oxford, NP       Future Appointments             In 5 days [place], Salvadore Oxford, NP [place] [facility], [place]   In 3 months Vanna Scotland, MD [facility] [place]             traZODone (DESYREL) 100 MG tablet 180 tablet 1    Sig: Take 2 tablets (200 mg total) by mouth at bedtime.     Psychiatry: Antidepressants - Serotonin Modulator Passed - 09/20/2023 10:47 AM      Passed - Completed PHQ-2 or PHQ-9 in the last 360 days      Passed - Valid encounter within last 6 months    Recent Outpatient Visits           1 month ago Cervical radiculitis   [place] [facility] [place], [place], NP   2 months ago Type 2 diabetes mellitus with hyperglycemia, without long-term current use of insulin [facility])   [place] [facility] [place], Salvadore Oxford, NP   5 months ago Encounter for general adult medical examination with abnormal findings   [facility] [place], Salvadore Oxford, NP   10 months ago Chronic nausea   [place] [facility] [place], [place], NP   11 months ago Type 2 diabetes mellitus with hyperglycemia, without long-term current use of insulin [facility])   [place] [facility] [place], Salvadore Oxford, NP       Future Appointments             In 5 days [place], Salvadore Oxford, NP [place] [facility], [place]   In 3 months Vanna Scotland, MD [facility]

## 2023-09-22 NOTE — Telephone Encounter (Signed)
Requested Prescriptions  Pending Prescriptions Disp Refills   glipiZIDE (GLUCOTROL XL) 10 MG 24 hr tablet 90 tablet 1    Sig: Take 1 tablet (10 mg total) by mouth daily with breakfast.     Endocrinology:  Diabetes - Sulfonylureas Failed - 09/20/2023 10:47 AM      Failed - HBA1C is between 0 and 7.9 and within 180 days    Hemoglobin A1C  Date Value Ref Range Status  06/28/2023 8.6 (A) 4.0 - 5.6 % Final   Hgb A1c MFr Bld  Date Value Ref Range Status  03/28/2023 6.9 (H) <5.7 % of total Hgb Final    Comment:    For someone without known diabetes, a hemoglobin A1c value of 6.5% or greater indicates that they may have  diabetes and this should be confirmed with a follow-up  test. . For someone with known diabetes, a value <7% indicates  that their diabetes is well controlled and a value  greater than or equal to 7% indicates suboptimal  control. A1c targets should be individualized based on  duration of diabetes, age, comorbid conditions, and  other considerations. . Currently, no consensus exists regarding use of hemoglobin A1c for diagnosis of diabetes for children. .          Passed - Cr in normal range and within 360 days    Creat  Date Value Ref Range Status  06/28/2023 0.86 0.70 - 1.35 mg/dL Final   Creatinine,U  Date Value Ref Range Status  11/14/2018 183.0 mg/dL Final   Creatinine, Urine  Date Value Ref Range Status  06/28/2023 91 20 - 320 mg/dL Final         Passed - Valid encounter within last 6 months    Recent Outpatient Visits           1 month ago Cervical radiculitis   [place] [facility] [place], [place], NP   2 months ago Type 2 diabetes mellitus with hyperglycemia, without long-term current use of insulin [facility])   [place] [place], Salvadore Oxford, NP   5 months ago Encounter for general adult medical examination with abnormal findings   [place] [place], Salvadore Oxford, NP   10 months  ago Chronic nausea   [facility] [place], [place], NP   11 months ago Type 2 diabetes mellitus with hyperglycemia, without long-term current use of insulin [facility])   [place] [facility] [place], Salvadore Oxford, NP       Future Appointments             In 5 days [place], Salvadore Oxford, NP [place] [facility] [place], [facility]   In 3 months Vanna Scotland, MD [facility] [place]             methocarbamol (ROBAXIN) 500 MG tablet 30 tablet 0    Sig: Take 1 tablet (500 mg total) by mouth every 8 (eight) hours as needed for muscle spasms.     Not Delegated - Analgesics:  Muscle Relaxants Failed - 09/20/2023 10:47 AM      Failed - This refill cannot be delegated      Passed - Valid encounter within last 6 months    Recent Outpatient Visits           1 month ago Cervical radiculitis   [place] [facility] [place], Salvadore Oxford, NP   2 months  ago Type 2 diabetes mellitus with hyperglycemia, without long-term current use of insulin [facility])   [place] [facility] [place], Salvadore Oxford, NP   5 months ago Encounter for general adult medical examination with abnormal findings   [place] [facility] [place], Salvadore Oxford, NP   10 months ago Chronic nausea   [place] [facility] [place], [place] W, NP   11 months ago Type 2 diabetes mellitus with hyperglycemia, without long-term current use of insulin [facility])   [place] [facility] [place], Salvadore Oxford, NP       Future Appointments             In 5 days [place], Salvadore Oxford, NP [place] [facility], [place]   In 3 months Vanna Scotland, MD [facility] [place]             traZODone (DESYREL) 100 MG tablet 180 tablet 1    Sig: Take 2 tablets (200 mg total) by mouth at bedtime.     Psychiatry: Antidepressants - Serotonin Modulator Passed - 09/20/2023 10:47 AM      Passed - Completed PHQ-2 or  PHQ-9 in the last 360 days      Passed - Valid encounter within last 6 months    Recent Outpatient Visits           1 month ago Cervical radiculitis   [place] [facility] [place], [place], NP   2 months ago Type 2 diabetes mellitus with hyperglycemia, without long-term current use of insulin [facility])   [place] [facility] [place], Salvadore Oxford, NP   5 months ago Encounter for general adult medical examination with abnormal findings   [place] [facility], Salvadore Oxford, NP   10 months ago Chronic nausea   [place] [facility] [place], [place], NP   11 months ago Type 2 diabetes mellitus with hyperglycemia, without long-term current use of insulin [facility])   [place] [facility] [place], Salvadore Oxford, NP       Future Appointments             In 5 days [place], Salvadore Oxford, NP [facility], [place]   In 3 months Vanna Scotland, MD [facility]

## 2023-09-23 ENCOUNTER — Other Ambulatory Visit: Payer: Self-pay

## 2023-09-23 ENCOUNTER — Ambulatory Visit
Admission: RE | Admit: 2023-09-23 | Discharge: 2023-09-23 | Disposition: A | Discharge status: Home / Self Care | Payer: 59 | Source: Ambulatory Visit | Attending: Radiation Oncology | Admitting: Radiation Oncology

## 2023-09-23 DIAGNOSIS — Z191 Hormone sensitive malignancy status: Secondary | ICD-10-CM | POA: Diagnosis not present

## 2023-09-23 DIAGNOSIS — Z510 Encounter for antineoplastic radiation therapy: Secondary | ICD-10-CM | POA: Diagnosis not present

## 2023-09-23 DIAGNOSIS — C61 Malignant neoplasm of prostate: Secondary | ICD-10-CM | POA: Diagnosis not present

## 2023-09-23 LAB — RAD ONC ARIA SESSION SUMMARY
Course Elapsed Days: 37
Plan Fractions Treated to Date: 26
Plan Prescribed Dose Per Fraction: 2 Gy
Plan Total Fractions Prescribed: 35
Plan Total Prescribed Dose: 70 Gy
Reference Point Dosage Given to Date: 52 Gy
Reference Point Session Dosage Given: 2 Gy
Session Number: 26

## 2023-09-26 ENCOUNTER — Ambulatory Visit
Admission: RE | Admit: 2023-09-26 | Discharge: 2023-09-26 | Disposition: A | Discharge status: Home / Self Care | Payer: 59 | Source: Ambulatory Visit | Attending: Radiation Oncology | Admitting: Radiation Oncology

## 2023-09-26 ENCOUNTER — Other Ambulatory Visit: Payer: Self-pay

## 2023-09-26 DIAGNOSIS — Z191 Hormone sensitive malignancy status: Secondary | ICD-10-CM | POA: Diagnosis not present

## 2023-09-26 DIAGNOSIS — Z510 Encounter for antineoplastic radiation therapy: Secondary | ICD-10-CM | POA: Diagnosis not present

## 2023-09-26 DIAGNOSIS — C61 Malignant neoplasm of prostate: Secondary | ICD-10-CM | POA: Diagnosis not present

## 2023-09-26 LAB — RAD ONC ARIA SESSION SUMMARY
Course Elapsed Days: 40
Plan Fractions Treated to Date: 27
Plan Prescribed Dose Per Fraction: 2 Gy
Plan Total Fractions Prescribed: 35
Plan Total Prescribed Dose: 70 Gy
Reference Point Dosage Given to Date: 54 Gy
Reference Point Session Dosage Given: 2 Gy
Session Number: 27

## 2023-09-27 ENCOUNTER — Other Ambulatory Visit: Payer: Self-pay

## 2023-09-27 ENCOUNTER — Ambulatory Visit
Admission: RE | Admit: 2023-09-27 | Discharge: 2023-09-27 | Disposition: A | Discharge status: Home / Self Care | Payer: 59 | Source: Ambulatory Visit | Attending: Radiation Oncology | Admitting: Radiation Oncology

## 2023-09-27 ENCOUNTER — Ambulatory Visit (INDEPENDENT_AMBULATORY_CARE_PROVIDER_SITE_OTHER): Payer: 59 | Admitting: Internal Medicine

## 2023-09-27 ENCOUNTER — Encounter
Admission: RE | Admit: 2023-09-27 | Discharge: 2023-09-27 | Disposition: A | Discharge status: Home / Self Care | Payer: 59 | Source: Ambulatory Visit | Attending: Neurosurgery | Admitting: Neurosurgery

## 2023-09-27 ENCOUNTER — Encounter: Payer: Self-pay | Admitting: Internal Medicine

## 2023-09-27 VITALS — BP 133/86 | HR 91 | Temp 98.4°F | Resp 12 | Ht 71.0 in | Wt 217.0 lb

## 2023-09-27 VITALS — BP 130/78 | Ht 71.0 in | Wt 217.4 lb

## 2023-09-27 DIAGNOSIS — G43C1 Periodic headache syndromes in child or adult, intractable: Secondary | ICD-10-CM

## 2023-09-27 DIAGNOSIS — E1165 Type 2 diabetes mellitus with hyperglycemia: Secondary | ICD-10-CM | POA: Insufficient documentation

## 2023-09-27 DIAGNOSIS — Z510 Encounter for antineoplastic radiation therapy: Secondary | ICD-10-CM | POA: Diagnosis not present

## 2023-09-27 DIAGNOSIS — F419 Anxiety disorder, unspecified: Secondary | ICD-10-CM

## 2023-09-27 DIAGNOSIS — E66811 Obesity, class 1: Secondary | ICD-10-CM | POA: Diagnosis not present

## 2023-09-27 DIAGNOSIS — Z191 Hormone sensitive malignancy status: Secondary | ICD-10-CM | POA: Diagnosis not present

## 2023-09-27 DIAGNOSIS — K219 Gastro-esophageal reflux disease without esophagitis: Secondary | ICD-10-CM

## 2023-09-27 DIAGNOSIS — C61 Malignant neoplasm of prostate: Secondary | ICD-10-CM | POA: Diagnosis not present

## 2023-09-27 DIAGNOSIS — Z8546 Personal history of malignant neoplasm of prostate: Secondary | ICD-10-CM

## 2023-09-27 DIAGNOSIS — Z01810 Encounter for preprocedural cardiovascular examination: Secondary | ICD-10-CM

## 2023-09-27 DIAGNOSIS — E785 Hyperlipidemia, unspecified: Secondary | ICD-10-CM

## 2023-09-27 DIAGNOSIS — E1169 Type 2 diabetes mellitus with other specified complication: Secondary | ICD-10-CM | POA: Diagnosis not present

## 2023-09-27 DIAGNOSIS — G4701 Insomnia due to medical condition: Secondary | ICD-10-CM | POA: Diagnosis not present

## 2023-09-27 DIAGNOSIS — E119 Type 2 diabetes mellitus without complications: Secondary | ICD-10-CM

## 2023-09-27 DIAGNOSIS — Z6830 Body mass index (BMI) 30.0-30.9, adult: Secondary | ICD-10-CM

## 2023-09-27 DIAGNOSIS — Z01812 Encounter for preprocedural laboratory examination: Secondary | ICD-10-CM

## 2023-09-27 DIAGNOSIS — F32A Depression, unspecified: Secondary | ICD-10-CM

## 2023-09-27 DIAGNOSIS — G4733 Obstructive sleep apnea (adult) (pediatric): Secondary | ICD-10-CM

## 2023-09-27 DIAGNOSIS — I10 Essential (primary) hypertension: Secondary | ICD-10-CM

## 2023-09-27 DIAGNOSIS — Z01818 Encounter for other preprocedural examination: Secondary | ICD-10-CM | POA: Diagnosis present

## 2023-09-27 DIAGNOSIS — E6609 Other obesity due to excess calories: Secondary | ICD-10-CM

## 2023-09-27 DIAGNOSIS — M5412 Radiculopathy, cervical region: Secondary | ICD-10-CM

## 2023-09-27 DIAGNOSIS — M150 Primary generalized (osteo)arthritis: Secondary | ICD-10-CM

## 2023-09-27 HISTORY — DX: Radiculopathy, cervical region: M54.12

## 2023-09-27 HISTORY — DX: Personal history of other diseases of the digestive system: Z87.19

## 2023-09-27 HISTORY — DX: Type 2 diabetes mellitus with other specified complication: E11.69

## 2023-09-27 HISTORY — DX: Unspecified osteoarthritis, unspecified site: M19.90

## 2023-09-27 HISTORY — DX: Type 2 diabetes mellitus with other specified complication: E78.5

## 2023-09-27 LAB — RAD ONC ARIA SESSION SUMMARY
Course Elapsed Days: 41
Plan Fractions Treated to Date: 28
Plan Prescribed Dose Per Fraction: 2 Gy
Plan Total Fractions Prescribed: 35
Plan Total Prescribed Dose: 70 Gy
Reference Point Dosage Given to Date: 56 Gy
Reference Point Session Dosage Given: 2 Gy
Session Number: 28

## 2023-09-27 LAB — BASIC METABOLIC PANEL
Anion gap: 11 (ref 5–15)
BUN: 19 mg/dL (ref 6–20)
CO2: 26 mmol/L (ref 22–32)
Calcium: 9.5 mg/dL (ref 8.9–10.3)
Chloride: 99 mmol/L (ref 98–111)
Creatinine, Ser: 0.97 mg/dL (ref 0.61–1.24)
GFR, Estimated: >60 mL/min (ref >60)
Glucose, Bld: 255 mg/dL — ABNORMAL HIGH (ref 70–99)
Potassium: 4.3 mmol/L (ref 3.5–5.1)
Sodium: 136 mmol/L (ref 135–145)

## 2023-09-27 LAB — SURGICAL PCR SCREEN
MRSA, PCR: NEGATIVE
Staphylococcus aureus: NEGATIVE

## 2023-09-27 LAB — CBC
HCT: 39.5 % (ref 39.0–52.0)
Hemoglobin: 13.3 g/dL (ref 13.0–17.0)
MCH: 26.4 pg (ref 26.0–34.0)
MCHC: 33.7 g/dL (ref 30.0–36.0)
MCV: 78.4 fL — ABNORMAL LOW (ref 80.0–100.0)
Platelets: 290 10*3/uL (ref 150–400)
RBC: 5.04 MIL/uL (ref 4.22–5.81)
RDW: 14.8 % (ref 11.5–15.5)
WBC: 2.6 10*3/uL — ABNORMAL LOW (ref 4.0–10.5)
nRBC: 0 % (ref 0.0–0.2)

## 2023-09-27 LAB — POCT GLYCOSYLATED HEMOGLOBIN (HGB A1C): Hemoglobin A1C: 8.3 % — AB (ref 4.0–5.6)

## 2023-09-27 MED ORDER — TIRZEPATIDE 2.5 MG/0.5ML ~~LOC~~ SOAJ
2.5000 mg | SUBCUTANEOUS | 0 refills | Status: DC
  Filled 2023-09-27 – 2023-09-30 (×2): qty 2, 28d supply, fill #0

## 2023-09-27 NOTE — Assessment & Plan Note (Signed)
Continue tylenol and ibuprofen OTC Encouraged weight loss as this can help reduce joint pain

## 2023-09-27 NOTE — Assessment & Plan Note (Signed)
Encourage weight loss as this can help reduce reflux symptoms Continue pantoprazole 

## 2023-09-27 NOTE — Progress Notes (Signed)
Subjective:    Patient ID: Marc Aguilar, male    DOB: 05/21/1961, 62 y.o.   MRN: [id number]  HPI  Patient presents to clinic today for 45-month follow-up of chronic conditions.  Migraines: These occur rarely.  Triggered by neck pain.  He takes excedrin migraine as needed with some relief of symptoms.  He does not follow with neurology.  OSA: He averages 4 to 5 hours per night with the use of his CPAP.  Sleep study from 12/2016 reviewed.  HLD: His last LDL was not calculated, triglycerides 447, 06/2023.  He denies myalgias on atorvastatin and ezetimibe.  He does not consume a low-fat diet.  HTN: His BP today is 130/78.  He is taking amlodipine as prescribed.  ECG from 04/2023 reviewed.  DM2: His last A1c was 8.6%, 06/2023.  His sugars range 210-250.  He is taking glipizide as prescribed.Marland Kitchen  He checks his feet routinely.  His last eye exam was 06/2023.  Flu 06/2023.  Pneumovax 10/2018.  COVID Pfizer x 3.  Anxiety and depression: Chronic, managed on citalopram and bupropion.  He is not currently seeing a therapist.  He denies SI/HI.  GERD: He is not sure what triggers this.  He denies breakthrough on pantoprazole.  Upper GI from 04/2023 reviewed.  ED: Managed with cialis as needed.  He follows with urology.  Insomnia: He has difficulty falling and staying asleep.  He is taking trazodone as needed with good results.  Sleep study from 12/2016 reviewed.  Cervical radiculitis: MRI cervical spine from 02/2022 reviewed. He is taking ibuprofen and tylenol OTC with minimal relief of symptoms. He plans to have surgery soon. He follows with neurosurgery.  Chronic right shoulder pain: MRI from 02/2022 reviewed. He is taking ibuprofen and tylenol OTC with minimal relief of symptoms.  He follows with orthopedics.  Chronic right hip pain: He is taking ibuprofen and tylenol OTC with minimal relief of symptoms. He is not following with orthopedics for this.   Prostate cancer: He is currently undergoing  radiation. He had a prostatectomy 2 years ago. He follows with oncology and urology.   Review of Systems     Past Medical History:  Diagnosis Date   Anxiety    Arthritis    "knees and hips" (02/28/2018)   Cancer (HCC)    prostate    Chicken pox    Depression    GERD (gastroesophageal reflux disease)    Heart murmur    Hernia, abdominal    High cholesterol    History of gout    Hypertension    Migraine    "probably monthly" (02/28/2018)   Motion sickness    ocean boats, circular motion   Neck pain    OSA on CPAP    Pneumonia    "once" (02/28/2018)   Refusal of blood transfusions as patient is Jehovah's Witness    Seasonal allergies    Type II diabetes mellitus (HCC)     Current Outpatient Medications  Medication Sig Dispense Refill   albuterol (VENTOLIN HFA) 108 (90 Base) MCG/ACT inhaler Inhale 2 puffs into the lungs every 6 (six) hours as needed for wheezing or shortness of breath. (Patient not taking: Reported on 09/21/2023) 1 Inhaler 0   Alcohol Swabs (ALCOHOL PREP) PADS 1 each by Does not apply route 2 (two) times daily. 200 each 3   amLODipine (NORVASC) 10 MG tablet Take 1 tablet (10 mg total) by mouth daily. 90 tablet 0   aspirin 325 MG tablet Take  325 mg by mouth daily.     atorvastatin (LIPITOR) 20 MG tablet Take 1 tablet (20 mg total) by mouth daily. 90 tablet 0   Blood Glucose Monitoring Suppl (BLOOD GLUCOSE MONITOR SYSTEM) w/Device KIT Use as directed to check blood sugar once daily. 1 kit 0   buPROPion (WELLBUTRIN XL) 300 MG 24 hr tablet Take 1 tablet (300 mg total) by mouth daily. 90 tablet 0   citalopram (CELEXA) 40 MG tablet Take 1 tablet (40 mg total) by mouth daily. 90 tablet 0   ezetimibe (ZETIA) 10 MG tablet Take 1 tablet (10 mg total) by mouth daily. 90 tablet 0   glipiZIDE (GLUCOTROL XL) 10 MG 24 hr tablet Take 1 tablet (10 mg total) by mouth daily with breakfast. 90 tablet 1   Glucose Blood (BLOOD GLUCOSE TEST STRIPS) STRP Use to check blood sugar once a  day. 100 strip 1   Lancet Device MISC Use to check blood sugar once a day.  DX: E11.9 May substitute to any manufacturer covered by patient's insurance. 1 each 0   Lancets (FREESTYLE) lancets Use as directed to check blood sugar once daily. 100 each 1   Lancets MISC 1 each by Does not apply route in the morning and at bedtime. 200 each 2   methocarbamol (ROBAXIN) 500 MG tablet Take 1 tablet (500 mg total) by mouth every 8 (eight) hours as needed for muscle spasms. 30 tablet 0   ondansetron (ZOFRAN-ODT) 4 MG disintegrating tablet Take 1 tablet (4 mg total) by mouth every 8 (eight) hours as needed for nausea or vomiting. 20 tablet 0   pantoprazole (PROTONIX) 40 MG tablet Take 1 tablet (40 mg total) by mouth 2 (two) times daily before a meal. 60 tablet 5   tadalafil (CIALIS) 20 MG tablet Take 1 tablet (20 mg total) by mouth daily as needed for erectile dysfunction. 10 tablet 6   traZODone (DESYREL) 100 MG tablet Take 2 tablets (200 mg total) by mouth at bedtime. 180 tablet 1   No current facility-administered medications for this visit.    Allergies  Allergen Reactions   Tape Rash    Paper tape-blisters    Family History  Problem Relation Age of Onset   Diabetes Mother    Hyperlipidemia Mother    Bladder Cancer Father    Liver cancer Sister    Diabetes Sister    Diabetes Maternal Aunt    Arthritis Maternal Grandmother    Diabetes Maternal Grandmother    Arthritis Maternal Grandfather    Arthritis Paternal Grandmother    Diabetes Brother    Heart disease Brother    Colon cancer Neg Hx     Social History   Socioeconomic History   Marital status: Married    Spouse name: Not on file   Number of children: 2   Years of education: Not on file   Highest education level: Bachelor's degree (e.g., BA, AB, BS)  Occupational History   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  Tobacco Use   Smoking status: Former    Current packs/day: 0.00    Average packs/day: 1 pack/day for 27.0 years (27.0 ttl  pk-yrs)    Types: Cigarettes    Start date: 10/18/1982    Quit date: 10/18/2009    Years since quitting: 13.9   Smokeless tobacco: Former    Types: Chew    Quit date: 10/18/1994  Vaping Use   Vaping status: Never Used  Substance and Sexual Activity   Alcohol use: Yes  Alcohol/week: 21.0 standard drinks of alcohol    Types: 21 Cans of beer per week   Drug use: Never   Sexual activity: Not on file  Other Topics Concern   Not on file  Social History Narrative   Not on file   Social Determinants of Health   Financial Resource Strain: Low Risk  (03/28/2023)   Overall Financial Resource Strain (CARDIA)    Difficulty of Paying Living Expenses: Not hard at all  Food Insecurity: No Food Insecurity (07/07/2023)   Hunger Vital Sign    Worried About Running Out of Food in the Last Year: Never true    Ran Out of Food in the Last Year: Never true  Transportation Needs: No Transportation Needs (03/28/2023)   PRAPARE - Administrator, Civil Service (Medical): No    Lack of Transportation (Non-Medical): No  Physical Activity: Sufficiently Active (03/28/2023)   Exercise Vital Sign    Days of Exercise per Week: 1 day    Minutes of Exercise per Session: 150+ min  Stress: Stress Concern Present (03/28/2023)   [employer] of Occupational Health - Occupational Stress Questionnaire    Feeling of Stress : Very much  Social Connections: Moderately Isolated (03/28/2023)   Social Connection and Isolation Panel [NHANES]    Frequency of Communication with Friends and Family: Once a week    Frequency of Social Gatherings with Friends and Family: Never    Attends Religious Services: More than 4 times per year    Active Member of [employer] or Organizations: No    Attends Engineer, structural: Not on file    Marital Status: Married  Catering manager Violence: Not on file     Constitutional: Patient reports intermittent headaches.  Denies fever, malaise, fatigue, or abrupt weight  changes.  HEENT: Denies eye pain, eye redness, ear pain, ringing in the ears, wax buildup, runny nose, nasal congestion, bloody nose, or sore throat. Respiratory: Denies difficulty breathing, shortness of breath, cough or sputum production.   Cardiovascular: Denies chest pain, chest tightness, palpitations or swelling in the hands or feet.  Gastrointestinal: Denies abdominal pain, bloating, constipation, diarrhea or blood in the stool.  GU: Patient reports erectile dysfunction.  Denies urgency, frequency, pain with urination, burning sensation, blood in urine, odor or discharge. Musculoskeletal: Patient reports chronic neck,  right shoulder pain, right hip pain.  Denies decrease in range of motion, difficulty with gait, muscle pain or joint swelling.  Skin: Denies redness, rashes, lesions or ulcercations.  Neurological: Patient reports insomnia, paresthesias of upper extremities.  Denies dizziness, difficulty with memory, difficulty with speech or problems with balance and coordination.  Psych: Patient has a history of anxiety and depression.  Denies SI/HI.  No other specific complaints in a complete review of systems (except as listed in HPI above).  Objective:   Physical Exam  BP 130/78   Ht 5\' 11"  (1.803 m)   Wt 217 lb 6.4 oz (98.6 kg)   BMI 30.32 kg/m    Wt Readings from Last 3 Encounters:  08/18/23 220 lb (99.8 kg)  08/01/23 220 lb (99.8 kg)  07/29/23 215 lb (97.5 kg)    General: Appears his stated age, obese, in NAD. Skin: Warm, dry and intact. No ulcerations noted. HEENT: Head: normal shape and size; Eyes: sclera white, no icterus, conjunctiva pink, PERRLA and EOMs intact; Cardiovascular: Normal rate and rhythm. S1,S2 noted.  No murmur, rubs or gallops noted. No JVD or BLE edema. No carotid bruits noted. Pulmonary/Chest:  Normal effort and positive vesicular breath sounds. No respiratory distress. No wheezes, rales or ronchi noted.  Abdomen: Soft and nontender. Normal bowel  sounds.  Musculoskeletal: Limping gait. Neurological: Alert and oriented. Cranial nerves II-XII grossly intact. Coordination normal.  Psychiatric: Mood and affect normal. Behavior is normal. Judgment and thought content normal.     BMET    Component Value Date/Time   NA 135 06/28/2023 1525   K 4.6 06/28/2023 1525   CL 98 06/28/2023 1525   CO2 25 06/28/2023 1525   GLUCOSE 406 (H) 06/28/2023 1525   BUN 14 06/28/2023 1525   CREATININE 0.86 06/28/2023 1525   CALCIUM 9.7 06/28/2023 1525   GFRNONAA >60 05/06/2023 2049   GFRAA >60 11/20/2019 0445    Lipid Panel     Component Value Date/Time   CHOL 192 06/28/2023 1525   CHOL 157 10/26/2019 1203   TRIG 447 (H) 06/28/2023 1525   HDL 43 06/28/2023 1525   HDL 48 10/26/2019 1203   CHOLHDL 4.5 06/28/2023 1525   VLDL 60.2 (H) 01/01/2021 1047   LDLCALC  06/28/2023 1525     Comment:     . LDL cholesterol not calculated. Triglyceride levels greater than 400 mg/dL invalidate calculated LDL results. . Reference range: <100 . Desirable range <100 mg/dL for primary prevention;   <70 mg/dL for patients with CHD or diabetic patients  with > or = 2 CHD risk factors. Marland Kitchen LDL-C is now calculated using the Martin-Hopkins  calculation, which is a validated novel method providing  better accuracy than the Friedewald equation in the  estimation of LDL-C.  Horald Pollen et al. Lenox Ahr. 2536;644(03): 2061-2068  (http://education.[facility].com/faq/FAQ164)     CBC    Component Value Date/Time   WBC 2.8 (L) 09/19/2023 0809   WBC 4.3 06/28/2023 1525   RBC 4.97 09/19/2023 0809   HGB 13.4 09/19/2023 0809   HCT 40.0 09/19/2023 0809   PLT 205 09/19/2023 0809   MCV 80.5 09/19/2023 0809   MCH 27.0 09/19/2023 0809   MCHC 33.5 09/19/2023 0809   RDW 14.5 09/19/2023 0809   LYMPHSABS 1.9 05/06/2023 2049   MONOABS 0.5 05/06/2023 2049   EOSABS 0.1 05/06/2023 2049   BASOSABS 0.0 05/06/2023 2049    Hgb A1C Lab Results  Component Value Date    HGBA1C 8.6 (A) 06/28/2023           Assessment & Plan:     RTC in 3 months, follow-up chronic conditions Nicki Reaper, NP

## 2023-09-27 NOTE — Assessment & Plan Note (Signed)
Stable on citalopram and bupropion Support offered

## 2023-09-27 NOTE — Progress Notes (Signed)
Dr Myer Haff gave him a cut off of 8% (A1C) or less to have surgery. A1C result on 09/27/23 (ordered by PCP) is 8.3. Per discussion with Dr Myer Haff today, this is close enough that he will allow him to proceed with surgery on 10/10/23.

## 2023-09-27 NOTE — Patient Instructions (Signed)

## 2023-09-27 NOTE — Assessment & Plan Note (Signed)
Continue trazodone 

## 2023-09-27 NOTE — Assessment & Plan Note (Signed)
Encourage weight loss as this can help reduce sleep apnea symptoms Continue CPAP 

## 2023-09-27 NOTE — Assessment & Plan Note (Signed)
Currently getting radiation treatments Following with urology and oncology

## 2023-09-27 NOTE — Assessment & Plan Note (Signed)
Continue ibuprofen and tylenol He will continue to follow with neurosurgery

## 2023-09-27 NOTE — Progress Notes (Signed)
  Perioperative Services: Pre-Admission/Anesthesia Testing  Abnormal Lab Notification    Date: 09/27/23  Name: Marc Aguilar MRN:   [id number]  Re: Abnormal labs noted during PAT appointment   Provider(s) Notified: Venetia Night, MD Notification mode: Routed and/or faxed via [facility]   ABNORMAL LAB VALUE(S): Lab Results  Component Value Date   GLUCOSE 255 (H) 09/27/2023   Notes: Patient with a T2DM diagnosis. He is currently on both oral (glipizide) and parenteral (tirzepatide) therapies. Patient was seen by his PCP today and Hgb A1c was found to be elevated at 8.3%. In efforts to reduce the risk of developing SSI, or other potential perioperative complications, this communication is being sent in order to determine if patient is deemed to be adequately optimized  for surgery. Medicine has cleared the patient at an overall MODERATE risk.   In light of the aforementioned A1c, his diabetes could pose increased risks for the patient during their perioperative course and subsequent recovery period. With that being said, the benefit of improving glycemic control must be weighed against the overall risks associated with delaying a necessary elective surgical procedure for this patient.   Quentin Mulling, MSN, APRN, FNP-C, CEN [facility]  Perioperative Services Nurse Practitioner Phone: [phone] Fax: [phone] 09/27/23 12:52 PM

## 2023-09-27 NOTE — Assessment & Plan Note (Signed)
PCOT A1C today 8.3% urine microalbumin has been checked within the last year Encouraged low carb diet and exercise for weight loss Continue glipizide to 10 mg XL daily Add mounjaro 2.5 mg weekly Encouraged routine eye exam Encouraged routine foot exam Flu shot UTD

## 2023-09-27 NOTE — Assessment & Plan Note (Signed)
C-Met and lipid profile today Encourage him to consume a low-fat diet Continue atorvastatin and ezetimibe

## 2023-09-27 NOTE — Assessment & Plan Note (Signed)
Controlled on amlodipine Reinforced DASH diet and exercise for weight loss CMET today

## 2023-09-27 NOTE — Assessment & Plan Note (Signed)
Encouraged diet and exercise for weight loss ?

## 2023-09-27 NOTE — Patient Instructions (Signed)
Your procedure is scheduled on: Monday, December 23 Report to the Registration Desk on the 1st floor of the [facility] To find out your arrival time, please call [phone] between 1PM - 3PM on: Friday, December 20 If your arrival time is 6:00 am, do not arrive before that time as the [facility] entrance doors do not open until 6:00 am.  REMEMBER: Instructions that are not followed completely may result in serious medical risk, up to and including death; or upon the discretion of your surgeon and anesthesiologist your surgery may need to be rescheduled.  Do not eat food after midnight the night before surgery.  No gum chewing or hard candies.  You may however, drink water up to 2 hours before you are scheduled to arrive for your surgery. Do not drink anything within 2 hours of your scheduled arrival time.  One week prior to surgery: starting December 16 Stop Anti-inflammatories (NSAIDS) such as Advil, Aleve, Ibuprofen, Motrin, Naproxen, Naprosyn and Aspirin based products such as Excedrin, Goody's Powder, BC Powder. Stop ANY OVER THE COUNTER supplements until after surgery.  You may however, continue to take Tylenol if needed for pain up until the day of surgery.  Aspirin 325 mg - hold for 7 days before surgery. Last day to take aspirin is Sunday, December 15. Do NOT resume until 14 days AFTER surgery.  Tadalafil (Cialis) - do NOT take at least 2 days before surgery.  Continue taking all of your other prescription medications up until the day of surgery.  ON THE DAY OF SURGERY ONLY TAKE THESE MEDICATIONS WITH SIPS OF WATER:  Amlodipine Buproprion Citalopram Ezetimibe Pantoprazole  No Alcohol for 24 hours before or after surgery.  No Smoking including e-cigarettes for 24 hours before surgery.  No chewable tobacco products for at least 6 hours before surgery.  No nicotine patches on the day of surgery.  Do not use any "recreational" drugs for at least a week (preferably  2 weeks) before your surgery.  Please be advised that the combination of cocaine and anesthesia may have negative outcomes, up to and including death. If you test positive for cocaine, your surgery will be cancelled.  On the morning of surgery brush your teeth with toothpaste and water, you may rinse your mouth with mouthwash if you wish. Do not swallow any toothpaste or mouthwash.  Use CHG Soap as directed on instruction sheet.  Do not wear jewelry, make-up, hairpins, clips or nail polish.  For welded (permanent) jewelry: bracelets, anklets, waist bands, etc.  Please have this removed prior to surgery.  If it is not removed, there is a chance that hospital personnel will need to cut it off on the day of surgery.  Do not wear lotions, powders, or perfumes.   Do not shave body hair from the neck down 48 hours before surgery.  Contact lenses, hearing aids and dentures may not be worn into surgery.  Do not bring valuables to the hospital. [facility] is not responsible for any missing/lost belongings or valuables.   Bring your C-PAP to the hospital in case you may have to spend the night.   Notify your doctor if there is any change in your medical condition (cold, fever, infection).  Wear comfortable clothing (specific to your surgery type) to the hospital.  After surgery, you can help prevent lung complications by doing breathing exercises.  Take deep breaths and cough every 1-2 hours.   If you are being admitted to the hospital overnight, leave  your suitcase in the car. After surgery it may be brought to your room.  In case of increased patient census, it may be necessary for you, the patient, to continue your postoperative care in the Same Day Surgery department.  If you are being discharged the day of surgery, you will not be allowed to drive home. You will need a responsible individual to drive you home and stay with you for 24 hours after surgery.   If you are taking public  transportation, you will need to have a responsible individual with you.  Please call the Pre-admissions Testing Dept. at [phone] if you have any questions about these instructions.  Surgery Visitation Policy:  Patients having surgery or a procedure may have two visitors.  Children under the age of 67 must have an adult with them who is not the patient.  Inpatient Visitation:    Visiting hours are 7 a.m. to 8 p.m. Up to four visitors are allowed at one time in a patient room. The visitors may rotate out with other people during the day.  One visitor age 54 or older may stay with the patient overnight and must be in the room by 8 p.m.    Pre-operative 5 CHG Bath Instructions   You can play a key role in reducing the risk of infection after surgery. Your skin needs to be as free of germs as possible. You can reduce the number of germs on your skin by washing with CHG (chlorhexidine gluconate) soap before surgery. CHG is an antiseptic soap that kills germs and continues to kill germs even after washing.   DO NOT use if you have an allergy to chlorhexidine/CHG or antibacterial soaps. If your skin becomes reddened or irritated, stop using the CHG and notify one of our RNs at [phone].   Please shower with the CHG soap starting 4 days before surgery using the following schedule:     Please keep in mind the following:  DO NOT shave, including legs and underarms, starting the day of your first shower.   You may shave your face at any point before/day of surgery.  Place clean sheets on your bed the day you start using CHG soap. Use a clean washcloth (not used since being washed) for each shower. DO NOT sleep with pets once you start using the CHG.   CHG Shower Instructions:  If you choose to wash your hair and private area, wash first with your normal shampoo/soap.  After you use shampoo/soap, rinse your hair and body thoroughly to remove shampoo/soap residue.  Turn the water  OFF and apply about 3 tablespoons (45 ml) of CHG soap to a CLEAN washcloth.  Apply CHG soap ONLY FROM YOUR NECK DOWN TO YOUR TOES (washing for 3-5 minutes)  DO NOT use CHG soap on face, private areas, open wounds, or sores.  Pay special attention to the area where your surgery is being performed.  If you are having back surgery, having someone wash your back for you may be helpful. Wait 2 minutes after CHG soap is applied, then you may rinse off the CHG soap.  Pat dry with a clean towel  Put on clean clothes/pajamas   If you choose to wear lotion, please use ONLY the CHG-compatible lotions on the back of this paper.     Additional instructions for the day of surgery: DO NOT APPLY any lotions, deodorants, cologne, or perfumes.   Put on clean/comfortable clothes.  Brush your teeth.  Ask  your nurse before applying any prescription medications to the skin.      CHG Compatible Lotions   Aveeno Moisturizing lotion  Cetaphil Moisturizing Cream  Cetaphil Moisturizing Lotion  Clairol Herbal Essence Moisturizing Lotion, Dry Skin  Clairol Herbal Essence Moisturizing Lotion, Extra Dry Skin  Clairol Herbal Essence Moisturizing Lotion, Normal Skin  Curel Age Defying Therapeutic Moisturizing Lotion with Alpha Hydroxy  Curel Extreme Care Body Lotion  Curel Soothing Hands Moisturizing Hand Lotion  Curel Therapeutic Moisturizing Cream, Fragrance-Free  Curel Therapeutic Moisturizing Lotion, Fragrance-Free  Curel Therapeutic Moisturizing Lotion, Original Formula  Eucerin Daily Replenishing Lotion  Eucerin Dry Skin Therapy Plus Alpha Hydroxy Crme  Eucerin Dry Skin Therapy Plus Alpha Hydroxy Lotion  Eucerin Original Crme  Eucerin Original Lotion  Eucerin Plus Crme Eucerin Plus Lotion  Eucerin TriLipid Replenishing Lotion  Keri Anti-Bacterial Hand Lotion  Keri Deep Conditioning Original Lotion Dry Skin Formula Softly Scented  Keri Deep Conditioning Original Lotion, Fragrance Free Sensitive  Skin Formula  Keri Lotion Fast Absorbing Fragrance Free Sensitive Skin Formula  Keri Lotion Fast Absorbing Softly Scented Dry Skin Formula  Keri Original Lotion  Keri Skin Renewal Lotion Keri Silky Smooth Lotion  Keri Silky Smooth Sensitive Skin Lotion  Nivea Body Creamy Conditioning Oil  Nivea Body Extra Enriched Teacher, adult education Moisturizing Lotion Nivea Crme  Nivea Skin Firming Lotion  NutraDerm 30 Skin Lotion  NutraDerm Skin Lotion  NutraDerm Therapeutic Skin Cream  NutraDerm Therapeutic Skin Lotion  ProShield Protective Hand Cream  Provon moisturizing lotion

## 2023-09-27 NOTE — Assessment & Plan Note (Signed)
Continue excedrin migraine as needed

## 2023-09-28 ENCOUNTER — Other Ambulatory Visit: Payer: Self-pay

## 2023-09-28 ENCOUNTER — Telehealth: Payer: Self-pay

## 2023-09-28 ENCOUNTER — Ambulatory Visit
Admission: RE | Admit: 2023-09-28 | Discharge: 2023-09-28 | Disposition: A | Discharge status: Home / Self Care | Payer: 59 | Source: Ambulatory Visit | Attending: Radiation Oncology | Admitting: Radiation Oncology

## 2023-09-28 DIAGNOSIS — Z191 Hormone sensitive malignancy status: Secondary | ICD-10-CM | POA: Diagnosis not present

## 2023-09-28 DIAGNOSIS — C61 Malignant neoplasm of prostate: Secondary | ICD-10-CM | POA: Diagnosis not present

## 2023-09-28 DIAGNOSIS — F331 Major depressive disorder, recurrent, moderate: Secondary | ICD-10-CM | POA: Diagnosis not present

## 2023-09-28 DIAGNOSIS — Z510 Encounter for antineoplastic radiation therapy: Secondary | ICD-10-CM | POA: Diagnosis not present

## 2023-09-28 LAB — RAD ONC ARIA SESSION SUMMARY
Course Elapsed Days: 42
Plan Fractions Treated to Date: 29
Plan Prescribed Dose Per Fraction: 2 Gy
Plan Total Fractions Prescribed: 35
Plan Total Prescribed Dose: 70 Gy
Reference Point Dosage Given to Date: 58 Gy
Reference Point Session Dosage Given: 2 Gy
Session Number: 29

## 2023-09-28 LAB — LIPID PANEL
Cholesterol: 216 mg/dL — ABNORMAL HIGH (ref <200)
HDL: 56 mg/dL (ref >=40)
LDL Cholesterol (Calc): 125 mg/dL — ABNORMAL HIGH
Non-HDL Cholesterol (Calc): 160 mg/dL — ABNORMAL HIGH (ref <130)
Total CHOL/HDL Ratio: 3.9 (calc) (ref <5.0)
Triglycerides: 209 mg/dL — ABNORMAL HIGH (ref <150)

## 2023-09-28 NOTE — Telephone Encounter (Signed)
Demetra Shiner (Key: [id number]) PA Case ID #: [id number] Rx #: [id number] Need Help? Call us at [phone] Status sent iconSent to Plan today Drug Mounjaro 2.5MG /0.5ML auto-injectors ePA cloud logo Form [employer] ePA Form 2017 NCPDP Original Claim Info 75

## 2023-09-28 NOTE — Telephone Encounter (Signed)
Marc Aguilar (Key: [id number]) PA Case ID #: [id number] Rx #: [id number] Need Help? Call us at [phone] Outcome Approved today by Med[employer] 2017 The request has been approved. The authorization is effective from 09/27/2023 to 09/27/2024, as long as the member is enrolled in their current health plan. This has been approved for a max daily dosage of 0.071. A written notification letter will follow with additional details. Authorization Expiration Date: 09/26/2024 Drug Mounjaro 2.5MG /0.5ML auto-injectors ePA cloud logo Form [employer] ePA Form 2017 NCPDP Original Claim Info 75

## 2023-09-29 ENCOUNTER — Other Ambulatory Visit: Payer: Self-pay

## 2023-09-29 ENCOUNTER — Ambulatory Visit
Admission: RE | Admit: 2023-09-29 | Discharge: 2023-09-29 | Disposition: A | Discharge status: Home / Self Care | Payer: 59 | Source: Ambulatory Visit | Attending: Radiation Oncology | Admitting: Radiation Oncology

## 2023-09-29 DIAGNOSIS — Z191 Hormone sensitive malignancy status: Secondary | ICD-10-CM | POA: Diagnosis not present

## 2023-09-29 DIAGNOSIS — Z510 Encounter for antineoplastic radiation therapy: Secondary | ICD-10-CM | POA: Diagnosis not present

## 2023-09-29 DIAGNOSIS — C61 Malignant neoplasm of prostate: Secondary | ICD-10-CM | POA: Diagnosis not present

## 2023-09-29 LAB — RAD ONC ARIA SESSION SUMMARY
Course Elapsed Days: 43
Plan Fractions Treated to Date: 30
Plan Prescribed Dose Per Fraction: 2 Gy
Plan Total Fractions Prescribed: 35
Plan Total Prescribed Dose: 70 Gy
Reference Point Dosage Given to Date: 60 Gy
Reference Point Session Dosage Given: 2 Gy
Session Number: 30

## 2023-09-30 ENCOUNTER — Ambulatory Visit
Admission: RE | Admit: 2023-09-30 | Discharge: 2023-09-30 | Disposition: A | Discharge status: Home / Self Care | Payer: 59 | Source: Ambulatory Visit | Attending: Radiation Oncology | Admitting: Radiation Oncology

## 2023-09-30 ENCOUNTER — Other Ambulatory Visit: Payer: Self-pay

## 2023-09-30 DIAGNOSIS — C61 Malignant neoplasm of prostate: Secondary | ICD-10-CM | POA: Diagnosis not present

## 2023-09-30 DIAGNOSIS — Z191 Hormone sensitive malignancy status: Secondary | ICD-10-CM | POA: Diagnosis not present

## 2023-09-30 DIAGNOSIS — Z510 Encounter for antineoplastic radiation therapy: Secondary | ICD-10-CM | POA: Diagnosis not present

## 2023-09-30 LAB — RAD ONC ARIA SESSION SUMMARY
Course Elapsed Days: 44
Plan Fractions Treated to Date: 31
Plan Prescribed Dose Per Fraction: 2 Gy
Plan Total Fractions Prescribed: 35
Plan Total Prescribed Dose: 70 Gy
Reference Point Dosage Given to Date: 62 Gy
Reference Point Session Dosage Given: 2 Gy
Session Number: 31

## 2023-10-03 ENCOUNTER — Inpatient Hospital Stay: Payer: 59

## 2023-10-03 ENCOUNTER — Other Ambulatory Visit: Payer: Self-pay

## 2023-10-03 ENCOUNTER — Ambulatory Visit
Admission: RE | Admit: 2023-10-03 | Discharge: 2023-10-03 | Disposition: A | Discharge status: Home / Self Care | Payer: 59 | Source: Ambulatory Visit | Attending: Radiation Oncology | Admitting: Radiation Oncology

## 2023-10-03 DIAGNOSIS — Z191 Hormone sensitive malignancy status: Secondary | ICD-10-CM | POA: Diagnosis not present

## 2023-10-03 DIAGNOSIS — Z510 Encounter for antineoplastic radiation therapy: Secondary | ICD-10-CM | POA: Diagnosis not present

## 2023-10-03 DIAGNOSIS — C61 Malignant neoplasm of prostate: Secondary | ICD-10-CM | POA: Diagnosis not present

## 2023-10-03 LAB — RAD ONC ARIA SESSION SUMMARY
Course Elapsed Days: 47
Plan Fractions Treated to Date: 32
Plan Prescribed Dose Per Fraction: 2 Gy
Plan Total Fractions Prescribed: 35
Plan Total Prescribed Dose: 70 Gy
Reference Point Dosage Given to Date: 64 Gy
Reference Point Session Dosage Given: 2 Gy
Session Number: 32

## 2023-10-03 LAB — CBC (CANCER CENTER ONLY)
HCT: 34.9 % — ABNORMAL LOW (ref 39.0–52.0)
Hemoglobin: 11.7 g/dL — ABNORMAL LOW (ref 13.0–17.0)
MCH: 26.7 pg (ref 26.0–34.0)
MCHC: 33.5 g/dL (ref 30.0–36.0)
MCV: 79.7 fL — ABNORMAL LOW (ref 80.0–100.0)
Platelet Count: 226 10*3/uL (ref 150–400)
RBC: 4.38 MIL/uL (ref 4.22–5.81)
RDW: 14.5 % (ref 11.5–15.5)
WBC Count: 2.9 10*3/uL — ABNORMAL LOW (ref 4.0–10.5)
nRBC: 0 % (ref 0.0–0.2)

## 2023-10-04 ENCOUNTER — Ambulatory Visit
Admission: RE | Admit: 2023-10-04 | Discharge: 2023-10-04 | Disposition: A | Discharge status: Home / Self Care | Payer: 59 | Source: Ambulatory Visit | Attending: Radiation Oncology | Admitting: Radiation Oncology

## 2023-10-04 ENCOUNTER — Other Ambulatory Visit: Payer: Self-pay

## 2023-10-04 DIAGNOSIS — C61 Malignant neoplasm of prostate: Secondary | ICD-10-CM | POA: Diagnosis not present

## 2023-10-04 DIAGNOSIS — Z191 Hormone sensitive malignancy status: Secondary | ICD-10-CM | POA: Diagnosis not present

## 2023-10-04 DIAGNOSIS — Z510 Encounter for antineoplastic radiation therapy: Secondary | ICD-10-CM | POA: Diagnosis not present

## 2023-10-04 LAB — RAD ONC ARIA SESSION SUMMARY
Course Elapsed Days: 48
Plan Fractions Treated to Date: 33
Plan Prescribed Dose Per Fraction: 2 Gy
Plan Total Fractions Prescribed: 35
Plan Total Prescribed Dose: 70 Gy
Reference Point Dosage Given to Date: 66 Gy
Reference Point Session Dosage Given: 2 Gy
Session Number: 33

## 2023-10-05 ENCOUNTER — Other Ambulatory Visit: Payer: Self-pay

## 2023-10-05 ENCOUNTER — Ambulatory Visit: Payer: 59

## 2023-10-05 ENCOUNTER — Ambulatory Visit
Admission: RE | Admit: 2023-10-05 | Discharge: 2023-10-05 | Discharge status: Home / Self Care | Payer: 59 | Source: Ambulatory Visit | Attending: Radiation Oncology | Admitting: Radiation Oncology

## 2023-10-05 DIAGNOSIS — Z191 Hormone sensitive malignancy status: Secondary | ICD-10-CM | POA: Diagnosis not present

## 2023-10-05 DIAGNOSIS — C61 Malignant neoplasm of prostate: Secondary | ICD-10-CM | POA: Diagnosis not present

## 2023-10-05 DIAGNOSIS — Z510 Encounter for antineoplastic radiation therapy: Secondary | ICD-10-CM | POA: Diagnosis not present

## 2023-10-05 LAB — RAD ONC ARIA SESSION SUMMARY
Course Elapsed Days: 49
Plan Fractions Treated to Date: 34
Plan Prescribed Dose Per Fraction: 2 Gy
Plan Total Fractions Prescribed: 35
Plan Total Prescribed Dose: 70 Gy
Reference Point Dosage Given to Date: 68 Gy
Reference Point Session Dosage Given: 2 Gy
Session Number: 34

## 2023-10-06 ENCOUNTER — Telehealth: Payer: Self-pay | Admitting: Neurosurgery

## 2023-10-06 ENCOUNTER — Ambulatory Visit
Admission: RE | Admit: 2023-10-06 | Discharge: 2023-10-06 | Disposition: A | Discharge status: Home / Self Care | Payer: 59 | Source: Ambulatory Visit | Attending: Radiation Oncology | Admitting: Radiation Oncology

## 2023-10-06 ENCOUNTER — Other Ambulatory Visit: Payer: Self-pay

## 2023-10-06 DIAGNOSIS — Z191 Hormone sensitive malignancy status: Secondary | ICD-10-CM | POA: Diagnosis not present

## 2023-10-06 DIAGNOSIS — Z510 Encounter for antineoplastic radiation therapy: Secondary | ICD-10-CM | POA: Diagnosis not present

## 2023-10-06 DIAGNOSIS — C61 Malignant neoplasm of prostate: Secondary | ICD-10-CM | POA: Diagnosis not present

## 2023-10-06 LAB — RAD ONC ARIA SESSION SUMMARY
Course Elapsed Days: 50
Plan Fractions Treated to Date: 35
Plan Prescribed Dose Per Fraction: 2 Gy
Plan Total Fractions Prescribed: 35
Plan Total Prescribed Dose: 70 Gy
Reference Point Dosage Given to Date: 70 Gy
Reference Point Session Dosage Given: 2 Gy
Session Number: 35

## 2023-10-06 NOTE — Telephone Encounter (Signed)
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

## 2023-10-06 NOTE — Telephone Encounter (Signed)
I notified Mr Schiffner of Dr Lucienne Capers response. I advised that he should plan on staying out of work until his 2 week follow up, and his return to work status can be discussed at that time. I advised him that he cannot drive while taking pain medication.

## 2023-10-07 NOTE — Radiation Completion Notes (Signed)
Patient Name: Marc Aguilar, Marc Aguilar MRN: [id number] Date of Birth: [birth year] Referring Physician: Nicki Reaper, M.D. Date of Service: 2023-10-07 Radiation Oncologist: [name], M.D. [facility] [place]                             RADIATION ONCOLOGY END OF TREATMENT NOTE     Diagnosis: C61 Malignant neoplasm of prostate Intent: Curative     HPI: Patient is a 62 year old male I originally consulted for salvage radiation therapy.  He originally underwent a prostatectomy for Gleason 7 (4+3).  He had positive margins left posterior mid gland to base.  Recently his PSA has climbed from 0.1-0.2.  We ran a PSMA PET scan on him.  Which I have reviewed showing no evidence of local prostate cancer recurrence in the bed adenopathy in the pelvis or periaortic retroperitoneum no evidence of visceral metastasis or skeletal metastasis.  He clinically is stable.      ==========DELIVERED PLANS==========  First Treatment Date: 2023-08-17 Last Treatment Date: 2023-10-06   Plan Name: ProstBed Site: Prostate Bed Technique: IMRT Mode: Photon Dose Per Fraction: 2 Gy Prescribed Dose (Delivered / Prescribed): 70 Gy / 70 Gy Prescribed Fxs (Delivered / Prescribed): 35 / 35     ==========ON TREATMENT VISIT DATES========== 2023-08-23, 2023-08-30, 2023-09-06, 2023-09-13, 2023-09-20, 2023-09-27, 2023-10-04     ==========UPCOMING VISITS==========       ==========APPENDIX - ON TREATMENT VISIT NOTES==========   See weekly On Treatment Notes in Epic for details in the Media tab (listed as Progress notes on the On Treatment Visit Dates listed above).

## 2023-10-09 MED ORDER — SODIUM CHLORIDE 0.9 % IV SOLN: INTRAVENOUS | Status: DC

## 2023-10-09 MED ORDER — CEFAZOLIN IN SODIUM CHLORIDE 2-0.9 GM/100ML-% IV SOLN
2.0000 g | Freq: Once | INTRAVENOUS | Status: DC
  Filled 2023-10-09: qty 100

## 2023-10-09 MED ORDER — CHLORHEXIDINE GLUCONATE 0.12 % MT SOLN
15.0000 mL | Freq: Once | OROMUCOSAL | Status: AC
  Administered 2023-10-10: 15 mL via OROMUCOSAL

## 2023-10-09 MED ORDER — CEFAZOLIN SODIUM-DEXTROSE 2-4 GM/100ML-% IV SOLN
2.0000 g | INTRAVENOUS | Status: AC
  Administered 2023-10-10: 2 g via INTRAVENOUS

## 2023-10-09 MED ORDER — ORAL CARE MOUTH RINSE: 15.0000 mL | Freq: Once | OROMUCOSAL | Status: AC

## 2023-10-10 ENCOUNTER — Other Ambulatory Visit: Payer: Self-pay

## 2023-10-10 ENCOUNTER — Encounter: Payer: Self-pay | Admitting: Neurosurgery

## 2023-10-10 ENCOUNTER — Ambulatory Visit: Payer: 59 | Admitting: Urgent Care

## 2023-10-10 ENCOUNTER — Ambulatory Visit: Payer: 59

## 2023-10-10 ENCOUNTER — Observation Stay
Admission: RE | Admit: 2023-10-10 | Discharge: 2023-10-11 | Disposition: A | Discharge status: Home / Self Care | Payer: 59 | Attending: Neurosurgery | Admitting: Neurosurgery

## 2023-10-10 ENCOUNTER — Ambulatory Visit: Payer: 59 | Admitting: Certified Registered"

## 2023-10-10 ENCOUNTER — Encounter
Admission: RE | Disposition: A | Discharge status: Home / Self Care | Payer: Self-pay | Source: Home / Self Care | Attending: Neurosurgery

## 2023-10-10 DIAGNOSIS — Z87891 Personal history of nicotine dependence: Secondary | ICD-10-CM | POA: Diagnosis not present

## 2023-10-10 DIAGNOSIS — Z01812 Encounter for preprocedural laboratory examination: Secondary | ICD-10-CM

## 2023-10-10 DIAGNOSIS — Z01818 Encounter for other preprocedural examination: Secondary | ICD-10-CM

## 2023-10-10 DIAGNOSIS — Z7982 Long term (current) use of aspirin: Secondary | ICD-10-CM | POA: Insufficient documentation

## 2023-10-10 DIAGNOSIS — E119 Type 2 diabetes mellitus without complications: Secondary | ICD-10-CM | POA: Diagnosis not present

## 2023-10-10 DIAGNOSIS — Z419 Encounter for procedure for purposes other than remedying health state, unspecified: Principal | ICD-10-CM

## 2023-10-10 DIAGNOSIS — Z79899 Other long term (current) drug therapy: Secondary | ICD-10-CM | POA: Insufficient documentation

## 2023-10-10 DIAGNOSIS — I10 Essential (primary) hypertension: Secondary | ICD-10-CM | POA: Insufficient documentation

## 2023-10-10 DIAGNOSIS — M4802 Spinal stenosis, cervical region: Principal | ICD-10-CM | POA: Insufficient documentation

## 2023-10-10 DIAGNOSIS — M50122 Cervical disc disorder at C5-C6 level with radiculopathy: Secondary | ICD-10-CM | POA: Diagnosis not present

## 2023-10-10 DIAGNOSIS — M50121 Cervical disc disorder at C4-C5 level with radiculopathy: Secondary | ICD-10-CM | POA: Diagnosis not present

## 2023-10-10 DIAGNOSIS — Z981 Arthrodesis status: Secondary | ICD-10-CM | POA: Diagnosis not present

## 2023-10-10 DIAGNOSIS — Z8546 Personal history of malignant neoplasm of prostate: Secondary | ICD-10-CM | POA: Diagnosis not present

## 2023-10-10 DIAGNOSIS — M5412 Radiculopathy, cervical region: Secondary | ICD-10-CM | POA: Diagnosis not present

## 2023-10-10 LAB — GLUCOSE, CAPILLARY
Glucose-Capillary: 179 mg/dL — ABNORMAL HIGH (ref 70–99)
Glucose-Capillary: 214 mg/dL — ABNORMAL HIGH (ref 70–99)
Glucose-Capillary: 287 mg/dL — ABNORMAL HIGH (ref 70–99)
Glucose-Capillary: 187 mg/dL — ABNORMAL HIGH (ref 70–99)

## 2023-10-10 SURGERY — CERVICAL ANTERIOR DISC ARTHROPLASTY, 2 LEVEL
Anesthesia: General | Site: Spine Cervical

## 2023-10-10 MED ORDER — LIDOCAINE HCL (CARDIAC) PF 100 MG/5ML IV SOSY
PREFILLED_SYRINGE | INTRAVENOUS | Status: DC | PRN
  Administered 2023-10-10: 60 mg via INTRAVENOUS

## 2023-10-10 MED ORDER — ATORVASTATIN CALCIUM 20 MG PO TABS
ORAL_TABLET | ORAL | Status: AC
  Filled 2023-10-10: qty 1

## 2023-10-10 MED ORDER — SUCCINYLCHOLINE CHLORIDE 200 MG/10ML IV SOSY
PREFILLED_SYRINGE | INTRAVENOUS | Status: DC | PRN
  Administered 2023-10-10: 140 mg via INTRAVENOUS

## 2023-10-10 MED ORDER — METHOCARBAMOL 500 MG PO TABS
500.0000 mg | ORAL_TABLET | Freq: Three times a day (TID) | ORAL | Status: DC | PRN
  Administered 2023-10-10: 500 mg via ORAL

## 2023-10-10 MED ORDER — PHENYLEPHRINE 80 MCG/ML (10ML) SYRINGE FOR IV PUSH (FOR BLOOD PRESSURE SUPPORT)
PREFILLED_SYRINGE | INTRAVENOUS | Status: DC | PRN
  Administered 2023-10-10: 160 ug via INTRAVENOUS
  Administered 2023-10-10: 80 ug via INTRAVENOUS
  Administered 2023-10-10: 160 ug via INTRAVENOUS

## 2023-10-10 MED ORDER — DEXAMETHASONE SODIUM PHOSPHATE 10 MG/ML IJ SOLN
INTRAMUSCULAR | Status: AC
  Filled 2023-10-10: qty 1

## 2023-10-10 MED ORDER — SODIUM CHLORIDE 0.9 % IV SOLN: 250.0000 mL | INTRAVENOUS | Status: DC

## 2023-10-10 MED ORDER — CELECOXIB 200 MG PO CAPS
200.0000 mg | ORAL_CAPSULE | Freq: Two times a day (BID) | ORAL | Status: DC
  Administered 2023-10-10 – 2023-10-11 (×3): 200 mg via ORAL

## 2023-10-10 MED ORDER — FENTANYL CITRATE (PF) 100 MCG/2ML IJ SOLN
INTRAMUSCULAR | Status: AC
  Filled 2023-10-10: qty 2

## 2023-10-10 MED ORDER — 0.9 % SODIUM CHLORIDE (POUR BTL) OPTIME
TOPICAL | Status: DC | PRN
  Administered 2023-10-10: 500 mL

## 2023-10-10 MED ORDER — FENTANYL CITRATE (PF) 100 MCG/2ML IJ SOLN
INTRAMUSCULAR | Status: DC | PRN
  Administered 2023-10-10: 25 ug via INTRAVENOUS
  Administered 2023-10-10 (×2): 50 ug via INTRAVENOUS
  Administered 2023-10-10: 25 ug via INTRAVENOUS
  Administered 2023-10-10: 50 ug via INTRAVENOUS

## 2023-10-10 MED ORDER — AMLODIPINE BESYLATE 5 MG PO TABS
10.0000 mg | ORAL_TABLET | Freq: Every day | ORAL | Status: DC
  Administered 2023-10-10 – 2023-10-11 (×2): 10 mg via ORAL

## 2023-10-10 MED ORDER — ACETAMINOPHEN 10 MG/ML IV SOLN
INTRAVENOUS | Status: AC
  Filled 2023-10-10: qty 100

## 2023-10-10 MED ORDER — SODIUM CHLORIDE 0.9% FLUSH
3.0000 mL | Freq: Two times a day (BID) | INTRAVENOUS | Status: DC
  Administered 2023-10-10 – 2023-10-11 (×3): 3 mL via INTRAVENOUS

## 2023-10-10 MED ORDER — MENTHOL 3 MG MT LOZG: 1.0000 | LOZENGE | OROMUCOSAL | Status: DC | PRN

## 2023-10-10 MED ORDER — REMIFENTANIL HCL 1 MG IV SOLR
INTRAVENOUS | Status: AC
  Filled 2023-10-10: qty 1000

## 2023-10-10 MED ORDER — CITALOPRAM HYDROBROMIDE 20 MG PO TABS
40.0000 mg | ORAL_TABLET | Freq: Every day | ORAL | Status: DC
  Administered 2023-10-10 – 2023-10-11 (×2): 40 mg via ORAL
  Filled 2023-10-10 (×2): qty 2

## 2023-10-10 MED ORDER — MIDAZOLAM HCL 2 MG/2ML IJ SOLN
INTRAMUSCULAR | Status: DC | PRN
  Administered 2023-10-10: 2 mg via INTRAVENOUS

## 2023-10-10 MED ORDER — ACETAMINOPHEN 10 MG/ML IV SOLN
1000.0000 mg | Freq: Once | INTRAVENOUS | Status: DC | PRN
  Administered 2023-10-10: 1000 mg via INTRAVENOUS

## 2023-10-10 MED ORDER — DEXAMETHASONE SODIUM PHOSPHATE 10 MG/ML IJ SOLN
INTRAMUSCULAR | Status: DC | PRN
  Administered 2023-10-10: 5 mg via INTRAVENOUS

## 2023-10-10 MED ORDER — PHENYLEPHRINE 80 MCG/ML (10ML) SYRINGE FOR IV PUSH (FOR BLOOD PRESSURE SUPPORT)
PREFILLED_SYRINGE | INTRAVENOUS | Status: AC
  Filled 2023-10-10: qty 10

## 2023-10-10 MED ORDER — ONDANSETRON HCL 4 MG/2ML IJ SOLN: 4.0000 mg | Freq: Four times a day (QID) | INTRAMUSCULAR | Status: DC | PRN

## 2023-10-10 MED ORDER — PROPOFOL 10 MG/ML IV BOLUS
INTRAVENOUS | Status: DC | PRN
  Administered 2023-10-10: 150 mg via INTRAVENOUS

## 2023-10-10 MED ORDER — TIRZEPATIDE 2.5 MG/0.5ML ~~LOC~~ SOAJ: 2.5000 mg | SUBCUTANEOUS | Status: DC

## 2023-10-10 MED ORDER — ONDANSETRON HCL 4 MG PO TABS: 4.0000 mg | ORAL_TABLET | Freq: Four times a day (QID) | ORAL | Status: DC | PRN

## 2023-10-10 MED ORDER — INSULIN ASPART 100 UNIT/ML IJ SOLN
INTRAMUSCULAR | Status: AC
  Filled 2023-10-10: qty 1

## 2023-10-10 MED ORDER — REMIFENTANIL HCL 1 MG IV SOLR
INTRAVENOUS | Status: DC | PRN
  Administered 2023-10-10: .15 ug/kg/min via INTRAVENOUS

## 2023-10-10 MED ORDER — PHENOL 1.4 % MT LIQD: 1.0000 | OROMUCOSAL | Status: DC | PRN

## 2023-10-10 MED ORDER — OXYCODONE HCL 5 MG PO TABS: 10.0000 mg | ORAL_TABLET | ORAL | Status: DC | PRN

## 2023-10-10 MED ORDER — ORAL CARE MOUTH RINSE: 15.0000 mL | OROMUCOSAL | Status: DC | PRN

## 2023-10-10 MED ORDER — SODIUM CHLORIDE 0.9% FLUSH
3.0000 mL | INTRAVENOUS | Status: DC | PRN
Start: 2023-10-10 — End: 2023-10-11

## 2023-10-10 MED ORDER — TRAZODONE HCL 100 MG PO TABS
200.0000 mg | ORAL_TABLET | Freq: Every day | ORAL | Status: DC
  Administered 2023-10-10: 200 mg via ORAL
  Filled 2023-10-10: qty 2

## 2023-10-10 MED ORDER — FENTANYL CITRATE (PF) 100 MCG/2ML IJ SOLN
25.0000 ug | INTRAMUSCULAR | Status: DC | PRN
  Administered 2023-10-10 (×6): 25 ug via INTRAVENOUS

## 2023-10-10 MED ORDER — ACETAMINOPHEN 500 MG PO TABS
1000.0000 mg | ORAL_TABLET | Freq: Four times a day (QID) | ORAL | Status: AC
  Administered 2023-10-10 – 2023-10-11 (×4): 1000 mg via ORAL

## 2023-10-10 MED ORDER — OXYCODONE HCL 5 MG PO TABS
5.0000 mg | ORAL_TABLET | ORAL | Status: DC | PRN
  Administered 2023-10-10 – 2023-10-11 (×2): 5 mg via ORAL

## 2023-10-10 MED ORDER — MORPHINE SULFATE (PF) 4 MG/ML IV SOLN
INTRAVENOUS | Status: AC
  Filled 2023-10-10: qty 1

## 2023-10-10 MED ORDER — ATORVASTATIN CALCIUM 20 MG PO TABS
20.0000 mg | ORAL_TABLET | Freq: Every day | ORAL | Status: DC
  Administered 2023-10-10: 20 mg via ORAL

## 2023-10-10 MED ORDER — BUPROPION HCL ER (XL) 300 MG PO TB24
300.0000 mg | ORAL_TABLET | Freq: Every day | ORAL | Status: DC
  Administered 2023-10-10 – 2023-10-11 (×2): 300 mg via ORAL
  Filled 2023-10-10 (×2): qty 1

## 2023-10-10 MED ORDER — PANTOPRAZOLE SODIUM 40 MG PO TBEC
40.0000 mg | DELAYED_RELEASE_TABLET | Freq: Two times a day (BID) | ORAL | Status: DC
  Administered 2023-10-10 – 2023-10-11 (×2): 40 mg via ORAL

## 2023-10-10 MED ORDER — PROPOFOL 500 MG/50ML IV EMUL
INTRAVENOUS | Status: DC | PRN
  Administered 2023-10-10: 80 ug/kg/min via INTRAVENOUS

## 2023-10-10 MED ORDER — INSULIN ASPART 100 UNIT/ML IJ SOLN: 0.0000 [IU] | Freq: Every day | INTRAMUSCULAR | Status: DC

## 2023-10-10 MED ORDER — OXYCODONE HCL 5 MG PO TABS
5.0000 mg | ORAL_TABLET | Freq: Once | ORAL | Status: AC | PRN
  Administered 2023-10-10: 5 mg via ORAL

## 2023-10-10 MED ORDER — ACETAMINOPHEN 500 MG PO TABS
ORAL_TABLET | ORAL | Status: AC
  Filled 2023-10-10: qty 2

## 2023-10-10 MED ORDER — CHLORHEXIDINE GLUCONATE 0.12 % MT SOLN
OROMUCOSAL | Status: AC
  Filled 2023-10-10: qty 15

## 2023-10-10 MED ORDER — PROPOFOL 1000 MG/100ML IV EMUL
INTRAVENOUS | Status: AC
  Filled 2023-10-10: qty 100

## 2023-10-10 MED ORDER — ONDANSETRON HCL 4 MG/2ML IJ SOLN
INTRAMUSCULAR | Status: DC | PRN
  Administered 2023-10-10: 4 mg via INTRAVENOUS

## 2023-10-10 MED ORDER — EPHEDRINE SULFATE-NACL 50-0.9 MG/10ML-% IV SOSY
PREFILLED_SYRINGE | INTRAVENOUS | Status: DC | PRN
  Administered 2023-10-10 (×3): 10 mg via INTRAVENOUS

## 2023-10-10 MED ORDER — CEFAZOLIN SODIUM-DEXTROSE 2-4 GM/100ML-% IV SOLN
INTRAVENOUS | Status: AC
  Filled 2023-10-10: qty 100

## 2023-10-10 MED ORDER — MIDAZOLAM HCL 2 MG/2ML IJ SOLN
INTRAMUSCULAR | Status: AC
  Filled 2023-10-10: qty 2

## 2023-10-10 MED ORDER — CELECOXIB 200 MG PO CAPS
ORAL_CAPSULE | ORAL | Status: AC
  Filled 2023-10-10: qty 1

## 2023-10-10 MED ORDER — AMLODIPINE BESYLATE 5 MG PO TABS
ORAL_TABLET | ORAL | Status: AC
  Filled 2023-10-10: qty 2

## 2023-10-10 MED ORDER — INSULIN ASPART 100 UNIT/ML IJ SOLN
0.0000 [IU] | Freq: Three times a day (TID) | INTRAMUSCULAR | Status: DC
  Administered 2023-10-10: 8 [IU] via SUBCUTANEOUS
  Administered 2023-10-11: 3 [IU] via SUBCUTANEOUS
  Administered 2023-10-11: 5 [IU] via SUBCUTANEOUS

## 2023-10-10 MED ORDER — OXYCODONE HCL 5 MG PO TABS
ORAL_TABLET | ORAL | Status: AC
  Filled 2023-10-10: qty 1

## 2023-10-10 MED ORDER — BUPIVACAINE-EPINEPHRINE (PF) 0.5% -1:200000 IJ SOLN
INTRAMUSCULAR | Status: DC | PRN
  Administered 2023-10-10: 10 mL

## 2023-10-10 MED ORDER — EZETIMIBE 10 MG PO TABS
ORAL_TABLET | ORAL | Status: AC
  Filled 2023-10-10: qty 1

## 2023-10-10 MED ORDER — OXYCODONE HCL 5 MG/5ML PO SOLN
5.0000 mg | Freq: Once | ORAL | Status: AC | PRN
Start: 2023-10-10 — End: 2023-10-10

## 2023-10-10 MED ORDER — SURGIFLO WITH THROMBIN (HEMOSTATIC MATRIX KIT) OPTIME
TOPICAL | Status: DC | PRN
  Administered 2023-10-10: 1 via TOPICAL

## 2023-10-10 MED ORDER — DROPERIDOL 2.5 MG/ML IJ SOLN: 0.6250 mg | Freq: Once | INTRAMUSCULAR | Status: DC | PRN

## 2023-10-10 MED ORDER — METHOCARBAMOL 500 MG PO TABS
ORAL_TABLET | ORAL | Status: AC
  Filled 2023-10-10: qty 1

## 2023-10-10 MED ORDER — ONDANSETRON HCL 4 MG/2ML IJ SOLN
INTRAMUSCULAR | Status: AC
  Filled 2023-10-10: qty 2

## 2023-10-10 MED ORDER — LIDOCAINE HCL (PF) 2 % IJ SOLN
INTRAMUSCULAR | Status: AC
  Filled 2023-10-10: qty 5

## 2023-10-10 MED ORDER — INSULIN ASPART 100 UNIT/ML IJ SOLN
3.0000 [IU] | Freq: Once | INTRAMUSCULAR | Status: AC
  Administered 2023-10-10: 3 [IU] via SUBCUTANEOUS

## 2023-10-10 MED ORDER — GLIPIZIDE ER 10 MG PO TB24
10.0000 mg | ORAL_TABLET | Freq: Every day | ORAL | Status: DC
  Administered 2023-10-11: 10 mg via ORAL
  Filled 2023-10-10: qty 1

## 2023-10-10 MED ORDER — MORPHINE SULFATE (PF) 4 MG/ML IV SOLN
1.0000 mg | INTRAVENOUS | Status: DC | PRN
  Administered 2023-10-10: 1 mg via INTRAVENOUS

## 2023-10-10 MED ORDER — BUPIVACAINE-EPINEPHRINE (PF) 0.5% -1:200000 IJ SOLN
INTRAMUSCULAR | Status: AC
  Filled 2023-10-10: qty 10

## 2023-10-10 MED ORDER — EZETIMIBE 10 MG PO TABS
10.0000 mg | ORAL_TABLET | Freq: Every day | ORAL | Status: DC
  Administered 2023-10-10 – 2023-10-11 (×2): 10 mg via ORAL

## 2023-10-10 MED ORDER — PANTOPRAZOLE SODIUM 40 MG PO TBEC
DELAYED_RELEASE_TABLET | ORAL | Status: AC
  Filled 2023-10-10: qty 1

## 2023-10-10 MED ORDER — PROPOFOL 10 MG/ML IV BOLUS
INTRAVENOUS | Status: AC
  Filled 2023-10-10: qty 20

## 2023-10-10 SURGICAL SUPPLY — 49 items
BUR NEURO DRILL SOFT 3.0X3.8M (BURR) ×1 IMPLANT
CHLORAPREP W/TINT 26 (MISCELLANEOUS) ×2 IMPLANT
COUNTER NEEDLE 20/40 LG (NEEDLE) ×1 IMPLANT
DERMABOND ADVANCED .7 DNX12 (GAUZE/BANDAGES/DRESSINGS) ×1 IMPLANT
DISC MOBI-C CERVICAL 17X17 H5 (Screw) IMPLANT
DISC MOBI-C CERVICAL 17X17 H6 (Screw) IMPLANT
DRAPE C ARM PK CFD 31 SPINE (DRAPES) ×1 IMPLANT
DRAPE LAPAROTOMY 77X122 PED (DRAPES) ×1 IMPLANT
DRAPE MICROSCOPE SPINE 48X150 (DRAPES) ×1 IMPLANT
DRAPE SURG 17X11 SM STRL (DRAPES) ×1 IMPLANT
DRSG OPSITE POSTOP 4X6 (GAUZE/BANDAGES/DRESSINGS) ×2 IMPLANT
ELECT CAUTERY BLADE TIP 2.5 (TIP)
ELECT REM PT RETURN 9FT ADLT (ELECTROSURGICAL) ×1
ELECTRODE CAUTERY BLDE TIP 2.5 (TIP) ×1 IMPLANT
ELECTRODE REM PT RTRN 9FT ADLT (ELECTROSURGICAL) ×1 IMPLANT
FEE INTRAOP CADWELL SUPPLY NCS (MISCELLANEOUS) IMPLANT
FEE INTRAOP MONITOR IMPULS NCS (MISCELLANEOUS) IMPLANT
GLOVE BIOGEL PI IND STRL 6.5 (GLOVE) ×1 IMPLANT
GLOVE BIOGEL PI IND STRL 8.5 (GLOVE) ×2 IMPLANT
GLOVE SURG SYN 6.5 ES PF (GLOVE) ×1 IMPLANT
GLOVE SURG SYN 6.5 PF PI (GLOVE) ×1 IMPLANT
GLOVE SURG SYN 8.5 E (GLOVE) ×3 IMPLANT
GLOVE SURG SYN 8.5 PF PI (GLOVE) ×3 IMPLANT
GOWN SRG LRG LVL 4 IMPRV REINF (GOWNS) ×1 IMPLANT
GOWN SRG XL LVL 3 NONREINFORCE (GOWNS) ×1 IMPLANT
GRADUATE 1200CC STRL 31836 (MISCELLANEOUS) ×1 IMPLANT
INTRAOP CADWELL SUPPLY FEE NCS (MISCELLANEOUS)
INTRAOP MONITOR FEE IMPULS NCS (MISCELLANEOUS)
JET LAVAGE IRRISEPT WOUND (IRRIGATION / IRRIGATOR)
KIT TURNOVER KIT A (KITS) ×1 IMPLANT
LAVAGE JET IRRISEPT WOUND (IRRIGATION / IRRIGATOR) ×1 IMPLANT
MANIFOLD NEPTUNE II (INSTRUMENTS) ×1 IMPLANT
MARKER SKIN DUAL TIP RULER LAB (MISCELLANEOUS) ×2 IMPLANT
NDL SAFETY ECLIPSE 18X1.5 (NEEDLE) IMPLANT
NS IRRIG 1000ML POUR BTL (IV SOLUTION) ×1 IMPLANT
PACK LAMINECTOMY ARMC (PACKS) ×1 IMPLANT
PAD ARMBOARD 7.5X6 YLW CONV (MISCELLANEOUS) ×1 IMPLANT
PIN CASPAR SPINAL 14MM CERVICA (PIN) IMPLANT
SPONGE KITTNER 5P (MISCELLANEOUS) ×1 IMPLANT
STAPLER SKIN PROX 35W (STAPLE) IMPLANT
SURGIFLO W/THROMBIN 8M KIT (HEMOSTASIS) ×1 IMPLANT
SUT STRATA 3-0 15 PS-2 (SUTURE) IMPLANT
SUT VICRYL 3-0 CR8 SH (SUTURE) ×1 IMPLANT
SYR 30ML LL (SYRINGE) ×1 IMPLANT
TAPE CLOTH 3X10 WHT NS LF (GAUZE/BANDAGES/DRESSINGS) ×1 IMPLANT
TOWEL OR 17X26 4PK STRL BLUE (TOWEL DISPOSABLE) ×3 IMPLANT
TRAP FLUID SMOKE EVACUATOR (MISCELLANEOUS) ×1 IMPLANT
TRAY FOLEY SLVR 16FR LF STAT (SET/KITS/TRAYS/PACK) IMPLANT
TUBING CONNECTING 10 (TUBING) ×1 IMPLANT

## 2023-10-10 NOTE — Progress Notes (Addendum)
error 

## 2023-10-10 NOTE — Plan of Care (Signed)

## 2023-10-10 NOTE — Op Note (Signed)
Indications: [name] is a 62 y.o. male with M54.12 cervical radiculopathy, M48.02 cervical stenosis . He failed conservative management prompting surgical intervention.  Findings: foraminal stenosis  Preoperative Diagnosis: M54.12 cervical radiculopathy, M48.02 cervical stenosis  Postoperative Diagnosis: same   EBL: 25 ml IVF: see AR Drains: none Disposition: Extubated and Stable to PACU Complications: none  No foley catheter was placed.   Preoperative Note:   Risks of surgery discussed include: infection, bleeding, stroke, coma, death, paralysis, CSF leak, nerve/spinal cord injury, numbness, tingling, weakness, complex regional pain syndrome, recurrent stenosis and/or disc herniation, vascular injury, development of instability, neck/back pain, need for further surgery, persistent symptoms, development of deformity, and the risks of anesthesia. The patient understood these risks and agreed to proceed.  Operative Note:   Operative Note:  Procedure:  1) Cervical Disc Arthroplasty at C4/5 and C5/6 using a LDR Mobi-C device   Procedure: After obtaining informed consent, the patient taken to the operating room, placed in supine position, general anesthesia induced.  The patient had a small shoulder roll placed behind the neck.  The patient received preop antibiotics and IV Decadron.  The patient had a neck incision outlined, was prepped and draped in usual sterile fashion. The incision was injected with local anesthetic.   An incision was opened, dissection taken down medial to the carotid artery and jugular vein, lateral to the trachea and esophagus.  The prevertebral fascia identified and a localizing x-ray demonstrated the correct level.  The longus colli were dissected laterally, and self-retaining retractors placed to open the operative field. The microscope was then brought into the field.  With this complete, distractor pins were placed in the vertebral bodies of C4 and C5.  The distractor was placed, then the annulus at C4/5 was opened using a bovie.  Curettes and pituitary rongeurs used to remove the majority of disk, then the drill was used to remove the posterior osteophyte and begin the foraminotomies. The nerve hook was used to elevate the posterior longitudinal ligament, which was then removed with Kerrison rongeurs. The microblunt nerve hook could be passed out the foramen bilateral at each level.   Meticulous hemostasis was obtained.  A trial spacer was used to size the disc space. Using flouroscopic guidance, a 17 mm width x 17 mm depth x 6 mm height Mobi-C was then inserted in the prepared disc space.  The C4 pin was moved to C6, then the distractor repositioned.   Then, the annulus at C5/6 was opened using a bovie.  Curettes and pituitary rongeurs used to remove the majority of disk, then the drill was used to remove the posterior osteophyte and begin the foraminotomies. The nerve hook was used to elevate the posterior longitudinal ligament, which was then removed with Kerrison rongeurs. The microblunt nerve hook could be passed out the foramen bilateral at each level.   Meticulous hemostasis was obtained.  A trial spacer was used to size the disc space. Using flouroscopic guidance, a 17 mm width x 17 mm depth x 5 mm height Mobi-C was then inserted in the prepared disc space.  The caspar distractor was removed, and bone wax used for hemostasis. Final AP and lateral radiographs were taken.   With the disc arthroplasties in good position, the wound was irrigated copiously and meticulous hemostasis obtained.  Wound was closed in 2 layers using interrupted inverted 3-0 Vicryl sutures.  The wound was dressed with dermabond, the head of bed at 30 degrees, taken to recovery room in stable  condition.  No new postop neurological deficits were identified.  Sponge and pattie counts were correct at the end of the procedure.   I performed the entire procedure.   Venetia Night MD

## 2023-10-10 NOTE — Transfer of Care (Signed)
Immediate Anesthesia Transfer of Care Note  Patient: Marc Aguilar  Procedure(s) Performed: C4-6 ARTHROPLASTY (Spine Cervical)  Patient Location: PACU  Anesthesia Type:General  Level of Consciousness: drowsy  Airway & Oxygen Therapy: Patient Spontanous Breathing and Patient connected to face mask oxygen  Post-op Assessment: Report given to RN and Post -op Vital signs reviewed and stable  Post vital signs: Reviewed and stable  Last Vitals:  Vitals Value Taken Time  BP 117/77 10/10/23 0938  Temp    Pulse 98 10/10/23 0941  Resp 18 10/10/23 0941  SpO2 99 % 10/10/23 0941  Vitals shown include unfiled device data.  Last Pain:  Vitals:   10/10/23 0647  TempSrc: Tympanic  PainSc: 7          Complications: No notable events documented.

## 2023-10-10 NOTE — H&P (Signed)
Referring Physician:  Venetia Night, MD [address] [place],  [place] [address]  Primary Physician:  Lorre Munroe, NP  History of Present Illness: 10/10/2023 Marc Aguilar presents today with continued symptoms.  08/18/2023 Marc Aguilar to see me.  He has continued pain worse in his left arm than right arm.  He continues to have neck pain.  He has tried physical therapy without improvement.  He continues to have radicular symptoms.  He has numbness in his hands.  He is having some discomfort in his left forearm.  Additionally, he has numbness in his hands in a C6 distribution at times.  02/08/2023 I attempted to call for different times without any answer. We will reschedule appointment.  12/16/2022 Marc Aguilar is here today with a chief complaint of neck pain and this in his arms.  He has been having problems for approximate 1 year.  I previously saw him in June 2023 after which she had a right shoulder surgery which she has done well from.  He still has some stiffness in his arm with movement of his shoulder, but it is much better than it was.  He has had injections in his shoulder which helped.  He had a suprascapular injection in December which helped his pain.  He also has had an epidural steroid injection which helped.  He has pain between 2 and 10 out of 10.  Laying on his back makes his hands go numb.  All 5 fingers go numb at times on the right greater than left hand.  Bowel/Bladder Dysfunction: none  Conservative measures:  Physical therapy:  yes  [facility] in [place] just abot three weeks ago Multimodal medical therapy including regular antiinflammatories:   acetaminophen, oxycodone, and trazodone.   Injections:  epidural steroid injections 10/04/22 Right Suprascapular Nerve Block  06/30/22 C7- T1 ESI  Past Surgery: Shoulder repair on right side 05/2022  Marc Aguilar has no symptoms of cervical myelopathy.  The symptoms are causing a significant  impact on the patient's life.   I have utilized the care everywhere function in epic to review the outside records available from external health systems.  Review of Systems:  A 10 point review of systems is negative, except for the pertinent positives and negatives detailed in the HPI.  Past Medical History: Past Medical History:  Diagnosis Date   Anxiety    Arthritis    "knees and hips" (02/28/2018)   Cervical radiculitis    Chicken pox    Depression    GERD (gastroesophageal reflux disease)    Heart murmur    Hernia, abdominal    High cholesterol    History of gout    History of hiatal hernia    Hyperlipidemia associated with type 2 diabetes mellitus (HCC)    Hypertension    Migraine    "probably monthly" (02/28/2018)   Motion sickness    ocean boats, circular motion   Neck pain    OSA on CPAP    Osteoarthritis    Prostate cancer (HCC) 2021   surgery and radiation treatments   Refusal of blood transfusions as patient is Jehovah's Witness    Seasonal allergies    Type II diabetes mellitus (HCC)     Past Surgical History: Past Surgical History:  Procedure Laterality Date   CARDIAC CATHETERIZATION  02/28/2018   ESOPHAGOGASTRODUODENOSCOPY (EGD) WITH PROPOFOL N/A 03/17/2023   Procedure: ESOPHAGOGASTRODUODENOSCOPY (EGD) WITH PROPOFOL;  Surgeon: [name], MD;  Location: [facility]GERY [facility];  Service: Endoscopy;  Laterality: N/A;  Diabetic   ESOPHAGOGASTRODUODENOSCOPY (EGD) WITH PROPOFOL N/A 04/18/2023   Procedure: ESOPHAGOGASTRODUODENOSCOPY (EGD) WITH PROPOFOL;  Surgeon: [name], MD;  Location: [facility];  Service: Endoscopy;  Laterality: N/A;   LEFT HEART CATH AND CORONARY ANGIOGRAPHY N/A 02/28/2018   Procedure: LEFT HEART CATH AND CORONARY ANGIOGRAPHY;  Surgeon: Tonny Bollman, MD;  Location: [facility] INVASIVE CV LAB;  Service: Cardiovascular;  Laterality: N/A;   ROBOT ASSISTED LAPAROSCOPIC RADICAL PROSTATECTOMY N/A 11/19/2019   Procedure: XI ROBOTIC  ASSISTED LAPAROSCOPIC RADICAL PROSTATECTOMY WITH LYMPH NODE DISSECTION;  Surgeon: Vanna Scotland, MD;  Location: [facility] ORS;  Service: Urology;  Laterality: N/A;   SHOULDER ARTHROSCOPY WITH ROTATOR CUFF REPAIR AND SUBACROMIAL DECOMPRESSION Right 05/21/2022   Procedure: Right shoulder arthroscopic rotator cuff repair (supraspinatus & subscapularis), subacromial decompression, and biceps tenodesis;  Surgeon: Signa Kell, MD;  Location: [facility] ORS;  Service: Orthopedics;  Laterality: Right;   VASECTOMY     WRIST GANGLION EXCISION Left 1984    Allergies: Allergies as of 08/29/2023 - Review Complete 08/18/2023  Allergen Reaction Noted   Tape Rash 05/04/2016    Medications: Current Meds  Medication Sig   amLODipine (NORVASC) 10 MG tablet Take 1 tablet (10 mg total) by mouth daily.   aspirin 325 MG tablet Take 325 mg by mouth daily.   atorvastatin (LIPITOR) 20 MG tablet Take 1 tablet (20 mg total) by mouth daily. (Patient taking differently: Take 20 mg by mouth at bedtime.)   Blood Glucose Monitoring Suppl (BLOOD GLUCOSE MONITOR SYSTEM) w/Device KIT Use as directed to check blood sugar once daily.   buPROPion (WELLBUTRIN XL) 300 MG 24 hr tablet Take 1 tablet (300 mg total) by mouth daily.   citalopram (CELEXA) 40 MG tablet Take 1 tablet (40 mg total) by mouth daily.   ezetimibe (ZETIA) 10 MG tablet Take 1 tablet (10 mg total) by mouth daily.   Glucose Blood (BLOOD GLUCOSE TEST STRIPS) STRP Use to check blood sugar once a day.   Lancets (FREESTYLE) lancets Use as directed to check blood sugar once daily.   pantoprazole (PROTONIX) 40 MG tablet Take 1 tablet (40 mg total) by mouth 2 (two) times daily before a meal.   tadalafil (CIALIS) 20 MG tablet Take 1 tablet (20 mg total) by mouth daily as needed for erectile dysfunction.   [DISCONTINUED] glipiZIDE (GLUCOTROL XL) 10 MG 24 hr tablet Take 1 tablet (10 mg total) by mouth daily with breakfast.   [DISCONTINUED] Lancet Device MISC Use to check blood  sugar once a day.  DX: E11.9 May substitute to any manufacturer covered by patient's insurance.   [DISCONTINUED] methocarbamol (ROBAXIN) 500 MG tablet Take 1 tablet (500 mg total) by mouth every 8 (eight) hours as needed for muscle spasms.   [DISCONTINUED] ondansetron (ZOFRAN-ODT) 4 MG disintegrating tablet Take 1 tablet (4 mg total) by mouth every 8 (eight) hours as needed for nausea or vomiting. (Patient not taking: Reported on 09/27/2023)   [DISCONTINUED] traZODone (DESYREL) 100 MG tablet Take 2 tablets (200 mg total) by mouth at bedtime.    Social History: Social History   Tobacco Use   Smoking status: Former    Current packs/day: 0.00    Average packs/day: 1 pack/day for 27.0 years (27.0 ttl pk-yrs)    Types: Cigarettes    Start date: 10/18/1982    Quit date: 10/18/2009    Years since quitting: 13.9   Smokeless tobacco: Former    Types: Tree surgeon  date: 10/18/1994  Vaping Use   Vaping status: Never Used  Substance Use Topics   Alcohol use: Yes    Alcohol/week: 21.0 standard drinks of alcohol    Types: 21 Cans of beer per week    Comment: 6 beers daily   Drug use: Never    Family Medical History: Family History  Problem Relation Age of Onset   Diabetes Mother    Hyperlipidemia Mother    Bladder Cancer Father    Liver cancer Sister    Diabetes Sister    Diabetes Maternal Aunt    Arthritis Maternal Grandmother    Diabetes Maternal Grandmother    Arthritis Maternal Grandfather    Arthritis Paternal Grandmother    Diabetes Brother    Heart disease Brother    Colon cancer Neg Hx     Physical Examination: Vitals:   10/10/23 0647  BP: (!) 124/98  Pulse: 97  Resp: 15  Temp: (!) 97.1 F (36.2 C)  SpO2: 99%   Heart sounds normal no MRG. Chest Clear to Auscultation Bilaterally.  MAEW with 5/5 throughout  Slightly diminished sensation L C6 distribution   Medical Decision Making  Imaging: MRI C spine 02/16/2022 osteoporosis of 8:12 AM: Prior total visit  cardiovascular Disc levels:   Multilevel disc degeneration, greatest at C4-C5 (mild-to-moderate) and C5-C6 (moderate).   C2-C3: Trace grade 1 anterolisthesis. No significant disc herniation or stenosis.   C3-C4: Small central disc protrusion. Uncovertebral hypertrophy (predominantly on the right). The disc protrusion results in mild focal effacement of the ventral thecal sac and may contact the ventral spinal cord. Mild right neural foraminal narrowing.   C4-C5: Trace grade 1 retrolisthesis. Disc bulge. Uncovertebral hypertrophy (predominantly on the right). The disc protrusion effaces the ventral thecal sac, mildly narrowing the spinal canal and contacting the ventral spinal cord. Moderate right neural foraminal narrowing.   C5-C6: Disc bulge with left greater than right uncovertebral hypertrophy. Mild relative spinal canal narrowing (without significant spinal cord mass effect). Bilateral neural foraminal narrowing (moderate right, moderate/severe left).   C6-C7: No significant disc herniation or stenosis.   C7-T1: Mild facet arthrosis on the left. No significant disc herniation or stenosis.   IMPRESSION: Cervical spondylosis, as outlined.   No more than mild spinal canal stenosis.   Multilevel foraminal stenosis, as detailed and greatest on the right at C4-C5 (moderate) and bilaterally at C5-C6 (moderate right, moderate/severe left).   Disc degeneration is greatest at C4-C5 (mild-to-moderate) and C5-C6 (moderate).   Nonspecific straightening of the expected cervical lordosis.     Electronically Signed   By: Jackey Loge D.O.   On: 03/10/2022 18:57    I have personally reviewed the images and agree with the above interpretation.  Assessment and Plan: Marc Aguilar is a pleasant 62 y.o. male with neck pain and likely cervical radiculopathy.  He has involvement at C4-5 and C5-6.  He has severe neuroforaminal compression at C5-6 and moderate at C4-5.  He has tried and  failed conservative management with physical therapy and medications.  We will proceed with cervical disc arthroplasty at C4-6.     Shawntee Mainwaring K. Myer Haff MD, [facility]

## 2023-10-10 NOTE — Anesthesia Procedure Notes (Signed)
Procedure Name: Intubation Date/Time: 10/10/2023 7:29 AM  Performed by: Monico Hoar, CRNAPre-anesthesia Checklist: Patient identified, Patient being monitored, Timeout performed, Emergency Drugs available and Suction available Patient Re-evaluated:Patient Re-evaluated prior to induction Oxygen Delivery Method: Circle system utilized Preoxygenation: Pre-oxygenation with 100% oxygen Induction Type: IV induction Ventilation: Mask ventilation without difficulty Laryngoscope Size: Mac and 4 Grade View: Grade I Tube type: Oral Tube size: 7.5 mm Number of attempts: 1 Airway Equipment and Method: Stylet Placement Confirmation: ETT inserted through vocal cords under direct vision, positive ETCO2 and breath sounds checked- equal and bilateral Secured at: 22 cm Tube secured with: Tape Dental Injury: Teeth and Oropharynx as per pre-operative assessment

## 2023-10-10 NOTE — Evaluation (Signed)
Occupational Therapy Evaluation Patient Details Name: Marc Aguilar MRN: [id number] DOB: [birth year] Today's Date: 10/10/2023   History of Present Illness Pt is a 62 y/o M admitted on 10/10/23 for scheduled cervical disc arthroplasty at C4/5 and C5/6. PMH: anxiety, cervical radiculitis, depression, GERD, heart murmur, gout, hiatal hernia, OSA on CPAP, OA, prostate CA, DM2   Clinical Impression   Pt was seen for OT evaluation this date. 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  Pt presents to acute OT demonstrating impaired ADL performance and functional mobility 2/2 pain, but otherwise appears near his baseline. He was educated on all cervical precautions and compensatory strategies, as well as AE/AD/DME to use for ease with ADL performance and transfers. Written handout provided to maximize carryover of all information. He demo STS from recliner with SBA, verb cues and ambulated to the bathroom and back using RW with SBA. Urinated in standing with no LOB, able to perform hand hygiene at sink with SUP. Doffed/donned sock with SUP seated via figure 4. Ice pack applied to posterior neck, pain meds provided by nurse during session. Pt in 9/10 pain. Answered all questions and can attempt to check back in tomorrow prior to DC to ensure safety with dressing/other ADLs prior to DC. Do not anticipate the need for follow up OT services upon acute hospital DC.        If plan is discharge home, recommend the following: A little help with walking and/or transfers;Assist for transportation;Assistance with cooking/housework    Functional Status Assessment  Patient has had a recent decline in their functional status and demonstrates the ability to make significant improvements in function in a reasonable and predictable amount of time.  Equipment Recommendations  None recommended by OT    Recommendations for Other  Services       Precautions / Restrictions Precautions Precautions: Fall;Cervical Restrictions Weight Bearing Restrictions Per Provider Order: No      Mobility Bed Mobility               General bed mobility comments: NT up in recliner pre/post session    Transfers Overall transfer level: Needs assistance Equipment used: Rolling walker (2 wheels) Transfers: Sit to/from Stand Sit to Stand: Contact guard assist, Supervision           General transfer comment: CGA/SBA for STS from recliner mostly pushing through legs; ambulated to the bathroom and back using RW with close SBA; no LOB; able to stand to urinate in toilet      Balance Overall balance assessment: Needs assistance Sitting-balance support: No upper extremity supported, Feet supported Sitting balance-Leahy Scale: Good     Standing balance support: During functional activity, Bilateral upper extremity supported, Reliant on assistive device for balance Standing balance-Leahy Scale: Fair Standing balance comment: no LOB during mobility using RW                           ADL either performed or assessed with clinical judgement   ADL Overall ADL's : Needs assistance/impaired     Grooming: Wash/dry hands;Supervision/safety;Standing               Lower Body Dressing: Supervision/safety;Sitting/lateral leans Lower Body Dressing Details (indicate cue type and reason): to doff/don socks via figure 4 Toilet Transfer: Supervision/safety   Toileting- Clothing Manipulation and Hygiene: Supervision/safety       Functional mobility  during ADLs: Supervision/safety;Rolling walker (2 wheels)       Vision         Perception         Praxis         Pertinent Vitals/Pain Pain Assessment Pain Assessment: 0-10 Pain Score: 9  Pain Location: posterior neck Pain Descriptors / Indicators: Discomfort, Grimacing Pain Intervention(s): Monitored during session, RN gave pain meds during session,  Limited activity within patient's tolerance, Ice applied, Repositioned     Extremity/Trunk Assessment Upper Extremity Assessment Upper Extremity Assessment: Overall WFL for tasks assessed   Lower Extremity Assessment Lower Extremity Assessment: Overall WFL for tasks assessed   Cervical / Trunk Assessment Cervical / Trunk Assessment: Neck Surgery (denies numbness/tingling)   Communication Communication Communication: No apparent difficulties Cueing Techniques: Verbal cues   Cognition Arousal: Alert Behavior During Therapy: WFL for tasks assessed/performed Overall Cognitive Status: Within Functional Limits for tasks assessed                                 General Comments: A and Ox4; pleasant     General Comments  increased pain to posterior neck during session    Exercises Other Exercises Other Exercises: Edu provided on all cervical precautions and relation during performance of ADLs/transfers and written handout provided to maximize carryover.   Shoulder Instructions      Home Living Family/patient expects to be discharged to:: Private residence Living Arrangements: Spouse/significant other (brother-in-law) Available Help at Discharge: Family;Available PRN/intermittently Type of Home: Mobile home Home Access: Ramped entrance     Home Layout: One level               Home Equipment: Other (comment) (adjustable bed)   Additional Comments: Pt lives with wife & brother-in-law who are both handicap & pt assists them. Reports he has friends that can assist PRN.      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 States his wife said he had 2 falls in the past few weeks but pt does not recall them & is unable to explain why.          OT Problem List:        OT Treatment/Interventions: Self-care/ADL  training;Therapeutic exercise;Therapeutic activities;DME and/or AE instruction;Patient/family education    OT Goals(Current goals can be found in the care plan section) Acute Rehab OT Goals Patient Stated Goal: return home and back to work OT Goal Formulation: With patient Time For Goal Achievement: 10/24/23 Potential to Achieve Goals: Good ADL Goals Pt Will Perform Lower Body Bathing: with modified independence;sitting/lateral leans;sit to/from stand Pt Will Perform Lower Body Dressing: with modified independence;sitting/lateral leans;sit to/from stand;with adaptive equipment Pt Will Transfer to Toilet: with modified independence;ambulating;regular height toilet Pt Will Perform Toileting - Clothing Manipulation and hygiene: with modified independence;sit to/from stand;sitting/lateral leans Additional ADL Goal #1: Pt will verbalize cervical precautions for carryover during ADL/functional transfers.  OT Frequency: Min 1X/week    Co-evaluation              AM-PAC OT "6 Clicks" Daily Activity     Outcome Measure Help from another person eating meals?: None Help from another person taking care of personal grooming?: None Help from another person toileting, which includes using toliet, bedpan, or urinal?: None Help from another person bathing (including washing,  rinsing, drying)?: A Little Help from another person to put on and taking off regular upper body clothing?: None Help from another person to put on and taking off regular lower body clothing?: None 6 Click Score: 23   End of Session Equipment Utilized During Treatment: Rolling walker (2 wheels) Nurse Communication: Mobility status  Activity Tolerance: Patient tolerated treatment well Patient left: in chair;with call bell/phone within reach  OT Visit Diagnosis: Other abnormalities of gait and mobility (R26.89);Pain Pain - part of body:  (neck)                Time: 1610-9604 OT Time Calculation (min): 30 min Charges:  OT  General Charges $OT Visit: 1 Visit OT Evaluation $OT Eval Low Complexity: 1 Low OT Treatments $Self Care/Home Management : 8-22 mins  Kendrea Cerritos, OTR/L 10/10/23, 3:30 PM  Constance Goltz 10/10/2023, 3:27 PM

## 2023-10-10 NOTE — Evaluation (Signed)
Physical Therapy Evaluation Patient Details Name: Marc Aguilar MRN: [id number] DOB: October 31, 1960 Today's Date: 10/10/2023  History of Present Illness  Pt is a 62 y/o M admitted on 10/10/23 for scheduled cervical disc arthroplasty at C4/5 and C5/6. PMH: anxiety, cervical radiculitis, depression, GERD, heart murmur, gout, hiatal hernia, OSA on CPAP, OA, prostate CA, DM2  Clinical Impression  Pt seen for PT evaluation with pt agreeable to tx. Pt reports prior to admission he was independent without AD, driving, working. Pt reports his wife states he had 2 falls recently but pt does not recall these & is unable to explain why. PT educates pt on cervical precautions during session. Pt is able to complete bed mobility with mod I, STS with CGA, & step pivot with RW & CGA. Pt ambulates a few steps forwards & backwards & performs marching in place but further gait deferred 2/2 pt feeling lightheaded, suspect 2/2 pain meds. Will continue to follow pt acutely to progress mobility as able.        If plan is discharge home, recommend the following: A little help with walking and/or transfers;A little help with bathing/dressing/bathroom;Assistance with cooking/housework;Assist for transportation;Help with stairs or ramp for entrance   Can travel by private vehicle        Equipment Recommendations Rolling walker (2 wheels)  Recommendations for Other Services       Functional Status Assessment Patient has had a recent decline in their functional status and demonstrates the ability to make significant improvements in function in a reasonable and predictable amount of time.     Precautions / Restrictions Precautions Precautions: Fall;Cervical Restrictions Weight Bearing Restrictions Per Provider Order: No      Mobility  Bed Mobility Overal bed mobility: Modified Independent Bed Mobility: Supine to Sit     Supine to sit: Modified independent (Device/Increase time), HOB elevated, Used rails           Transfers Overall transfer level: Needs assistance Equipment used: Rolling walker (2 wheels) Transfers: Sit to/from Stand, Bed to chair/wheelchair/BSC Sit to Stand: Contact guard assist   Step pivot transfers: Contact guard assist       General transfer comment: education re: hand placement to push to standing    Ambulation/Gait Ambulation/Gait assistance: Contact guard assist Gait Distance (Feet):  (3 ft forwards + 3 ft backwards) Assistive device: Rolling walker (2 wheels) Gait Pattern/deviations: Decreased step length - right, Decreased stride length, Decreased step length - left Gait velocity: decreased        Stairs            Wheelchair Mobility     Tilt Bed    Modified Rankin (Stroke Patients Only)       Balance Overall balance assessment: Needs assistance Sitting-balance support: No upper extremity supported, Feet supported Sitting balance-Leahy Scale: Fair     Standing balance support: During functional activity, Bilateral upper extremity supported, Reliant on assistive device for balance Standing balance-Leahy Scale: Fair                               Pertinent Vitals/Pain Pain Assessment Pain Assessment: 0-10 Pain Score: 6  Pain Location: posterior neck Pain Descriptors / Indicators: Discomfort, Grimacing Pain Intervention(s): Monitored during session, Premedicated before session, Limited activity within patient's tolerance    Home Living Family/patient expects to be discharged to:: Private residence Living Arrangements: Spouse/significant other (brother-in-law) Available Help at Discharge: Family;Available PRN/intermittently Type of Home: Mobile home  Home Access: Ramped entrance       Home Layout: One level Home Equipment: Other (comment) (adjustable bed) Additional Comments: Pt lives with wife & brother-in-law who are both handicap & pt assists them. Reports he has friends that can assist PRN.    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 States his wife said he had 2 falls in the past few weeks but pt does not recall them & is unable to explain why.       Extremity/Trunk Assessment   Upper Extremity Assessment Upper Extremity Assessment: Overall WFL for tasks assessed    Lower Extremity Assessment Lower Extremity Assessment: Overall WFL for tasks assessed;Generalized weakness    Cervical / Trunk Assessment Cervical / Trunk Assessment: Neck Surgery (denies numbness/tingling)  Communication   Communication Communication: No apparent difficulties  Cognition Arousal: Alert Behavior During Therapy: WFL for tasks assessed/performed Overall Cognitive Status: Within Functional Limits for tasks assessed                                          General Comments General comments (skin integrity, edema, etc.): Pt on room air during session, BP WNL    Exercises Other Exercises Other Exercises: Pt performed standing marching in place with BUE on RW, CGA.   Assessment/Plan    PT Assessment Patient needs continued PT services  PT Problem List Decreased strength;Pain;Decreased range of motion;Decreased activity tolerance;Decreased balance;Decreased mobility;Decreased knowledge of precautions;Decreased safety awareness;Decreased knowledge of use of DME       PT Treatment Interventions Balance training;DME instruction;Modalities;Gait training;Neuromuscular re-education;Stair training;Functional mobility training;Therapeutic activities;Manual techniques;Therapeutic exercise;Patient/family education    PT Goals (Current goals can be found in the Care Plan section)  Acute Rehab PT Goals Patient Stated Goal: decreased pain PT Goal Formulation: With patient Time For Goal Achievement: 10/24/23 Potential to Achieve Goals: Good    Frequency Min 1X/week      Co-evaluation               AM-PAC PT "6 Clicks" Mobility  Outcome Measure Help needed turning from your back to your side while in a flat bed without using bedrails?: None Help needed moving from lying on your back to sitting on the side of a flat bed without using bedrails?: A Little Help needed moving to and from a bed to a chair (including a wheelchair)?: A Little Help needed standing up from a chair using your arms (e.g., wheelchair or bedside chair)?: A Little Help needed to walk in hospital room?: A Little Help needed climbing 3-5 steps with a railing? : A Little 6 Click Score: 19    End of Session   Activity Tolerance: Other (comment) (limited by dizziness/lightheadedness suspect 2/2 pain meds) Patient left: in chair;with nursing/sitter in room Nurse Communication: Mobility status PT Visit Diagnosis: Unsteadiness on feet (R26.81);Pain;Muscle weakness (generalized) (M62.81);Difficulty in walking, not elsewhere classified (R26.2) Pain - part of body:  (neck)    Time: 0102-7253 PT Time Calculation (min) (ACUTE ONLY): 15 min   Charges:   PT Evaluation $PT Eval Low Complexity: 1 Low   PT General Charges $$ ACUTE PT VISIT: 1 Visit         Aleda Grana, PT, DPT 10/10/23, 1:37 PM   Sandi Mariscal 10/10/2023, 1:35  PM

## 2023-10-10 NOTE — Anesthesia Preprocedure Evaluation (Signed)
Anesthesia Evaluation  Patient identified by MRN, date of birth, ID band Patient awake    Reviewed: Allergy & Precautions, H&P , NPO status , Patient's Chart, lab work & pertinent test results, reviewed documented beta blocker date and time   Airway Mallampati: II  TM Distance: >3 FB Neck ROM: full    Dental  (+) Teeth Intact   Pulmonary sleep apnea and Continuous Positive Airway Pressure Ventilation , former smoker   Pulmonary exam normal        Cardiovascular Exercise Tolerance: Good hypertension, Pt. on medications Normal cardiovascular exam+ Valvular Problems/Murmurs  Rhythm:regular Rate:Normal     Neuro/Psych  Headaches PSYCHIATRIC DISORDERS Anxiety Depression    Right upper extr weakness reported at baseline. ja  Neuromuscular disease    GI/Hepatic Neg liver ROS, hiatal hernia,GERD  Medicated,,  Endo/Other  negative endocrine ROSdiabetes, Well Controlled    Renal/GU negative Renal ROS  negative genitourinary   Musculoskeletal   Abdominal   Peds  Hematology negative hematology ROS (+)   Anesthesia Other Findings Past Medical History: No date: Anxiety No date: Arthritis     Comment:  "knees and hips" (02/28/2018) No date: Cervical radiculitis No date: Chicken pox No date: Depression No date: GERD (gastroesophageal reflux disease) No date: Heart murmur No date: Hernia, abdominal No date: High cholesterol No date: History of gout No date: History of hiatal hernia No date: Hyperlipidemia associated with type 2 diabetes mellitus (HCC) No date: Hypertension No date: Migraine     Comment:  "probably monthly" (02/28/2018) No date: Motion sickness     Comment:  ocean boats, circular motion No date: Neck pain No date: OSA on CPAP No date: Osteoarthritis 2021: Prostate cancer (HCC)     Comment:  surgery and radiation treatments No date: Refusal of blood transfusions as patient is Jehovah's Witness No date:  Seasonal allergies No date: Type II diabetes mellitus (HCC) Past Surgical History: 02/28/2018: CARDIAC CATHETERIZATION 03/17/2023: ESOPHAGOGASTRODUODENOSCOPY (EGD) WITH PROPOFOL; N/A     Comment:  Procedure: ESOPHAGOGASTRODUODENOSCOPY (EGD) WITH               PROPOFOL;  Surgeon: [name], MD;  Location: [facility]               SURGERY CNTR;  Service: Endoscopy;  Laterality: N/A;                Diabetic 04/18/2023: ESOPHAGOGASTRODUODENOSCOPY (EGD) WITH PROPOFOL; N/A     Comment:  Procedure: ESOPHAGOGASTRODUODENOSCOPY (EGD) WITH               PROPOFOL;  Surgeon: [name], MD;  Location: [facility]               SURGERY CNTR;  Service: Endoscopy;  Laterality: N/A; 02/28/2018: LEFT HEART CATH AND CORONARY ANGIOGRAPHY; N/A     Comment:  Procedure: LEFT HEART CATH AND CORONARY ANGIOGRAPHY;                Surgeon: Tonny Bollman, MD;  Location: [facility] INVASIVE CV               LAB;  Service: Cardiovascular;  Laterality: N/A; 11/19/2019: ROBOT ASSISTED LAPAROSCOPIC RADICAL PROSTATECTOMY; N/A     Comment:  Procedure: XI ROBOTIC ASSISTED LAPAROSCOPIC RADICAL               PROSTATECTOMY WITH LYMPH NODE DISSECTION;  Surgeon:               Vanna Scotland, MD;  Location: [facility] ORS;  Service:  Urology;  Laterality: N/A; 05/21/2022: SHOULDER ARTHROSCOPY WITH ROTATOR CUFF REPAIR AND  SUBACROMIAL DECOMPRESSION; Right     Comment:  Procedure: Right shoulder arthroscopic rotator cuff               repair (supraspinatus & subscapularis), subacromial               decompression, and biceps tenodesis;  Surgeon: Signa Kell, MD;  Location: [facility] ORS;  Service: Orthopedics;                Laterality: Right; No date: VASECTOMY 1984: WRIST GANGLION EXCISION; Left   Reproductive/Obstetrics negative OB ROS                             Anesthesia Physical Anesthesia Plan  ASA: 3  Anesthesia Plan: General ETT   Post-op Pain Management:    Induction:    PONV Risk Score and Plan: 3  Airway Management Planned:   Additional Equipment:   Intra-op Plan:   Post-operative Plan:   Informed Consent: I have reviewed the patients History and Physical, chart, labs and discussed the procedure including the risks, benefits and alternatives for the proposed anesthesia with the patient or authorized representative who has indicated his/her understanding and acceptance.     Dental Advisory Given  Plan Discussed with: CRNA  Anesthesia Plan Comments:        Anesthesia Quick Evaluation

## 2023-10-11 ENCOUNTER — Other Ambulatory Visit: Payer: Self-pay

## 2023-10-11 DIAGNOSIS — Z8546 Personal history of malignant neoplasm of prostate: Secondary | ICD-10-CM | POA: Diagnosis not present

## 2023-10-11 DIAGNOSIS — G4733 Obstructive sleep apnea (adult) (pediatric): Secondary | ICD-10-CM | POA: Diagnosis not present

## 2023-10-11 DIAGNOSIS — E119 Type 2 diabetes mellitus without complications: Secondary | ICD-10-CM | POA: Diagnosis not present

## 2023-10-11 DIAGNOSIS — Z79899 Other long term (current) drug therapy: Secondary | ICD-10-CM | POA: Diagnosis not present

## 2023-10-11 DIAGNOSIS — Z87891 Personal history of nicotine dependence: Secondary | ICD-10-CM | POA: Diagnosis not present

## 2023-10-11 DIAGNOSIS — M4802 Spinal stenosis, cervical region: Secondary | ICD-10-CM | POA: Diagnosis not present

## 2023-10-11 DIAGNOSIS — I10 Essential (primary) hypertension: Secondary | ICD-10-CM | POA: Diagnosis not present

## 2023-10-11 DIAGNOSIS — M50121 Cervical disc disorder at C4-C5 level with radiculopathy: Secondary | ICD-10-CM | POA: Diagnosis not present

## 2023-10-11 DIAGNOSIS — Z7982 Long term (current) use of aspirin: Secondary | ICD-10-CM | POA: Diagnosis not present

## 2023-10-11 DIAGNOSIS — M50122 Cervical disc disorder at C5-C6 level with radiculopathy: Secondary | ICD-10-CM | POA: Diagnosis not present

## 2023-10-11 LAB — GLUCOSE, CAPILLARY
Glucose-Capillary: 198 mg/dL — ABNORMAL HIGH (ref 70–99)
Glucose-Capillary: 226 mg/dL — ABNORMAL HIGH (ref 70–99)

## 2023-10-11 MED ORDER — PANTOPRAZOLE SODIUM 40 MG PO TBEC
DELAYED_RELEASE_TABLET | ORAL | Status: AC
  Filled 2023-10-11: qty 1

## 2023-10-11 MED ORDER — OXYCODONE HCL 5 MG PO TABS
5.0000 mg | ORAL_TABLET | ORAL | 0 refills | Status: DC | PRN
  Filled 2023-10-11: qty 30, 5d supply, fill #0

## 2023-10-11 MED ORDER — ACETAMINOPHEN 500 MG PO TABS
ORAL_TABLET | ORAL | Status: AC
  Filled 2023-10-11: qty 2

## 2023-10-11 MED ORDER — OXYCODONE HCL 5 MG PO TABS: 5.0000 mg | ORAL_TABLET | ORAL | 0 refills | Status: DC | PRN

## 2023-10-11 MED ORDER — SODIUM CHLORIDE 0.9% FLUSH: 3.0000 mL | Freq: Two times a day (BID) | INTRAVENOUS | Status: DC

## 2023-10-11 MED ORDER — AMLODIPINE BESYLATE 5 MG PO TABS
ORAL_TABLET | ORAL | Status: AC
  Filled 2023-10-11: qty 2

## 2023-10-11 MED ORDER — METHOCARBAMOL 500 MG PO TABS
500.0000 mg | ORAL_TABLET | Freq: Three times a day (TID) | ORAL | 0 refills | Status: DC | PRN
  Filled 2023-10-11: qty 30, 10d supply, fill #0

## 2023-10-11 MED ORDER — SODIUM CHLORIDE 0.9% FLUSH
3.0000 mL | INTRAVENOUS | Status: DC | PRN
Start: 2023-10-11 — End: 2023-10-11

## 2023-10-11 MED ORDER — INSULIN ASPART 100 UNIT/ML IJ SOLN
INTRAMUSCULAR | Status: AC
  Filled 2023-10-11: qty 1

## 2023-10-11 MED ORDER — OXYCODONE HCL 5 MG PO TABS
ORAL_TABLET | ORAL | Status: AC
  Filled 2023-10-11: qty 1

## 2023-10-11 MED ORDER — CELECOXIB 200 MG PO CAPS
200.0000 mg | ORAL_CAPSULE | Freq: Two times a day (BID) | ORAL | 0 refills | Status: DC
  Filled 2023-10-11: qty 28, 14d supply, fill #0

## 2023-10-11 MED ORDER — EZETIMIBE 10 MG PO TABS
ORAL_TABLET | ORAL | Status: AC
Start: 2023-10-11 — End: ?
  Filled 2023-10-11: qty 1

## 2023-10-11 MED ORDER — CELECOXIB 200 MG PO CAPS
ORAL_CAPSULE | ORAL | Status: AC
Start: 2023-10-11 — End: ?
  Filled 2023-10-11: qty 1

## 2023-10-11 NOTE — Progress Notes (Signed)
Physical Therapy Treatment Patient Details Name: Marc Aguilar MRN: [id number] DOB: 1961/06/28 Today's Date: 10/11/2023   History of Present Illness Pt is a 62 y/o M admitted on 10/10/23 for scheduled cervical disc arthroplasty at C4/5 and C5/6. PMH: anxiety, cervical radiculitis, depression, GERD, heart murmur, gout, hiatal hernia, OSA on CPAP, OA, prostate CA, DM2    PT Comments  Pt in bed.  Gets up with ease with log rolling technique.  Is able to walk x 1 lap on unit with RW and cga x 1 with some unsteadiness and R lean but no interventions needed.  Remained up in chair after.  Returned later in AM once medicated and he is able to walk x 2 laps with RW and generally improved balance.  Pt continues to have a very stiff gait and encouraged to relax shoulders and arms and keep head in a more neutral position as he does seem to keep head a bit extended.  Discussed safety at home and using pain medications as prescribed to decrease pain, improve gait and reduce risk of falls.  Pt stated he felt comfortable with mobility and discharge home.  Pt will have friend take him home and he is encouraged to have her guard while accessing home on ramp.  Voiced understanding.    If plan is discharge home, recommend the following: A little help with walking and/or transfers;A little help with bathing/dressing/bathroom;Assistance with cooking/housework;Assist for transportation;Help with stairs or ramp for entrance   Can travel by private vehicle        Equipment Recommendations  Rolling walker (2 wheels)    Recommendations for Other Services       Precautions / Restrictions Precautions Precautions: Fall;Cervical Restrictions Weight Bearing Restrictions Per Provider Order: No     Mobility  Bed Mobility Overal bed mobility: Modified Independent               Patient Response: Cooperative  Transfers Overall transfer level: Needs assistance Equipment used: Rolling walker (2  wheels) Transfers: Sit to/from Stand Sit to Stand: Supervision                Ambulation/Gait Ambulation/Gait assistance: Contact guard assist, Supervision Gait Distance (Feet): 400 Feet Assistive device: Rolling walker (2 wheels) Gait Pattern/deviations: Decreased step length - right, Decreased stride length, Decreased step length - left Gait velocity: decreased     General Gait Details: 200' x 1 then returned after pain meds 400' x 1   Stairs             Wheelchair Mobility     Tilt Bed Tilt Bed Patient Response: Cooperative  Modified Rankin (Stroke Patients Only)       Balance Overall balance assessment: Needs assistance Sitting-balance support: No upper extremity supported, Feet supported Sitting balance-Leahy Scale: Good     Standing balance support: During functional activity, Bilateral upper extremity supported, Reliant on assistive device for balance Standing balance-Leahy Scale: Fair Standing balance comment: no LOB during mobility using RW                            Cognition Arousal: Alert Behavior During Therapy: WFL for tasks assessed/performed Overall Cognitive Status: Within Functional Limits for tasks assessed                                          Exercises  General Comments        Pertinent Vitals/Pain Pain Assessment Pain Assessment: 0-10 Pain Score: 2  Pain Location: posterior neck Pain Descriptors / Indicators: Discomfort, Grimacing Pain Intervention(s): Monitored during session, Premedicated before session, Limited activity within patient's tolerance    Home Living                          Prior Function            PT Goals (current goals can now be found in the care plan section) Progress towards PT goals: Progressing toward goals    Frequency    Min 1X/week      PT Plan      Co-evaluation              AM-PAC PT "6 Clicks" Mobility   Outcome  Measure  Help needed turning from your back to your side while in a flat bed without using bedrails?: None Help needed moving from lying on your back to sitting on the side of a flat bed without using bedrails?: None Help needed moving to and from a bed to a chair (including a wheelchair)?: A Little Help needed standing up from a chair using your arms (e.g., wheelchair or bedside chair)?: A Little Help needed to walk in hospital room?: A Little Help needed climbing 3-5 steps with a railing? : A Little 6 Click Score: 20    End of Session Equipment Utilized During Treatment: Gait belt Activity Tolerance: Patient tolerated treatment well Patient left: in chair;with call bell/phone within reach Nurse Communication: Mobility status PT Visit Diagnosis: Unsteadiness on feet (R26.81);Pain;Muscle weakness (generalized) (M62.81);Difficulty in walking, not elsewhere classified (R26.2)     Time: 1610-9604 PT Time Calculation (min) (ACUTE ONLY): 18 min  Charges:    $Gait Training: 8-22 mins PT General Charges $$ ACUTE PT VISIT: 1 Visit                   Danielle Dess, PTA 10/11/23, 9:56 AM

## 2023-10-11 NOTE — Plan of Care (Signed)
  Problem: Clinical Measurements: Goal: Ability to maintain clinical measurements within normal limits will improve Outcome: Progressing   Problem: Activity: Goal: Risk for activity intolerance will decrease Outcome: Progressing   Problem: Coping: Goal: Level of anxiety will decrease Outcome: Progressing   Problem: Elimination: Goal: Will not experience complications related to urinary retention Outcome: Progressing   Problem: Activity: Goal: Ability to avoid complications of mobility impairment will improve Outcome: Progressing Goal: Ability to tolerate increased activity will improve Outcome: Progressing   Problem: Pain Management: Goal: Pain level will decrease Outcome: Progressing

## 2023-10-11 NOTE — Discharge Instructions (Signed)
Your surgeon has performed an operation on your cervical spine (neck) to relieve pressure on the spinal cord and/or nerves. This involved making an incision in the front of your neck and removing one or more of the discs that support your spine. Next, a small piece of bone, a titanium plate, and screws were used to fuse two or more of the vertebrae (bones) together.  The following are instructions to help in your recovery once you have been discharged from the hospital. Even if you feel well, it is important that you follow these activity guidelines. If you do not let your neck heal properly from the surgery, you can increase the chance of return of your symptoms and other complications.  Celebrex, if prescribed, is ok to take.  Do not resume your aspirin until after you stop celebrex.  Activity    No bending, lifting, or twisting ("BLT"). Avoid lifting objects heavier than 10 pounds (gallon milk jug).  Where possible, avoid household activities that involve lifting, bending, reaching, pushing, or pulling such as laundry, vacuuming, grocery shopping, and childcare. Try to arrange for help from friends and family for these activities while your back heals.  Increase physical activity slowly as tolerated.  Taking short walks is encouraged, but avoid strenuous exercise. Do not jog, run, bicycle, lift weights, or participate in any other exercises unless specifically allowed by your doctor.  Talk to your doctor before resuming sexual activity.  You should not drive until cleared by your doctor.  Until released by your doctor, you should not return to work or school.  You should rest at home and let your body heal.   You may shower three days after your surgery.  After showering, lightly dab your incision dry. Do not take a tub bath or go swimming until approved by your doctor at your follow-up appointment.  If your doctor ordered a cervical collar (neck brace) for you, you should wear it whenever  you are out of bed. You may remove it when lying down or sleeping, but you should wear it at all other times. Not all neck surgeries require a cervical collar.  If you smoke, we strongly recommend that you quit.  Smoking has been proven to interfere with normal bone healing and will dramatically reduce the success rate of your surgery. Please contact [facility] (800-QUIT-NOW) and use the resources at www.[facility].com for assistance in stopping smoking.  Surgical Incision   If you have a dressing on your incision, you may remove it two days after your surgery. Keep your incision area clean and dry.  If you have staples or stitches on your incision, you should have a follow up scheduled for removal. If you do not have staples or stitches, you will have steri-strips (small pieces of surgical tape) or Dermabond glue. The steri-strips/glue should begin to peel away within about a week (it is fine if the steri-strips fall off before then). If the strips are still in place one week after your surgery, you may gently remove them.  Diet           You may return to your usual diet. However, you may experience discomfort when swallowing in the first month after your surgery. This is normal. You may find that softer foods are more comfortable for you to swallow. Be sure to stay hydrated.  When to Contact us  You may experience pain in your neck and/or pain between your shoulder blades. This is normal and should improve in the next  few weeks with the help of pain medication, muscle relaxers, and rest. Some patients report that a warm compress on the back of the neck or between the shoulder blades helps.  However, should you experience any of the following, contact us immediately: New numbness or weakness Pain that is progressively getting worse, and is not relieved by your pain medication, muscle relaxers, rest, and warm compresses Bleeding, redness, swelling, pain, or drainage from surgical  incision Chills or flu-like symptoms Fever greater than 101.0 F (38.3 C) Inability to eat, drink fluids, or take medications Problems with bowel or bladder functions Difficulty breathing or shortness of breath Warmth, tenderness, or swelling in your calf Contact Information How to contact us:  If you have any questions/concerns before or after surgery, you can reach [place] at [phone], or you can send a mychart message. We can be reached by phone or mychart 8am-4pm, Monday-Friday.  *Please note: Calls after 4pm are forwarded to a third party answering service. Mychart messages are not routinely monitored during evenings, weekends, and holidays. Please call our office to contact the answering service for urgent concerns during non-business hours.

## 2023-10-11 NOTE — Discharge Summary (Signed)
Physician Discharge Summary  Patient ID: Marc Aguilar MRN: [id number] DOB/AGE: 60-Aug-1962 60 y.o.  Admit date: 10/10/2023 Discharge date: 10/11/2023  Admission Diagnoses: Principal Problem:   Cervical radiculopathy Active Problems:   Spinal stenosis in cervical region  Discharge Diagnoses:  Principal Problem:   Cervical radiculopathy Active Problems:   Spinal stenosis in cervical region   Discharged Condition: good  Hospital Course: Marc Aguilar was admitted for elective surgical intervention.  He did well and was stable for discharge on POD1.  Consults: None  Significant Diagnostic Studies: radiology: X-Ray: correct placement of implants  Treatments: surgery: cervical arthroplasty C4-6  Discharge Exam: Blood pressure 118/66, pulse 83, temperature 97.8 F (36.6 C), temperature source Temporal, resp. rate 16, SpO2 95%. General appearance: alert and cooperative CNI MAEW 5/5   Disposition: Discharge disposition: 01-Home or Self Care       Discharge Instructions     Discharge patient   Complete by: As directed    Discharge disposition: 01-Home or Self Care   Discharge patient date: 10/11/2023   Incentive spirometry RT   Complete by: As directed       Allergies as of 10/11/2023       Reactions   Tape Rash   Paper tape-blisters        Medication List     TAKE these medications    Alcohol Prep Pads 1 each by Does not apply route 2 (two) times daily.   amLODipine 10 MG tablet Commonly known as: NORVASC Take 1 tablet (10 mg total) by mouth daily.   aspirin 325 MG tablet Take 325 mg by mouth daily.   atorvastatin 20 MG tablet Commonly known as: LIPITOR Take 1 tablet (20 mg total) by mouth daily. What changed: when to take this   buPROPion 300 MG 24 hr tablet Commonly known as: Wellbutrin XL Take 1 tablet (300 mg total) by mouth daily.   celecoxib 200 MG capsule Commonly known as: CELEBREX Take 1 capsule (200 mg total) by mouth every 12  (twelve) hours.   citalopram 40 MG tablet Commonly known as: CELEXA Take 1 tablet (40 mg total) by mouth daily.   ezetimibe 10 MG tablet Commonly known as: Zetia Take 1 tablet (10 mg total) by mouth daily.   FreeStyle Freedom Lite w/Device Kit Use as directed to check blood sugar once daily.   freestyle lancets Use as directed to check blood sugar once daily.   FREESTYLE LITE test strip Generic drug: glucose blood Use to check blood sugar once a day.   glipiZIDE 10 MG 24 hr tablet Commonly known as: GLUCOTROL XL Take 1 tablet (10 mg total) by mouth daily with breakfast.   methocarbamol 500 MG tablet Commonly known as: ROBAXIN Take 1 tablet (500 mg total) by mouth every 8 (eight) hours as needed for muscle spasms.   Mounjaro 2.5 MG/0.5ML Pen Generic drug: tirzepatide Inject 2.5 mg into the skin once a week.   oxyCODONE 5 MG immediate release tablet Commonly known as: Oxy IR/ROXICODONE Take 1 tablet (5 mg total) by mouth every 4 (four) hours as needed for moderate pain (pain score 4-6).   pantoprazole 40 MG tablet Commonly known as: PROTONIX Take 1 tablet (40 mg total) by mouth 2 (two) times daily before a meal.   tadalafil 20 MG tablet Commonly known as: CIALIS Take 1 tablet (20 mg total) by mouth daily as needed for erectile dysfunction.   traZODone 100 MG tablet Commonly known as: DESYREL Take 2 tablets (200 mg total)  by mouth at bedtime.        Follow-up Information     Marc Borders, PA Follow up on 10/25/2023.   Specialty: Neurosurgery Why: 1 pm Contact information: [address] [place] [address] [phone]                 Signed: Venetia Night 10/11/2023, 9:10 AM

## 2023-10-11 NOTE — Progress Notes (Signed)
   Neurosurgery Progress Note  History: Marc Aguilar is here for cervical radiculopathy  POD1: Doing well.  Sensation improved  Physical Exam: Vitals:   10/11/23 0100 10/11/23 0548  BP: 117/68 118/66  Pulse: 89 83  Resp: 16 16  Temp: (!) 97.4 F (36.3 C) 97.8 F (36.6 C)  SpO2: 96% 95%    AA Ox3 CNI  Strength:5/5 throughout BUE Neck soft and incision c/d/i  Data:  Other tests/results: n/a  Assessment/Plan:  Marc Aguilar is doing well  - mobilize - pain control - DVT prophylaxis - PTOT   Venetia Night MD, [facility] Department of Neurosurgery

## 2023-10-11 NOTE — Progress Notes (Signed)
DISCHARGE NOTE:  Pt discharged with IV removed and dc education given. Pt voices no further questions or concerns at this time. Pt has both TED hose on and in place. Pt walker delivered at bedside. Pt wheeled down to medical mall entrance by staff and pt's wife provided transportation.

## 2023-10-11 NOTE — Plan of Care (Signed)

## 2023-10-18 NOTE — Anesthesia Postprocedure Evaluation (Signed)
 Anesthesia Post Note  Patient: Marc Aguilar  Procedure(s) Performed: C4-6 ARTHROPLASTY (Spine Cervical)  Patient location during evaluation: PACU Anesthesia Type: General Level of consciousness: awake and alert Pain management: pain level controlled Vital Signs Assessment: post-procedure vital signs reviewed and stable Respiratory status: spontaneous breathing, nonlabored ventilation, respiratory function stable and patient connected to nasal cannula oxygen Cardiovascular status: blood pressure returned to baseline and stable Postop Assessment: no apparent nausea or vomiting Anesthetic complications: no   No notable events documented.   Last Vitals:  Vitals:   10/11/23 0100 10/11/23 0548  BP: 117/68 118/66  Pulse: 89 83  Resp: 16 16  Temp: (!) 36.3 C 36.6 C  SpO2: 96% 95%    Last Pain:  Vitals:   10/11/23 0928  TempSrc:   PainSc: 2                  Lynwood KANDICE Clause

## 2023-10-25 ENCOUNTER — Encounter: Payer: Self-pay | Admitting: Neurosurgery

## 2023-10-25 ENCOUNTER — Other Ambulatory Visit: Payer: Self-pay

## 2023-10-25 ENCOUNTER — Ambulatory Visit: Payer: Commercial Managed Care - PPO | Admitting: Neurosurgery

## 2023-10-25 ENCOUNTER — Encounter: Payer: Self-pay | Admitting: Internal Medicine

## 2023-10-25 VITALS — BP 124/78 | Ht 71.0 in | Wt 217.0 lb

## 2023-10-25 DIAGNOSIS — Z09 Encounter for follow-up examination after completed treatment for conditions other than malignant neoplasm: Secondary | ICD-10-CM

## 2023-10-25 DIAGNOSIS — M5412 Radiculopathy, cervical region: Secondary | ICD-10-CM

## 2023-10-25 MED ORDER — METHOCARBAMOL 500 MG PO TABS
500.0000 mg | ORAL_TABLET | Freq: Three times a day (TID) | ORAL | 0 refills | Status: DC | PRN
  Filled 2023-10-25: qty 30, 10d supply, fill #0

## 2023-10-25 MED ORDER — TIRZEPATIDE 5 MG/0.5ML ~~LOC~~ SOAJ
5.0000 mg | SUBCUTANEOUS | 0 refills | Status: DC
  Filled 2023-10-25 – 2023-10-26 (×2): qty 2, 28d supply, fill #0
  Filled 2023-11-22: qty 2, 28d supply, fill #1
  Filled 2023-12-17: qty 2, 28d supply, fill #2

## 2023-10-25 MED ORDER — CELECOXIB 200 MG PO CAPS
200.0000 mg | ORAL_CAPSULE | Freq: Two times a day (BID) | ORAL | 0 refills | Status: DC
  Filled 2023-10-25: qty 28, 14d supply, fill #0

## 2023-10-25 NOTE — Progress Notes (Signed)
   REFERRING PHYSICIAN:  Antonette Angeline LELON Carrolyn [address] [place],  [place] [address]  DOS: 10/10/23 C4-6 arthroplasty  HISTORY OF PRESENT ILLNESS: Marc Aguilar is about 2 weeks status post cervical arthroplasty. Overall, he is doing well and feels as though his symptoms are better than they were before surgery.  He does however continue to have some pain in the back of his neck which does improve with Celebrex  and Robaxin  as well as some intermittent numbness and tingling in his hands particularly when he lays down at night.  He denies any radiating arm pain or incisional concerns.  His swallowing has improved although he still has some hoarseness in his voice.  PHYSICAL EXAMINATION:  NEUROLOGICAL:  General: In no acute distress.   Awake, alert, oriented to person, place, and time.  Pupils equal round and reactive to light.  Facial tone is symmetric.  Tongue protrusion is midline.  There is no pronator drift.  Strength: Side Biceps Triceps Deltoid Interossei Grip Wrist Ext. Wrist Flex.  R 5 5 5 5 5 5 5   L 5 5 5 5 5 5 5    Incision c/d/I and healing well  Imaging:  No interval imaging to review  Assessment / Plan: Marc Aguilar is doing well after arthroplasty. We discussed activity escalation and I have advised the patient to lift up to 10 pounds until 6 weeks after surgery, then increase up to 25 pounds until 12 weeks after surgery.  After 12 weeks post-op, the patient advised to increase activity as tolerated.  I have provided him with refills of both his Celebrex  and Robaxin  at his request.  I recommended given the physical nature of his job that he continue to remain out of work until his follow-up with Dr. Clois when we can reevaluate this.  He will send his FMLA paperwork to our office.  he will return to clinic in approximately 4 weeks to see Dr. Clois or sooner should he have any questions or concerns.  He expressed understanding and was in agreement with this plan.    Advised  to contact the office if any questions or concerns arise.   Edsel Goods PA-C Dept of Neurosurgery

## 2023-10-25 NOTE — Progress Notes (Signed)
 PA started  [employer] (Key: [id number]) Rx #: [id number] Need Help? Call us at [phone] Status sent iconSent to Plan today Drug Mounjaro 5MG /0.5ML auto-injectors ePA cloud logo Form MedImpact ePA Form 2017 NCPDP Original Claim Info 75

## 2023-10-26 ENCOUNTER — Telehealth: Payer: Self-pay

## 2023-10-26 ENCOUNTER — Other Ambulatory Visit: Payer: Self-pay

## 2023-10-26 NOTE — Telephone Encounter (Signed)
 Vinie Roughen (Key: [facility]) PA Case ID #: [id number] Rx #: [id number] Need Help? Call us  at [phone] Outcome Approved today by MedImpact 2017 The request has been approved. The authorization is effective from 10/25/2023 to 10/25/2024, as long as the member is enrolled in their current health plan. This has been approved for a max daily dosage of 0.071. A written notification letter will follow with additional details. Authorization Expiration Date: 10/24/2024 Drug Mounjaro  5MG /0.5ML auto-injectors ePA cloud logo Form MedImpact ePA Form 2017 NCPDP Original Claim Info 75

## 2023-10-31 ENCOUNTER — Encounter: Payer: Self-pay | Admitting: Neurosurgery

## 2023-11-10 ENCOUNTER — Encounter: Payer: Self-pay | Admitting: Radiation Oncology

## 2023-11-10 ENCOUNTER — Ambulatory Visit: Payer: Self-pay | Admitting: Radiation Oncology

## 2023-11-10 ENCOUNTER — Ambulatory Visit
Admission: RE | Admit: 2023-11-10 | Discharge: 2023-11-10 | Disposition: A | Discharge status: Home / Self Care | Payer: Commercial Managed Care - PPO | Source: Ambulatory Visit | Attending: Radiation Oncology | Admitting: Radiation Oncology

## 2023-11-10 VITALS — BP 139/93 | HR 92 | Temp 97.0°F | Resp 16 | Ht 71.0 in | Wt 215.0 lb

## 2023-11-10 DIAGNOSIS — C61 Malignant neoplasm of prostate: Secondary | ICD-10-CM | POA: Insufficient documentation

## 2023-11-10 DIAGNOSIS — G4733 Obstructive sleep apnea (adult) (pediatric): Secondary | ICD-10-CM | POA: Diagnosis not present

## 2023-11-10 NOTE — Progress Notes (Signed)
Radiation Oncology Follow up Note  Name: Marc Aguilar   Date:   11/10/2023 MRN:  [id number] DOB: 07/14/1961    This 63 y.o. male presents to the clinic today for 1 month follow-up status post.  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  REFERRING PROVIDER: Lorre Munroe, NP  HPI: Patient is a 63 year old male now out 1 month having completed salvage radiation therapy status post prostatectomy for Gleason 7 (4+3) adenocarcinoma.  Seen today in follow-up he is doing well.  He specifically denies any increased lower urinary tract symptoms diarrhea or fatigue..  COMPLICATIONS OF TREATMENT: none  FOLLOW UP COMPLIANCE: keeps appointments   PHYSICAL EXAM:  BP (!) 139/93   Pulse 92   Temp (!) 97 F (36.1 C) (Tympanic)   Resp 16   Ht 5\' 11"  (1.803 m)   Wt 215 lb (97.5 kg)   BMI 29.99 kg/m  Well-developed well-nourished patient in NAD. HEENT reveals PERLA, EOMI, discs not visualized.  Oral cavity is clear. No oral mucosal lesions are identified. Neck is clear without evidence of cervical or supraclavicular adenopathy. Lungs are clear to A&P. Cardiac examination is essentially unremarkable with regular rate and rhythm without murmur rub or thrill. Abdomen is benign with no organomegaly or masses noted. Motor sensory and DTR levels are equal and symmetric in the upper and lower extremities. Cranial nerves II through XII are grossly intact. Proprioception is intact. No peripheral adenopathy or edema is identified. No motor or sensory levels are noted. Crude visual fields are within normal range.  RADIOLOGY RESULTS: No current films to review  PLAN: Present time patient is doing well with low side effect profile status post salvage radiation therapy for prostate cancer.  Pleased with overall progress.  Of asked to see him back in 3 months  for follow-up with a repeat PSA.  Patient knows to call with any concerns.  I would like to take this opportunity to thank you for allowing me to participate in the care of your patient.[name], MD

## 2023-11-18 ENCOUNTER — Encounter: Payer: Self-pay | Admitting: Internal Medicine

## 2023-11-21 ENCOUNTER — Telehealth: Payer: Self-pay

## 2023-11-21 ENCOUNTER — Ambulatory Visit: Payer: Self-pay | Admitting: *Deleted

## 2023-11-21 NOTE — Telephone Encounter (Signed)
  Chief Complaint: nose bleed, elevated BP Symptoms: nosebleeds- 3-4 days, elevated BP, slight headache Frequency: started 3-4 days- nose bleeding off/on- heavy at times Pertinent Negatives: Patient denies lightheadedness  Disposition: [] ED /[] Urgent Care (no appt availability in office) / [x] Appointment(In office/virtual)/ []  [facility]/ [] Home Care/ [] Refused Recommended Disposition /[] [facility]/ []  Follow-up with PCP Additional Notes: Patient has been schedule office visit for evaluation of BP. Patient states he took 2 BP pills last night - 144/89 today. Patient advised to monitor symptoms- call back with changes- or be seen if he gets worse before appointment in am.

## 2023-11-21 NOTE — Telephone Encounter (Signed)
Yes, this needs to be a separate appt

## 2023-11-21 NOTE — Telephone Encounter (Signed)
Summary: nosebleeds/ bp up and down but not in danger zone   Pt called in says has nosebleeds everytime he blows his nose, and headache was yesterday, bp is down today. I dont have an appt until Mar 10.         Reason for Disposition  Hard-to-stop nosebleeds are a chronic symptom (recurrent or ongoing AND present > 4 weeks)  Answer Assessment - Initial Assessment Questions 1. AMOUNT OF BLEEDING: "How bad is the bleeding?" "How much blood was lost?" "Has the bleeding stopped?"   - MILD: needed a couple tissues   - MODERATE: needed many tissues   - SEVERE: large blood clots, soaked many tissues, lasted more than 30 minutes      Moderate/severe 2. ONSET: "When did the nosebleed start?"      3-4 days 3. FREQUENCY: "How many nosebleeds have you had in the last 24 hours?"      1 4. RECURRENT SYMPTOMS: "Have there been other recent nosebleeds?" If Yes, ask: "How long did it take you to stop the bleeding?" "What worked best?"      Yes- over 2 hour, pressure,laying down 5. CAUSE: "What do you think caused this nosebleed?"     Not sure- elevated BP- 150/101, 151/104, took medication amlodipine 2 tablets- 136/93, 125/85, headache 6. LOCAL FACTORS: "Do you have any cold symptoms?", "Have you been rubbing or picking at your nose?"     no 7. SYSTEMIC FACTORS: "Do you have high blood pressure or any bleeding problems?"     BP 8. BLOOD THINNERS: "Do you take any blood thinners?" (e.g., aspirin, clopidogrel / Plavix, coumadin, heparin). Notes: Other strong blood thinners include: Arixtra (fondaparinux), Eliquis (apixaban), Pradaxa (dabigatran), and Xarelto (rivaroxaban).     no 9. OTHER SYMPTOMS: "Do you have any other symptoms?" (e.g., lightheadedness)     headache  Protocols used: Nosebleed-A-AH

## 2023-11-21 NOTE — Telephone Encounter (Signed)
Copied from CRM [id number]. Topic: Appointment Scheduling - Scheduling Inquiry for Clinic >> Nov 21, 2023 11:33 AM Turkey B wrote: Reason for CRM: pt says received call about coming in for disability paperwork to be done. He want to know if /Dr Baitywants him to come in sooner than 03/10 that he already has scheduled for something else

## 2023-11-22 ENCOUNTER — Other Ambulatory Visit: Payer: Self-pay

## 2023-11-22 ENCOUNTER — Ambulatory Visit
Admission: RE | Admit: 2023-11-22 | Discharge: 2023-11-22 | Disposition: A | Discharge status: Home / Self Care | Payer: Commercial Managed Care - PPO | Source: Ambulatory Visit | Attending: Neurosurgery | Admitting: Neurosurgery

## 2023-11-22 ENCOUNTER — Ambulatory Visit (INDEPENDENT_AMBULATORY_CARE_PROVIDER_SITE_OTHER): Payer: Commercial Managed Care - PPO | Admitting: Neurosurgery

## 2023-11-22 ENCOUNTER — Ambulatory Visit: Payer: Commercial Managed Care - PPO | Admitting: Internal Medicine

## 2023-11-22 ENCOUNTER — Other Ambulatory Visit: Payer: Self-pay | Admitting: Neurosurgery

## 2023-11-22 ENCOUNTER — Encounter: Payer: Self-pay | Admitting: Internal Medicine

## 2023-11-22 VITALS — BP 138/82 | Ht 71.0 in | Wt 213.0 lb

## 2023-11-22 VITALS — BP 124/78 | Temp 99.0°F | Ht 71.0 in | Wt 213.0 lb

## 2023-11-22 DIAGNOSIS — R040 Epistaxis: Secondary | ICD-10-CM | POA: Diagnosis not present

## 2023-11-22 DIAGNOSIS — I10 Essential (primary) hypertension: Secondary | ICD-10-CM

## 2023-11-22 DIAGNOSIS — Z0289 Encounter for other administrative examinations: Secondary | ICD-10-CM

## 2023-11-22 DIAGNOSIS — M150 Primary generalized (osteo)arthritis: Secondary | ICD-10-CM | POA: Diagnosis not present

## 2023-11-22 DIAGNOSIS — Z981 Arthrodesis status: Secondary | ICD-10-CM | POA: Diagnosis not present

## 2023-11-22 DIAGNOSIS — M5412 Radiculopathy, cervical region: Secondary | ICD-10-CM

## 2023-11-22 DIAGNOSIS — J3489 Other specified disorders of nose and nasal sinuses: Secondary | ICD-10-CM | POA: Diagnosis not present

## 2023-11-22 DIAGNOSIS — Z09 Encounter for follow-up examination after completed treatment for conditions other than malignant neoplasm: Secondary | ICD-10-CM

## 2023-11-22 DIAGNOSIS — M5416 Radiculopathy, lumbar region: Secondary | ICD-10-CM

## 2023-11-22 MED ORDER — MUPIROCIN 2 % EX OINT
1.0000 | TOPICAL_OINTMENT | Freq: Two times a day (BID) | CUTANEOUS | 0 refills | Status: DC
  Filled 2023-11-22: qty 22, 11d supply, fill #0

## 2023-11-22 MED ORDER — CELECOXIB 200 MG PO CAPS
200.0000 mg | ORAL_CAPSULE | Freq: Two times a day (BID) | ORAL | 0 refills | Status: DC
  Filled 2023-11-22: qty 28, 14d supply, fill #0

## 2023-11-22 MED ORDER — METHOCARBAMOL 500 MG PO TABS
500.0000 mg | ORAL_TABLET | Freq: Three times a day (TID) | ORAL | 0 refills | Status: DC | PRN
  Filled 2023-11-22: qty 30, 10d supply, fill #0

## 2023-11-22 NOTE — Patient Instructions (Signed)
 Joint Pain  Joint pain can be caused by many things. It may go away if you follow instructions from your health care provider for taking care of yourself at home. Sometimes, you may need more treatment. Joint pain can be caused by: Bruises at the area of the joint. An injury caused by movements that are repeated. Wear and tear on the joint as you get older. Buildup of uric acid crystals in the joint. This is also called gout. Irritation and swelling of the joint. Types of arthritis. Infections of the joint or of the bone. Your provider may tell you to take pain medicine or wear an elastic bandage, sling, or splint. If your joint pain continues, you may need lab or imaging tests to find the cause of your joint pain. Follow these instructions at home: If you have an elastic bandage, sling, or splint that can be taken off: Wear the bandage, sling, or splint as told by your provider. Take it off only if your provider says you can. Check the skin under and around it every day. Tell your provider if you see problems. Loosen it if your fingers or toes tingle, are numb, or turn cold and blue. Keep it clean and dry. Ask your provider if you should remove it before bathing. If the bandage, sling, or splint is not waterproof: Do not let it get wet. Cover it when you take a bath or shower. Use a cover that does not let any water in. Managing pain, stiffness, and swelling     If told, put ice on the area. If you have an elastic bandage, sling, or splint that you can take off, remove it as told. Put ice in a plastic bag. Place a towel between your skin and the bag. Leave the ice on for 20 minutes, 2-3 times a day. If told, put heat on the area. Do this as often as told. Use the heat source that your provider recommends, such as a moist heat pack or a heating pad. Place a towel between your skin and the heat source. Leave the heat on for 20-30 minutes. If your skin turns bright red, take off the  ice or heat right away to prevent skin damage. The risk of damage is higher if you can't feel pain, heat, or cold. Move your fingers or toes often to reduce stiffness and swelling. Raise the injured area above the level of your heart while you're sitting or lying down. Use a pillow to support the painful area as needed. Activity Rest the painful joint as told. Do not do things that cause pain or make pain worse. Begin exercising or stretching the affected area as told by your provider. Return to normal activities when you are told. Ask what things are safe for you to do. General instructions Take your medicines as told by your provider. Treatment may include medicines for pain and swelling that are taken by mouth or applied to the skin. Do not smoke, vape, or use products with nicotine or tobacco in them. If you need help quitting, talk with your provider. Keep all follow-up visits. Your provider will want to check on your condition. Contact a health care provider if: You have pain that does not get better with medicine. Your joint pain does not improve within 3 days. You have more bruising or swelling. You have a fever. You lose 10 lb (4.5 kg) or more without trying. Get help right away if: You cannot move the joint. Your fingers  or toes tingle, become numb, or turn cold and blue. You have a fever along with a joint that's red, warm, and swollen. This information is not intended to replace advice given to you by your health care provider. Make sure you discuss any questions you have with your health care provider. Document Revised: 07/07/2023 Document Reviewed: 12/17/2022 Elsevier Patient Education  2024 [employer].

## 2023-11-22 NOTE — Progress Notes (Signed)
   REFERRING PHYSICIAN:  Antonette Angeline LELON Carrolyn [address] [place],  [place] [address]  DOS: 10/10/23 C4-6 arthroplasty  HISTORY OF PRESENT ILLNESS: Marc Aguilar is status post cervical arthroplasty. Overall, he is doing well.  His symptoms have improved.  He still having some neck discomfort.  His swallowing has improved.  He has some hoarseness in his voice.    He has been having pain down his right leg particularly with walking for the past 6 to 8 months.  This bothers him when he stands or walks.  It is always on his right leg down the lateral and posterior aspect of his thigh to his anterolateral calf.  He denies weakness.   PHYSICAL EXAMINATION:  NEUROLOGICAL:  General: In no acute distress.   Awake, alert, oriented to person, place, and time.  Pupils equal round and reactive to light.  Facial tone is symmetric.  Tongue protrusion is midline.  There is no pronator drift.  Strength: Side Biceps Triceps Deltoid Interossei Grip Wrist Ext. Wrist Flex.  R 5 5 5 5 5 5 5   L 5 5 5 5 5 5 5   5  out of 5 strength in his bilateral lower extremities.  Positive straight leg raise on the right.  Sensation intact to light touch  Incision c/d/I and healing well  Imaging:  No complications noted-there has been some settling but is within normal parameters  Assessment / Plan: DEANGLO HISSONG is doing well after arthroplasty.   I will start him on physical therapy for his neck.  I will send him to physical therapy for his back as well.  He has been having symptoms for more than 6 months.  At this point, I feel that a lumbar spine MRI is indicated.  Will determine further interventions based on the MRI results.  I feel that his symptoms are consistent with a right L5 radiculopathy.  He is ready to return to work.  Will give him some work restrictions but return to work next week.  We discussed disability application.  I do not think he is at his maximal medical improvement at this point.  Reeves Daisy MD Dept of Neurosurgery

## 2023-11-22 NOTE — Progress Notes (Signed)
 Subjective:    Patient ID: Marc Aguilar, male    DOB: 12/23/1960, 63 y.o.   MRN: [id number]  HPI  Discussed the use of AI scribe software for clinical note transcription with the patient, who gave verbal consent to proceed.   Marc Aguilar is a 63 year old male with hypertension who presents with nosebleeds and elevated blood pressure.  He has been experiencing epistaxis for the past three days, prompting him to check his blood pressure, which was 155/104 mmHg. In response, he took two 10 mg doses of amlodipine , which gradually reduced his blood pressure, though it remained elevated overnight, and his nose continued to bleed. Both nostrils were bleeding, with the right side being particularly dry and sore. He denies using nasal sprays or antihistamines and is currently taking 81 mg of aspirin  daily. He experiences headaches and ear pain but denies a sore throat. He also mentions nausea, which he attributes to blood loss from the epistaxis. No runny nose, nasal congestion, or respiratory symptoms.  He has a history of neck surgery where two discs were replaced without fusion. Post-surgery, he reports improvement in neck pain but continues to experience numbness in both hands, which has not improved. The numbness sometimes extends to his arms  and hands when lying down.  He describes chronic right hip pain that worsens with activity and is accompanied by numbness extending to the knee. The pain is sore and achy, intensifying with prolonged activity such as driving or walking. He has not had any back or hip surgeries and has not undergone an MRI for his lumbar spine or hip. He recalls an x-ray from September 2024 showing mild arthritic changes in the hip and a lumbar spine x-ray from December 2016 showing mild facet arthritic changes at L5-S1.  He reports difficulty with attention and concentration, estimating that he is off task about 20% of the time at work. He can maintain attention for less  than 15 minutes and estimates being absent from work about one day per month due to his medical conditions. He experiences fatigue and pain, which affect his ability to perform tasks. He reports pain in his head, neck, shoulders, arms, and numbness in his hands, which disrupts his sleep.  He is requesting disability form completion today.      Review of Systems     Past Medical History:  Diagnosis Date   Anxiety    Arthritis    knees and hips (02/28/2018)   Cervical radiculitis    Chicken pox    Depression    GERD (gastroesophageal reflux disease)    Heart murmur    Hernia, abdominal    High cholesterol    History of gout    History of hiatal hernia    Hyperlipidemia associated with type 2 diabetes mellitus (HCC)    Hypertension    Migraine    probably monthly (02/28/2018)   Motion sickness    ocean boats, circular motion   Neck pain    OSA on CPAP    Osteoarthritis    Prostate cancer (HCC) 2021   surgery and radiation treatments   Refusal of blood transfusions as patient is Jehovah's Witness    Seasonal allergies    Type II diabetes mellitus (HCC)     Current Outpatient Medications  Medication Sig Dispense Refill   Alcohol  Swabs (ALCOHOL  PREP) PADS 1 each by Does not apply route 2 (two) times daily. 200 each 3   amLODipine  (NORVASC ) 10 MG tablet  Take 1 tablet (10 mg total) by mouth daily. 90 tablet 0   aspirin  325 MG tablet Take 325 mg by mouth daily.     atorvastatin  (LIPITOR ) 20 MG tablet Take 1 tablet (20 mg total) by mouth daily. (Patient taking differently: Take 20 mg by mouth at bedtime.) 90 tablet 0   Blood Glucose Monitoring Suppl (BLOOD GLUCOSE MONITOR SYSTEM) w/Device KIT Use as directed to check blood sugar once daily. 1 kit 0   buPROPion  (WELLBUTRIN  XL) 300 MG 24 hr tablet Take 1 tablet (300 mg total) by mouth daily. 90 tablet 0   celecoxib  (CELEBREX ) 200 MG capsule Take 1 capsule (200 mg total) by mouth every 12 (twelve) hours. 28 capsule 0   citalopram   (CELEXA ) 40 MG tablet Take 1 tablet (40 mg total) by mouth daily. (Patient not taking: Reported on 10/25/2023) 90 tablet 0   ezetimibe  (ZETIA ) 10 MG tablet Take 1 tablet (10 mg total) by mouth daily. 90 tablet 0   glipiZIDE  (GLUCOTROL  XL) 10 MG 24 hr tablet Take 1 tablet (10 mg total) by mouth daily with breakfast. 90 tablet 1   Glucose Blood (BLOOD GLUCOSE TEST STRIPS) STRP Use to check blood sugar once a day. 100 strip 1   Lancets (FREESTYLE) lancets Use as directed to check blood sugar once daily. 100 each 1   methocarbamol  (ROBAXIN ) 500 MG tablet Take 1 tablet (500 mg total) by mouth every 8 (eight) hours as needed for muscle spasms. 30 tablet 0   oxyCODONE  (OXY IR/ROXICODONE ) 5 MG immediate release tablet Take 1 tablet (5 mg total) by mouth every 4 (four) hours as needed for moderate pain (pain score 4-6). 30 tablet 0   pantoprazole  (PROTONIX ) 40 MG tablet Take 1 tablet (40 mg total) by mouth 2 (two) times daily before a meal. 60 tablet 5   tadalafil  (CIALIS ) 20 MG tablet Take 1 tablet (20 mg total) by mouth daily as needed for erectile dysfunction. 10 tablet 6   tirzepatide  (MOUNJARO ) 5 MG/0.5ML Pen Inject 5 mg into the skin once a week. 6 mL 0   traZODone  (DESYREL ) 100 MG tablet Take 2 tablets (200 mg total) by mouth at bedtime. 180 tablet 1   No current facility-administered medications for this visit.    Allergies  Allergen Reactions   Tape Rash    Paper tape-blisters    Family History  Problem Relation Age of Onset   Diabetes Mother    Hyperlipidemia Mother    Bladder Cancer Father    Liver cancer Sister    Diabetes Sister    Diabetes Maternal Aunt    Arthritis Maternal Grandmother    Diabetes Maternal Grandmother    Arthritis Maternal Grandfather    Arthritis Paternal Grandmother    Diabetes Brother    Heart disease Brother    Colon cancer Neg Hx     Social History   Socioeconomic History   Marital status: Married    Spouse name: Nena   Number of children: 2    Years of education: Not on file   Highest education level: Bachelor's degree (e.g., BA, AB, BS)  Occupational History   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  Tobacco Use   Smoking status: Former    Current packs/day: 0.00    Average packs/day: 1 pack/day for 27.0 years (27.0 ttl pk-yrs)    Types: Cigarettes    Start date: 10/18/1982    Quit date: 10/18/2009    Years since quitting: 14.1   Smokeless tobacco: Former  Types: [place]    Quit date: 10/18/1994  Vaping Use   Vaping status: Never Used  Substance and Sexual Activity   Alcohol  use: Yes    Alcohol /week: 21.0 standard drinks of alcohol     Types: 21 Cans of beer per week    Comment: 6 beers daily   Drug use: Never   Sexual activity: Not on file  Other Topics Concern   Not on file  Social History Narrative   Not on file   Social Drivers of Health   Financial Resource Strain: Low Risk  (03/28/2023)   Overall Financial Resource Strain (CARDIA)    Difficulty of Paying Living Expenses: Not hard at all  Food Insecurity: No Food Insecurity (10/10/2023)   Hunger Vital Sign    Worried About Running Out of Food in the Last Year: Never true    Ran Out of Food in the Last Year: Never true  Transportation Needs: No Transportation Needs (10/10/2023)   PRAPARE - Administrator, Civil Service (Medical): No    Lack of Transportation (Non-Medical): No  Physical Activity: Sufficiently Active (03/28/2023)   Exercise Vital Sign    Days of Exercise per Week: 1 day    Minutes of Exercise per Session: 150+ min  Stress: Stress Concern Present (03/28/2023)   [employer] of Occupational Health - Occupational Stress Questionnaire    Feeling of Stress : Very much  Social Connections: Moderately Isolated (03/28/2023)   Social Connection and Isolation Panel [NHANES]    Frequency of Communication with Friends and Family: Once a week    Frequency of Social Gatherings with Friends and Family: Never    Attends Religious Services: More than 4 times per  year    Active Member of [employer] or Organizations: No    Attends Banker Meetings: Not on file    Marital Status: Married  Catering Manager Violence: Not At Risk (10/10/2023)   Humiliation, Afraid, Rape, and Kick questionnaire    Fear of Current or Ex-Partner: No    Emotionally Abused: No    Physically Abused: No    Sexually Abused: No     Constitutional: Patient reports intermittent headaches.  Denies fever, malaise, fatigue, or abrupt weight changes.  HEENT: Patient reports ear pain, nosebleeds.  Denies eye pain, eye redness, ear pain, ringing in the ears, wax buildup, runny nose, nasal congestion, or sore throat. Respiratory: Denies difficulty breathing, shortness of breath, cough or sputum production.   Cardiovascular: Denies chest pain, chest tightness, palpitations or swelling in the hands or feet.  Gastrointestinal: Pt reports nausea. Denies abdominal pain, bloating, constipation, diarrhea or blood in the stool.  GU: Patient reports erectile dysfunction.  Denies urgency, frequency, pain with urination, burning sensation, blood in urine, odor or discharge. Musculoskeletal: Patient reports chronic neck,  bilateral shoulder pain, low back pain, right hip pain and bilateral knee pain.  Denies decrease in range of motion, difficulty with gait, muscle pain or joint swelling.  Skin: Denies redness, rashes, lesions or ulcercations.  Neurological: Patient reports insomnia, paresthesias of upper extremities and right lower extremity.  Denies dizziness, difficulty with memory, difficulty with speech or problems with balance and coordination.  Psych: Patient has a history of anxiety and depression.  Denies SI/HI.  No other specific complaints in a complete review of systems (except as listed in HPI above).  Objective:   Physical Exam  BP 138/82 (BP Location: Left Arm, Patient Position: Sitting, Cuff Size: Large)   Ht 5' 11 (1.803 m)  Wt 213 lb (96.6 kg)   BMI 29.71 kg/m      Wt Readings from Last 3 Encounters:  11/10/23 215 lb (97.5 kg)  10/25/23 217 lb (98.4 kg)  09/27/23 217 lb 6.4 oz (98.6 kg)    General: Appears his stated age, obese, in NAD. Skin: Warm, dry and intact. No ulcerations noted. HEENT: Head: normal shape and size; Eyes: sclera white, no icterus, conjunctiva pink, PERRLA and EOMs intact; Nose: Bilateral turbinate swollen, right nare with evidence of ulceration and caked blood. Neck: No adenopathy noted. Cardiovascular: Normal rate and rhythm. S1,S2 noted.  No murmur, rubs or gallops noted. No JVD or BLE edema.  Pulmonary/Chest: Normal effort and positive vesicular breath sounds. No respiratory distress. No wheezes, rales or ronchi noted.  Abdomen: Soft and nontender. Normal bowel sounds.  Musculoskeletal: Normal flexion, extension, rotation and lateral bending of the cervical spine.  He has pain with rotation to the right of the cervical spine.  No bony tenderness noted over the cervical spine.  Shoulder shrug equal but weak bilaterally.  He has decreased external rotation bilateral shoulders but normal internal rotation.  Strength 4/5 BUE.  Handgrips equal.  Normal flexion, extension, rotation and lateral bending of the lumbar spine.  Bony tenderness noted over lumbar spine.  He has decreased resistance bilaterally with hip flexion.  Decreased internal and external rotation of the right hip.  Normal internal and external rotation of the left hip.  Normal flexion and extension of bilateral knees with joint enlargement and crepitus noted with range of motion.  No joint swelling noted of the knees.  Strength 5/5 BLE.  He is able to stand on tiptoes but has difficulty standing on his heels.  He has some difficulty getting from a sitting to a standing position.  Gait slow and steady today with without use of assistive device. Neurological: Alert and oriented. Cranial nerves II-XII grossly intact. Coordination normal.  Psychiatric: Mood and affect  normal. Behavior is normal. Judgment and thought content normal.     BMET    Component Value Date/Time   NA 136 09/27/2023 0949   K 4.3 09/27/2023 0949   CL 99 09/27/2023 0949   CO2 26 09/27/2023 0949   GLUCOSE 255 (H) 09/27/2023 0949   BUN 19 09/27/2023 0949   CREATININE 0.97 09/27/2023 0949   CREATININE 0.86 06/28/2023 1525   CALCIUM  9.5 09/27/2023 0949   GFRNONAA >60 09/27/2023 0949   GFRAA >60 11/20/2019 0445    Lipid Panel     Component Value Date/Time   CHOL 216 (H) 09/27/2023 1126   CHOL 157 10/26/2019 1203   TRIG 209 (H) 09/27/2023 1126   HDL 56 09/27/2023 1126   HDL 48 10/26/2019 1203   CHOLHDL 3.9 09/27/2023 1126   VLDL 60.2 (H) 01/01/2021 1047   LDLCALC 125 (H) 09/27/2023 1126    CBC    Component Value Date/Time   WBC 2.9 (L) 10/03/2023 0812   WBC 2.6 (L) 09/27/2023 0949   RBC 4.38 10/03/2023 0812   HGB 11.7 (L) 10/03/2023 0812   HCT 34.9 (L) 10/03/2023 0812   PLT 226 10/03/2023 0812   MCV 79.7 (L) 10/03/2023 0812   MCH 26.7 10/03/2023 0812   MCHC 33.5 10/03/2023 0812   RDW 14.5 10/03/2023 0812   LYMPHSABS 1.9 05/06/2023 2049   MONOABS 0.5 05/06/2023 2049   EOSABS 0.1 05/06/2023 2049   BASOSABS 0.0 05/06/2023 2049    Hgb A1C Lab Results  Component Value Date   HGBA1C 8.3 (  A) 09/27/2023           Assessment & Plan:   Assessment and Plan    Hypertension Recent episode of elevated blood pressure at home (155/104) associated with nosebleeds. Patient self-adjusted Amlodipine  dose. Blood pressure controlled in office today (138/82). -Continue Amlodipine  10mg  daily. -Advise against self-adjusting medication dose. -Plan to verify accuracy of home blood pressure monitor at next visit.  Epistaxis Recent nosebleeds, possibly related to elevated blood pressure and/or dry nasal passages. No recent changes in anticoagulant therapy (81mg  Aspirin  daily). -Start nasal saline four times daily. -Prescribe Mupirocin  for nasal application to  moisturize and heal sores. -Advise use of humidifier at home.  Cervical Disc Replacement Persistent neck pain and numbness in hands post-surgery. Some improvement in symptoms but not resolved. -Continue current management plan -Follow-up with neurosurgery as previously prescribed.  Chronic Low Back Pain, Right Hip Pain, Paresthesia of Right Lower Extremity, Bilateral Knee Pain: Chronic right hip pain with radiating numbness to the knee. Mild arthritic changes noted on previous imaging. -Prior x-ray of right hip and lumbar spine reviewed -I recommend he have a MRI of the lumbar spine and right hip, he would prefer to discuss this with orthopedics -Plan to consult with orthopedist -Disability paperwork will be filled out and faxed back per his request     RTC in 2 months, follow-up chronic conditions Angeline Laura, NP

## 2023-11-24 ENCOUNTER — Ambulatory Visit
Admission: RE | Admit: 2023-11-24 | Discharge: 2023-11-24 | Disposition: A | Discharge status: Home / Self Care | Payer: Commercial Managed Care - PPO | Source: Ambulatory Visit | Attending: Neurosurgery | Admitting: Neurosurgery

## 2023-11-24 ENCOUNTER — Ambulatory Visit: Payer: Commercial Managed Care - PPO | Admitting: Internal Medicine

## 2023-11-24 DIAGNOSIS — M5416 Radiculopathy, lumbar region: Secondary | ICD-10-CM | POA: Diagnosis not present

## 2023-11-24 DIAGNOSIS — M47817 Spondylosis without myelopathy or radiculopathy, lumbosacral region: Secondary | ICD-10-CM | POA: Diagnosis not present

## 2023-11-24 DIAGNOSIS — M5136 Other intervertebral disc degeneration, lumbar region with discogenic back pain only: Secondary | ICD-10-CM | POA: Diagnosis not present

## 2023-11-24 DIAGNOSIS — M48061 Spinal stenosis, lumbar region without neurogenic claudication: Secondary | ICD-10-CM | POA: Diagnosis not present

## 2023-12-02 ENCOUNTER — Encounter: Payer: Self-pay | Admitting: Neurosurgery

## 2023-12-13 ENCOUNTER — Encounter: Payer: Self-pay | Admitting: Neurosurgery

## 2023-12-17 ENCOUNTER — Other Ambulatory Visit: Payer: Self-pay | Admitting: Physician Assistant

## 2023-12-17 ENCOUNTER — Other Ambulatory Visit: Payer: Self-pay | Admitting: Internal Medicine

## 2023-12-17 MED FILL — Glipizide Tab ER 24HR 10 MG: ORAL | 90 days supply | Qty: 90 | Fill #1 | Status: AC

## 2023-12-18 ENCOUNTER — Other Ambulatory Visit: Payer: Self-pay | Admitting: Physician Assistant

## 2023-12-18 ENCOUNTER — Other Ambulatory Visit: Payer: Self-pay | Admitting: Internal Medicine

## 2023-12-18 ENCOUNTER — Other Ambulatory Visit: Payer: Self-pay

## 2023-12-19 ENCOUNTER — Other Ambulatory Visit: Payer: Self-pay

## 2023-12-19 MED FILL — Methocarbamol Tab 500 MG: ORAL | 10 days supply | Qty: 30 | Fill #0 | Status: AC

## 2023-12-19 MED FILL — Celecoxib Cap 200 MG: ORAL | 14 days supply | Qty: 28 | Fill #0 | Status: AC

## 2023-12-20 ENCOUNTER — Other Ambulatory Visit: Payer: Self-pay

## 2023-12-20 MED FILL — Bupropion HCl Tab ER 24HR 300 MG: ORAL | 90 days supply | Qty: 90 | Fill #0 | Status: AC

## 2023-12-20 MED FILL — Ezetimibe Tab 10 MG: ORAL | 90 days supply | Qty: 90 | Fill #0 | Status: AC

## 2023-12-20 MED FILL — Citalopram Hydrobromide Tab 40 MG (Base Equiv): ORAL | 90 days supply | Qty: 90 | Fill #0 | Status: AC

## 2023-12-20 MED FILL — Amlodipine Besylate Tab 10 MG (Base Equivalent): ORAL | 90 days supply | Qty: 90 | Fill #0 | Status: AC

## 2023-12-20 NOTE — Telephone Encounter (Signed)
 Requested Prescriptions  Pending Prescriptions Disp Refills   amLODipine (NORVASC) 10 MG tablet 90 tablet 0    Sig: Take 1 tablet (10 mg total) by mouth daily.     Cardiovascular: Calcium Channel Blockers 2 Passed - 12/20/2023  9:49 AM      Passed - Last BP in normal range    BP Readings from Last 1 Encounters:  11/22/23 124/78         Passed - Last Heart Rate in normal range    Pulse Readings from Last 1 Encounters:  11/10/23 92         Passed - Valid encounter within last 6 months    Recent Outpatient Visits           2 months ago Type 2 diabetes mellitus with hyperglycemia, without long-term current use of insulin [facility])   [place] [facility] [place], Salvadore Oxford, NP   4 months ago Cervical radiculitis   [place] [facility] [place], [place], NP   5 months ago Type 2 diabetes mellitus with hyperglycemia, without long-term current use of insulin [facility])   [place] [facility] [place], Salvadore Oxford, NP   8 months ago Encounter for general adult medical examination with abnormal findings   [place], Salvadore Oxford, NP   1 year ago Chronic nausea   [place] [facility] [place] , Salvadore Oxford, NP       Future Appointments             In 6 days [place], Salvadore Oxford, NP [place] [facility]   In 1 week Vanna Scotland, MD [facility] [place]             buPROPion (WELLBUTRIN XL) 300 MG 24 hr tablet 90 tablet 0    Sig: Take 1 tablet (300 mg total) by mouth daily.     Psychiatry: Antidepressants - bupropion Passed - 12/20/2023  9:49 AM      Passed - Cr in normal range and within 360 days    Creat  Date Value Ref Range Status  06/28/2023 0.86 0.70 - 1.35 mg/dL Final   Creatinine, Ser  Date Value Ref Range Status  09/27/2023 0.97 0.61 - 1.24 mg/dL Final   Creatinine,U  Date Value Ref Range Status  11/14/2018 183.0 mg/dL Final   Creatinine, Urine  Date  Value Ref Range Status  06/28/2023 91 20 - 320 mg/dL Final         Passed - AST in normal range and within 360 days    AST  Date Value Ref Range Status  06/28/2023 22 10 - 35 U/L Final         Passed - ALT in normal range and within 360 days    ALT  Date Value Ref Range Status  06/28/2023 28 9 - 46 U/L Final         Passed - Completed PHQ-2 or PHQ-9 in the last 360 days      Passed - Last BP in normal range    BP Readings from Last 1 Encounters:  11/22/23 124/78         Passed - Valid encounter within last 6 months    Recent Outpatient Visits           2 months ago Type 2 diabetes mellitus with hyperglycemia, without long-term current use of insulin [facility])   [place] [address]  [facility] [place], Salvadore Oxford, NP   4 months ago Cervical radiculitis   [place] [facility] [place], [place], NP   5 months ago Type 2 diabetes mellitus with hyperglycemia, without long-term current use of insulin [facility])   [place] [facility] [place], Salvadore Oxford, NP   8 months ago Encounter for general adult medical examination with abnormal findings   [place] [facility] [place], Salvadore Oxford, NP   1 year ago Chronic nausea   [place] [facility] [place], Salvadore Oxford, NP       Future Appointments             In 6 days [place] Si, Salvadore Oxford, NP [place] [facility], [place]   In 1 week Vanna Scotland, MD [facility] [place]             citalopram (CELEXA) 40 MG tablet 90 tablet 0    Sig: Take 1 tablet (40 mg total) by mouth daily.     Psychiatry:  Antidepressants - SSRI Passed - 12/20/2023  9:49 AM      Passed - Completed PHQ-2 or PHQ-9 in the last 360 days      Passed - Valid encounter within last 6 months    Recent Outpatient Visits           2 months ago Type 2 diabetes mellitus with hyperglycemia, without long-term current use of insulin [facility])   [place] [facility] [place],  Salvadore Oxford, NP   4 months ago Cervical radiculitis   [place] [facility] [place], [place], NP   5 months ago Type 2 diabetes mellitus with hyperglycemia, without long-term current use of insulin [facility])   [place] [facility] [place], Salvadore Oxford, NP   8 months ago Encounter for general adult medical examination with abnormal findings   [place] [facility] [place], Salvadore Oxford, NP   1 year ago Chronic nausea   [place] [facility] [place], Salvadore Oxford, NP       Future Appointments             In 6 days [place] Si, Salvadore Oxford, NP  [facility]   In 1 week Vanna Scotland, MD [facility] [place]             ezetimibe (ZETIA) 10 MG tablet 90 tablet 0    Sig: Take 1 tablet (10 mg total) by mouth daily.     Cardiovascular:  Antilipid - Sterol Transport Inhibitors Failed - 12/20/2023  9:49 AM      Failed - Lipid Panel in normal range within the last 12 months    Cholesterol, Total  Date Value Ref Range Status  10/26/2019 157 100 - 199 mg/dL Final   Cholesterol  Date Value Ref Range Status  09/27/2023 216 (H) <200 mg/dL Final   LDL Cholesterol (Calc)  Date Value Ref Range Status  09/27/2023 125 (H) mg/dL (calc) Final    Comment:    Reference range: <100 . Desirable range <100 mg/dL for primary prevention;   <70 mg/dL for patients with CHD or diabetic patients  with > or = 2 CHD risk factors. Marland Kitchen LDL-C is now calculated using the Martin-Hopkins  calculation, which is a validated novel method providing  better accuracy than the Friedewald equation in the  estimation of LDL-C.  Horald Pollen et  al. JAMA. 1610;960(45): 2061-2068  (http://education.Quest[employer].com/faq/FAQ164)    Direct LDL  Date Value Ref Range Status  01/01/2021 127.0 mg/dL Final    Comment:    Optimal:  <100 mg/dLNear or Above Optimal:  100-129 mg/dLBorderline High:  130-159 mg/dLHigh:  160-189 mg/dLVery High:   >190 mg/dL   HDL  Date Value Ref Range Status  09/27/2023 56 > OR = 40 mg/dL Final  40/98/1191 48 >47 mg/dL Final   Triglycerides  Date Value Ref Range Status  09/27/2023 209 (H) <150 mg/dL Final    Comment:    . If a non-fasting specimen was collected, consider repeat triglyceride testing on a fasting specimen if clinically indicated.  Perry Mount et al. J. of Clin. Lipidol. 2015;9:129-169. Marland Kitchen          Passed - AST in normal range and within 360 days    AST  Date Value Ref Range Status  06/28/2023 22 10 - 35 U/L Final         Passed - ALT in normal range and within 360 days    ALT  Date Value Ref Range Status  06/28/2023 28 9 - 46 U/L Final         Passed - Patient is not pregnant      Passed - Valid encounter within last 12 months    Recent Outpatient Visits           2 months ago Type 2 diabetes mellitus with hyperglycemia, without long-term current use of insulin [facility])   [place] [facility] [place], Salvadore Oxford, NP   4 months ago Cervical radiculitis   [place] [facility] [place], [place], NP   5 months ago Type 2 diabetes mellitus with hyperglycemia, without long-term current use of insulin [facility])   [place] [facility] [place], Salvadore Oxford, NP   8 months ago Encounter for general adult medical examination with abnormal findings   [place] [facility] [place], Salvadore Oxford, NP   1 year ago Chronic nausea   [place] [facility] [place], Salvadore Oxford, NP       Future Appointments             In 6 days [place], Salvadore Oxford, NP [place] [facility]   In 1 week Vanna Scotland, MD [facility]

## 2023-12-23 ENCOUNTER — Other Ambulatory Visit: Payer: Self-pay

## 2023-12-26 ENCOUNTER — Ambulatory Visit: Payer: Self-pay | Admitting: Internal Medicine

## 2023-12-26 ENCOUNTER — Other Ambulatory Visit

## 2023-12-26 ENCOUNTER — Ambulatory Visit: Payer: Commercial Managed Care - PPO | Admitting: Internal Medicine

## 2023-12-26 VITALS — BP 118/68 | Ht 71.0 in | Wt 218.0 lb

## 2023-12-26 DIAGNOSIS — K219 Gastro-esophageal reflux disease without esophagitis: Secondary | ICD-10-CM

## 2023-12-26 DIAGNOSIS — E1169 Type 2 diabetes mellitus with other specified complication: Secondary | ICD-10-CM

## 2023-12-26 DIAGNOSIS — I10 Essential (primary) hypertension: Secondary | ICD-10-CM

## 2023-12-26 DIAGNOSIS — D509 Iron deficiency anemia, unspecified: Secondary | ICD-10-CM | POA: Insufficient documentation

## 2023-12-26 DIAGNOSIS — N522 Drug-induced erectile dysfunction: Secondary | ICD-10-CM

## 2023-12-26 DIAGNOSIS — E1165 Type 2 diabetes mellitus with hyperglycemia: Secondary | ICD-10-CM | POA: Diagnosis not present

## 2023-12-26 DIAGNOSIS — E785 Hyperlipidemia, unspecified: Secondary | ICD-10-CM

## 2023-12-26 DIAGNOSIS — D72829 Elevated white blood cell count, unspecified: Secondary | ICD-10-CM | POA: Insufficient documentation

## 2023-12-26 DIAGNOSIS — G43C1 Periodic headache syndromes in child or adult, intractable: Secondary | ICD-10-CM

## 2023-12-26 DIAGNOSIS — M150 Primary generalized (osteo)arthritis: Secondary | ICD-10-CM

## 2023-12-26 DIAGNOSIS — C61 Malignant neoplasm of prostate: Secondary | ICD-10-CM | POA: Diagnosis not present

## 2023-12-26 DIAGNOSIS — G4701 Insomnia due to medical condition: Secondary | ICD-10-CM

## 2023-12-26 DIAGNOSIS — Z8546 Personal history of malignant neoplasm of prostate: Secondary | ICD-10-CM

## 2023-12-26 DIAGNOSIS — E66811 Obesity, class 1: Secondary | ICD-10-CM

## 2023-12-26 DIAGNOSIS — D500 Iron deficiency anemia secondary to blood loss (chronic): Secondary | ICD-10-CM

## 2023-12-26 DIAGNOSIS — N529 Male erectile dysfunction, unspecified: Secondary | ICD-10-CM | POA: Insufficient documentation

## 2023-12-26 DIAGNOSIS — Z7985 Long-term (current) use of injectable non-insulin antidiabetic drugs: Secondary | ICD-10-CM

## 2023-12-26 DIAGNOSIS — M5412 Radiculopathy, cervical region: Secondary | ICD-10-CM

## 2023-12-26 DIAGNOSIS — F32A Depression, unspecified: Secondary | ICD-10-CM

## 2023-12-26 DIAGNOSIS — G4733 Obstructive sleep apnea (adult) (pediatric): Secondary | ICD-10-CM | POA: Diagnosis not present

## 2023-12-26 DIAGNOSIS — E119 Type 2 diabetes mellitus without complications: Secondary | ICD-10-CM | POA: Diagnosis not present

## 2023-12-26 DIAGNOSIS — F419 Anxiety disorder, unspecified: Secondary | ICD-10-CM

## 2023-12-26 MED ORDER — PANTOPRAZOLE SODIUM 40 MG PO TBEC: 40.0000 mg | DELAYED_RELEASE_TABLET | Freq: Every day | ORAL | Status: DC

## 2023-12-26 NOTE — Assessment & Plan Note (Signed)
 Try to avoid triggers Continue ibuprofen or tylenol as needed

## 2023-12-26 NOTE — Assessment & Plan Note (Signed)
Continue ibuprofen and tylenol He will continue to follow with neurosurgery

## 2023-12-26 NOTE — Assessment & Plan Note (Signed)
 Try to identify and avoid triggers Encourage weight loss as this can help reduce reflux symptoms Will decrease pantoprazole to 40 mg daily

## 2023-12-26 NOTE — Assessment & Plan Note (Signed)
Stable on citalopram and bupropion Support offered

## 2023-12-26 NOTE — Assessment & Plan Note (Signed)
 Encouraged diet and exercise for weight loss ?

## 2023-12-26 NOTE — Assessment & Plan Note (Signed)
Encourage weight loss as this can help reduce sleep apnea symptoms Continue CPAP 

## 2023-12-26 NOTE — Assessment & Plan Note (Addendum)
 A1c today urine microalbumin has been checked within the last year Encouraged low carb diet and exercise for weight loss Continue glipizide and Mounjaro Encouraged routine eye exam, request copy Encouraged routine foot exam Flu shot UTD Will give him pneumonia vaccine at his next visit

## 2023-12-26 NOTE — Assessment & Plan Note (Signed)
 In remission Following with urology and oncology

## 2023-12-26 NOTE — Assessment & Plan Note (Signed)
C-Met and lipid profile today Encourage him to consume a low-fat diet Continue atorvastatin and ezetimibe

## 2023-12-26 NOTE — Assessment & Plan Note (Signed)
Continue Cialis as needed 

## 2023-12-26 NOTE — Assessment & Plan Note (Signed)
CBC and iron panel today 

## 2023-12-26 NOTE — Assessment & Plan Note (Signed)
 CBC today.

## 2023-12-26 NOTE — Progress Notes (Signed)
 Subjective:    Patient ID: Marc Aguilar, male    DOB: [birth year], 63 y.o.   MRN: [id number]  HPI  Patient presents to clinic today for 64-month follow-up of chronic conditions.  Migraines: These occur rarely.  Triggered by neck pain, lack of sleep.  He takes ibuprofen and tylenol as needed with some relief of symptoms.  He does not follow with neurology.  OSA: He averages 4 to 5 hours per night with the use of his CPAP.  Sleep study from 12/2016 reviewed.  HLD: His last LDL was 125, triglycerides 209, 09/2023.  He denies myalgias on atorvastatin and ezetimibe.  He does not consume a low-fat diet.  HTN: His BP today is 118/68.  He is taking amlodipine as prescribed.  ECG from 712/2024 reviewed.  DM2: His last A1c was 8.3%, 09/2023.  His sugars range 101-300.  He is taking glipizide and mounjaro as prescribed.Marland Kitchen  He checks his feet routinely.  His last eye exam was 06/2023.  Flu 06/2023.  Pneumovax 10/2018.  COVID Pfizer x 3.  Anxiety and depression: Chronic, managed on citalopram and bupropion.  He is not currently seeing a therapist.  He denies SI/HI.  GERD: He is not sure what triggers this.  He denies breakthrough on pantoprazole.  Upper GI from 04/2023 reviewed.  ED: Managed with cialis as needed.  He follows with urology.  Insomnia: He has difficulty falling and staying asleep.  He is taking trazodone as needed with good results.  Sleep study from 12/2016 reviewed.  Cervical radiculitis: Status post surgical intervention.  MRI cervical spine from 02/2022 reviewed. He is taking methocarbamol, ibuprofen and tylenol OTC with minimal relief of symptoms. He plans to have surgery soon. He follows with neurosurgery.  Chronic right shoulder pain: Status post surgical intervention. MRI from 02/2022 reviewed. He is taking celebrex, ibuprofen and tylenol OTC with minimal relief of symptoms.  He follows with orthopedics.  Chronic right hip pain: He is taking celebrex, ibuprofen and tylenol OTC with  minimal relief of symptoms. He is following with orthopedics for this.   History of prostate cancer: He is currently undergoing radiation. He had a prostatectomy 2 years ago. He follows with oncology and urology.   Leukocytosis: His last WBC count was 2.9, 09/2023.  He does not follow with hematology.  Anemia: His/H/H was 11.7/34.9, 09/2023.  He is not taking any oral iron at this time.  He does not follow with hematology.  Review of Systems     Past Medical History:  Diagnosis Date   Anxiety    Arthritis    "knees and hips" (02/28/2018)   Cervical radiculitis    Chicken pox    Depression    GERD (gastroesophageal reflux disease)    Heart murmur    Hernia, abdominal    High cholesterol    History of gout    History of hiatal hernia    Hyperlipidemia associated with type 2 diabetes mellitus (HCC)    Hypertension    Migraine    "probably monthly" (02/28/2018)   Motion sickness    ocean boats, circular motion   Neck pain    OSA on CPAP    Osteoarthritis    Prostate cancer (HCC) 2021   surgery and radiation treatments   Refusal of blood transfusions as patient is Jehovah's Witness    Seasonal allergies    Type II diabetes mellitus (HCC)     Current Outpatient Medications  Medication Sig Dispense Refill   Alcohol Swabs (  ALCOHOL PREP) PADS 1 each by Does not apply route 2 (two) times daily. 200 each 3   amLODipine (NORVASC) 10 MG tablet Take 1 tablet (10 mg total) by mouth daily. 90 tablet 0   aspirin 325 MG tablet Take 325 mg by mouth daily.     atorvastatin (LIPITOR) 20 MG tablet Take 1 tablet (20 mg total) by mouth daily. (Patient taking differently: Take 20 mg by mouth at bedtime.) 90 tablet 0   Blood Glucose Monitoring Suppl (BLOOD GLUCOSE MONITOR SYSTEM) w/Device KIT Use as directed to check blood sugar once daily. 1 kit 0   buPROPion (WELLBUTRIN XL) 300 MG 24 hr tablet Take 1 tablet (300 mg total) by mouth daily. 90 tablet 0   celecoxib (CELEBREX) 200 MG capsule Take 1  capsule (200 mg total) by mouth every 12 (twelve) hours. 28 capsule 0   citalopram (CELEXA) 40 MG tablet Take 1 tablet (40 mg total) by mouth daily. 90 tablet 0   ezetimibe (ZETIA) 10 MG tablet Take 1 tablet (10 mg total) by mouth daily. 90 tablet 0   glipiZIDE (GLUCOTROL XL) 10 MG 24 hr tablet Take 1 tablet (10 mg total) by mouth daily with breakfast. 90 tablet 1   Glucose Blood (BLOOD GLUCOSE TEST STRIPS) STRP Use to check blood sugar once a day. 100 strip 1   Lancets (FREESTYLE) lancets Use as directed to check blood sugar once daily. 100 each 1   methocarbamol (ROBAXIN) 500 MG tablet Take 1 tablet (500 mg total) by mouth every 8 (eight) hours as needed for muscle spasms. 30 tablet 0   mupirocin ointment (BACTROBAN) 2 % Apply 1 Application topically 2 (two) times daily. 22 g 0   pantoprazole (PROTONIX) 40 MG tablet Take 1 tablet (40 mg total) by mouth 2 (two) times daily before a meal. 60 tablet 5   tadalafil (CIALIS) 20 MG tablet Take 1 tablet (20 mg total) by mouth daily as needed for erectile dysfunction. 10 tablet 6   tirzepatide (MOUNJARO) 5 MG/0.5ML Pen Inject 5 mg into the skin once a week. 6 mL 0   traZODone (DESYREL) 100 MG tablet Take 2 tablets (200 mg total) by mouth at bedtime. 180 tablet 1   No current facility-administered medications for this visit.    Allergies  Allergen Reactions   Tape Rash    Paper tape-blisters    Family History  Problem Relation Age of Onset   Diabetes Mother    Hyperlipidemia Mother    Bladder Cancer Father    Liver cancer Sister    Diabetes Sister    Diabetes Maternal Aunt    Arthritis Maternal Grandmother    Diabetes Maternal Grandmother    Arthritis Maternal Grandfather    Arthritis Paternal Grandmother    Diabetes Brother    Heart disease Brother    Colon cancer Neg Hx     Social History   Socioeconomic History   Marital status: Married    Spouse name: Dois Davenport   Number of children: 2   Years of education: Not on file   Highest  education level: Bachelor's degree (e.g., BA, AB, BS)  Occupational History   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  Tobacco Use   Smoking status: Former    Current packs/day: 0.00    Average packs/day: 1 pack/day for 27.0 years (27.0 ttl pk-yrs)    Types: Cigarettes    Start date: 10/18/1982    Quit date: 10/18/2009    Years since quitting: 14.1   Smokeless  tobacco: Former    Types: Chew    Quit date: 10/18/1994  Vaping Use   Vaping status: Never Used  Substance and Sexual Activity   Alcohol use: Yes    Alcohol/week: 21.0 standard drinks of alcohol    Types: 21 Cans of beer per week    Comment: 6 beers daily   Drug use: Never   Sexual activity: Not on file  Other Topics Concern   Not on file  Social History Narrative   Not on file   Social Drivers of Health   Financial Resource Strain: Low Risk  (03/28/2023)   Overall Financial Resource Strain (CARDIA)    Difficulty of Paying Living Expenses: Not hard at all  Food Insecurity: No Food Insecurity (10/10/2023)   Hunger Vital Sign    Worried About Running Out of Food in the Last Year: Never true    Ran Out of Food in the Last Year: Never true  Transportation Needs: No Transportation Needs (10/10/2023)   PRAPARE - Administrator, Civil Service (Medical): No    Lack of Transportation (Non-Medical): No  Physical Activity: Sufficiently Active (03/28/2023)   Exercise Vital Sign    Days of Exercise per Week: 1 day    Minutes of Exercise per Session: 150+ min  Stress: Stress Concern Present (03/28/2023)   [employer] of Occupational Health - Occupational Stress Questionnaire    Feeling of Stress : Very much  Social Connections: Moderately Isolated (03/28/2023)   Social Connection and Isolation Panel [NHANES]    Frequency of Communication with Friends and Family: Once a week    Frequency of Social Gatherings with Friends and Family: Never    Attends Religious Services: More than 4 times per year    Active Member of [employer] or  Organizations: No    Attends Banker Meetings: Not on file    Marital Status: Married  Catering manager Violence: Not At Risk (10/10/2023)   Humiliation, Afraid, Rape, and Kick questionnaire    Fear of Current or Ex-Partner: No    Emotionally Abused: No    Physically Abused: No    Sexually Abused: No     Constitutional: Patient reports intermittent headaches.  Denies fever, malaise, fatigue, or abrupt weight changes.  HEENT: Denies eye pain, eye redness, ear pain, ringing in the ears, wax buildup, runny nose, nasal congestion, bloody nose, or sore throat. Respiratory: Denies difficulty breathing, shortness of breath, cough or sputum production.   Cardiovascular: Denies chest pain, chest tightness, palpitations or swelling in the hands or feet.  Gastrointestinal: Denies abdominal pain, bloating, constipation, diarrhea or blood in the stool.  GU: Patient reports erectile dysfunction.  Denies urgency, frequency, pain with urination, burning sensation, blood in urine, odor or discharge. Musculoskeletal: Patient reports chronic neck,  right shoulder pain, right hip pain.  Denies decrease in range of motion, difficulty with gait, muscle pain or joint swelling.  Skin: Denies redness, rashes, lesions or ulcercations.  Neurological: Patient reports insomnia, paresthesias of upper extremities.  Denies dizziness, difficulty with memory, difficulty with speech or problems with balance and coordination.  Psych: Patient has a history of anxiety and depression.  Denies SI/HI.  No other specific complaints in a complete review of systems (except as listed in HPI above).  Objective:   Physical Exam BP 118/68 (BP Location: Right Arm, Patient Position: Sitting, Cuff Size: Normal)   Ht 5\' 11"  (1.803 m)   Wt 218 lb (98.9 kg)   BMI 30.40 kg/m  Wt Readings from Last 3 Encounters:  11/22/23 213 lb (96.6 kg)  11/22/23 213 lb (96.6 kg)  11/10/23 215 lb (97.5 kg)    General: Appears his  stated age, obese, in NAD. Skin: Warm, dry and intact. No ulcerations noted. HEENT: Head: normal shape and size; Eyes: sclera white, no icterus, conjunctiva pink, PERRLA and EOMs intact; Cardiovascular: Normal rate and rhythm. S1,S2 noted.  No murmur, rubs or gallops noted. No JVD or BLE edema. No carotid bruits noted. Pulmonary/Chest: Normal effort and positive vesicular breath sounds. No respiratory distress. No wheezes, rales or ronchi noted.  Abdomen: Soft and nontender. Normal bowel sounds.  Musculoskeletal: His has difficulty getting from a sitting to a standing positions. Gait steady without device. Neurological: Alert and oriented. Cranial nerves II-XII grossly intact. Coordination normal.  Psychiatric: Mood and affect normal. Behavior is normal. Judgment and thought content normal.     BMET    Component Value Date/Time   NA 136 09/27/2023 0949   K 4.3 09/27/2023 0949   CL 99 09/27/2023 0949   CO2 26 09/27/2023 0949   GLUCOSE 255 (H) 09/27/2023 0949   BUN 19 09/27/2023 0949   CREATININE 0.97 09/27/2023 0949   CREATININE 0.86 06/28/2023 1525   CALCIUM 9.5 09/27/2023 0949   GFRNONAA >60 09/27/2023 0949   GFRAA >60 11/20/2019 0445    Lipid Panel     Component Value Date/Time   CHOL 216 (H) 09/27/2023 1126   CHOL 157 10/26/2019 1203   TRIG 209 (H) 09/27/2023 1126   HDL 56 09/27/2023 1126   HDL 48 10/26/2019 1203   CHOLHDL 3.9 09/27/2023 1126   VLDL 60.2 (H) 01/01/2021 1047   LDLCALC 125 (H) 09/27/2023 1126    CBC    Component Value Date/Time   WBC 2.9 (L) 10/03/2023 0812   WBC 2.6 (L) 09/27/2023 0949   RBC 4.38 10/03/2023 0812   HGB 11.7 (L) 10/03/2023 0812   HCT 34.9 (L) 10/03/2023 0812   PLT 226 10/03/2023 0812   MCV 79.7 (L) 10/03/2023 0812   MCH 26.7 10/03/2023 0812   MCHC 33.5 10/03/2023 0812   RDW 14.5 10/03/2023 0812   LYMPHSABS 1.9 05/06/2023 2049   MONOABS 0.5 05/06/2023 2049   EOSABS 0.1 05/06/2023 2049   BASOSABS 0.0 05/06/2023 2049    Hgb  A1C Lab Results  Component Value Date   HGBA1C 8.3 (A) 09/27/2023           Assessment & Plan:     RTC in 3 months for your annual exam Nicki Reaper, NP

## 2023-12-26 NOTE — Assessment & Plan Note (Signed)
Controlled on amlodipine Reinforced DASH diet and exercise for weight loss CMET today

## 2023-12-26 NOTE — Assessment & Plan Note (Signed)
 Continue Celebrex, tylenol  OTC Avoid ibuprofen with use of Celebrex Encouraged weight loss as this can help reduce joint pain

## 2023-12-26 NOTE — Patient Instructions (Signed)

## 2023-12-26 NOTE — Assessment & Plan Note (Signed)
Continue trazodone as needed. 

## 2023-12-27 ENCOUNTER — Ambulatory Visit: Payer: 59 | Admitting: Urology

## 2023-12-27 ENCOUNTER — Encounter: Payer: Self-pay | Admitting: Internal Medicine

## 2023-12-27 VITALS — BP 134/83 | HR 91 | Ht 71.0 in | Wt 213.0 lb

## 2023-12-27 DIAGNOSIS — R32 Unspecified urinary incontinence: Secondary | ICD-10-CM | POA: Diagnosis not present

## 2023-12-27 DIAGNOSIS — N5231 Erectile dysfunction following radical prostatectomy: Secondary | ICD-10-CM

## 2023-12-27 DIAGNOSIS — C61 Malignant neoplasm of prostate: Secondary | ICD-10-CM

## 2023-12-27 LAB — CBC
HCT: 36.8 % — ABNORMAL LOW (ref 38.5–50.0)
Hemoglobin: 11.8 g/dL — ABNORMAL LOW (ref 13.2–17.1)
MCH: 27.3 pg (ref 27.0–33.0)
MCHC: 32.1 g/dL (ref 32.0–36.0)
MCV: 85.2 fL (ref 80.0–100.0)
MPV: 9.9 fL (ref 7.5–12.5)
Platelets: 255 10*3/uL (ref 140–400)
RBC: 4.32 10*6/uL (ref 4.20–5.80)
RDW: 13.7 % (ref 11.0–15.0)
WBC: 2.7 10*3/uL — ABNORMAL LOW (ref 3.8–10.8)

## 2023-12-27 LAB — LIPID PANEL
Cholesterol: 169 mg/dL (ref <200)
HDL: 55 mg/dL (ref >=40)
LDL Cholesterol (Calc): 84 mg/dL
Non-HDL Cholesterol (Calc): 114 mg/dL (ref <130)
Total CHOL/HDL Ratio: 3.1 (calc) (ref <5.0)
Triglycerides: 205 mg/dL — ABNORMAL HIGH (ref <150)

## 2023-12-27 LAB — IRON,TIBC AND FERRITIN PANEL
%SAT: 24 % (ref 20–48)
Ferritin: 152 ng/mL (ref 24–380)
Iron: 92 ug/dL (ref 50–180)
TIBC: 389 ug/dL (ref 250–425)

## 2023-12-27 LAB — HEMOGLOBIN A1C
Hgb A1c MFr Bld: 5.4 %{Hb} (ref <5.7)
Mean Plasma Glucose: 108 mg/dL
eAG (mmol/L): 6 mmol/L

## 2023-12-27 LAB — COMPLETE METABOLIC PANEL WITH GFR
AG Ratio: 2 (calc) (ref 1.0–2.5)
ALT: 20 U/L (ref 9–46)
AST: 18 U/L (ref 10–35)
Albumin: 4.5 g/dL (ref 3.6–5.1)
Alkaline phosphatase (APISO): 79 U/L (ref 35–144)
BUN: 15 mg/dL (ref 7–25)
CO2: 24 mmol/L (ref 20–32)
Calcium: 9.4 mg/dL (ref 8.6–10.3)
Chloride: 106 mmol/L (ref 98–110)
Creat: 0.74 mg/dL (ref 0.70–1.35)
Globulin: 2.2 g/dL (ref 1.9–3.7)
Glucose, Bld: 214 mg/dL — ABNORMAL HIGH (ref 65–139)
Potassium: 4.1 mmol/L (ref 3.5–5.3)
Sodium: 138 mmol/L (ref 135–146)
Total Bilirubin: 0.4 mg/dL (ref 0.2–1.2)
Total Protein: 6.7 g/dL (ref 6.1–8.1)
eGFR: 104 mL/min/{1.73_m2} (ref >=60)

## 2023-12-27 LAB — PSA: Prostate Specific Ag, Serum: <0.1 ng/mL (ref 0.0–4.0)

## 2023-12-27 MED ORDER — TADALAFIL 20 MG PO TABS: 20.0000 mg | ORAL_TABLET | Freq: Every day | ORAL | 11 refills | Status: AC | PRN

## 2023-12-27 NOTE — Progress Notes (Signed)
 I,Marc Aguilar,acting as a scribe for Marc Scotland, MD.,have documented all relevant documentation on the behalf of Marc Scotland, MD,as directed by  Marc Scotland, MD while in the presence of Marc Scotland, MD.  12/27/2023 10:16 AM   Marc Aguilar 27-May-1961 [id number]  Referring provider: Lorre Munroe, NP [address] [place],  [place] [address]  Chief Complaint  Patient presents with   Prostate Cancer    HPI: 63 year-old male presents today for six month follow up.  He is s/p robotic assisted laparoscopic radical prostatectomy on 11/2019. Surgical pathology revealed Gleason 4+3 with > 5% of tertiary pattern 5 + EFE nonfocal - SB+ margin on the left posterior, neg lymph nodes; pT3a pN0.    His PSA had remained undetectable but in 01/2023, it bumped up to 0.1. His most recent PSA on 06/27/2023 was 0.2.   He met the criteria for biochemical recurrence. He received a six month depo of Eligard and underwent salvage radiation completed in December 2024.   His most recent PSA from 12/26/2023 is undetectable.  He said radiation went well. He still gets hot flashes occasionally.   He has recently started taking Mounjaro for his diabetes and has lost some weight.  He reports mild urinary incontinence, which is manageable unless he drinks a lot of fluids or is unable to access a bathroom at work.  He requests a refill of Cialis.  PMH: Past Medical History:  Diagnosis Date   Anxiety    Arthritis    "knees and hips" (02/28/2018)   Cervical radiculitis    Chicken pox    Depression    GERD (gastroesophageal reflux disease)    Heart murmur    Hernia, abdominal    High cholesterol    History of gout    History of hiatal hernia    Hyperlipidemia associated with type 2 diabetes mellitus (HCC)    Hypertension    Migraine    "probably monthly" (02/28/2018)   Motion sickness    ocean boats, circular motion   Neck pain    OSA on CPAP    Osteoarthritis    Prostate cancer (HCC)  2021   surgery and radiation treatments   Refusal of blood transfusions as patient is Jehovah's Witness    Seasonal allergies    Type II diabetes mellitus (HCC)     Surgical History: Past Surgical History:  Procedure Laterality Date   CARDIAC CATHETERIZATION  02/28/2018   ESOPHAGOGASTRODUODENOSCOPY (EGD) WITH PROPOFOL N/A 03/17/2023   Procedure: ESOPHAGOGASTRODUODENOSCOPY (EGD) WITH PROPOFOL;  Surgeon: [name], MD;  Location: [facility];  Service: Endoscopy;  Laterality: N/A;  Diabetic   ESOPHAGOGASTRODUODENOSCOPY (EGD) WITH PROPOFOL N/A 04/18/2023   Procedure: ESOPHAGOGASTRODUODENOSCOPY (EGD) WITH PROPOFOL;  Surgeon: [name], MD;  Location: [facility];  Service: Endoscopy;  Laterality: N/A;   LEFT HEART CATH AND CORONARY ANGIOGRAPHY N/A 02/28/2018   Procedure: LEFT HEART CATH AND CORONARY ANGIOGRAPHY;  Surgeon: Tonny Bollman, MD;  Location: [facility] IN[facility] CV LAB;  Service: Cardiovascular;  Laterality: N/A;   ROBOT ASSISTED LAPAROSCOPIC RADICAL PROSTATECTOMY N/A 11/19/2019   Procedure: XI ROBOTIC ASSISTED LAPAROSCOPIC RADICAL PROSTATECTOMY WITH LYMPH NODE DISSECTION;  Surgeon: Marc Scotland, MD;  Location: [facility];  Service: Urology;  Laterality: N/A;   SHOULDER ARTHROSCOPY WITH ROTATOR CUFF REPAIR AND SUBACROMIAL DECOMPRESSION Right 05/21/2022   Procedure: Right shoulder arthroscopic rotator cuff repair (supraspinatus & subscapularis), subacromial decompression, and biceps tenodesis;  Surgeon: Signa Kell, MD;  Location: [facility];  Service: Orthopedics;  Laterality: Right;   VASECTOMY     WRIST GANGLION EXCISION Left 1984    Home Medications:  Allergies as of 12/27/2023       Reactions   Tape Rash   Paper tape-blisters        Medication List        Accurate as of December 27, 2023 10:16 AM. If you have any questions, ask your nurse or doctor.          Alcohol Prep Pads 1 each by Does not apply route 2 (two) times daily.   amLODipine 10 MG  tablet Commonly known as: NORVASC Take 1 tablet (10 mg total) by mouth daily.   aspirin 325 MG tablet Take 325 mg by mouth daily.   atorvastatin 20 MG tablet Commonly known as: LIPITOR Take 1 tablet (20 mg total) by mouth daily. What changed: when to take this   buPROPion 300 MG 24 hr tablet Commonly known as: WELLBUTRIN XL Take 1 tablet (300 mg total) by mouth daily.   celecoxib 200 MG capsule Commonly known as: CELEBREX Take 1 capsule (200 mg total) by mouth every 12 (twelve) hours.   citalopram 40 MG tablet Commonly known as: CELEXA Take 1 tablet (40 mg total) by mouth daily.   ezetimibe 10 MG tablet Commonly known as: ZETIA Take 1 tablet (10 mg total) by mouth daily.   FreeStyle Freedom Lite w/Device Kit Use as directed to check blood sugar once daily.   freestyle lancets Use as directed to check blood sugar once daily.   FREESTYLE LITE test strip Generic drug: glucose blood Use to check blood sugar once a day.   glipiZIDE 10 MG 24 hr tablet Commonly known as: GLUCOTROL XL Take 1 tablet (10 mg total) by mouth daily with breakfast.   methocarbamol 500 MG tablet Commonly known as: ROBAXIN Take 1 tablet (500 mg total) by mouth every 8 (eight) hours as needed for muscle spasms.   Mounjaro 5 MG/0.5ML Pen Generic drug: tirzepatide Inject 5 mg into the skin once a week.   pantoprazole 40 MG tablet Commonly known as: PROTONIX Take 1 tablet (40 mg total) by mouth daily.   tadalafil 20 MG tablet Commonly known as: CIALIS Take 1 tablet (20 mg total) by mouth daily as needed for erectile dysfunction.   traZODone 100 MG tablet Commonly known as: DESYREL Take 2 tablets (200 mg total) by mouth at bedtime.        Allergies:  Allergies  Allergen Reactions   Tape Rash    Paper tape-blisters    Family History: Family History  Problem Relation Age of Onset   Diabetes Mother    Hyperlipidemia Mother    Bladder Cancer Father    Liver cancer Sister     Diabetes Sister    Diabetes Maternal Aunt    Arthritis Maternal Grandmother    Diabetes Maternal Grandmother    Arthritis Maternal Grandfather    Arthritis Paternal Grandmother    Diabetes Brother    Heart disease Brother    Colon cancer Neg Hx     Social History:  reports that he quit smoking about 14 years ago. His smoking use included cigarettes. He started smoking about 41 years ago. He has a 27 pack-year smoking history. He quit smokeless tobacco use about 29 years ago.  His smokeless tobacco use included chew. He reports current alcohol use of about 21.0 standard drinks of alcohol per week. He reports that he does not use drugs.   Physical  Exam: BP 134/83   Pulse 91   Ht 5\' 11"  (1.803 m)   Wt 213 lb (96.6 kg)   BMI 29.71 kg/m   Constitutional:  Alert and oriented, No acute distress. HEENT: [place] AT, moist mucus membranes.  Trachea midline, no masses. Neurologic: Grossly intact, no focal deficits, moving all 4 extremities. Psychiatric: Normal mood and affect.   Assessment & Plan:    1. Prostate Cancer, post-salvage radiation therapy - PSA is undetectable, indicating effective treatment. No further hormone therapy is planned. Monitor for recurrence with PSA testing in six months and annually thereafter.  2. Urinary Incontinence - Mild incontinence reported, manageable with current lifestyle adjustments. No additional treatment required.  3. Erectile Dysfunction - Refill prescription for generic Cialis to be sent to [facility].  Return in about 1 year (around 12/26/2024) for PSA.  (PSA only in 6 months)  I have reviewed the above documentation for accuracy and completeness, and I agree with the above.   Marc Scotland, MD  [facility] [address] [place], [place] [address] [phone]

## 2023-12-29 ENCOUNTER — Encounter: Payer: Self-pay | Admitting: Neurosurgery

## 2023-12-29 ENCOUNTER — Ambulatory Visit: Payer: Commercial Managed Care - PPO | Admitting: Neurosurgery

## 2023-12-29 ENCOUNTER — Other Ambulatory Visit: Payer: Self-pay

## 2023-12-29 ENCOUNTER — Ambulatory Visit
Admission: RE | Admit: 2023-12-29 | Discharge: 2023-12-29 | Disposition: A | Discharge status: Home / Self Care | Attending: Neurosurgery | Admitting: Neurosurgery

## 2023-12-29 ENCOUNTER — Ambulatory Visit
Admission: RE | Admit: 2023-12-29 | Discharge: 2023-12-29 | Disposition: A | Discharge status: Home / Self Care | Source: Ambulatory Visit | Attending: Neurosurgery | Admitting: Neurosurgery

## 2023-12-29 ENCOUNTER — Encounter: Payer: Self-pay | Admitting: Neurology

## 2023-12-29 VITALS — BP 134/82 | Temp 99.2°F | Ht 71.0 in | Wt 213.0 lb

## 2023-12-29 DIAGNOSIS — M5412 Radiculopathy, cervical region: Secondary | ICD-10-CM

## 2023-12-29 DIAGNOSIS — R202 Paresthesia of skin: Secondary | ICD-10-CM

## 2023-12-29 DIAGNOSIS — Z09 Encounter for follow-up examination after completed treatment for conditions other than malignant neoplasm: Secondary | ICD-10-CM

## 2023-12-29 DIAGNOSIS — Z981 Arthrodesis status: Secondary | ICD-10-CM | POA: Diagnosis not present

## 2023-12-29 DIAGNOSIS — M5440 Lumbago with sciatica, unspecified side: Secondary | ICD-10-CM

## 2023-12-29 DIAGNOSIS — M5416 Radiculopathy, lumbar region: Secondary | ICD-10-CM

## 2023-12-29 NOTE — Progress Notes (Signed)
   REFERRING PHYSICIAN:  Daryel Gerald [address] [place],  [place] [address]  DOS: 10/10/23 C4-6 arthroplasty  HISTORY OF PRESENT ILLNESS:  12/29/23 Marc Aguilar presenting today about 2.5 months out from surgery. He is overall doing well from a cervical standpoint.  He still discomfort in his neck as some hand numbness but admits he feels better than he did before surgery.  He continues to have low back and right leg pain.  He had an MRI about a month ago of his lumbar spine which did not show any significant compression in his lower back.  11/22/23 [name] is status post cervical arthroplasty. Overall, he is doing well.  His symptoms have improved.  He still having some neck discomfort.  His swallowing has improved.  He has some hoarseness in his voice.    He has been having pain down his right leg particularly with walking for the past 6 to 8 months.  This bothers him when he stands or walks.  It is always on his right leg down the lateral and posterior aspect of his thigh to his anterolateral calf.  He denies weakness.   PHYSICAL EXAMINATION:  NEUROLOGICAL:  General: In no acute distress.   Awake, alert, oriented to person, place, and time.  Pupils equal round and reactive to light.    Strength: Side Biceps Triceps Deltoid Interossei Grip Wrist Ext. Wrist Flex.  R 5 5 5 5 5 5 5   L 5 5 5 5 5 5 5   5  out of 5 strength in his bilateral lower extremities.    Sensation intact to light touch  Incision c/d/I and healing well  Imaging:  No complications noted-there has been some settling but is within normal parameters  Assessment / Plan: Marc Aguilar is doing well after arthroplasty.  We discussed that numbness can take some time to improve and I reassured him that he has made significant improvement since surgery.  In regards to his lumbar spine, his MRI did not show any explanation.  I recommended physical therapy.  He would like to go to [place] [facility] and [place] for this.   We also discussed getting an EMG.  He would like to go forward with this.  I placed a referral to neurology for this.  I will see him back via telephone visit to review his EMG results. He will otherwise return to clinic cervical x-rays to see Dr. Tanya Nones PA-C Dept of Neurosurgery

## 2024-01-20 ENCOUNTER — Ambulatory Visit (INDEPENDENT_AMBULATORY_CARE_PROVIDER_SITE_OTHER): Admitting: Neurology

## 2024-01-20 DIAGNOSIS — R202 Paresthesia of skin: Secondary | ICD-10-CM | POA: Diagnosis not present

## 2024-01-20 NOTE — Procedures (Signed)
 [facility]  [address] [place], [address]  [place], [place] [address] Tel: [phone] Fax: [phone] Test Date:  01/20/2024  Patient: Marc Aguilar DOB: [birth year] Physician: Nita Sickle, DO  Sex: Male Height: 5\' 11"  Ref Phys: Susanne Borders, [place]  ID#: [id number]   Technician:    History: This is a 63 year old man referred for evaluation of right leg paresthesias and pain.  NCV & EMG Findings: Electrodiagnostic testing of the right lower extremity and additional studies of the left shows: Bilateral sural and superficial peroneal sensory responses are within normal limits. Bilateral peroneal and tibial motor responses are within normal limits. Bilateral tibial H reflex studies are absent and in isolation, these findings are of uncertain clinical significance.  There is no evidence of active or chronic motor axonal changes affecting any of the tested muscles.  Motor unit configuration and recruitment pattern is within normal limits.   Impression: This is a normal study of the lower extremities.  In particular, there is no evidence of a large fiber sensorimotor polyneuropathy or lumbosacral radiculopathy.    ___________________________ Nita Sickle, DO    Nerve Conduction Studies   Stim Site NR Peak (ms) Norm Peak (ms) O-P Amp (V) Norm O-P Amp  Left Sup Peroneal Anti Sensory (Ant Lat Mall)  32 C  12 cm    2.6 <4.6 5.8 >3  Right Sup Peroneal Anti Sensory (Ant Lat Mall)  32 C  12 cm    2.6 <4.6 5.1 >3  Left Sural Anti Sensory (Lat Mall)  32 C  Calf    2.5 <4.6 5.8 >3  Right Sural Anti Sensory (Lat Mall)  32 C  Calf    2.6 <4.6 4.8 >3     Stim Site NR Onset (ms) Norm Onset (ms) O-P Amp (mV) Norm O-P Amp Site1 Site2 Delta-0 (ms) Dist (cm) Vel (m/s) Norm Vel (m/s)  Left Peroneal Motor (Ext Dig Brev)  32 C  Ankle    3.9 <6.0 4.0 >2.5 B Fib Ankle 8.6 37.0 43 >40  B Fib    12.5  3.4  Poplt B Fib 2.0 10.0 50 >40  Poplt    14.5  3.3         Right Peroneal  Motor (Ext Dig Brev)  32 C  Ankle    3.2 <6.0 5.0 >2.5 B Fib Ankle 8.8 37.0 42 >40  B Fib    12.0  4.5  Poplt B Fib 1.8 10.0 56 >40  Poplt    13.8  4.3         Left Tibial Motor (Abd Hall Brev)  32 C  Ankle    4.5 <6.0 8.6 >4 Knee Ankle 10.7 44.0 41 >40  Knee    15.2  4.7         Right Tibial Motor (Abd Hall Brev)  32 C  Ankle    4.5 <6.0 5.2 >4 Knee Ankle 9.1 44.0 48 >40  Knee    13.6  2.9          Electromyography   Side Muscle Ins.Act Fibs Fasc Recrt Amp Dur Poly Activation Comment  Right AntTibialis Nml Nml Nml Nml Nml Nml Nml Nml N/A  Right Gastroc Nml Nml Nml Nml Nml Nml Nml Nml N/A  Right Flex Dig Long Nml Nml Nml Nml Nml Nml Nml Nml N/A  Right RectFemoris Nml Nml Nml Nml Nml Nml Nml Nml N/A  Right GluteusMed Nml Nml Nml Nml Nml Nml Nml Nml N/A  Left  AntTibialis Nml Nml Nml Nml Nml Nml Nml Nml N/A  Left Gastroc Nml Nml Nml Nml Nml Nml Nml Nml N/A  Left Flex Dig Long Nml Nml Nml Nml Nml Nml Nml Nml N/A  Left RectFemoris Nml Nml Nml Nml Nml Nml Nml Nml N/A  Left GluteusMed Nml Nml Nml Nml Nml Nml Nml Nml N/A      Waveforms:

## 2024-01-26 ENCOUNTER — Ambulatory Visit (INDEPENDENT_AMBULATORY_CARE_PROVIDER_SITE_OTHER): Admitting: Neurosurgery

## 2024-01-26 DIAGNOSIS — M5416 Radiculopathy, lumbar region: Secondary | ICD-10-CM

## 2024-01-26 NOTE — Progress Notes (Signed)
 Neurosurgery Telephone (Audio-Only) Note  Requesting Provider     Lorre Munroe, NP [address] [place],  [place] [address] T: [phone] F: [phone]  Primary Care Provider Lorre Munroe, NP [address] [place] [address] T: [phone] F: [phone]  Telehealth visit was conducted with Marc Aguilar, a 63 y.o. male via telephone.  History of Present Illness: Marc Aguilar is a 63 year old presenting today via telephone visit to review his EMG results.  12/29/23 DOS: 10/10/23 C4-6 arthroplasty  HISTORY OF PRESENT ILLNESS:  12/29/23 Marc Aguilar presenting today about 2.5 months out from surgery. He is overall doing well from a cervical standpoint.  He still discomfort in his neck as some hand numbness but admits he feels better than he did before surgery.  He continues to have low back and right leg pain.  He had an MRI about a month ago of his lumbar spine which did not show any significant compression in his lower back.   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 Overall, he is doing well.  His symptoms have improved.  He still having some neck discomfort.  His swallowing has improved.  He has some hoarseness in his voice.      He has been having pain down his right leg particularly with walking for the past 6 to 8 months.  This bothers him when he stands or walks.  It is always on his right leg down the lateral and posterior aspect of his thigh to his anterolateral calf.  He denies weakness.   General Review of Systems:  A ROS was performed including pertinent positive and negatives as documented.  All other systems are negative.  Prior to Admission medications   Medication Sig Start Date End Date Taking? Authorizing Provider  Alcohol Swabs (ALCOHOL PREP) PADS 1 each by Does not apply route 2 (two) times daily. 02/01/17   Lorre Munroe, NP  amLODipine (NORVASC) 10 MG tablet Take 1 tablet (10 mg total) by mouth daily. 12/20/23   Lorre Munroe, NP  aspirin  325 MG tablet Take 325 mg by mouth daily.    [provider]  atorvastatin (LIPITOR) 20 MG tablet Take 1 tablet (20 mg total) by mouth daily. Patient taking differently: Take 20 mg by mouth at bedtime. 05/13/21   Worthy Rancher B, FNP  Blood Glucose Monitoring Suppl (BLOOD GLUCOSE MONITOR SYSTEM) w/Device KIT Use as directed to check blood sugar once daily. 06/29/23   Lorre Munroe, NP  buPROPion (WELLBUTRIN XL) 300 MG 24 hr tablet Take 1 tablet (300 mg total) by mouth daily. 12/20/23   Lorre Munroe, NP  celecoxib (CELEBREX) 200 MG capsule Take 1 capsule (200 mg total) by mouth every 12 (twelve) hours. 12/19/23   Joan Flores, PA-C  citalopram (CELEXA) 40 MG tablet Take 1 tablet (40 mg total) by mouth daily. 12/20/23   Lorre Munroe, NP  ezetimibe (ZETIA) 10 MG tablet Take 1 tablet (10 mg total) by mouth daily. 12/20/23   Lorre Munroe, NP  glipiZIDE (GLUCOTROL XL) 10 MG 24 hr tablet Take 1 tablet (10 mg total) by mouth daily with breakfast. 09/22/23   Lorre Munroe, NP  Glucose Blood (BLOOD GLUCOSE TEST STRIPS) STRP Use to check blood sugar once a day. 06/29/23   Lorre Munroe, NP  Lancets (FREESTYLE) lancets Use as directed to check blood sugar once daily. 06/29/23   Lorre Munroe, NP  methocarbamol (ROBAXIN) 500 MG  tablet Take 1 tablet (500 mg total) by mouth every 8 (eight) hours as needed for muscle spasms. 12/19/23   Joan Flores, PA-C  pantoprazole (PROTONIX) 40 MG tablet Take 1 tablet (40 mg total) by mouth daily. 12/26/23   Lorre Munroe, NP  tadalafil (CIALIS) 20 MG tablet Take 1 tablet (20 mg total) by mouth daily as needed for erectile dysfunction. 12/27/23   Vanna Scotland, MD  tirzepatide [facility]) 5 MG/0.5ML Pen Inject 5 mg into the skin once a week. 10/25/23   Lorre Munroe, NP  traZODone (DESYREL) 100 MG tablet Take 2 tablets (200 mg total) by mouth at bedtime. 09/22/23   Lorre Munroe, NP    DATA REVIEWED    Imaging Studies  01/20/24 EMG Impression: This is a  normal study of the lower extremities.  In particular, there is no evidence of a large fiber sensorimotor polyneuropathy or lumbosacral radiculopathy.  IMPRESSION  Marc Aguilar is a 63 y.o. male who I performed a telephone encounter today for evaluation and management of low back and leg pain unexplained by MRI and EMG results   PLAN  I recommended that Marc Aguilar move forward with PT as recommended. He will the [place] office to get scheduled. Should therapy not help we discussed evaluation for other causes including potentially having him see vascular.  We will keep his interval arthroplasty follow-up which is scheduled with Dr. Marcell Barlow on 06/23/2024.  No orders of the defined types were placed in this encounter.   DISPOSITION  Follow up: In person appointment in  5 months as previously scheduled   Susanne Borders, PA Neurosurgery  TELEPHONE DOCUMENTATION   This visit was performed via telephone.  Patient location: home Provider location: office  I spent a total of 5 minutes non-face-to-face activities for this visit on the date of this encounter including review of current clinical condition and response to treatment.  The patient is aware of and accepts the limits of this telehealth visit.

## 2024-01-28 ENCOUNTER — Other Ambulatory Visit: Payer: Self-pay | Admitting: Internal Medicine

## 2024-01-28 ENCOUNTER — Other Ambulatory Visit: Payer: Self-pay | Admitting: Physician Assistant

## 2024-01-28 DIAGNOSIS — K219 Gastro-esophageal reflux disease without esophagitis: Secondary | ICD-10-CM

## 2024-01-28 MED FILL — Trazodone HCl Tab 100 MG: ORAL | 90 days supply | Qty: 180 | Fill #1 | Status: AC

## 2024-01-30 ENCOUNTER — Other Ambulatory Visit: Payer: Self-pay

## 2024-01-30 ENCOUNTER — Other Ambulatory Visit: Payer: Self-pay | Admitting: Internal Medicine

## 2024-01-30 DIAGNOSIS — F331 Major depressive disorder, recurrent, moderate: Secondary | ICD-10-CM | POA: Diagnosis not present

## 2024-01-30 DIAGNOSIS — F411 Generalized anxiety disorder: Secondary | ICD-10-CM | POA: Diagnosis not present

## 2024-01-30 MED ORDER — METHOCARBAMOL 500 MG PO TABS
500.0000 mg | ORAL_TABLET | Freq: Three times a day (TID) | ORAL | 0 refills | Status: DC | PRN
  Filled 2024-01-30: qty 30, 10d supply, fill #0

## 2024-01-30 MED ORDER — CELECOXIB 200 MG PO CAPS
200.0000 mg | ORAL_CAPSULE | Freq: Two times a day (BID) | ORAL | 0 refills | Status: DC
Start: 2024-01-30 — End: 2024-04-09
  Filled 2024-01-30: qty 28, 14d supply, fill #0

## 2024-01-30 MED ORDER — PANTOPRAZOLE SODIUM 40 MG PO TBEC: 40.0000 mg | DELAYED_RELEASE_TABLET | Freq: Every day | ORAL | Status: DC

## 2024-01-31 ENCOUNTER — Other Ambulatory Visit: Payer: Self-pay

## 2024-01-31 MED FILL — Tirzepatide Soln Auto-injector 5 MG/0.5ML: SUBCUTANEOUS | 84 days supply | Qty: 6 | Fill #0 | Status: AC

## 2024-01-31 NOTE — Telephone Encounter (Signed)
 Requested Prescriptions  Pending Prescriptions Disp Refills   MOUNJARO 5 MG/0.5ML Pen [Pharmacy Med Name: tirzepatide (MOUNJARO) 5 MG/0.5ML Pen] 6 mL 0    Sig: Inject 5 mg into the skin once a week.     Off-Protocol Failed - 01/31/2024  1:52 PM      Failed - Medication not assigned to a protocol, review manually.      Passed - Valid encounter within last 12 months    Recent Outpatient Visits           1 month ago Type 2 diabetes mellitus with hyperglycemia, without long-term current use of insulin [facility])   [place] [facility] [place], Rankin Buzzard, NP   2 months ago Cervical radiculitis   [place] [facility] [place], Rankin Buzzard, NP       Future Appointments             In 10 months Dustin Gimenez, MD [facility] [place]            Refused Prescriptions Disp Refills   amLODipine (NORVASC) 10 MG tablet 90 tablet 0    Sig: Take 1 tablet (10 mg total) by mouth daily.     Cardiovascular: Calcium Channel Blockers 2 Passed - 01/31/2024  1:52 PM      Passed - Last BP in normal range    BP Readings from Last 1 Encounters:  12/29/23 134/82         Passed - Last Heart Rate in normal range    Pulse Readings from Last 1 Encounters:  12/27/23 91         Passed - Valid encounter within last 6 months    Recent Outpatient Visits           1 month ago Type 2 diabetes mellitus with hyperglycemia, without long-term current use of insulin [facility])   [place] [facility] [place], Rankin Buzzard, NP   2 months ago Cervical radiculitis    [facility] [place], Rankin Buzzard, NP       Future Appointments             In 10 months Dustin Gimenez, MD [facility] [place]             buPROPion (WELLBUTRIN XL) 300 MG 24 hr tablet 90 tablet 0    Sig: Take 1 tablet (300 mg total) by mouth daily.     Psychiatry: Antidepressants - bupropion Passed - 01/31/2024  1:52 PM      Passed - Cr in normal range  and within 360 days    Creat  Date Value Ref Range Status  12/26/2023 0.74 0.70 - 1.35 mg/dL Final   Creatinine,U  Date Value Ref Range Status  11/14/2018 183.0 mg/dL Final   Creatinine, Urine  Date Value Ref Range Status  06/28/2023 91 20 - 320 mg/dL Final         Passed - AST in normal range and within 360 days    AST  Date Value Ref Range Status  12/26/2023 18 10 - 35 U/L Final         Passed - ALT in normal range and within 360 days    ALT  Date Value Ref Range Status  12/26/2023 20 9 - 46 U/L Final         Passed - Completed PHQ-2 or PHQ-9 in the last 360 days      Passed - Last BP in  normal range    BP Readings from Last 1 Encounters:  12/29/23 134/82         Passed - Valid encounter within last 6 months    Recent Outpatient Visits           1 month ago Type 2 diabetes mellitus with hyperglycemia, without long-term current use of insulin [facility])   [place] [facility] [place], Salvadore Oxford, NP   2 months ago Cervical radiculitis   [place] [facility] [place], Salvadore Oxford, NP       Future Appointments             In 10 months Vanna Scotland, MD [facility] [place]             citalopram (CELEXA) 40 MG tablet 90 tablet 0    Sig: Take 1 tablet (40 mg total) by mouth daily.     Psychiatry:  Antidepressants - SSRI Passed - 01/31/2024  1:52 PM      Passed - Completed PHQ-2 or PHQ-9 in the last 360 days      Passed - Valid encounter within last 6 months    Recent Outpatient Visits           1 month ago Type 2 diabetes mellitus with hyperglycemia, without long-term current use of insulin [facility])   [place] [facility] [place], Salvadore Oxford, NP   2 months ago Cervical radiculitis   [place] [facility] [place], Salvadore Oxford, NP       Future Appointments             In 10 months Vanna Scotland, MD [facility]              ezetimibe (ZETIA) 10 MG tablet 90  tablet 0    Sig: Take 1 tablet (10 mg total) by mouth daily.     Cardiovascular:  Antilipid - Sterol Transport Inhibitors Failed - 01/31/2024  1:52 PM      Failed - Lipid Panel in normal range within the last 12 months    Cholesterol, Total  Date Value Ref Range Status  10/26/2019 157 100 - 199 mg/dL Final   Cholesterol  Date Value Ref Range Status  12/26/2023 169 <200 mg/dL Final   LDL Cholesterol (Calc)  Date Value Ref Range Status  12/26/2023 84 mg/dL (calc) Final    Comment:    Reference range: <100 . Desirable range <100 mg/dL for primary prevention;   <70 mg/dL for patients with CHD or diabetic patients  with > or = 2 CHD risk factors. Marland Kitchen LDL-C is now calculated using the Martin-Hopkins  calculation, which is a validated novel method providing  better accuracy than the Friedewald equation in the  estimation of LDL-C.  Horald Pollen et al. [place] Ahr. 1610;960(45): 2061-2068  (http://education.[employer].com/faq/FAQ164)    Direct LDL  Date Value Ref Range Status  01/01/2021 127.0 mg/dL Final    Comment:    Optimal:  <100 mg/dLNear or Above Optimal:  100-129 mg/dLBorderline High:  130-159 mg/dLHigh:  160-189 mg/dLVery High:  >190 mg/dL   HDL  Date Value Ref Range Status  12/26/2023 55 > OR = 40 mg/dL Final  40/98/1191 48 >47 mg/dL Final   Triglycerides  Date Value Ref Range Status  12/26/2023 205 (H) <150 mg/dL Final    Comment:    . If a non-fasting specimen was collected, consider repeat triglyceride testing on a fasting specimen if clinically indicated.  Perry Mount et al. J. of Clin. Lipidol. 2015;9:129-169. Marland Kitchen          Passed - AST in normal range and within 360 days    AST  Date Value Ref Range Status  12/26/2023 18 10 - 35 U/L Final         Passed - ALT in normal range and within 360 days    ALT  Date Value Ref Range Status  12/26/2023 20 9 - 46 U/L Final         Passed - Patient is not pregnant      Passed - Valid encounter within last 12  months    Recent Outpatient Visits           1 month ago Type 2 diabetes mellitus with hyperglycemia, without long-term current use of insulin [facility])   [place] [facility] [place], Salvadore Oxford, NP   2 months ago Cervical radiculitis   [facility] [place], Salvadore Oxford, NP       Future Appointments             In 10 months Vanna Scotland, MD [facility] [place]             glipiZIDE (GLUCOTROL XL) 10 MG 24 hr tablet 90 tablet 1    Sig: Take 1 tablet (10 mg total) by mouth daily with breakfast.     Endocrinology:  Diabetes - Sulfonylureas Passed - 01/31/2024  1:52 PM      Passed - HBA1C is between 0 and 7.9 and within 180 days    Hgb A1c MFr Bld  Date Value Ref Range Status  12/26/2023 5.4 <5.7 % of total Hgb Final    Comment:    For the purpose of screening for the presence of diabetes: . <5.7%       Consistent with the absence of diabetes 5.7-6.4%    Consistent with increased risk for diabetes             (prediabetes) > or =6.5%  Consistent with diabetes . This assay result is consistent with a decreased risk of diabetes. . Currently, no consensus exists regarding use of hemoglobin A1c for diagnosis of diabetes in children. . According to American Diabetes Association (ADA) guidelines, hemoglobin A1c <7.0% represents optimal control in non-pregnant diabetic patients. Different metrics may apply to specific patient populations.  Standards of Medical Care in Diabetes(ADA). .          Passed - Cr in normal range and within 360 days    Creat  Date Value Ref Range Status  12/26/2023 0.74 0.70 - 1.35 mg/dL Final   Creatinine,U  Date Value Ref Range Status  11/14/2018 183.0 mg/dL Final   Creatinine, Urine  Date Value Ref Range Status  06/28/2023 91 20 - 320 mg/dL Final         Passed - Valid encounter within last 6 months    Recent Outpatient Visits           1 month ago Type 2 diabetes mellitus with  hyperglycemia, without long-term current use of insulin [facility])   [place] [facility] [place], Salvadore Oxford, NP   2 months ago Cervical radiculitis    [facility] [place], Salvadore Oxford, NP       Future Appointments             In 10 months Vanna Scotland, MD [facility]

## 2024-01-31 NOTE — Telephone Encounter (Signed)
 Requested medications are due for refill today.  yes  Requested medications are on the active medications list.  yes  Last refill. 10/25/2023 6mL 0 rf  Future visit scheduled.   yes  Notes to clinic.  Medication not assigned to a protocol. Please review for refill.    Requested Prescriptions  Pending Prescriptions Disp Refills   MOUNJARO 5 MG/0.5ML Pen [Pharmacy Med Name: tirzepatide (MOUNJARO) 5 MG/0.5ML Pen] 6 mL 0    Sig: Inject 5 mg into the skin once a week.     Off-Protocol Failed - 01/31/2024  1:54 PM      Failed - Medication not assigned to a protocol, review manually.      Passed - Valid encounter within last 12 months    Recent Outpatient Visits           1 month ago Type 2 diabetes mellitus with hyperglycemia, without long-term current use of insulin [facility])   [place] [facility] [place], Rankin Buzzard, NP   2 months ago Cervical radiculitis   [place] [facility] [place], Rankin Buzzard, NP       Future Appointments             In 10 months Dustin Gimenez, MD [facility] [place]            Refused Prescriptions Disp Refills   amLODipine (NORVASC) 10 MG tablet 90 tablet 0    Sig: Take 1 tablet (10 mg total) by mouth daily.     Cardiovascular: Calcium Channel Blockers 2 Passed - 01/31/2024  1:54 PM      Passed - Last BP in normal range    BP Readings from Last 1 Encounters:  12/29/23 134/82         Passed - Last Heart Rate in normal range    Pulse Readings from Last 1 Encounters:  12/27/23 91         Passed - Valid encounter within last 6 months    Recent Outpatient Visits           1 month ago Type 2 diabetes mellitus with hyperglycemia, without long-term current use of insulin [facility])   [place] [facility] [place], Rankin Buzzard, NP   2 months ago Cervical radiculitis    [facility] [place], Rankin Buzzard, NP       Future Appointments             In 10 months Dustin Gimenez, MD [facility] [place]             buPROPion (WELLBUTRIN XL) 300 MG 24 hr tablet 90 tablet 0    Sig: Take 1 tablet (300 mg total) by mouth daily.     Psychiatry: Antidepressants - bupropion Passed - 01/31/2024  1:54 PM      Passed - Cr in normal range and within 360 days    Creat  Date Value Ref Range Status  12/26/2023 0.74 0.70 - 1.35 mg/dL Final   Creatinine,U  Date Value Ref Range Status  11/14/2018 183.0 mg/dL Final   Creatinine, Urine  Date Value Ref Range Status  06/28/2023 91 20 - 320 mg/dL Final         Passed - AST in normal range and within 360 days    AST  Date Value Ref Range Status  12/26/2023 18 10 - 35 U/L Final         Passed - ALT in normal  range and within 360 days    ALT  Date Value Ref Range Status  12/26/2023 20 9 - 46 U/L Final         Passed - Completed PHQ-2 or PHQ-9 in the last 360 days      Passed - Last BP in normal range    BP Readings from Last 1 Encounters:  12/29/23 134/82         Passed - Valid encounter within last 6 months    Recent Outpatient Visits           1 month ago Type 2 diabetes mellitus with hyperglycemia, without long-term current use of insulin [facility])   [place] [facility] [place], Salvadore Oxford, NP   2 months ago Cervical radiculitis   [place] [facility] [place], Salvadore Oxford, NP       Future Appointments             In 10 months Vanna Scotland, MD [facility] [place]             citalopram (CELEXA) 40 MG tablet 90 tablet 0    Sig: Take 1 tablet (40 mg total) by mouth daily.     Psychiatry:  Antidepressants - SSRI Passed - 01/31/2024  1:54 PM      Passed - Completed PHQ-2 or PHQ-9 in the last 360 days      Passed - Valid encounter within last 6 months    Recent Outpatient Visits           1 month ago Type 2 diabetes mellitus with hyperglycemia, without long-term current use of insulin [facility])   [place] [facility]  [place], Salvadore Oxford, NP   2 months ago Cervical radiculitis   [place] [facility] [place], Salvadore Oxford, NP       Future Appointments             In 10 months Vanna Scotland, MD [facility] [place]             ezetimibe (ZETIA) 10 MG tablet 90 tablet 0    Sig: Take 1 tablet (10 mg total) by mouth daily.     Cardiovascular:  Antilipid - Sterol Transport Inhibitors Failed - 01/31/2024  1:54 PM      Failed - Lipid Panel in normal range within the last 12 months    Cholesterol, Total  Date Value Ref Range Status  10/26/2019 157 100 - 199 mg/dL Final   Cholesterol  Date Value Ref Range Status  12/26/2023 169 <200 mg/dL Final   LDL Cholesterol (Calc)  Date Value Ref Range Status  12/26/2023 84 mg/dL (calc) Final    Comment:    Reference range: <100 . Desirable range <100 mg/dL for primary prevention;   <70 mg/dL for patients with CHD or diabetic patients  with > or = 2 CHD risk factors. Marland Kitchen LDL-C is now calculated using the Martin-Hopkins  calculation, which is a validated novel method providing  better accuracy than the Friedewald equation in the  estimation of LDL-C.  Horald Pollen et al. Len[place] Ahr. 0454;098(11): 2061-2068  (http://education.[employer].com/faq/FAQ164)    Direct LDL  Date Value Ref Range Status  01/01/2021 127.0 mg/dL Final    Comment:    Optimal:  <100 mg/dLNear or Above Optimal:  100-129 mg/dLBorderline High:  130-159 mg/dLHigh:  160-189 mg/dLVery High:  >190 mg/dL   HDL  Date Value Ref Range Status  12/26/2023 55 > OR =  40 mg/dL Final  78/29/5621 48 >30 mg/dL Final   Triglycerides  Date Value Ref Range Status  12/26/2023 205 (H) <150 mg/dL Final    Comment:    . If a non-fasting specimen was collected, consider repeat triglyceride testing on a fasting specimen if clinically indicated.  Perry Mount et al. J. of Clin. Lipidol. 2015;9:129-169. Marland Kitchen          Passed - AST in normal range and within 360 days    AST  Date  Value Ref Range Status  12/26/2023 18 10 - 35 U/L Final         Passed - ALT in normal range and within 360 days    ALT  Date Value Ref Range Status  12/26/2023 20 9 - 46 U/L Final         Passed - Patient is not pregnant      Passed - Valid encounter within last 12 months    Recent Outpatient Visits           1 month ago Type 2 diabetes mellitus with hyperglycemia, without long-term current use of insulin [facility])   [place] [facility] [place], Salvadore Oxford, NP   2 months ago Cervical radiculitis   [facility] [place], Salvadore Oxford, NP       Future Appointments             In 10 months Vanna Scotland, MD [facility] [place]             glipiZIDE (GLUCOTROL XL) 10 MG 24 hr tablet 90 tablet 1    Sig: Take 1 tablet (10 mg total) by mouth daily with breakfast.     Endocrinology:  Diabetes - Sulfonylureas Passed - 01/31/2024  1:54 PM      Passed - HBA1C is between 0 and 7.9 and within 180 days    Hgb A1c MFr Bld  Date Value Ref Range Status  12/26/2023 5.4 <5.7 % of total Hgb Final    Comment:    For the purpose of screening for the presence of diabetes: . <5.7%       Consistent with the absence of diabetes 5.7-6.4%    Consistent with increased risk for diabetes             (prediabetes) > or =6.5%  Consistent with diabetes . This assay result is consistent with a decreased risk of diabetes. . Currently, no consensus exists regarding use of hemoglobin A1c for diagnosis of diabetes in children. . According to American Diabetes Association (ADA) guidelines, hemoglobin A1c <7.0% represents optimal control in non-pregnant diabetic patients. Different metrics may apply to specific patient populations.  Standards of Medical Care in Diabetes(ADA). .          Passed - Cr in normal range and within 360 days    Creat  Date Value Ref Range Status  12/26/2023 0.74 0.70 - 1.35 mg/dL Final   Creatinine,U  Date Value Ref  Range Status  11/14/2018 183.0 mg/dL Final   Creatinine, Urine  Date Value Ref Range Status  06/28/2023 91 20 - 320 mg/dL Final         Passed - Valid encounter within last 6 months    Recent Outpatient Visits           1 month ago Type 2 diabetes mellitus with hyperglycemia, without long-term current use of insulin [facility])   [place] [facility] [place], Salvadore Oxford, [place]  2 months ago Cervical radiculitis   [place] [facility] [place], Rankin Buzzard, NP       Future Appointments             In 10 months Dustin Gimenez, MD [facility]

## 2024-02-15 ENCOUNTER — Inpatient Hospital Stay: Payer: Commercial Managed Care - PPO | Attending: Radiation Oncology

## 2024-02-15 DIAGNOSIS — F411 Generalized anxiety disorder: Secondary | ICD-10-CM | POA: Diagnosis not present

## 2024-02-15 DIAGNOSIS — F331 Major depressive disorder, recurrent, moderate: Secondary | ICD-10-CM | POA: Diagnosis not present

## 2024-02-20 DIAGNOSIS — F331 Major depressive disorder, recurrent, moderate: Secondary | ICD-10-CM | POA: Diagnosis not present

## 2024-02-20 DIAGNOSIS — F411 Generalized anxiety disorder: Secondary | ICD-10-CM | POA: Diagnosis not present

## 2024-02-22 ENCOUNTER — Ambulatory Visit
Admission: RE | Admit: 2024-02-22 | Discharge: 2024-02-22 | Disposition: A | Discharge status: Home / Self Care | Payer: Commercial Managed Care - PPO | Source: Ambulatory Visit | Attending: Radiation Oncology | Admitting: Radiation Oncology

## 2024-02-22 ENCOUNTER — Encounter: Payer: Self-pay | Admitting: Radiation Oncology

## 2024-02-22 ENCOUNTER — Inpatient Hospital Stay

## 2024-02-22 VITALS — BP 126/87 | HR 93 | Temp 97.7°F | Resp 16 | Wt 219.0 lb

## 2024-02-22 DIAGNOSIS — F411 Generalized anxiety disorder: Secondary | ICD-10-CM | POA: Diagnosis not present

## 2024-02-22 DIAGNOSIS — Z923 Personal history of irradiation: Secondary | ICD-10-CM | POA: Insufficient documentation

## 2024-02-22 DIAGNOSIS — G4733 Obstructive sleep apnea (adult) (pediatric): Secondary | ICD-10-CM | POA: Diagnosis not present

## 2024-02-22 DIAGNOSIS — C61 Malignant neoplasm of prostate: Secondary | ICD-10-CM | POA: Insufficient documentation

## 2024-02-22 DIAGNOSIS — F331 Major depressive disorder, recurrent, moderate: Secondary | ICD-10-CM | POA: Diagnosis not present

## 2024-02-22 DIAGNOSIS — Z191 Hormone sensitive malignancy status: Secondary | ICD-10-CM | POA: Diagnosis not present

## 2024-02-22 NOTE — Progress Notes (Signed)
 Radiation Oncology Follow up Note  Name: Marc Aguilar   Date:   02/22/2024 MRN:  [id number] DOB: October 10, 1961    This 63 y.o. 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  Prostatic fossa and pelvic nodes for a Gleason 7 (4+3) adenocarcinoma status post prostatectomy with biochemical failure seen today in routine follow-up he is doing well specifically denies any urinary incontinence any increased lower urinary tract symptoms or diarrhea.  His most recent PSA is less than 0.01 showing excellent biochemical control of his disease.  COMPLICATIONS OF TREATMENT: none  FOLLOW UP COMPLIANCE: keeps appointments   PHYSICAL EXAM:  BP 126/87   Pulse 93   Temp 97.7 F (36.5 C) (Tympanic)   Resp 16   Wt 219 lb (99.3 kg)   BMI 30.54 kg/m  Well-developed well-nourished patient in NAD. HEENT reveals PERLA, EOMI, discs not visualized.  Oral cavity is clear. No oral mucosal lesions are identified. Neck is clear without evidence of cervical or supraclavicular adenopathy. Lungs are clear to A&P. Cardiac examination is essentially unremarkable with regular rate and rhythm without murmur rub or thrill. Abdomen is benign with no organomegaly or masses noted. Motor sensory and DTR levels are equal and symmetric in the upper and lower extremities. Cranial nerves II through XII are grossly intact. Proprioception is intact. No peripheral adenopathy or edema is identified. No motor or sensory levels are noted. Crude visual fields are within normal range.  RADIOLOGY RESULTS: No current films to review  PLAN: At the present time patient is under  excellent biochemical control of his prostate cancer.  I am pleased with his overall progress.  I have asked to see him back in 6 months with a follow-up PSA.  Patient knows to call with any concerns.  I would like to take this opportunity to thank you for allowing me to participate in the care of your patient.Glenis Langdon, MD

## 2024-02-27 DIAGNOSIS — F411 Generalized anxiety disorder: Secondary | ICD-10-CM | POA: Diagnosis not present

## 2024-02-27 DIAGNOSIS — F331 Major depressive disorder, recurrent, moderate: Secondary | ICD-10-CM | POA: Diagnosis not present

## 2024-02-29 DIAGNOSIS — F411 Generalized anxiety disorder: Secondary | ICD-10-CM | POA: Diagnosis not present

## 2024-02-29 DIAGNOSIS — F331 Major depressive disorder, recurrent, moderate: Secondary | ICD-10-CM | POA: Diagnosis not present

## 2024-03-05 DIAGNOSIS — F331 Major depressive disorder, recurrent, moderate: Secondary | ICD-10-CM | POA: Diagnosis not present

## 2024-03-05 DIAGNOSIS — F411 Generalized anxiety disorder: Secondary | ICD-10-CM | POA: Diagnosis not present

## 2024-04-09 ENCOUNTER — Ambulatory Visit: Admitting: Internal Medicine

## 2024-04-09 ENCOUNTER — Other Ambulatory Visit: Payer: Self-pay | Admitting: Physician Assistant

## 2024-04-09 ENCOUNTER — Other Ambulatory Visit: Payer: Self-pay

## 2024-04-09 ENCOUNTER — Other Ambulatory Visit: Payer: Self-pay | Admitting: Internal Medicine

## 2024-04-09 ENCOUNTER — Other Ambulatory Visit: Payer: Self-pay | Admitting: Family

## 2024-04-09 ENCOUNTER — Encounter: Payer: Self-pay | Admitting: Internal Medicine

## 2024-04-09 VITALS — BP 132/78 | Ht 71.0 in | Wt 222.8 lb

## 2024-04-09 DIAGNOSIS — E1165 Type 2 diabetes mellitus with hyperglycemia: Secondary | ICD-10-CM

## 2024-04-09 DIAGNOSIS — Z1211 Encounter for screening for malignant neoplasm of colon: Secondary | ICD-10-CM | POA: Diagnosis not present

## 2024-04-09 DIAGNOSIS — R040 Epistaxis: Secondary | ICD-10-CM

## 2024-04-09 DIAGNOSIS — Z7985 Long-term (current) use of injectable non-insulin antidiabetic drugs: Secondary | ICD-10-CM

## 2024-04-09 DIAGNOSIS — Z23 Encounter for immunization: Secondary | ICD-10-CM

## 2024-04-09 DIAGNOSIS — Z7984 Long term (current) use of oral hypoglycemic drugs: Secondary | ICD-10-CM

## 2024-04-09 DIAGNOSIS — Z0001 Encounter for general adult medical examination with abnormal findings: Secondary | ICD-10-CM | POA: Diagnosis not present

## 2024-04-09 MED ORDER — PANTOPRAZOLE SODIUM 40 MG PO TBEC
40.0000 mg | DELAYED_RELEASE_TABLET | Freq: Every day | ORAL | 1 refills | Status: DC
  Filled 2024-04-09: qty 90, 90d supply, fill #0
  Filled 2024-05-24 – 2024-08-24 (×2): qty 90, 90d supply, fill #1

## 2024-04-09 MED FILL — Celecoxib Cap 200 MG: ORAL | 14 days supply | Qty: 28 | Fill #0 | Status: AC

## 2024-04-09 MED FILL — Methocarbamol Tab 500 MG: ORAL | 10 days supply | Qty: 30 | Fill #0 | Status: AC

## 2024-04-09 NOTE — Telephone Encounter (Signed)
 Medications were last filled in April. Patient should have been due for refill in May. Need to check in with the patient to assess for new or worsening symptoms that is prompting restarting these meds.

## 2024-04-09 NOTE — Progress Notes (Signed)
 Subjective:    Patient ID: Marc Aguilar, male    DOB: 08-14-1961, 63 y.o.   MRN: [id number]  HPI  Patient presents to clinic today for his annual exam.   Flu: 06/2023 Tetanus: 10/2016 COVID: Pfizer x 3 Pneumovax: 10/2018 Shingrix: Never PSA screening: 01/2023 Colon screening: 03/2014 Vision screening: annually Dentist: biannually  Diet: He does eat meat. He consumes fruits and veggies. He tries to avoid fried foods. He drinks mostly beer. Exercise: None  Review of Systems     Past Medical History:  Diagnosis Date   Anxiety    Arthritis    knees and hips (02/28/2018)   Cervical radiculitis    Chicken pox    Depression    GERD (gastroesophageal reflux disease)    Heart murmur    Hernia, abdominal    High cholesterol    History of gout    History of hiatal hernia    Hyperlipidemia associated with type 2 diabetes mellitus (HCC)    Hypertension    Migraine    probably monthly (02/28/2018)   Motion sickness    ocean boats, circular motion   Neck pain    OSA on CPAP    Osteoarthritis    Prostate cancer (HCC) 2021   surgery and radiation treatments   Refusal of blood transfusions as patient is Jehovah's Witness    Seasonal allergies    Type II diabetes mellitus (HCC)     Current Outpatient Medications  Medication Sig Dispense Refill   Alcohol  Swabs (ALCOHOL  PREP) PADS 1 each by Does not apply route 2 (two) times daily. 200 each 3   amLODipine  (NORVASC ) 10 MG tablet Take 1 tablet (10 mg total) by mouth daily. 90 tablet 0   aspirin  325 MG tablet Take 325 mg by mouth daily.     atorvastatin  (LIPITOR ) 20 MG tablet Take 1 tablet (20 mg total) by mouth daily. 90 tablet 0   Blood Glucose Monitoring Suppl (BLOOD GLUCOSE MONITOR SYSTEM) w/Device KIT Use as directed to check blood sugar once daily. 1 kit 0   buPROPion  (WELLBUTRIN  XL) 300 MG 24 hr tablet Take 1 tablet (300 mg total) by mouth daily. 90 tablet 0   celecoxib  (CELEBREX ) 200 MG capsule Take 1 capsule (200 mg  total) by mouth every 12 (twelve) hours. 28 capsule 0   citalopram  (CELEXA ) 40 MG tablet Take 1 tablet (40 mg total) by mouth daily. 90 tablet 0   ezetimibe  (ZETIA ) 10 MG tablet Take 1 tablet (10 mg total) by mouth daily. 90 tablet 0   glipiZIDE  (GLUCOTROL  XL) 10 MG 24 hr tablet Take 1 tablet (10 mg total) by mouth daily with breakfast. 90 tablet 1   Glucose Blood (BLOOD GLUCOSE TEST STRIPS) STRP Use to check blood sugar once a day. 100 strip 1   Lancets (FREESTYLE) lancets Use as directed to check blood sugar once daily. 100 each 1   methocarbamol  (ROBAXIN ) 500 MG tablet Take 1 tablet (500 mg total) by mouth every 8 (eight) hours as needed for muscle spasms. 30 tablet 0   pantoprazole  (PROTONIX ) 40 MG tablet Take 1 tablet (40 mg total) by mouth daily.     tadalafil  (CIALIS ) 20 MG tablet Take 1 tablet (20 mg total) by mouth daily as needed for erectile dysfunction. 30 tablet 11   tirzepatide  (MOUNJARO ) 5 MG/0.5ML Pen Inject 5 mg into the skin once a week. 6 mL 0   traZODone  (DESYREL ) 100 MG tablet Take 2 tablets (200 mg total) by  mouth at bedtime. 180 tablet 1   No current facility-administered medications for this visit.    Allergies  Allergen Reactions   Tape Rash    Paper tape-blisters    Family History  Problem Relation Age of Onset   Diabetes Mother    Hyperlipidemia Mother    Bladder Cancer Father    Liver cancer Sister    Diabetes Sister    Diabetes Maternal Aunt    Arthritis Maternal Grandmother    Diabetes Maternal Grandmother    Arthritis Maternal Grandfather    Arthritis Paternal Grandmother    Diabetes Brother    Heart disease Brother    Colon cancer Neg Hx     Social History   Socioeconomic History   Marital status: Married    Spouse name: Nena   Number of children: 2   Years of education: Not on file   Highest education level: Bachelor's degree (e.g., BA, AB, BS)  Occupational History   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  Tobacco Use   Smoking status: Former     Current packs/day: 0.00    Average packs/day: 1 pack/day for 27.0 years (27.0 ttl pk-yrs)    Types: Cigarettes    Start date: 10/18/1982    Quit date: 10/18/2009    Years since quitting: 14.4   Smokeless tobacco: Former    Types: Chew    Quit date: 10/18/1994  Vaping Use   Vaping status: Never Used  Substance and Sexual Activity   Alcohol  use: Yes    Alcohol /week: 21.0 standard drinks of alcohol     Types: 21 Cans of beer per week    Comment: 6 beers daily   Drug use: Never   Sexual activity: Not on file  Other Topics Concern   Not on file  Social History Narrative   Not on file   Social Drivers of Health   Financial Resource Strain: Low Risk  (12/26/2023)   Overall Financial Resource Strain (CARDIA)    Difficulty of Paying Living Expenses: Not very hard  Food Insecurity: No Food Insecurity (12/26/2023)   Hunger Vital Sign    Worried About Running Out of Food in the Last Year: Never true    Ran Out of Food in the Last Year: Never true  Transportation Needs: No Transportation Needs (12/26/2023)   PRAPARE - Administrator, Civil Service (Medical): No    Lack of Transportation (Non-Medical): No  Physical Activity: Inactive (12/26/2023)   Exercise Vital Sign    Days of Exercise per Week: 0 days    Minutes of Exercise per Session: 150+ min  Stress: Stress Concern Present (12/26/2023)   [employer] of Occupational Health - Occupational Stress Questionnaire    Feeling of Stress : Very much  Social Connections: Moderately Isolated (12/26/2023)   Social Connection and Isolation Panel    Frequency of Communication with Friends and Family: Once a week    Frequency of Social Gatherings with Friends and Family: Never    Attends Religious Services: 1 to 4 times per year    Active Member of [employer] or Organizations: No    Attends Banker Meetings: Not on file    Marital Status: Married  Catering manager Violence: Not At Risk (10/10/2023)   Humiliation, Afraid,  Rape, and Kick questionnaire    Fear of Current or Ex-Partner: No    Emotionally Abused: No    Physically Abused: No    Sexually Abused: No     Constitutional: Patient reports intermittent  headaches.  Denies fever, malaise, fatigue, or abrupt weight changes.  HEENT: Pt reports nose bleed this morning. Denies eye pain, eye redness, ear pain, ringing in the ears, wax buildup, runny nose, nasal congestion, or sore throat. Respiratory: Denies difficulty breathing, shortness of breath, cough or sputum production.   Cardiovascular: Denies chest pain, chest tightness, palpitations or swelling in the hands or feet.  Gastrointestinal: Denies abdominal pain, bloating, constipation, diarrhea or blood in the stool.  GU: Denies urgency, frequency, pain with urination, burning sensation, blood in urine, odor or discharge. Musculoskeletal: Patient reports chronic neck pain.  Denies decrease in range of motion, difficulty with gait, muscle pain or joint swelling.  Skin: Denies redness, rashes, lesions or ulcercations.  Neurological: Patient reports paresthesias of upper extremities, insomnia.  Denies dizziness, difficulty with memory, difficulty with speech or problems with balance and coordination.  Psych: Patient has a history of anxiety and depression.  Denies SI/HI.  No other specific complaints in a complete review of systems (except as listed in HPI above).  Objective:   Physical Exam  BP 132/78 (BP Location: Left Arm, Patient Position: Sitting, Cuff Size: Normal)   Ht 5' 11 (1.803 m)   Wt 222 lb 12.8 oz (101.1 kg)   BMI 31.07 kg/m   Wt Readings from Last 3 Encounters:  04/09/24 222 lb 12.8 oz (101.1 kg)  02/22/24 219 lb (99.3 kg)  12/29/23 213 lb (96.6 kg)    General: Appears his stated age, obese, in NAD. Skin: Warm, dry and intact. No ulcerations noted. HEENT: Head: normal shape and size; Eyes: sclera white, no icterus, conjunctiva pink, PERRLA and EOMs intact; Nose: bilateral nares  dry, turbinates swollen, no active bleeding noted within the nostrils. Neck:  Neck supple, trachea midline. No masses, lumps or thyromegaly present.  Cardiovascular: Normal rate and rhythm. S1,S2 noted.  No murmur, rubs or gallops noted. No JVD or BLE edema. No carotid bruits noted. Pulmonary/Chest: Normal effort and positive vesicular breath sounds. No respiratory distress. No wheezes, rales or ronchi noted.  Abdomen: Normal bowel sounds.  Musculoskeletal: Normal flexion, extension and rotation of the cervical spine.  Strength 5/5 BUE/BLE. No difficulty with gait.  Neurological: Alert and oriented. Cranial nerves II-XII grossly intact. Coordination normal.  Psychiatric: Mood and affect normal. Behavior is normal. Judgment and thought content normal.     BMET    Component Value Date/Time   NA 138 12/26/2023 0951   K 4.1 12/26/2023 0951   CL 106 12/26/2023 0951   CO2 24 12/26/2023 0951   GLUCOSE 214 (H) 12/26/2023 0951   BUN 15 12/26/2023 0951   CREATININE 0.74 12/26/2023 0951   CALCIUM  9.4 12/26/2023 0951   GFRNONAA >60 09/27/2023 0949   GFRAA >60 11/20/2019 0445    Lipid Panel     Component Value Date/Time   CHOL 169 12/26/2023 0951   CHOL 157 10/26/2019 1203   TRIG 205 (H) 12/26/2023 0951   HDL 55 12/26/2023 0951   HDL 48 10/26/2019 1203   CHOLHDL 3.1 12/26/2023 0951   VLDL 60.2 (H) 01/01/2021 1047   LDLCALC 84 12/26/2023 0951    CBC    Component Value Date/Time   WBC 2.7 (L) 12/26/2023 0951   RBC 4.32 12/26/2023 0951   HGB 11.8 (L) 12/26/2023 0951   HGB 11.7 (L) 10/03/2023 0812   HCT 36.8 (L) 12/26/2023 0951   PLT 255 12/26/2023 0951   PLT 226 10/03/2023 0812   MCV 85.2 12/26/2023 0951   MCH 27.3 12/26/2023 0951  MCHC 32.1 12/26/2023 0951   RDW 13.7 12/26/2023 0951   LYMPHSABS 1.9 05/06/2023 2049   MONOABS 0.5 05/06/2023 2049   EOSABS 0.1 05/06/2023 2049   BASOSABS 0.0 05/06/2023 2049    Hgb A1C Lab Results  Component Value Date   HGBA1C 5.4  12/26/2023            Assessment & Plan:   Preventative Health Maintenance:  Encouraged him to get a flu shot in the fall Tetanus UTD Encouraged him to get his COVID booster Pneumovax UTD Prevnar 20 today Shingrix No. 1 today Referral to GI for screening colonoscopy. Encouraged him to consume a balanced diet and exercise regimen Advised him to see an eye doctor and dentist annually Will check CBC, CMET, Lipid, A1C and urine microalbumin today PSA checked by urology  Bloody nose:  I think this is due to the fact that his nasal passages are very dry Encouraged him to get a coolmist humidifier in his home Recommend nasal saline 3 times daily as needed  RTC in 6 months, follow up chronic conditions Angeline Laura, NP

## 2024-04-09 NOTE — Patient Instructions (Signed)
 Health Maintenance, Male  Adopting a healthy lifestyle and getting preventive care are important in promoting health and wellness. Ask your health care provider about:  The right schedule for you to have regular tests and exams.  Things you can do on your own to prevent diseases and keep yourself healthy.  What should I know about diet, weight, and exercise?  Eat a healthy diet    Eat a diet that includes plenty of vegetables, fruits, low-fat dairy products, and lean protein.  Do not eat a lot of foods that are high in solid fats, added sugars, or sodium.  Maintain a healthy weight  Body mass index (BMI) is a measurement that can be used to identify possible weight problems. It estimates body fat based on height and weight. Your health care provider can help determine your BMI and help you achieve or maintain a healthy weight.  Get regular exercise  Get regular exercise. This is one of the most important things you can do for your health. Most adults should:  Exercise for at least 150 minutes each week. The exercise should increase your heart rate and make you sweat (moderate-intensity exercise).  Do strengthening exercises at least twice a week. This is in addition to the moderate-intensity exercise.  Spend less time sitting. Even light physical activity can be beneficial.  Watch cholesterol and blood lipids  Have your blood tested for lipids and cholesterol at 61 years of age, then have this test every 5 years.  You may need to have your cholesterol levels checked more often if:  Your lipid or cholesterol levels are high.  You are older than 61 years of age.  You are at high risk for heart disease.  What should I know about cancer screening?  Many types of cancers can be detected early and may often be prevented. Depending on your health history and family history, you may need to have cancer screening at various ages. This may include screening for:  Colorectal cancer.  Prostate cancer.  Skin cancer.  Lung  cancer.  What should I know about heart disease, diabetes, and high blood pressure?  Blood pressure and heart disease  High blood pressure causes heart disease and increases the risk of stroke. This is more likely to develop in people who have high blood pressure readings or are overweight.  Talk with your health care provider about your target blood pressure readings.  Have your blood pressure checked:  Every 3-5 years if you are 9-95 years of age.  Every year if you are 85 years old or older.  If you are between the ages of 29 and 29 and are a current or former smoker, ask your health care provider if you should have a one-time screening for abdominal aortic aneurysm (AAA).  Diabetes  Have regular diabetes screenings. This checks your fasting blood sugar level. Have the screening done:  Once every three years after age 23 if you are at a normal weight and have a low risk for diabetes.  More often and at a younger age if you are overweight or have a high risk for diabetes.  What should I know about preventing infection?  Hepatitis B  If you have a higher risk for hepatitis B, you should be screened for this virus. Talk with your health care provider to find out if you are at risk for hepatitis B infection.  Hepatitis C  Blood testing is recommended for:  Everyone born from [birth year] through 1963.  Anyone  with known risk factors for hepatitis C.  Sexually transmitted infections (STIs)  You should be screened each year for STIs, including gonorrhea and chlamydia, if:  You are sexually active and are younger than 62 years of age.  You are older than 61 years of age and your health care provider tells you that you are at risk for this type of infection.  Your sexual activity has changed since you were last screened, and you are at increased risk for chlamydia or gonorrhea. Ask your health care provider if you are at risk.  Ask your health care provider about whether you are at high risk for HIV. Your health care provider  may recommend a prescription medicine to help prevent HIV infection. If you choose to take medicine to prevent HIV, you should first get tested for HIV. You should then be tested every 3 months for as long as you are taking the medicine.  Follow these instructions at home:  Alcohol use  Do not drink alcohol if your health care provider tells you not to drink.  If you drink alcohol:  Limit how much you have to 0-2 drinks a day.  Know how much alcohol is in your drink. In the [place] one drink equals one 12 oz bottle of beer (355 mL), one 5 oz glass of wine (148 mL), or one 1 oz glass of hard liquor (44 mL).  Lifestyle  Do not use any products that contain nicotine or tobacco. These products include cigarettes, chewing tobacco, and vaping devices, such as e-cigarettes. If you need help quitting, ask your health care provider.  Do not use street drugs.  Do not share needles.  Ask your health care provider for help if you need support or information about quitting drugs.  General instructions  Schedule regular health, dental, and eye exams.  Stay current with your vaccines.  Tell your health care provider if:  You often feel depressed.  You have ever been abused or do not feel safe at home.  Summary  Adopting a healthy lifestyle and getting preventive care are important in promoting health and wellness.  Follow your health care provider's instructions about healthy diet, exercising, and getting tested or screened for diseases.  Follow your health care provider's instructions on monitoring your cholesterol and blood pressure.  This information is not intended to replace advice given to you by your health care provider. Make sure you discuss any questions you have with your health care provider.  Document Revised: 02/23/2021 Document Reviewed: 02/23/2021  Elsevier Patient Education  2024 [employer].

## 2024-04-10 ENCOUNTER — Other Ambulatory Visit: Payer: Self-pay

## 2024-04-10 ENCOUNTER — Ambulatory Visit: Payer: Self-pay | Admitting: Internal Medicine

## 2024-04-10 ENCOUNTER — Telehealth: Payer: Self-pay

## 2024-04-10 DIAGNOSIS — D72819 Decreased white blood cell count, unspecified: Secondary | ICD-10-CM

## 2024-04-10 LAB — MICROALBUMIN / CREATININE URINE RATIO
Creatinine, Urine: 89 mg/dL (ref 20–320)
Microalb Creat Ratio: 2 mg/g{creat} (ref <30)
Microalb, Ur: 0.2 mg/dL

## 2024-04-10 LAB — COMPREHENSIVE METABOLIC PANEL WITH GFR
AG Ratio: 2 (calc) (ref 1.0–2.5)
ALT: 16 U/L (ref 9–46)
AST: 16 U/L (ref 10–35)
Albumin: 4.4 g/dL (ref 3.6–5.1)
Alkaline phosphatase (APISO): 79 U/L (ref 35–144)
BUN: 19 mg/dL (ref 7–25)
CO2: 25 mmol/L (ref 20–32)
Calcium: 9.4 mg/dL (ref 8.6–10.3)
Chloride: 106 mmol/L (ref 98–110)
Creat: 0.75 mg/dL (ref 0.70–1.35)
Globulin: 2.2 g/dL (ref 1.9–3.7)
Glucose, Bld: 140 mg/dL — ABNORMAL HIGH (ref 65–99)
Potassium: 4.4 mmol/L (ref 3.5–5.3)
Sodium: 140 mmol/L (ref 135–146)
Total Bilirubin: 0.4 mg/dL (ref 0.2–1.2)
Total Protein: 6.6 g/dL (ref 6.1–8.1)
eGFR: 103 mL/min/{1.73_m2} (ref >=60)

## 2024-04-10 LAB — CBC
HCT: 41.9 % (ref 38.5–50.0)
Hemoglobin: 13 g/dL — ABNORMAL LOW (ref 13.2–17.1)
MCH: 26.5 pg — ABNORMAL LOW (ref 27.0–33.0)
MCHC: 31 g/dL — ABNORMAL LOW (ref 32.0–36.0)
MCV: 85.5 fL (ref 80.0–100.0)
MPV: 9.6 fL (ref 7.5–12.5)
Platelets: 235 10*3/uL (ref 140–400)
RBC: 4.9 10*6/uL (ref 4.20–5.80)
RDW: 14.6 % (ref 11.0–15.0)
WBC: 2.8 10*3/uL — ABNORMAL LOW (ref 3.8–10.8)

## 2024-04-10 LAB — HEMOGLOBIN A1C
Hgb A1c MFr Bld: 5.7 % — ABNORMAL HIGH (ref <5.7)
Mean Plasma Glucose: 117 mg/dL
eAG (mmol/L): 6.5 mmol/L

## 2024-04-10 LAB — LIPID PANEL
Cholesterol: 178 mg/dL (ref <200)
HDL: 50 mg/dL (ref >=40)
LDL Cholesterol (Calc): 90 mg/dL
Non-HDL Cholesterol (Calc): 128 mg/dL (ref <130)
Total CHOL/HDL Ratio: 3.6 (calc) (ref <5.0)
Triglycerides: 319 mg/dL — ABNORMAL HIGH (ref <150)

## 2024-04-10 MED ORDER — ATORVASTATIN CALCIUM 40 MG PO TABS
40.0000 mg | ORAL_TABLET | Freq: Every day | ORAL | 1 refills | Status: AC
  Filled 2024-04-10: qty 90, 90d supply, fill #0
  Filled 2024-08-24: qty 90, 90d supply, fill #1

## 2024-04-10 MED ORDER — TIRZEPATIDE 7.5 MG/0.5ML ~~LOC~~ SOAJ
7.5000 mg | SUBCUTANEOUS | 1 refills | Status: DC
  Filled 2024-04-10 – 2024-04-11 (×3): qty 2, 28d supply, fill #0
  Filled 2024-05-24: qty 2, 28d supply, fill #1
  Filled 2024-06-19: qty 2, 28d supply, fill #2
  Filled 2024-07-30: qty 2, 28d supply, fill #3
  Filled 2024-08-24: qty 2, 28d supply, fill #4
  Filled 2024-09-24: qty 2, 28d supply, fill #5

## 2024-04-10 NOTE — Telephone Encounter (Signed)
 Pharmacy Patient Advocate Encounter   Received notification from CoverMyMeds that prior authorization for Mounjaro  7.5MG /0.5ML auto-injectors is required/requested.   Insurance verification completed.   The patient is insured through [facility] .   Per test claim: PA required; PA submitted to above mentioned insurance via CoverMyMeds Key/confirmation #/[id number] [facility] Status is pending

## 2024-04-11 ENCOUNTER — Other Ambulatory Visit: Payer: Self-pay

## 2024-04-11 MED FILL — Citalopram Hydrobromide Tab 40 MG (Base Equiv): ORAL | 90 days supply | Qty: 90 | Fill #0 | Status: AC

## 2024-04-11 MED FILL — Glipizide Tab ER 24HR 10 MG: ORAL | 90 days supply | Qty: 90 | Fill #0 | Status: AC

## 2024-04-11 MED FILL — Amlodipine Besylate Tab 10 MG (Base Equivalent): ORAL | 90 days supply | Qty: 90 | Fill #0 | Status: AC

## 2024-04-11 MED FILL — Bupropion HCl Tab ER 24HR 300 MG: ORAL | 90 days supply | Qty: 90 | Fill #0 | Status: AC

## 2024-04-11 MED FILL — Ezetimibe Tab 10 MG: ORAL | 90 days supply | Qty: 90 | Fill #0 | Status: AC

## 2024-04-11 NOTE — Telephone Encounter (Signed)
 Requested Prescriptions  Pending Prescriptions Disp Refills   amLODipine  (NORVASC ) 10 MG tablet 90 tablet 0    Sig: Take 1 tablet (10 mg total) by mouth daily.     Cardiovascular: Calcium  Channel Blockers 2 Passed - 04/11/2024  8:32 AM      Passed - Last BP in normal range    BP Readings from Last 1 Encounters:  04/09/24 132/78         Passed - Last Heart Rate in normal range    Pulse Readings from Last 1 Encounters:  02/22/24 93         Passed - Valid encounter within last 6 months    Recent Outpatient Visits           2 days ago Encounter for general adult medical examination with abnormal findings   [place] [facility] [place], [place] W, NP   3 months ago Type 2 diabetes mellitus with hyperglycemia, without long-term current use of insulin  [facility])   [facility] [place], Angeline ORN, NP   4 months ago Cervical radiculitis   [place] [facility] [place], Angeline ORN, NP       Future Appointments             In 8 months Penne Knee, MD [facility] [place]             buPROPion  (WELLBUTRIN  XL) 300 MG 24 hr tablet 90 tablet 0    Sig: Take 1 tablet (300 mg total) by mouth daily.     Psychiatry: Antidepressants - bupropion  Passed - 04/11/2024  8:32 AM      Passed - Cr in normal range and within 360 days    Creat  Date Value Ref Range Status  04/09/2024 0.75 0.70 - 1.35 mg/dL Final   Creatinine,U  Date Value Ref Range Status  11/14/2018 183.0 mg/dL Final   Creatinine, Urine  Date Value Ref Range Status  04/09/2024 89 20 - 320 mg/dL Final         Passed - AST in normal range and within 360 days    AST  Date Value Ref Range Status  04/09/2024 16 10 - 35 U/L Final         Passed - ALT in normal range and within 360 days    ALT  Date Value Ref Range Status  04/09/2024 16 9 - 46 U/L Final         Passed - Completed PHQ-2 or PHQ-9 in the last 360 days      Passed - Last BP in normal range     BP Readings from Last 1 Encounters:  04/09/24 132/78         Passed - Valid encounter within last 6 months    Recent Outpatient Visits           2 days ago Encounter for general adult medical examination with abnormal findings   [place] [facility] [place], [place] W, NP   3 months ago Type 2 diabetes mellitus with hyperglycemia, without long-term current use of insulin  [facility])   [facility] [place], Angeline ORN, NP   4 months ago Cervical radiculitis   [place] [place], Angeline ORN, NP       Future Appointments             In 8 months Penne Knee, MD [facility]  citalopram  (CELEXA ) 40 MG tablet 90 tablet 0    Sig: Take 1 tablet (40 mg total) by mouth daily.     Psychiatry:  Antidepressants - SSRI Passed - 04/11/2024  8:32 AM      Passed - Completed PHQ-2 or PHQ-9 in the last 360 days      Passed - Valid encounter within last 6 months    Recent Outpatient Visits           2 days ago Encounter for general adult medical examination with abnormal findings   [place] [facility] [place], [place] W, NP   3 months ago Type 2 diabetes mellitus with hyperglycemia, without long-term current use of insulin  [facility])   [place] [facility] [place], Angeline ORN, NP   4 months ago Cervical radiculitis   [facility] [place], Angeline ORN, NP       Future Appointments             In 8 months Penne Knee, MD [facility] [place]             ezetimibe  (ZETIA ) 10 MG tablet 90 tablet 0    Sig: Take 1 tablet (10 mg total) by mouth daily.     Cardiovascular:  Antilipid - Sterol Transport Inhibitors Failed - 04/11/2024  8:32 AM      Failed - Lipid Panel in normal range within the last 12 months    Cholesterol, Total  Date Value Ref Range Status  10/26/2019 157 100 - 199 mg/dL Final   Cholesterol  Date Value Ref  Range Status  04/09/2024 178 <200 mg/dL Final   LDL Cholesterol (Calc)  Date Value Ref Range Status  04/09/2024 90 mg/dL (calc) Final    Comment:    Reference range: <100 . Desirable range <100 mg/dL for primary prevention;   <70 mg/dL for patients with CHD or diabetic patients  with > or = 2 CHD risk factors. [facility] LDL-C is now calculated using the Martin-Hopkins  calculation, which is a validated novel method providing  better accuracy than the Friedewald equation in the  estimation of LDL-C.  Gladis APPLETHWAITE et al. SANDREA. 7986;689(80): 2061-2068  (http://education.Quest[employer].com/faq/FAQ164)    Direct LDL  Date Value Ref Range Status  01/01/2021 127.0 mg/dL Final    Comment:    Optimal:  <100 mg/dLNear or Above Optimal:  100-129 mg/dLBorderline High:  130-159 mg/dLHigh:  160-189 mg/dLVery High:  >190 mg/dL   HDL  Date Value Ref Range Status  04/09/2024 50 > OR = 40 mg/dL Final  98/91/7978 48 >60 mg/dL Final   Triglycerides  Date Value Ref Range Status  04/09/2024 319 (H) <150 mg/dL Final    Comment:    . If a non-fasting specimen was collected, consider repeat triglyceride testing on a fasting specimen if clinically indicated.  Veatrice et al. J. of Clin. Lipidol. 2015;9:129-169. SABRA          Passed - AST in normal range and within 360 days    AST  Date Value Ref Range Status  04/09/2024 16 10 - 35 U/L Final         Passed - ALT in normal range and within 360 days    ALT  Date Value Ref Range Status  04/09/2024 16 9 - 46 U/L Final         Passed - Patient is not pregnant      Passed - Valid encounter within last 12 months  Recent Outpatient Visits           2 days ago Encounter for general adult medical examination with abnormal findings   [place] [facility] [place], Angeline ORN, NP   3 months ago Type 2 diabetes mellitus with hyperglycemia, without long-term current use of insulin  [facility])   [place] [facility],  Angeline ORN, NP   4 months ago Cervical radiculitis   [place] [facility] [place], Angeline ORN, NP       Future Appointments             In 8 months Penne Knee, MD [facility] [place]             glipiZIDE  (GLUCOTROL  XL) 10 MG 24 hr tablet 90 tablet 0    Sig: Take 1 tablet (10 mg total) by mouth daily with breakfast.     Endocrinology:  Diabetes - Sulfonylureas Passed - 04/11/2024  8:32 AM      Passed - HBA1C is between 0 and 7.9 and within 180 days    Hgb A1c MFr Bld  Date Value Ref Range Status  04/09/2024 5.7 (H) <5.7 % Final    Comment:    For someone without known diabetes, a hemoglobin  A1c value between 5.7% and 6.4% is consistent with prediabetes and should be confirmed with a  follow-up test. . For someone with known diabetes, a value <7% indicates that their diabetes is well controlled. A1c targets should be individualized based on duration of diabetes, age, comorbid conditions, and other considerations. . This assay result is consistent with an increased risk of diabetes. . Currently, no consensus exists regarding use of hemoglobin A1c for diagnosis of diabetes for children. .          Passed - Cr in normal range and within 360 days    Creat  Date Value Ref Range Status  04/09/2024 0.75 0.70 - 1.35 mg/dL Final   Creatinine,U  Date Value Ref Range Status  11/14/2018 183.0 mg/dL Final   Creatinine, Urine  Date Value Ref Range Status  04/09/2024 89 20 - 320 mg/dL Final         Passed - Valid encounter within last 6 months    Recent Outpatient Visits           2 days ago Encounter for general adult medical examination with abnormal findings   [place] [place], [place] W, NP   3 months ago Type 2 diabetes mellitus with hyperglycemia, without long-term current use of insulin  [facility])   [place] [facility] [place], Angeline ORN, NP   4 months ago Cervical radiculitis   [facility] [place], Angeline ORN, NP       Future Appointments             In 8 months Penne Knee, MD [facility] [place]

## 2024-04-12 ENCOUNTER — Other Ambulatory Visit (HOSPITAL_COMMUNITY): Payer: Self-pay

## 2024-04-12 NOTE — Telephone Encounter (Signed)
 Pharmacy Patient Advocate Encounter  Received notification from [facility] that Prior Authorization for Mounjaro  7.5mg /0.42ml has been APPROVED from 04/10/2024 to 04/10/2025   PA #/Case ID/Reference #: [id number]

## 2024-04-19 ENCOUNTER — Encounter: Payer: Self-pay | Admitting: Oncology

## 2024-04-19 ENCOUNTER — Other Ambulatory Visit

## 2024-04-19 ENCOUNTER — Inpatient Hospital Stay

## 2024-04-19 ENCOUNTER — Inpatient Hospital Stay: Attending: Radiation Oncology | Admitting: Oncology

## 2024-04-19 VITALS — BP 131/89 | HR 84 | Temp 97.5°F | Resp 18 | Wt 218.6 lb

## 2024-04-19 DIAGNOSIS — E119 Type 2 diabetes mellitus without complications: Secondary | ICD-10-CM | POA: Diagnosis not present

## 2024-04-19 DIAGNOSIS — Z87891 Personal history of nicotine dependence: Secondary | ICD-10-CM | POA: Insufficient documentation

## 2024-04-19 DIAGNOSIS — G4733 Obstructive sleep apnea (adult) (pediatric): Secondary | ICD-10-CM | POA: Diagnosis not present

## 2024-04-19 DIAGNOSIS — Z923 Personal history of irradiation: Secondary | ICD-10-CM | POA: Diagnosis not present

## 2024-04-19 DIAGNOSIS — Z7984 Long term (current) use of oral hypoglycemic drugs: Secondary | ICD-10-CM | POA: Diagnosis not present

## 2024-04-19 DIAGNOSIS — F32A Depression, unspecified: Secondary | ICD-10-CM | POA: Diagnosis not present

## 2024-04-19 DIAGNOSIS — M1990 Unspecified osteoarthritis, unspecified site: Secondary | ICD-10-CM | POA: Diagnosis not present

## 2024-04-19 DIAGNOSIS — Z8546 Personal history of malignant neoplasm of prostate: Secondary | ICD-10-CM | POA: Diagnosis not present

## 2024-04-19 DIAGNOSIS — D72819 Decreased white blood cell count, unspecified: Secondary | ICD-10-CM | POA: Insufficient documentation

## 2024-04-19 DIAGNOSIS — Z79899 Other long term (current) drug therapy: Secondary | ICD-10-CM | POA: Diagnosis not present

## 2024-04-19 DIAGNOSIS — F1090 Alcohol use, unspecified, uncomplicated: Secondary | ICD-10-CM | POA: Insufficient documentation

## 2024-04-19 DIAGNOSIS — F419 Anxiety disorder, unspecified: Secondary | ICD-10-CM | POA: Insufficient documentation

## 2024-04-19 DIAGNOSIS — E7800 Pure hypercholesterolemia, unspecified: Secondary | ICD-10-CM | POA: Insufficient documentation

## 2024-04-19 DIAGNOSIS — Z7982 Long term (current) use of aspirin: Secondary | ICD-10-CM | POA: Diagnosis not present

## 2024-04-19 DIAGNOSIS — Z9079 Acquired absence of other genital organ(s): Secondary | ICD-10-CM | POA: Insufficient documentation

## 2024-04-19 DIAGNOSIS — Z7985 Long-term (current) use of injectable non-insulin antidiabetic drugs: Secondary | ICD-10-CM | POA: Insufficient documentation

## 2024-04-19 DIAGNOSIS — I10 Essential (primary) hypertension: Secondary | ICD-10-CM | POA: Diagnosis not present

## 2024-04-19 DIAGNOSIS — Z139 Encounter for screening, unspecified: Secondary | ICD-10-CM

## 2024-04-19 LAB — CBC WITH DIFFERENTIAL/PLATELET
Abs Immature Granulocytes: 0.01 10*3/uL (ref 0.00–0.07)
Basophils Absolute: 0 10*3/uL (ref 0.0–0.1)
Basophils Relative: 1 %
Eosinophils Absolute: 0.1 10*3/uL (ref 0.0–0.5)
Eosinophils Relative: 3 %
HCT: 42.2 % (ref 39.0–52.0)
Hemoglobin: 14.1 g/dL (ref 13.0–17.0)
Immature Granulocytes: 0 %
Lymphocytes Relative: 24 %
Lymphs Abs: 0.8 10*3/uL (ref 0.7–4.0)
MCH: 26.7 pg (ref 26.0–34.0)
MCHC: 33.4 g/dL (ref 30.0–36.0)
MCV: 79.9 fL — ABNORMAL LOW (ref 80.0–100.0)
Monocytes Absolute: 0.4 10*3/uL (ref 0.1–1.0)
Monocytes Relative: 11 %
Neutro Abs: 2.1 10*3/uL (ref 1.7–7.7)
Neutrophils Relative %: 61 %
Platelets: 266 10*3/uL (ref 150–400)
RBC: 5.28 MIL/uL (ref 4.22–5.81)
RDW: 14.6 % (ref 11.5–15.5)
WBC: 3.4 10*3/uL — ABNORMAL LOW (ref 4.0–10.5)
nRBC: 0 % (ref 0.0–0.2)

## 2024-04-19 LAB — VITAMIN B12: Vitamin B-12: 605 pg/mL (ref 180–914)

## 2024-04-19 LAB — TECHNOLOGIST SMEAR REVIEW: Plt Morphology: ADEQUATE

## 2024-04-19 LAB — LACTATE DEHYDROGENASE: LDH: 135 U/L (ref 98–192)

## 2024-04-19 LAB — HIV ANTIBODY (ROUTINE TESTING W REFLEX): HIV Screen 4th Generation wRfx: NONREACTIVE

## 2024-04-19 LAB — FOLATE: Folate: 26 ng/mL (ref >5.9)

## 2024-04-19 NOTE — Progress Notes (Signed)
 Hematology/Oncology Consult Note [phone] [phone]    REFERRING PROVIDER: Antonette Angeline ORN, NP   CHIEF COMPLAINTS/REASON FOR VISIT:  Evaluation of leukopenia   ASSESSMENT & PLAN:   Leukopenia I discussed with patient that the differential diagnosis of leukopenia is broad, including acute or chronic infection, inflammation, nutrition deficiency, autoimmune disease, alcohol  use ethnic, or malignant etiology including underlying bone morrow disorders.   For the work up of patient's leukoepenia, I recommend checking CBC;LDH; smear review, folate, Vitamin B12, hepatitis, HIV, flowcytometry and monoclonal gammopathy workup.    Alcohol  use Recommend patient to decrease alcohol  use   Orders Placed This Encounter  Procedures   Vitamin B12    Standing Status:   Future    Number of Occurrences:   1    Expected Date:   04/19/2024    Expiration Date:   07/18/2024   Folate    Standing Status:   Future    Number of Occurrences:   1    Expected Date:   04/19/2024    Expiration Date:   07/18/2024   CBC with Differential/Platelet    Standing Status:   Future    Number of Occurrences:   1    Expected Date:   04/19/2024    Expiration Date:   07/18/2024   Flow cytometry panel-leukemia/lymphoma work-up    Standing Status:   Future    Number of Occurrences:   1    Expected Date:   04/19/2024    Expiration Date:   07/18/2024   Lactate dehydrogenase    Standing Status:   Future    Number of Occurrences:   1    Expected Date:   04/19/2024    Expiration Date:   07/18/2024   HIV Antibody (routine testing w rflx)    Standing Status:   Future    Number of Occurrences:   1    Expected Date:   04/19/2024    Expiration Date:   07/18/2024   Hepatitis panel, acute    Standing Status:   Future    Number of Occurrences:   1    Expected Date:   04/19/2024    Expiration Date:   07/18/2024   Protein electrophoresis, serum    Standing Status:   Future    Number of Occurrences:   1     Expected Date:   04/19/2024    Expiration Date:   07/18/2024   Technologist smear review    Standing Status:   Future    Number of Occurrences:   1    Expiration Date:   04/19/2025    Clinical information::   leukopenia   Ambulatory referral to Social Work    Referral Priority:   Routine    Referral Type:   Consultation    Referral Reason:   Specialty Services Required    Number of Visits Requested:   1   Follow-up in a few weeks to review results. All questions were answered. The patient knows to call the clinic with any problems, questions or concerns.  Zelphia Cap, MD, PhD [facility] Hematology Oncology 04/19/2024    HISTORY OF PRESENTING ILLNESS:  Marc Aguilar is a 63 y.o. male who was seen in consultation at the request of Baity, Angeline ORN, NP for evaluation of leukopenia Reviewed patient's recent labs.  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  Patient denies fatigue, weight loss, fever, chills, frequent infection.  Denies history hepatitis or HIV infection Denies  history of chronic liver disease Denies routine alcohol  consumption. Denies dietary restrictions.  Denies Herbal medication.  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 PSA has been monitored by radiation oncology and urology.  Recent PSA March 2025 was less than 0.1.  Patient denies any frequent infection, abdominal pain, rectal bleeding.  Patient drinks alcohol  6 to 8 cans of beers on most days.  MEDICAL HISTORY:  Past Medical History:  Diagnosis Date   Anxiety    Arthritis    knees and hips (02/28/2018)   Cervical radiculitis    Chicken pox    Depression    GERD (gastroesophageal reflux disease)    Heart murmur    Hernia, abdominal    High cholesterol    History of gout    History of hiatal hernia    Hyperlipidemia associated with type 2  diabetes mellitus (HCC)    Hypertension    Migraine    probably monthly (02/28/2018)   Motion sickness    ocean boats, circular motion   Neck pain    OSA on CPAP    Osteoarthritis    Prostate cancer (HCC) 2021   surgery and radiation treatments   Refusal of blood transfusions as patient is Jehovah's Witness    Seasonal allergies    Type II diabetes mellitus (HCC)     SURGICAL HISTORY: Past Surgical History:  Procedure Laterality Date   CARDIAC CATHETERIZATION  02/28/2018   ESOPHAGOGASTRODUODENOSCOPY (EGD) WITH PROPOFOL  N/A 03/17/2023   Procedure: ESOPHAGOGASTRODUODENOSCOPY (EGD) WITH PROPOFOL ;  Surgeon: Jinny Carmine, MD;  Location: [facility];  Service: Endoscopy;  Laterality: N/A;  Diabetic   ESOPHAGOGASTRODUODENOSCOPY (EGD) WITH PROPOFOL  N/A 04/18/2023   Procedure: ESOPHAGOGASTRODUODENOSCOPY (EGD) WITH PROPOFOL ;  Surgeon: Jinny Carmine, MD;  Location: [facility];  Service: Endoscopy;  Laterality: N/A;   LEFT HEART CATH AND CORONARY ANGIOGRAPHY N/A 02/28/2018   Procedure: LEFT HEART CATH AND CORONARY ANGIOGRAPHY;  Surgeon: Wonda Sharper, MD;  Location[facility] INV[facility] LAB;  Service: Cardiovascular;  Laterality: N/A;   ROBOT ASSISTED LAPAROSCOPIC RADICAL PROSTATECTOMY N/A 11/19/2019   Procedure: XI ROBOTIC ASSISTED LAPAROSCOPIC RADICAL PROSTATECTOMY WITH LYMPH NODE DISSECTION;  Surgeon: Penne Knee, MD;  Location: [facility] ORS;  Service: Urology;  Laterality: N/A;   SHOULDER ARTHROSCOPY WITH ROTATOR CUFF REPAIR AND SUBACROMIAL DECOMPRESSION Right 05/21/2022   Procedure: Right shoulder arthroscopic rotator cuff repair (supraspinatus & subscapularis), subacromial decompression, and biceps tenodesis;  Surgeon: Tobie Priest, MD;  Location: [facility] OR[facility];  Service: Orthopedics;  Laterality: Right;   VASECTOMY     WRIST GANGLION EXCISION Left 1984    SOCIAL HISTORY: Social History   Socioeconomic History   Marital status: Married    Spouse name: Nena   Number of  children: 2   Years of education: Not on file   Highest education level: Bachelor's degree (e.g., BA, AB, BS)  Occupational History   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  Tobacco Use   Smoking status: Former    Current packs/day: 0.00    Average packs/day: 1 pack/day for 27.0 years (27.0 ttl pk-yrs)    Types: Cigarettes    Start date: 10/18/1982    Quit date: 10/18/2009    Years since quitting: 14.5   Smokeless tobacco: Former    Types: Chew    Quit date: 10/18/1994  Vaping Use   Vaping status: Never Used  Substance and Sexual Activity  Alcohol  use: Yes    Alcohol /week: 21.0 standard drinks of alcohol     Types: 21 Cans of beer per week    Comment: 6 -8 beers daily   Drug use: Never   Sexual activity: Not on file  Other Topics Concern   Not on file  Social History Narrative   Not on file   Social Drivers of Health   Financial Resource Strain: Low Risk  (04/19/2024)   Overall Financial Resource Strain (CARDIA)    Difficulty of Paying Living Expenses: Not very hard  Food Insecurity: No Food Insecurity (04/19/2024)   Hunger Vital Sign    Worried About Running Out of Food in the Last Year: Never true    Ran Out of Food in the Last Year: Never true  Transportation Needs: No Transportation Needs (04/19/2024)   PRAPARE - Administrator, Civil Service (Medical): No    Lack of Transportation (Non-Medical): No  Physical Activity: Inactive (12/26/2023)   Exercise Vital Sign    Days of Exercise per Week: 0 days    Minutes of Exercise per Session: 150+ min  Stress: Stress Concern Present (04/19/2024)   [employer] of Occupational Health - Occupational Stress Questionnaire    Feeling of Stress: Rather much  Social Connections: Moderately Isolated (12/26/2023)   Social Connection and Isolation Panel    Frequency of Communication with Friends and Family: Once a week    Frequency of Social Gatherings with Friends and Family: Never    Attends Religious Services: 1 to 4 times per year     Active Member of [employer] or Organizations: No    Attends Engineer, structural: Not on file    Marital Status: Married  Catering manager Violence: Not At Risk (04/19/2024)   Humiliation, Afraid, Rape, and Kick questionnaire    Fear of Current or Ex-Partner: No    Emotionally Abused: No    Physically Abused: No    Sexually Abused: No    FAMILY HISTORY: Family History  Problem Relation Age of Onset   Diabetes Mother    Hyperlipidemia Mother    Bladder Cancer Father    Liver cancer Sister    Diabetes Sister    Diabetes Maternal Aunt    Arthritis Maternal Grandmother    Diabetes Maternal Grandmother    Arthritis Maternal Grandfather    Arthritis Paternal Grandmother    Diabetes Brother    Heart disease Brother    Colon cancer Neg Hx     ALLERGIES:  is allergic to tape.  MEDICATIONS:  Current Outpatient Medications  Medication Sig Dispense Refill   Alcohol  Swabs (ALCOHOL  PREP) PADS 1 each by Does not apply route 2 (two) times daily. 200 each 3   amLODipine  (NORVASC ) 10 MG tablet Take 1 tablet (10 mg total) by mouth daily. 90 tablet 0   aspirin  325 MG tablet Take 325 mg by mouth daily.     atorvastatin  (LIPITOR ) 40 MG tablet Take 1 tablet (40 mg total) by mouth daily. 90 tablet 1   Blood Glucose Monitoring Suppl (BLOOD GLUCOSE MONITOR SYSTEM) w/Device KIT Use as directed to check blood sugar once daily. 1 kit 0   buPROPion  (WELLBUTRIN  XL) 300 MG 24 hr tablet Take 1 tablet (300 mg total) by mouth daily. 90 tablet 0   celecoxib  (CELEBREX ) 200 MG capsule Take 1 capsule (200 mg total) by mouth every 12 (twelve) hours. 28 capsule 0   citalopram  (CELEXA ) 40 MG tablet Take 1 tablet (40 mg total) by  mouth daily. 90 tablet 0   ezetimibe  (ZETIA ) 10 MG tablet Take 1 tablet (10 mg total) by mouth daily. 90 tablet 0   glipiZIDE  (GLUCOTROL  XL) 10 MG 24 hr tablet Take 1 tablet (10 mg total) by mouth daily with breakfast. 90 tablet 0   Glucose Blood (BLOOD GLUCOSE TEST STRIPS) STRP Use to  check blood sugar once a day. 100 strip 1   Lancets (FREESTYLE) lancets Use as directed to check blood sugar once daily. 100 each 1   methocarbamol  (ROBAXIN ) 500 MG tablet Take 1 tablet (500 mg total) by mouth every 8 (eight) hours as needed for muscle spasms. 30 tablet 0   pantoprazole  (PROTONIX ) 40 MG tablet Take 1 tablet (40 mg total) by mouth daily. 90 tablet 1   tadalafil  (CIALIS ) 20 MG tablet Take 1 tablet (20 mg total) by mouth daily as needed for erectile dysfunction. 30 tablet 11   tirzepatide  (MOUNJARO ) 7.5 MG/0.5ML Pen Inject 7.5 mg into the skin once a week. 6 mL 1   traZODone  (DESYREL ) 100 MG tablet Take 2 tablets (200 mg total) by mouth at bedtime. 180 tablet 1   No current facility-administered medications for this visit.     Review of Systems  Constitutional:  Negative for appetite change, chills, fatigue, fever and unexpected weight change.  HENT:   Negative for hearing loss and voice change.   Eyes:  Negative for eye problems and icterus.  Respiratory:  Negative for chest tightness, cough and shortness of breath.   Cardiovascular:  Negative for chest pain and leg swelling.  Gastrointestinal:  Negative for abdominal distention and abdominal pain.  Endocrine: Negative for hot flashes.  Genitourinary:  Negative for difficulty urinating, dysuria and frequency.   Musculoskeletal:  Negative for arthralgias.  Skin:  Negative for itching and rash.  Neurological:  Negative for light-headedness and numbness.  Hematological:  Negative for adenopathy. Does not bruise/bleed easily.  Psychiatric/Behavioral:  Negative for confusion.     PHYSICAL EXAMINATION: ECOG PERFORMANCE STATUS: 0 - Asymptomatic Vitals:   04/19/24 1114  BP: 131/89  Pulse: 84  Resp: 18  Temp: (!) 97.5 F (36.4 C)  SpO2: 99%   Filed Weights   04/19/24 1114  Weight: 218 lb 9.6 oz (99.2 kg)     Physical Exam Constitutional:      General: He is not in acute distress. HENT:     Head: Normocephalic  and atraumatic.  Eyes:     General: No scleral icterus. Cardiovascular:     Rate and Rhythm: Normal rate and regular rhythm.     Heart sounds: Normal heart sounds.  Pulmonary:     Effort: Pulmonary effort is normal. No respiratory distress.     Breath sounds: No wheezing.  Abdominal:     General: Bowel sounds are normal. There is no distension.     Palpations: Abdomen is soft.  Musculoskeletal:        General: No deformity. Normal range of motion.     Cervical back: Normal range of motion and neck supple.  Skin:    General: Skin is warm and dry.     Findings: No erythema or rash.  Neurological:     Mental Status: He is alert and oriented to person, place, and time. Mental status is at baseline.     Cranial Nerves: No cranial nerve deficit.     Coordination: Coordination normal.  Psychiatric:        Mood and Affect: Mood normal.     RADIOGRAPHIC  STUDIES: I have personally reviewed the radiological images as listed and agreed with the findings in the report. No results found.   LABORATORY DATA:  I have reviewed the data as listed    Latest Ref Rng & Units 04/19/2024   11:54 AM 04/09/2024   10:07 AM 12/26/2023    9:51 AM  CBC  WBC 4.0 - 10.5 K/uL 3.4  2.8  2.7   Hemoglobin 13.0 - 17.0 g/dL 85.8  86.9  88.1   Hematocrit 39.0 - 52.0 % 42.2  41.9  36.8   Platelets 150 - 400 K/uL 266  235  255       Latest Ref Rng & Units 04/09/2024   10:07 AM 12/26/2023    9:51 AM 09/27/2023    9:49 AM  CMP  Glucose 65 - 99 mg/dL 859  785  744   BUN 7 - 25 mg/dL 19  15  19    Creatinine 0.70 - 1.35 mg/dL 9.24  9.25  9.02   Sodium 135 - 146 mmol/L 140  138  136   Potassium 3.5 - 5.3 mmol/L 4.4  4.1  4.3   Chloride 98 - 110 mmol/L 106  106  99   CO2 20 - 32 mmol/L 25  24  26    Calcium  8.6 - 10.3 mg/dL 9.4  9.4  9.5   Total Protein 6.1 - 8.1 g/dL 6.6  6.7    Total Bilirubin 0.2 - 1.2 mg/dL 0.4  0.4    AST 10 - 35 U/L 16  18    ALT 9 - 46 U/L 16  20         RADIOGRAPHIC STUDIES: I  have personally reviewed the radiological images as listed and agreed with the findings in the report. No results found.

## 2024-04-19 NOTE — Assessment & Plan Note (Signed)
 I discussed with patient that the differential diagnosis of leukopenia is broad, including acute or chronic infection, inflammation, nutrition deficiency, autoimmune disease, alcohol  use ethnic, or malignant etiology including underlying bone morrow disorders.   For the work up of patient's leukoepenia, I recommend checking CBC;LDH; smear review, folate, Vitamin B12, hepatitis, HIV, flowcytometry and monoclonal gammopathy workup.

## 2024-04-19 NOTE — Assessment & Plan Note (Signed)
 Recommend patient to decrease alcohol  use

## 2024-04-19 NOTE — Progress Notes (Signed)
 [facility] CSW Counseling Note  Patient was referred by medical provider. Treatment type: Individual  Presenting Concerns: Patient and/or family reports the following symptoms/concerns: depression Duration of problem: several years; Severity of problem: moderate   Orientation:oriented to person, place, time/date, situation, day of week, month of year, and year.   Affect: NA Due to phone session. Risk of harm to self or others: No plan to harm self or others  Patient and/or Family's Strengths/Protective Factors: Concrete supports in place (healthy food, safe environments, etc.)Capable of independent living  Communication skills  General fund of knowledge  Supportive family/friends  Work skills      Goals Addressed: Patient will:  Reduce symptoms of: depression Increase knowledge and/or ability of: self-management skills  Increase healthy adjustment to current life circumstances   Progress towards Goals: Initial   Interventions: Interventions utilized:  Motivational Interviewing and Supportive Counseling  PHQ 9     Assessment: Patient currently experiencing depression.  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  His wife has increased health issues and is unable to ambulate.  His brother-in-law also lives with them.  He is blind and autistic.  [place] stated he was diagnosed with depression several years ago and is currently taking an antidepressant.  He is also seeing a Veterinary surgeon.  Patient has three sisters and they all live in [place] .  His brother died last year from cancer.        Plan: Follow up with CSW: Will phone patient. Behavioral recommendations: Continue seeing counselor. Referral(s): None at this time.       Macario HERO Aeon Kessner, LCSW

## 2024-04-20 LAB — HEPATITIS PANEL, ACUTE
HCV Ab: NONREACTIVE
Hep A IgM: NONREACTIVE
Hep B C IgM: NONREACTIVE
Hepatitis B Surface Ag: NONREACTIVE

## 2024-04-23 LAB — PROTEIN ELECTROPHORESIS, SERUM
A/G Ratio: 1.4 (ref 0.7–1.7)
Albumin ELP: 4.1 g/dL (ref 2.9–4.4)
Alpha-1-Globulin: 0.2 g/dL (ref 0.0–0.4)
Alpha-2-Globulin: 0.7 g/dL (ref 0.4–1.0)
Beta Globulin: 1.1 g/dL (ref 0.7–1.3)
Gamma Globulin: 1.1 g/dL (ref 0.4–1.8)
Globulin, Total: 3 g/dL (ref 2.2–3.9)
Total Protein ELP: 7.1 g/dL (ref 6.0–8.5)

## 2024-04-25 LAB — COMP PANEL: LEUKEMIA/LYMPHOMA: Immunophenotypic Profile: 2

## 2024-05-23 ENCOUNTER — Inpatient Hospital Stay: Attending: Radiation Oncology | Admitting: Oncology

## 2024-05-23 ENCOUNTER — Telehealth: Payer: Self-pay | Admitting: Oncology

## 2024-05-23 NOTE — Telephone Encounter (Signed)
 Pt was scheduled for appt today at 1pm MD only. Pt no showed appt. I called pt to r/s appt. Spoke with pt, he is at hospital with family member and forgot about appt. MD appt is r/s to next available. Confirmed time and date with pt

## 2024-05-24 ENCOUNTER — Other Ambulatory Visit: Payer: Self-pay | Admitting: Physician Assistant

## 2024-05-24 ENCOUNTER — Other Ambulatory Visit: Payer: Self-pay

## 2024-05-24 ENCOUNTER — Other Ambulatory Visit: Payer: Self-pay | Admitting: Internal Medicine

## 2024-05-24 DIAGNOSIS — Z0001 Encounter for general adult medical examination with abnormal findings: Secondary | ICD-10-CM

## 2024-05-24 MED FILL — Methocarbamol Tab 500 MG: ORAL | 10 days supply | Qty: 30 | Fill #0 | Status: AC

## 2024-05-25 ENCOUNTER — Other Ambulatory Visit: Payer: Self-pay

## 2024-05-28 ENCOUNTER — Other Ambulatory Visit: Payer: Self-pay

## 2024-05-28 NOTE — Telephone Encounter (Signed)
 Rx requested filled- 04/11/24 #90- all too soon except trazodone  Requested Prescriptions  Pending Prescriptions Disp Refills   traZODone  (DESYREL ) 100 MG tablet 180 tablet 0    Sig: Take 2 tablets (200 mg total) by mouth at bedtime.     Psychiatry: Antidepressants - Serotonin Modulator Passed - 05/28/2024  3:59 PM      Passed - Completed PHQ-2 or PHQ-9 in the last 360 days      Passed - Valid encounter within last 6 months    Recent Outpatient Visits           1 month ago Encounter for general adult medical examination with abnormal findings   [place] [facility] [place], [place] W, NP   5 months ago Type 2 diabetes mellitus with hyperglycemia, without long-term current use of insulin  [facility])   [facility] [place], Angeline ORN, NP   6 months ago Cervical radiculitis   [place] [facility] [place], Angeline ORN, NP       Future Appointments             In 7 months Penne Knee, MD [facility] [place]            Refused Prescriptions Disp Refills   amLODipine  (NORVASC ) 10 MG tablet 90 tablet 0    Sig: Take 1 tablet (10 mg total) by mouth daily.     Cardiovascular: Calcium  Channel Blockers 2 Passed - 05/28/2024  3:59 PM      Passed - Last BP in normal range    BP Readings from Last 1 Encounters:  04/19/24 131/89         Passed - Last Heart Rate in normal range    Pulse Readings from Last 1 Encounters:  04/19/24 84         Passed - Valid encounter within last 6 months    Recent Outpatient Visits           1 month ago Encounter for general adult medical examination with abnormal findings   [place] [facility] [place], [place] W, NP   5 months ago Type 2 diabetes mellitus with hyperglycemia, without long-term current use of insulin  [facility])   [place] [facility] [place], Angeline ORN, NP   6 months ago Cervical radiculitis    [place] [place],  Angeline ORN, NP       Future Appointments             In 7 months Penne Knee, MD [facility] [place]             buPROPion  (WELLBUTRIN  XL) 300 MG 24 hr tablet 90 tablet 0    Sig: Take 1 tablet (300 mg total) by mouth daily.     Psychiatry: Antidepressants - bupropion  Passed - 05/28/2024  3:59 PM      Passed - Cr in normal range and within 360 days    Creat  Date Value Ref Range Status  04/09/2024 0.75 0.70 - 1.35 mg/dL Final   Creatinine, Urine  Date Value Ref Range Status  04/09/2024 89 20 - 320 mg/dL Final         Passed - AST in normal range and within 360 days    AST  Date Value Ref Range Status  04/09/2024 16 10 - 35 U/L Final         Passed - ALT in normal range and within 360 days  ALT  Date Value Ref Range Status  04/09/2024 16 9 - 46 U/L Final         Passed - Completed PHQ-2 or PHQ-9 in the last 360 days      Passed - Last BP in normal range    BP Readings from Last 1 Encounters:  04/19/24 131/89         Passed - Valid encounter within last 6 months    Recent Outpatient Visits           1 month ago Encounter for general adult medical examination with abnormal findings   [place] [facility] [place], [place] W, NP   5 months ago Type 2 diabetes mellitus with hyperglycemia, without long-term current use of insulin  [facility])   [place] [facility] [place], Angeline ORN, NP   6 months ago Cervical radiculitis   [place] [facility] [place], Angeline ORN, NP       Future Appointments             In 7 months Penne Knee, MD [facility] [place]             citalopram  (CELEXA ) 40 MG tablet 90 tablet 0    Sig: Take 1 tablet (40 mg total) by mouth daily.     Psychiatry:  Antidepressants - SSRI Passed - 05/28/2024  3:59 PM      Passed - Completed PHQ-2 or PHQ-9 in the last 360 days      Passed - Valid encounter within last 6 months    Recent Outpatient Visits           1  month ago Encounter for general adult medical examination with abnormal findings   [place] [facility] [place], [place] W, NP   5 months ago Type 2 diabetes mellitus with hyperglycemia, without long-term current use of insulin  [facility])   [place] [facility] [place], Angeline ORN, NP   6 months ago Cervical radiculitis   [place] [facility] [place], Angeline ORN, NP       Future Appointments             In 7 months Penne Knee, MD [facility] [place]             ezetimibe  (ZETIA ) 10 MG tablet 90 tablet 0    Sig: Take 1 tablet (10 mg total) by mouth daily.     Cardiovascular:  Antilipid - Sterol Transport Inhibitors Failed - 05/28/2024  3:59 PM      Failed - Lipid Panel in normal range within the last 12 months    Cholesterol, Total  Date Value Ref Range Status  10/26/2019 157 100 - 199 mg/dL Final   Cholesterol  Date Value Ref Range Status  04/09/2024 178 <200 mg/dL Final   LDL Cholesterol (Calc)  Date Value Ref Range Status  04/09/2024 90 mg/dL (calc) Final    Comment:    Reference range: <100 . Desirable range <100 mg/dL for primary prevention;   <70 mg/dL for patients with CHD or diabetic patients  with > or = 2 CHD risk factors. [facility] LDL-C is now calculated using the Martin-Hopkins  calculation, which is a validated novel method providing  better accuracy than the Friedewald equation in the  estimation of LDL-C.  Gladis APPLETHWAITE et al. SANDREA. 7986;689(80): 2061-2068  (http://education.[employer].com/faq/FAQ164)    Direct LDL  Date Value Ref Range Status  01/01/2021 127.0  mg/dL Final    Comment:    Optimal:  <100 mg/dLNear or Above Optimal:  100-129 mg/dLBorderline High:  130-159 mg/dLHigh:  160-189 mg/dLVery High:  >190 mg/dL   HDL  Date Value Ref Range Status  04/09/2024 50 > OR = 40 mg/dL Final  98/91/7978 48 >60 mg/dL Final   Triglycerides  Date Value Ref Range Status  04/09/2024 319 (H) <150  mg/dL Final    Comment:    . If a non-fasting specimen was collected, consider repeat triglyceride testing on a fasting specimen if clinically indicated.  Veatrice et al. J. of Clin. Lipidol. 2015;9:129-169. [facility]ABRA          Passed - AST in normal range and within 360 days    AST  Date Value Ref Range Status  04/09/2024 16 10 - 35 U/L Final         Passed - ALT in normal range and within 360 days    ALT  Date Value Ref Range Status  04/09/2024 16 9 - 46 U/L Final         Passed - Patient is not pregnant      Passed - Valid encounter within last 12 months    Recent Outpatient Visits           1 month ago Encounter for general adult medical examination with abnormal findings   [place] [facility] [place], Angeline ORN, NP   5 months ago Type 2 diabetes mellitus with hyperglycemia, without long-term current use of insulin  [facility])   [place] [facility] [place], Angeline ORN, NP   6 months ago Cervical radiculitis   [place] [facility] [place], Angeline ORN, NP       Future Appointments             In 7 months Penne Knee, MD [facility] [place]             glipiZIDE  (GLUCOTROL  XL) 10 MG 24 hr tablet 90 tablet 0    Sig: Take 1 tablet (10 mg total) by mouth daily with breakfast.     Endocrinology:  Diabetes - Sulfonylureas Passed - 05/28/2024  3:59 PM      Passed - HBA1C is between 0 and 7.9 and within 180 days    Hgb A1c MFr Bld  Date Value Ref Range Status  04/09/2024 5.7 (H) <5.7 % Final    Comment:    For someone without known diabetes, a hemoglobin  A1c value between 5.7% and 6.4% is consistent with prediabetes and should be confirmed with a  follow-up test. . For someone with known diabetes, a value <7% indicates that their diabetes is well controlled. A1c targets should be individualized based on duration of diabetes, age, comorbid conditions, and other considerations. . This assay result is  consistent with an increased risk of diabetes. . Currently, no consensus exists regarding use of hemoglobin A1c for diagnosis of diabetes for children. .          Passed - Cr in normal range and within 360 days    Creat  Date Value Ref Range Status  04/09/2024 0.75 0.70 - 1.35 mg/dL Final   Creatinine, Urine  Date Value Ref Range Status  04/09/2024 89 20 - 320 mg/dL Final         Passed - Valid encounter within last 6 months    Recent Outpatient Visits           1 month  ago Encounter for general adult medical examination with abnormal findings   [facility] [place], [place] W, NP   5 months ago Type 2 diabetes mellitus with hyperglycemia, without long-term current use of insulin  [facility])   [place] [facility] [place], Angeline ORN, NP   6 months ago Cervical radiculitis   [place] [facility] [place], Angeline ORN, NP       Future Appointments             In 7 months Penne Knee, MD [facility] [place] [facility]

## 2024-06-05 DIAGNOSIS — F331 Major depressive disorder, recurrent, moderate: Secondary | ICD-10-CM | POA: Diagnosis not present

## 2024-06-05 DIAGNOSIS — F419 Anxiety disorder, unspecified: Secondary | ICD-10-CM | POA: Diagnosis not present

## 2024-06-07 ENCOUNTER — Encounter: Payer: Self-pay | Admitting: Oncology

## 2024-06-07 ENCOUNTER — Inpatient Hospital Stay: Attending: Radiation Oncology | Admitting: Oncology

## 2024-06-07 VITALS — BP 129/81 | HR 99 | Temp 96.4°F | Resp 18 | Wt 219.7 lb

## 2024-06-07 DIAGNOSIS — F1090 Alcohol use, unspecified, uncomplicated: Secondary | ICD-10-CM | POA: Diagnosis not present

## 2024-06-07 DIAGNOSIS — D72820 Lymphocytosis (symptomatic): Secondary | ICD-10-CM | POA: Diagnosis not present

## 2024-06-07 DIAGNOSIS — D72819 Decreased white blood cell count, unspecified: Secondary | ICD-10-CM | POA: Insufficient documentation

## 2024-06-07 NOTE — Assessment & Plan Note (Signed)
 Peripheral flow cytometry showed CD5+, CD23+, FMC7+, CD38+ (97%), CD19+, CD20+ and CD22+ clonal B cell population detected, 2% of leukocytes, <5000 cells/uL  Patient has no constitutional symptoms.  Recommend observation.

## 2024-06-07 NOTE — Assessment & Plan Note (Signed)
 Recommend patient to decrease alcohol  use

## 2024-06-07 NOTE — Assessment & Plan Note (Signed)
 Chronic leukopenia, previous workup results reviewed and discussed with patient. Total WBC has improved. No M protein.  Normal B12 and folate.  Negative HIV hepatitis panel Monoclonal lymphocytosis of unknown significance, I doubt this is the reason of leukopenia. Leukopenia is mild..  Recommend observation

## 2024-06-07 NOTE — Progress Notes (Signed)
 Hematology/Oncology Progress note [phone] [phone]      REFERRING PROVIDER: Antonette Angeline ORN, NP   CHIEF COMPLAINTS/REASON FOR VISIT:  Evaluation of leukopenia   ASSESSMENT & PLAN:   Leukopenia Chronic leukopenia, previous workup results reviewed and discussed with patient. Total WBC has improved. No M protein.  Normal B12 and folate.  Negative HIV hepatitis panel Monoclonal lymphocytosis of unknown significance, I doubt this is the reason of leukopenia. Leukopenia is mild..  Recommend observation  Alcohol  use Recommend patient to decrease alcohol  use  Monoclonal B-cell lymphocytosis of unknown significance Peripheral flow cytometry showed CD5+, CD23+, FMC7+, CD38+ (97%), CD19+, CD20+ and CD22+ clonal B cell population detected, 2% of leukocytes, <5000 cells/uL  Patient has no constitutional symptoms.  Recommend observation.   Orders Placed This Encounter  Procedures   CMP (Cancer Center only)    Standing Status:   Future    Expected Date:   12/08/2024    Expiration Date:   03/08/2025   CBC with Differential (Cancer Center Only)    Standing Status:   Future    Expected Date:   12/08/2024    Expiration Date:   03/08/2025   Lactate dehydrogenase    Standing Status:   Future    Expected Date:   12/08/2024    Expiration Date:   03/08/2025   Follow-up in a few weeks to review results. All questions were answered. The patient knows to call the clinic with any problems, questions or concerns.  Zelphia Cap, MD, PhD [facility] Hematology Oncology 06/07/2024    HISTORY OF PRESENTING ILLNESS:  Marc Aguilar is a 63 y.o. male who was seen in consultation at the request of Baity, Angeline ORN, NP for evaluation of leukopenia Reviewed patient's recent labs.  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  Patient denies fatigue, weight loss, fever, chills, frequent infection.  Denies history hepatitis or HIV infection Denies history of  chronic liver disease Denies routine alcohol  consumption. Denies dietary restrictions.  Denies Herbal medication.  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 PSA has been monitored by radiation oncology and urology.  Recent PSA March 2025 was less than 0.1.  Patient denies any frequent infection, abdominal pain, rectal bleeding.  Patient drinks alcohol  6 to 8 cans of beers on most days.  INTERVAL HISTORY Marc Aguilar is a 63 y.o. male who has above history reviewed by me today presents for follow up visit for leukopenia. Patient presented to discuss results.  No new complaints.   MEDICAL HISTORY:  Past Medical History:  Diagnosis Date   Anxiety    Arthritis    knees and hips (02/28/2018)   Cervical radiculitis    Chicken pox    Depression    GERD (gastroesophageal reflux disease)    Heart murmur    Hernia, abdominal    High cholesterol    History of gout    History of hiatal hernia    Hyperlipidemia associated with type 2 diabetes mellitus (HCC)    Hypertension    Migraine    probably monthly (02/28/2018)   Motion sickness    ocean boats, circular motion   Neck pain    OSA on CPAP    Osteoarthritis    Prostate cancer (HCC) 2021   surgery and radiation treatments   Refusal of blood transfusions as patient  is Jehovah's Witness    Seasonal allergies    Type II diabetes mellitus (HCC)     SURGICAL HISTORY: Past Surgical History:  Procedure Laterality Date   CARDIAC CATHETERIZATION  02/28/2018   ESOPHAGOGASTRODUODENOSCOPY (EGD) WITH PROPOFOL  N/A 03/17/2023   Procedure: ESOPHAGOGASTRODUODENOSCOPY (EGD) WITH PROPOFOL ;  Surgeon: Jinny Carmine, MD;  Location: [facility];  Service: Endoscopy;  Laterality: N/A;  Diabetic   ESOPHAGOGASTRODUODENOSCOPY (EGD) WITH PROPOFOL  N/A 04/18/2023   Procedure: ESOPHAGOGASTRODUODENOSCOPY  (EGD) WITH PROPOFOL ;  Surgeon: Jinny Carmine, MD;  Location: [facility];  Service: Endoscopy;  Laterality: N/A;   LEFT HEART CATH AND CORONARY ANGIOGRAPHY N/A 02/28/2018   Procedure: LEFT HEART CATH AND CORONARY ANGIOGRAPHY;  Surgeon: Wonda Sharper, MD;  Location: [facility] INVASIVE CV LAB;  Service: Cardiovascular;  Laterality: N/A;   ROBOT ASSISTED LAPAROSCOPIC RADICAL PROSTATECTOMY N/A 11/19/2019   Procedure: XI ROBOTIC ASSISTED LAPAROSCOPIC RADICAL PROSTATECTOMY WITH LYMPH NODE DISSECTION;  Surgeon: Penne Knee, MD;  Location: [facility] ORS;  Service: Urology;  Laterality: N/A;   SHOULDER ARTHROSCOPY WITH ROTATOR CUFF REPAIR AND SUBACROMIAL DECOMPRESSION Right 05/21/2022   Procedure: Right shoulder arthroscopic rotator cuff repair (supraspinatus & subscapularis), subacromial decompression, and biceps tenodesis;  Surgeon: Tobie Priest, MD;  Location: [facility] ORS;  Service: Orthopedics;  Laterality: Right;   VASECTOMY     WRIST GANGLION EXCISION Left 1984    SOCIAL HISTORY: Social History   Socioeconomic History   Marital status: Married    Spouse name: Nena   Number of children: 2   Years of education: Not on file   Highest education level: Bachelor's degree (e.g., BA, AB, BS)  Occupational History   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  Tobacco Use   Smoking status: Former    Current packs/day: 0.00    Average packs/day: 1 pack/day for 27.0 years (27.0 ttl pk-yrs)    Types: Cigarettes    Start date: 10/18/1982    Quit date: 10/18/2009    Years since quitting: 14.6   Smokeless tobacco: Former    Types: Chew    Quit date: 10/18/1994  Vaping Use   Vaping status: Never Used  Substance and Sexual Activity   Alcohol  use: Yes    Alcohol /week: 21.0 standard drinks of alcohol     Types: 21 Cans of beer per week    Comment: 6 -8 beers daily   Drug use: Never   Sexual activity: Not on file  Other Topics Concern   Not on file  Social History Narrative   Not on file   Social Drivers of Health    Financial Resource Strain: Low Risk  (04/19/2024)   Overall Financial Resource Strain (CARDIA)    Difficulty of Paying Living Expenses: Not very hard  Food Insecurity: No Food Insecurity (04/19/2024)   Hunger Vital Sign    Worried About Running Out of Food in the Last Year: Never true    Ran Out of Food in the Last Year: Never true  Transportation Needs: No Transportation Needs (04/19/2024)   PRAPARE - Administrator, Civil Service (Medical): No    Lack of Transportation (Non-Medical): No  Physical Activity: Inactive (12/26/2023)   Exercise Vital Sign    Days of Exercise per Week: 0 days    Minutes of Exercise per Session: 150+ min  Stress: Stress Concern Present (04/19/2024)   [employer] of Occupational Health - Occupational Stress Questionnaire    Feeling of Stress: Rather much  Social Connections: Moderately Isolated (12/26/2023)   Social Connection and Isolation Panel  Frequency of Communication with Friends and Family: Once a week    Frequency of Social Gatherings with Friends and Family: Never    Attends Religious Services: 1 to 4 times per year    Active Member of [employer] or Organizations: No    Attends Engineer, structural: Not on file    Marital Status: Married  Catering manager Violence: Not At Risk (04/19/2024)   Humiliation, Afraid, Rape, and Kick questionnaire    Fear of Current or Ex-Partner: No    Emotionally Abused: No    Physically Abused: No    Sexually Abused: No    FAMILY HISTORY: Family History  Problem Relation Age of Onset   Diabetes Mother    Hyperlipidemia Mother    Bladder Cancer Father    Liver cancer Sister    Diabetes Sister    Diabetes Maternal Aunt    Arthritis Maternal Grandmother    Diabetes Maternal Grandmother    Arthritis Maternal Grandfather    Arthritis Paternal Grandmother    Diabetes Brother    Heart disease Brother    Colon cancer Neg Hx     ALLERGIES:  is allergic to tape.  MEDICATIONS:  Current  Outpatient Medications  Medication Sig Dispense Refill   Alcohol  Swabs (ALCOHOL  PREP) PADS 1 each by Does not apply route 2 (two) times daily. 200 each 3   amLODipine  (NORVASC ) 10 MG tablet Take 1 tablet (10 mg total) by mouth daily. 90 tablet 0   aspirin  325 MG tablet Take 325 mg by mouth daily.     atorvastatin  (LIPITOR ) 40 MG tablet Take 1 tablet (40 mg total) by mouth daily. 90 tablet 1   Blood Glucose Monitoring Suppl (BLOOD GLUCOSE MONITOR SYSTEM) w/Device KIT Use as directed to check blood sugar once daily. 1 kit 0   buPROPion  (WELLBUTRIN  XL) 300 MG 24 hr tablet Take 1 tablet (300 mg total) by mouth daily. 90 tablet 0   celecoxib  (CELEBREX ) 200 MG capsule Take 1 capsule (200 mg total) by mouth every 12 (twelve) hours. 28 capsule 0   citalopram  (CELEXA ) 40 MG tablet Take 1 tablet (40 mg total) by mouth daily. 90 tablet 0   ezetimibe  (ZETIA ) 10 MG tablet Take 1 tablet (10 mg total) by mouth daily. 90 tablet 0   glipiZIDE  (GLUCOTROL  XL) 10 MG 24 hr tablet Take 1 tablet (10 mg total) by mouth daily with breakfast. 90 tablet 0   Glucose Blood (BLOOD GLUCOSE TEST STRIPS) STRP Use to check blood sugar once a day. 100 strip 1   Lancets (FREESTYLE) lancets Use as directed to check blood sugar once daily. 100 each 1   methocarbamol  (ROBAXIN ) 500 MG tablet Take 1 tablet (500 mg total) by mouth every 8 (eight) hours as needed for muscle spasms. 30 tablet 0   pantoprazole  (PROTONIX ) 40 MG tablet Take 1 tablet (40 mg total) by mouth daily. 90 tablet 1   tadalafil  (CIALIS ) 20 MG tablet Take 1 tablet (20 mg total) by mouth daily as needed for erectile dysfunction. 30 tablet 11   tirzepatide  (MOUNJARO ) 7.5 MG/0.5ML Pen Inject 7.5 mg into the skin once a week. 6 mL 1   traZODone  (DESYREL ) 100 MG tablet Take 2 tablets (200 mg total) by mouth at bedtime. 180 tablet 1   No current facility-administered medications for this visit.     Review of Systems  Constitutional:  Negative for appetite change,  chills, fatigue, fever and unexpected weight change.  HENT:   Negative for hearing  loss and voice change.   Eyes:  Negative for eye problems and icterus.  Respiratory:  Negative for chest tightness, cough and shortness of breath.   Cardiovascular:  Negative for chest pain and leg swelling.  Gastrointestinal:  Negative for abdominal distention and abdominal pain.  Endocrine: Negative for hot flashes.  Genitourinary:  Negative for difficulty urinating, dysuria and frequency.   Musculoskeletal:  Negative for arthralgias.  Skin:  Negative for itching and rash.  Neurological:  Negative for light-headedness and numbness.  Hematological:  Negative for adenopathy. Does not bruise/bleed easily.  Psychiatric/Behavioral:  Negative for confusion.     PHYSICAL EXAMINATION: ECOG PERFORMANCE STATUS: 0 - Asymptomatic Vitals:   06/07/24 1337  BP: 129/81  Pulse: 99  Resp: 18  Temp: (!) 96.4 F (35.8 C)  SpO2: 97%   Filed Weights   06/07/24 1337  Weight: 219 lb 11.2 oz (99.7 kg)     Physical Exam Constitutional:      General: He is not in acute distress. HENT:     Head: Normocephalic and atraumatic.  Eyes:     General: No scleral icterus. Cardiovascular:     Rate and Rhythm: Normal rate and regular rhythm.     Heart sounds: Normal heart sounds.  Pulmonary:     Effort: Pulmonary effort is normal. No respiratory distress.     Breath sounds: No wheezing.  Abdominal:     General: Bowel sounds are normal. There is no distension.     Palpations: Abdomen is soft.  Musculoskeletal:        General: No deformity. Normal range of motion.     Cervical back: Normal range of motion and neck supple.  Skin:    General: Skin is warm and dry.     Findings: No erythema or rash.  Neurological:     Mental Status: He is alert and oriented to person, place, and time. Mental status is at baseline.     Cranial Nerves: No cranial nerve deficit.     Coordination: Coordination normal.  Psychiatric:         Mood and Affect: Mood normal.     RADIOGRAPHIC STUDIES: I have personally reviewed the radiological images as listed and agreed with the findings in the report. No results found.   LABORATORY DATA:  I have reviewed the data as listed    Latest Ref Rng & Units 04/19/2024   11:54 AM 04/09/2024   10:07 AM 12/26/2023    9:51 AM  CBC  WBC 4.0 - 10.5 K/uL 3.4  2.8  2.7   Hemoglobin 13.0 - 17.0 g/dL 85.8  86.9  88.1   Hematocrit 39.0 - 52.0 % 42.2  41.9  36.8   Platelets 150 - 400 K/uL 266  235  255       Latest Ref Rng & Units 04/09/2024   10:07 AM 12/26/2023    9:51 AM 09/27/2023    9:49 AM  CMP  Glucose 65 - 99 mg/dL 859  785  744   BUN 7 - 25 mg/dL 19  15  19    Creatinine 0.70 - 1.35 mg/dL 9.24  9.25  9.02   Sodium 135 - 146 mmol/L 140  138  136   Potassium 3.5 - 5.3 mmol/L 4.4  4.1  4.3   Chloride 98 - 110 mmol/L 106  106  99   CO2 20 - 32 mmol/L 25  24  26    Calcium  8.6 - 10.3 mg/dL 9.4  9.4  9.5  Total Protein 6.1 - 8.1 g/dL 6.6  6.7    Total Bilirubin 0.2 - 1.2 mg/dL 0.4  0.4    AST 10 - 35 U/L 16  18    ALT 9 - 46 U/L 16  20         RADIOGRAPHIC STUDIES: I have personally reviewed the radiological images as listed and agreed with the findings in the report. No results found.

## 2024-06-19 DIAGNOSIS — F331 Major depressive disorder, recurrent, moderate: Secondary | ICD-10-CM | POA: Diagnosis not present

## 2024-06-19 DIAGNOSIS — F419 Anxiety disorder, unspecified: Secondary | ICD-10-CM | POA: Diagnosis not present

## 2024-06-26 DIAGNOSIS — F331 Major depressive disorder, recurrent, moderate: Secondary | ICD-10-CM | POA: Diagnosis not present

## 2024-06-26 DIAGNOSIS — F419 Anxiety disorder, unspecified: Secondary | ICD-10-CM | POA: Diagnosis not present

## 2024-06-27 ENCOUNTER — Other Ambulatory Visit: Payer: Self-pay

## 2024-06-27 DIAGNOSIS — C61 Malignant neoplasm of prostate: Secondary | ICD-10-CM

## 2024-06-28 ENCOUNTER — Other Ambulatory Visit

## 2024-06-28 DIAGNOSIS — C61 Malignant neoplasm of prostate: Secondary | ICD-10-CM

## 2024-06-29 ENCOUNTER — Other Ambulatory Visit: Payer: Self-pay

## 2024-06-29 DIAGNOSIS — M4802 Spinal stenosis, cervical region: Secondary | ICD-10-CM

## 2024-06-29 DIAGNOSIS — M5412 Radiculopathy, cervical region: Secondary | ICD-10-CM

## 2024-06-29 LAB — PSA: Prostate Specific Ag, Serum: <0.1 ng/mL (ref 0.0–4.0)

## 2024-07-03 ENCOUNTER — Ambulatory Visit
Admission: RE | Admit: 2024-07-03 | Discharge: 2024-07-03 | Disposition: A | Discharge status: Home / Self Care | Source: Ambulatory Visit | Attending: Neurosurgery | Admitting: Neurosurgery

## 2024-07-03 ENCOUNTER — Encounter: Payer: Self-pay | Admitting: Neurosurgery

## 2024-07-03 ENCOUNTER — Ambulatory Visit
Admission: RE | Admit: 2024-07-03 | Discharge: 2024-07-03 | Disposition: A | Discharge status: Home / Self Care | Attending: Neurosurgery | Admitting: Neurosurgery

## 2024-07-03 ENCOUNTER — Ambulatory Visit: Admitting: Neurosurgery

## 2024-07-03 VITALS — BP 130/72 | Ht 71.0 in | Wt 217.0 lb

## 2024-07-03 DIAGNOSIS — M5412 Radiculopathy, cervical region: Secondary | ICD-10-CM

## 2024-07-03 DIAGNOSIS — M4802 Spinal stenosis, cervical region: Secondary | ICD-10-CM | POA: Insufficient documentation

## 2024-07-03 DIAGNOSIS — F331 Major depressive disorder, recurrent, moderate: Secondary | ICD-10-CM | POA: Diagnosis not present

## 2024-07-03 DIAGNOSIS — Z09 Encounter for follow-up examination after completed treatment for conditions other than malignant neoplasm: Secondary | ICD-10-CM

## 2024-07-03 DIAGNOSIS — M5010 Cervical disc disorder with radiculopathy, unspecified cervical region: Secondary | ICD-10-CM | POA: Diagnosis not present

## 2024-07-03 DIAGNOSIS — F419 Anxiety disorder, unspecified: Secondary | ICD-10-CM | POA: Diagnosis not present

## 2024-07-03 NOTE — Progress Notes (Signed)
   REFERRING PHYSICIAN:  Antonette Angeline LELON [place] [address] [place],  [place] [address]  DOS: 10/10/23 C4-6 arthroplasty  HISTORY OF PRESENT ILLNESS:   07/03/2024 He is doing well.  He has occasional numbness in his hands when he lays down.  He thinks this may be due to how he is laying in bed.  His neck is sometimes a little stiff.  Overall, he is still much improved compared to before surgery.  12/29/23 Marc Aguilar presenting today about 2.5 months out from surgery. He is overall doing well from a cervical standpoint.  He still discomfort in his neck as some hand numbness but admits he feels better than he did before surgery.  He continues to have low back and right leg pain.  He had an MRI about a month ago of his lumbar spine which did not show any significant compression in his lower back.  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 Overall, he is doing well.  His symptoms have improved.  He still having some neck discomfort.  His swallowing has improved.  He has some hoarseness in his voice.    He has been having pain down his right leg particularly with walking for the past 6 to 8 months.  This bothers him when he stands or walks.  It is always on his right leg down the lateral and posterior aspect of his thigh to his anterolateral calf.  He denies weakness.   PHYSICAL EXAMINATION:  NEUROLOGICAL:  General: In no acute distress.   Awake, alert, oriented to person, place, and time.  Pupils equal round and reactive to light.    Strength: Side Biceps Triceps Deltoid Interossei Grip Wrist Ext. Wrist Flex.  R 5 5 5 5 5 5 5   L 5 5 5 5 5 5 5   5  out of 5 strength in his bilateral lower extremities.    Sensation intact to light touch  Incision c/d/I and healing well  Imaging:  No complications noted on today's imaging  Assessment / Plan: Marc Aguilar is doing well after arthroplasty.  He is doing very well.  He will let me know if his symptoms worsen at any time.  I am  pleased with his response to surgery.    Reeves Daisy MD Dept of Neurosurgery

## 2024-07-04 DIAGNOSIS — G4733 Obstructive sleep apnea (adult) (pediatric): Secondary | ICD-10-CM | POA: Diagnosis not present

## 2024-07-05 DIAGNOSIS — Z79899 Other long term (current) drug therapy: Secondary | ICD-10-CM | POA: Diagnosis not present

## 2024-07-05 DIAGNOSIS — R41840 Attention and concentration deficit: Secondary | ICD-10-CM | POA: Diagnosis not present

## 2024-07-09 DIAGNOSIS — R41840 Attention and concentration deficit: Secondary | ICD-10-CM | POA: Diagnosis not present

## 2024-07-09 DIAGNOSIS — Z79899 Other long term (current) drug therapy: Secondary | ICD-10-CM | POA: Diagnosis not present

## 2024-07-10 ENCOUNTER — Encounter: Payer: Self-pay | Admitting: Cardiology

## 2024-07-10 DIAGNOSIS — F419 Anxiety disorder, unspecified: Secondary | ICD-10-CM | POA: Diagnosis not present

## 2024-07-10 DIAGNOSIS — F331 Major depressive disorder, recurrent, moderate: Secondary | ICD-10-CM | POA: Diagnosis not present

## 2024-07-13 ENCOUNTER — Encounter: Payer: Self-pay | Admitting: Internal Medicine

## 2024-07-17 DIAGNOSIS — F419 Anxiety disorder, unspecified: Secondary | ICD-10-CM | POA: Diagnosis not present

## 2024-07-17 DIAGNOSIS — F331 Major depressive disorder, recurrent, moderate: Secondary | ICD-10-CM | POA: Diagnosis not present

## 2024-07-24 DIAGNOSIS — F419 Anxiety disorder, unspecified: Secondary | ICD-10-CM | POA: Diagnosis not present

## 2024-07-24 DIAGNOSIS — F331 Major depressive disorder, recurrent, moderate: Secondary | ICD-10-CM | POA: Diagnosis not present

## 2024-07-30 ENCOUNTER — Other Ambulatory Visit: Payer: Self-pay | Admitting: Internal Medicine

## 2024-07-30 ENCOUNTER — Other Ambulatory Visit: Payer: Self-pay | Admitting: Physician Assistant

## 2024-07-30 ENCOUNTER — Other Ambulatory Visit: Payer: Self-pay

## 2024-07-30 MED ORDER — CELECOXIB 200 MG PO CAPS
200.0000 mg | ORAL_CAPSULE | Freq: Two times a day (BID) | ORAL | 0 refills | Status: AC
  Filled 2024-07-30 (×2): qty 28, 14d supply, fill #0

## 2024-07-30 MED ORDER — METHOCARBAMOL 500 MG PO TABS
500.0000 mg | ORAL_TABLET | Freq: Three times a day (TID) | ORAL | 0 refills | Status: DC | PRN
  Filled 2024-07-30: qty 30, 10d supply, fill #0

## 2024-07-31 ENCOUNTER — Other Ambulatory Visit: Payer: Self-pay | Admitting: Internal Medicine

## 2024-07-31 ENCOUNTER — Other Ambulatory Visit: Payer: Self-pay

## 2024-07-31 DIAGNOSIS — F419 Anxiety disorder, unspecified: Secondary | ICD-10-CM | POA: Diagnosis not present

## 2024-07-31 DIAGNOSIS — F331 Major depressive disorder, recurrent, moderate: Secondary | ICD-10-CM | POA: Diagnosis not present

## 2024-08-01 ENCOUNTER — Other Ambulatory Visit: Payer: Self-pay

## 2024-08-02 ENCOUNTER — Other Ambulatory Visit: Payer: Self-pay

## 2024-08-02 MED FILL — Citalopram Hydrobromide Tab 40 MG (Base Equiv): ORAL | 90 days supply | Qty: 90 | Fill #0 | Status: AC

## 2024-08-02 NOTE — Telephone Encounter (Signed)
 Requested by interface surescripts. Last OV 04/09/24 future visit 10/08/24.  Requested Prescriptions  Pending Prescriptions Disp Refills   citalopram  (CELEXA ) 40 MG tablet 90 tablet 0    Sig: Take 1 tablet (40 mg total) by mouth daily.     Psychiatry:  Antidepressants - SSRI Passed - 08/02/2024  1:21 PM      Passed - Completed PHQ-2 or PHQ-9 in the last 360 days      Passed - Valid encounter within last 6 months    Recent Outpatient Visits           3 months ago Encounter for general adult medical examination with abnormal findings   [place] [place], [place] W, NP   7 months ago Type 2 diabetes mellitus with hyperglycemia, without long-term current use of insulin  [facility])   [place] [facility] [place], Angeline ORN, NP   8 months ago Cervical radiculitis   [place] [facility] [place], Angeline ORN, NP       Future Appointments             In 1 month [place], Maude HERO, MD [facility] A Dept of [employer] [facility] [employer], [employer]   In 4 months Garren, Penne SAUNDERS, MD [facility] Urology [facility]

## 2024-08-20 ENCOUNTER — Other Ambulatory Visit: Payer: Self-pay | Admitting: *Deleted

## 2024-08-20 ENCOUNTER — Inpatient Hospital Stay: Attending: Radiation Oncology

## 2024-08-20 DIAGNOSIS — C61 Malignant neoplasm of prostate: Secondary | ICD-10-CM

## 2024-08-20 LAB — PSA: Prostatic Specific Antigen: <0.02 ng/mL (ref 0.00–4.00)

## 2024-08-24 ENCOUNTER — Other Ambulatory Visit: Payer: Self-pay | Admitting: Internal Medicine

## 2024-08-24 DIAGNOSIS — Z0001 Encounter for general adult medical examination with abnormal findings: Secondary | ICD-10-CM

## 2024-08-27 ENCOUNTER — Other Ambulatory Visit: Payer: Self-pay

## 2024-08-27 ENCOUNTER — Encounter: Payer: Self-pay | Admitting: Radiation Oncology

## 2024-08-27 ENCOUNTER — Other Ambulatory Visit: Payer: Self-pay | Admitting: *Deleted

## 2024-08-27 ENCOUNTER — Ambulatory Visit
Admission: RE | Admit: 2024-08-27 | Discharge: 2024-08-27 | Disposition: A | Discharge status: Home / Self Care | Source: Ambulatory Visit | Attending: Radiation Oncology | Admitting: Radiation Oncology

## 2024-08-27 VITALS — BP 137/88 | HR 101 | Temp 98.6°F | Resp 20 | Wt 222.0 lb

## 2024-08-27 DIAGNOSIS — C61 Malignant neoplasm of prostate: Secondary | ICD-10-CM | POA: Diagnosis not present

## 2024-08-27 DIAGNOSIS — Z191 Hormone sensitive malignancy status: Secondary | ICD-10-CM | POA: Diagnosis not present

## 2024-08-27 DIAGNOSIS — Z923 Personal history of irradiation: Secondary | ICD-10-CM | POA: Diagnosis not present

## 2024-08-27 DIAGNOSIS — R351 Nocturia: Secondary | ICD-10-CM | POA: Diagnosis not present

## 2024-08-27 DIAGNOSIS — Z9079 Acquired absence of other genital organ(s): Secondary | ICD-10-CM | POA: Insufficient documentation

## 2024-08-27 MED ORDER — AMLODIPINE BESYLATE 10 MG PO TABS
10.0000 mg | ORAL_TABLET | Freq: Every day | ORAL | 0 refills | Status: AC
  Filled 2024-08-27: qty 90, 90d supply, fill #0

## 2024-08-27 MED ORDER — EZETIMIBE 10 MG PO TABS
10.0000 mg | ORAL_TABLET | Freq: Every day | ORAL | 0 refills | Status: AC
  Filled 2024-08-27: qty 90, 90d supply, fill #0

## 2024-08-27 MED ORDER — BUPROPION HCL ER (XL) 300 MG PO TB24
300.0000 mg | ORAL_TABLET | Freq: Every day | ORAL | 0 refills | Status: AC
  Filled 2024-08-27: qty 90, 90d supply, fill #0

## 2024-08-27 MED ORDER — TRAZODONE HCL 100 MG PO TABS
200.0000 mg | ORAL_TABLET | Freq: Every day | ORAL | 1 refills | Status: AC
Start: 2024-08-27 — End: ?
  Filled 2024-08-27: qty 180, 90d supply, fill #0

## 2024-08-27 MED ORDER — GLIPIZIDE ER 10 MG PO TB24
10.0000 mg | ORAL_TABLET | Freq: Every day | ORAL | 0 refills | Status: AC
  Filled 2024-08-27: qty 90, 90d supply, fill #0

## 2024-08-27 NOTE — Progress Notes (Signed)
 Radiation Oncology Follow up Note  Name: Marc Aguilar   Date:   08/27/2024 MRN:  [id number] DOB: [birth year]    This 63 y.o. 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  REFERRING PROVIDER: Antonette Angeline ORN, NP  HPI: Patient is a 63 year old male now out 10 months having completed salvage radiation therapy status post robotic prostatectomy for stage IIIb Gleason 7 adenocarcinoma seen today in routine follow-up he still has some nocturia.  No dysuria.  Bowels are functioning well.  His most recent PSA is 0.02 they have changed the bottom level of the PSA test to 0.02 and I have assured him he is in complete remission at this time.  Patient did have plain films of the cervical spine showing disc arthroplasty at C4-5 C5-6 with hardware showing superior endplate slightly protruding at C6.  He is being followed by Dr. Katrina for that.  COMPLICATIONS OF TREATMENT: none  FOLLOW UP COMPLIANCE: keeps appointments   PHYSICAL EXAM:  BP 137/88   Pulse (!) 101   Temp 98.6 F (37 C) (Tympanic)   Resp 20   Wt 222 lb (100.7 kg)   BMI 30.96 kg/m  Well-developed well-nourished patient in NAD. HEENT reveals PERLA, EOMI, discs not visualized.  Oral cavity is clear. No oral mucosal lesions are identified. Neck is clear without evidence of cervical or supraclavicular adenopathy. Lungs are clear to A&P. Cardiac examination is essentially unremarkable with regular rate and rhythm without murmur rub or thrill. Abdomen is benign with no organomegaly or masses noted. Motor sensory and DTR levels are equal and symmetric in the upper and lower extremities. Cranial nerves II through XII are grossly intact. Proprioception is intact. No peripheral adenopathy or edema is  identified. No motor or sensory levels are noted. Crude visual fields are within normal range.  RADIOLOGY RESULTS: Plain films of the C-spine reviewed  PLAN: Present time patient is doing well under excellent biochemical control status post salvage radiation therapy for prostate cancer.  Have asked to see him back in 6 months for follow-up.  Should he continue incomplete remission by biochemical PSA we will go to once a year follow-up visits.  Patient comprehends my recommendations well.  He knows to call with any concerns.  I would like to take this opportunity to thank you for allowing me to participate in the care of your patient.SABRA Marcey Penton, MD

## 2024-08-27 NOTE — Telephone Encounter (Signed)
 Requested Prescriptions  Pending Prescriptions Disp Refills   traZODone  (DESYREL ) 100 MG tablet 180 tablet 1    Sig: Take 2 tablets (200 mg total) by mouth at bedtime.     Psychiatry: Antidepressants - Serotonin Modulator Passed - 08/27/2024 12:55 PM      Passed - Completed PHQ-2 or PHQ-9 in the last 360 days      Passed - Valid encounter within last 6 months    Recent Outpatient Visits           4 months ago Encounter for general adult medical examination with abnormal findings   [facility] [place], [place] W, NP   8 months ago Type 2 diabetes mellitus with hyperglycemia, without long-term current use of insulin  [facility])   [facility] [place], Angeline ORN, NP   9 months ago Cervical radiculitis   [facility] [facility] [place], Angeline ORN, NP       Future Appointments             In 1 week Jordan, Maude HERO, MD [facility] at [facility] A Dept of [employer], [facility]   In 3 months Garren, Penne SAUNDERS, MD [facility] [employer]             amLODipine  (NORVASC ) 10 MG tablet 90 tablet 0    Sig: Take 1 tablet (10 mg total) by mouth daily.     Cardiovascular: Calcium  Channel Blockers 2 Passed - 08/27/2024 12:55 PM      Passed - Last BP in normal range    BP Readings from Last 1 Encounters:  08/27/24 137/88         Passed - Last Heart Rate in normal range    Pulse Readings from Last 1 Encounters:  08/27/24 (!) 101         Passed - Valid encounter within last 6 months    Recent Outpatient Visits           4 months ago Encounter for general adult medical examination with abnormal findings   [facility] [place], [place] W, NP   8 months ago Type 2 diabetes mellitus with hyperglycemia, without long-term current use of insulin  [facility])   [facility] [place], Angeline ORN, NP   9 months ago Cervical radiculitis   [facility]  [place], Angeline ORN, NP       Future Appointments             In 1 week Jordan, Maude HERO, MD [facility] at [facility] A Dept of [employer], [facility]   In 3 months Garren, Penne SAUNDERS, MD [facility] [place]             buPROPion  (WELLBUTRIN  XL) 300 MG 24 hr tablet 90 tablet 0    Sig: Take 1 tablet (300 mg total) by mouth daily.     Psychiatry: Antidepressants - bupropion  Passed - 08/27/2024 12:55 PM      Passed - Cr in normal range and within 360 days    Creat  Date Value Ref Range Status  04/09/2024 0.75 0.70 - 1.35 mg/dL Final   Creatinine, Urine  Date Value Ref Range Status  04/09/2024 89 20 - 320 mg/dL Final         Passed - AST in normal range and within 360 days  AST  Date Value Ref Range Status  04/09/2024 16 10 - 35 U/L Final         Passed - ALT in normal range and within 360 days    ALT  Date Value Ref Range Status  04/09/2024 16 9 - 46 U/L Final         Passed - Completed PHQ-2 or PHQ-9 in the last 360 days      Passed - Last BP in normal range    BP Readings from Last 1 Encounters:  08/27/24 137/88         Passed - Valid encounter within last 6 months    Recent Outpatient Visits           4 months ago Encounter for general adult medical examination with abnormal findings   [facility] [place], [place] W, NP   8 months ago Type 2 diabetes mellitus with hyperglycemia, without long-term current use of insulin  [facility])   [facility] [place], Angeline ORN, NP   9 months ago Cervical radiculitis   [facility] [place], Angeline ORN, NP       Future Appointments             In 1 week Jordan, Maude HERO, MD [facility] at [facility] A Dept of [employer], H[facility]   In 3 months Garren, Penne SAUNDERS, MD [facility]rology              ezetimibe  (ZETIA ) 10 MG tablet 90 tablet 0    Sig: Take 1 tablet (10 mg total) by mouth daily.     Cardiovascular:   Antilipid - Sterol Transport Inhibitors Failed - 08/27/2024 12:55 PM      Failed - Lipid Panel in normal range within the last 12 months    Cholesterol, Total  Date Value Ref Range Status  10/26/2019 157 100 - 199 mg/dL Final   Cholesterol  Date Value Ref Range Status  04/09/2024 178 <200 mg/dL Final   LDL Cholesterol (Calc)  Date Value Ref Range Status  04/09/2024 90 mg/dL (calc) Final    Comment:    Reference range: <100 . Desirable range <100 mg/dL for primary prevention;   <70 mg/dL for patients with CHD or diabetic patients  with > or = 2 CHD risk factors. SABRA LDL-C is now calculated using the Martin-Hopkins  calculation, which is a validated novel method providing  better accuracy than the Friedewald equation in the  estimation of LDL-C.  Gladis APPLETHWAITE et al. SANDREA. 7986;689(80): 2061-2068  (http://education.QuestDiagnostics.com/faq/FAQ164)    Direct LDL  Date Value Ref Range Status  01/01/2021 127.0 mg/dL Final    Comment:    Optimal:  <100 mg/dLNear or Above Optimal:  100-129 mg/dLBorderline High:  130-159 mg/dLHigh:  160-189 mg/dLVery High:  >190 mg/dL   HDL  Date Value Ref Range Status  04/09/2024 50 > OR = 40 mg/dL Final  98/91/7978 48 >60 mg/dL Final   Triglycerides  Date Value Ref Range Status  04/09/2024 319 (H) <150 mg/dL Final    Comment:    . If a non-fasting specimen was collected, consider repeat triglyceride testing on a fasting specimen if clinically indicated.  Veatrice et al. J. of Clin. Lipidol. 2015;9:129-169. SABRA          Passed - AST in normal range and within 360 days    AST  Date Value Ref Range Status  04/09/2024  16 10 - 35 U/L Final         Passed - ALT in normal range and within 360 days    ALT  Date Value Ref Range Status  04/09/2024 16 9 - 46 U/L Final         Passed - Patient is not pregnant      Passed - Valid encounter within last 12 months    Recent Outpatient Visits           4 months ago Encounter for general adult  medical examination with abnormal findings   [facility] [place], [place] W, NP   8 months ago Type 2 diabetes mellitus with hyperglycemia, without long-term current use of insulin  [facility])   [facility] [place], Angeline ORN, NP   9 months ago Cervical radiculitis   [place] [facility] [place], Angeline ORN, NP       Future Appointments             In 1 week Jordan, Maude HERO, MD [facility] at [facility] A Dept of [employer], H&V   In 3 months Garren, Penne SAUNDERS, MD [facility] [place]             glipiZIDE  (GLUCOTROL  XL) 10 MG 24 hr tablet 90 tablet 0    Sig: Take 1 tablet (10 mg total) by mouth daily with breakfast.     Endocrinology:  Diabetes - Sulfonylureas Passed - 08/27/2024 12:55 PM      Passed - HBA1C is between 0 and 7.9 and within 180 days    Hgb A1c MFr Bld  Date Value Ref Range Status  04/09/2024 5.7 (H) <5.7 % Final    Comment:    For someone without known diabetes, a hemoglobin  A1c value between 5.7% and 6.4% is consistent with prediabetes and should be confirmed with a  follow-up test. . For someone with known diabetes, a value <7% indicates that their diabetes is well controlled. A1c targets should be individualized based on duration of diabetes, age, comorbid conditions, and other considerations. . This assay result is consistent with an increased risk of diabetes. . Currently, no consensus exists regarding use of hemoglobin A1c for diagnosis of diabetes for children. .          Passed - Cr in normal range and within 360 days    Creat  Date Value Ref Range Status  04/09/2024 0.75 0.70 - 1.35 mg/dL Final   Creatinine, Urine  Date Value Ref Range Status  04/09/2024 89 20 - 320 mg/dL Final         Passed - Valid encounter within last 6 months    Recent Outpatient Visits           4 months ago Encounter for general adult medical examination with abnormal  findings   [facility] [place], [place] W, NP   8 months ago Type 2 diabetes mellitus with hyperglycemia, without long-term current use of insulin  [facility])   [facility] [place], Angeline ORN, NP   9 months ago Cervical radiculitis   [facility] [place], Angeline ORN, NP       Future Appointments             In 1 week Jordan, Maude HERO, MD [facility] at [facility] A Dept of [employer],  H&V   In 3 months Garren, Penne SAUNDERS, MD [facility] Urology [facility]

## 2024-08-30 NOTE — Progress Notes (Signed)
 Cardiology Office Note:    Date:  09/03/2024   ID:  Marc Aguilar, DOB Oct 06, 1961, MRN [id number]  PCP:  Antonette Angeline ORN, NP   [facility] Providers Cardiologist:  Etheleen Valtierra, MD     Referring MD: Antonette Angeline ORN, NP   Chief Complaint  Patient presents with   Chest Pain    History of Present Illness:    Marc Aguilar is a 63 y.o. male is seen for follow up evaluation.  He was last seen for televisit in 2022. He has a hx of HLD, DM II, OSA on CPAP and BPH.  Patient was admitted on 02/28/2018 with right-sided chest pain radiating to his scapula.  He also reported a similar episode of chest pain in 2016 for which he was evaluated at [facility] and underwent nuclear stress test which showed mild reversible ischemia involving apical segment but no evidence of MI, EF was 56%.  This Myoview was later over read by cardiologist who felt that there was no signs of reversible ischemia.  He underwent cardiac catheterization on 02/28/2018 which showed widely patent coronary arteries with minimal coronary irregularities, normal EF with normal LVEDP.   Echocardiogram obtained on 03/01/2018 also showed EF 65 to 70%, mild LVH.  He was started on low-dose Norvasc .  ABI obtained on 04/04/2018 showed noncompressible arteries, however triphasic blood flow throughout.   He has a history of prostate CA.  Prostate biopsy revealed evidence of high risk prostate cancer.  Metastatic evaluation includes CT abdomen pelvis with contrast as well as bone scan were negative for any evidence of metastatic disease.  He is s/p  robotic assisted laparoscopic prostatectomy on 11/19/2019 by Dr. Rosina Riis of [facility] associates. Pre op ETT in 2021 was low risk.   He notes he is being considered for Rx of ADHD per psychiatry. Wanted cardiac clearance first. Denies any palpitations, dizziness, chest pain or SOB. Is on Monjauro now for DM. BP well controlled.     Past Medical History:  Diagnosis Date   Anxiety     Arthritis    knees and hips (02/28/2018)   Cervical radiculitis    Chicken pox    Depression    GERD (gastroesophageal reflux disease)    Heart murmur    Hernia, abdominal    High cholesterol    History of gout    History of hiatal hernia    Hyperlipidemia associated with type 2 diabetes mellitus (HCC)    Hypertension    Migraine    probably monthly (02/28/2018)   Motion sickness    ocean boats, circular motion   Neck pain    OSA on CPAP    Osteoarthritis    Prostate cancer (HCC) 2021   surgery and radiation treatments   Refusal of blood transfusions as patient is Jehovah's Witness    Seasonal allergies    Type II diabetes mellitus (HCC)     Past Surgical History:  Procedure Laterality Date   CARDIAC CATHETERIZATION  02/28/2018   ESOPHAGOGASTRODUODENOSCOPY (EGD) WITH PROPOFOL  N/A 03/17/2023   Procedure: ESOPHAGOGASTRODUODENOSCOPY (EGD) WITH PROPOFOL ;  Surgeon: Jinny Carmine, MD;  Location: [facility];  Service: Endoscopy;  Laterality: N/A;  Diabetic   ESOPHAGOGASTRODUODENOSCOPY (EGD) WITH PROPOFOL  N/A 04/18/2023   Procedure: ESOPHAGOGASTRODUODENOSCOPY (EGD) WITH PROPOFOL ;  Surgeon: Jinny Carmine, MD;  Location: [facility];  Service: Endoscopy;  Laterality: N/A;   LEFT HEART CATH AND CORONARY ANGIOGRAPHY N/A 02/28/2018   Procedure: LEFT HEART CATH AND CORONARY ANGIOGRAPHY;  Surgeon:  Wonda Sharper, MD;  Location: [facility];  Service: Cardiovascular;  Laterality: N/A;   ROBOT ASSISTED LAPAROSCOPIC RADICAL PROSTATECTOMY N/A 11/19/2019   Procedure: XI ROBOTIC ASSISTED LAPAROSCOPIC RADICAL PROSTATECTOMY WITH LYMPH NODE DISSECTION;  Surgeon: Penne Knee, MD;  Location: [facility] ORS;  Service: Urology;  Laterality: N/A;   SHOULDER ARTHROSCOPY WITH ROTATOR CUFF REPAIR AND SUBACROMIAL DECOMPRESSION Right 05/21/2022   Procedure: Right shoulder arthroscopic rotator cuff repair (supraspinatus & subscapularis), subacromial decompression, and biceps tenodesis;   Surgeon: Tobie Priest, MD;  Location: [facility] ORS;  Service: Orthopedics;  Laterality: Right;   VASECTOMY     WRIST GANGLION EXCISION Left 1984    Current Medications: Current Meds  Medication Sig   Alcohol  Swabs (ALCOHOL  PREP) PADS 1 each by Does not apply route 2 (two) times daily.   amLODipine  (NORVASC ) 10 MG tablet Take 1 tablet (10 mg total) by mouth daily.   aspirin  325 MG tablet Take 325 mg by mouth daily.   atorvastatin  (LIPITOR ) 40 MG tablet Take 1 tablet (40 mg total) by mouth daily.   Blood Glucose Monitoring Suppl (BLOOD GLUCOSE MONITOR SYSTEM) w/Device KIT Use as directed to check blood sugar once daily.   buPROPion  (WELLBUTRIN  XL) 300 MG 24 hr tablet Take 1 tablet (300 mg total) by mouth daily.   celecoxib  (CELEBREX ) 200 MG capsule Take 1 capsule (200 mg total) by mouth every 12 (twelve) hours.   citalopram  (CELEXA ) 40 MG tablet Take 1 tablet (40 mg total) by mouth daily.   ezetimibe  (ZETIA ) 10 MG tablet Take 1 tablet (10 mg total) by mouth daily.   glipiZIDE  (GLUCOTROL  XL) 10 MG 24 hr tablet Take 1 tablet (10 mg total) by mouth daily with breakfast.   Glucose Blood (BLOOD GLUCOSE TEST STRIPS) STRP Use to check blood sugar once a day.   Lancets (FREESTYLE) lancets Use as directed to check blood sugar once daily.   methocarbamol  (ROBAXIN ) 500 MG tablet Take 1 tablet (500 mg total) by mouth every 8 (eight) hours as needed for muscle spasms.   pantoprazole  (PROTONIX ) 40 MG tablet Take 1 tablet (40 mg total) by mouth daily.   tadalafil  (CIALIS ) 20 MG tablet Take 1 tablet (20 mg total) by mouth daily as needed for erectile dysfunction.   tirzepatide  (MOUNJARO ) 7.5 MG/0.5ML Pen Inject 7.5 mg into the skin once a week.   traZODone  (DESYREL ) 100 MG tablet Take 2 tablets (200 mg total) by mouth at bedtime.     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  Tobacco Use   Smoking status: Former    Current packs/day: 0.00    Average packs/day: 1 pack/day for 27.0 years (27.0 ttl pk-yrs)    Types: Cigarettes    Start date: 10/18/1982    Quit date: 10/18/2009    Years since quitting: 14.8   Smokeless tobacco: Former    Types: Chew    Quit date: 10/18/1994  Vaping Use   Vaping status: Never Used  Substance and Sexual Activity   Alcohol  use: Yes    Alcohol /week: 21.0 standard drinks of alcohol     Types: 21 Cans of beer per week    Comment: 6 -8 beers daily   Drug use: Never  Sexual activity: Not on file  Other Topics Concern   Not on file  Social History Narrative   Not on file   Social Drivers of Health   Financial Resource Strain: Low Risk  (04/19/2024)   Overall Financial Resource Strain (CARDIA)    Difficulty of Paying Living Expenses: Not very hard  Food Insecurity: No Food Insecurity (04/19/2024)   Hunger Vital Sign    Worried About Running Out of Food in the Last Year: Never true    Ran Out of Food in the Last Year: Never true  Transportation Needs: No Transportation Needs (04/19/2024)   PRAPARE - Administrator, Civil Service (Medical): No    Lack of Transportation (Non-Medical): No  Physical Activity: Inactive (12/26/2023)   Exercise Vital Sign    Days of Exercise per Week: 0 days    Minutes of Exercise per Session: 150+ min  Stress: Stress Concern Present (04/19/2024)   [employer] of Occupational Health - Occupational Stress Questionnaire    Feeling of Stress: Rather much  Social Connections: Moderately Isolated (12/26/2023)   Social Connection and Isolation Panel    Frequency of Communication with Friends and Family: Once a week    Frequency of Social Gatherings with Friends and Family: Never    Attends Religious Services: 1 to 4 times per year    Active Member of [employer] or Organizations: No     Attends Engineer, Structural: Not on file    Marital Status: Married     Family History: The patient's family history includes Arthritis in his maternal grandfather, maternal grandmother, and paternal grandmother; Bladder Cancer in his father; Diabetes in his brother, maternal aunt, maternal grandmother, mother, and sister; Heart disease in his brother; Hyperlipidemia in his mother; Liver cancer in his sister. There is no history of Colon cancer.  ROS:   Please see the history of present illness.     All other systems reviewed and are negative.  EKGs/Labs/Other Studies Reviewed:    The following studies were reviewed today: Cath 02/28/2018 The left ventricular ejection fraction is 55-65% by visual estimate. LV end diastolic pressure is normal. The left ventricular systolic function is normal.   1. Widely patent coronary arteries with minimal coronary irregularities 2. Normal LV systolic function with normal LVEDP   Diff dx includes coronary vasospasm versus noncardiac pain as most likely etiology of chest pain. Further management/evaluation per primary cardiology team.    ETT 10/31/19: Study Highlights   Patient walked for 9 minutes and 28 seconds on a standard Bruce protocol treadmill test. He achieved a peak heart rate of 166 which is 101% predicted maximal heart rate. He stopped due to some shortness of breath and fatigue. Blood pressure response to exercise was normal. At peak exercise there were very mild ST segment depressions which resolved very quickly during the recovery phase. There is no QRS widening with exercise. This is interpreted as a negative exercise test. There is no evidence of ischemia. He has good exercise capacity.   EKG Interpretation Date/Time:  Monday September 03 2024 16:13:05 EST Ventricular Rate:  84 PR Interval:  156 QRS Duration:  82 QT Interval:  384 QTC Calculation: 453 R Axis:   43  Text Interpretation: Normal sinus rhythm Normal ECG  When compared with ECG of 27-Sep-2023 10:24, No significant change was found Confirmed by Murray Guzzetta [phone]) on 09/03/2024 4:27:26 PM    Recent Labs: 04/09/2024: ALT 16; BUN 19; Creat 0.75; Potassium 4.4; Sodium 140 04/19/2024:  Hemoglobin 14.1; Platelets 266  Recent Lipid Panel    Component Value Date/Time   CHOL 178 04/09/2024 1007   CHOL 157 10/26/2019 1203   TRIG 319 (H) 04/09/2024 1007   HDL 50 04/09/2024 1007   HDL 48 10/26/2019 1203   CHOLHDL 3.6 04/09/2024 1007   VLDL 60.2 (H) 01/01/2021 1047   LDLCALC 90 04/09/2024 1007   LDLDIRECT 127.0 01/01/2021 1047   EKG Interpretation Date/Time:  Monday September 03 2024 16:13:05 EST Ventricular Rate:  84 PR Interval:  156 QRS Duration:  82 QT Interval:  384 QTC Calculation: 453 R Axis:   43  Text Interpretation: Normal sinus rhythm Normal ECG When compared with ECG of 27-Sep-2023 10:24, No significant change was found Confirmed by Iviana Blasingame [id number]) on 09/03/2024 4:27:26 PM    Risk Assessment/Calculations:                Physical Exam:    VS:  BP 120/64 (BP Location: Left Arm, Patient Position: Sitting)   Pulse 84   Ht 5' 11 (1.803 m)   Wt 218 lb 2 oz (98.9 kg)   BMI 30.42 kg/m     Wt Readings from Last 3 Encounters:  09/03/24 218 lb 2 oz (98.9 kg)  08/27/24 222 lb (100.7 kg)  07/03/24 217 lb (98.4 kg)     GEN:  Well nourished, well developed in no acute distress HEENT: Normal NECK: No JVD; No carotid bruits LYMPHATICS: No lymphadenopathy CARDIAC: RRR, no murmurs, rubs, gallops RESPIRATORY:  Clear to auscultation without rales, wheezing or rhonchi  ABDOMEN: Soft, non-tender, non-distended MUSCULOSKELETAL:  No edema; No deformity  SKIN: Warm and dry NEUROLOGIC:  Alert and oriented x 3 PSYCHIATRIC:  Normal affect   ASSESSMENT:    1. Primary hypertension   2. Hyperlipidemia associated with type 2 diabetes mellitus (HCC)    PLAN:    In order of problems listed above:  1. CV risk is very low based  on prior cardiac evaluation and lack of symptoms. Prior Echo, Myoview and cardiac cath ok. Ecg is normal. Ok to take medication for ADHD without restriction.    2. Hyperlipidemia: Continue Lipitor    3. DM2: Managed per primary care provider           Medication Adjustments/Labs and Tests Ordered: Current medicines are reviewed at length with the patient today.  Concerns regarding medicines are outlined above.  Orders Placed This Encounter  Procedures   EKG 12-Lead   No orders of the defined types were placed in this encounter.   Patient Instructions  Medication Instructions:  Continue same medications  Lab Work: None ordered   Testing/Procedures: None ordered  Follow-Up: At [facility], you and your health needs are our priority.  As part of our continuing mission to provide you with exceptional heart care, our providers are all part of one team.  This team includes your primary Cardiologist (physician) and Advanced Practice Providers or APPs (Physician Assistants and Nurse Practitioners) who all work together to provide you with the care you need, when you need it.  Your next appointment:  As Needed    Provider:  Dr.Sieara Bremer    We recommend signing up for the patient portal called MyChart.  Sign up information is provided on this After Visit Summary.  MyChart is used to connect with patients for Virtual Visits (Telemedicine).  Patients are able to view lab/test results, encounter notes, upcoming appointments, etc.  Non-urgent messages can be sent to your provider as well.  To learn more about what you can do with MyChart, go to [email or web address].     Signed, Donnajean Chesnut, MD  09/03/2024 4:38 PM    [facility]

## 2024-09-03 ENCOUNTER — Encounter: Payer: Self-pay | Admitting: Cardiology

## 2024-09-03 ENCOUNTER — Ambulatory Visit: Attending: Cardiology | Admitting: Cardiology

## 2024-09-03 VITALS — BP 120/64 | HR 84 | Ht 71.0 in | Wt 218.1 lb

## 2024-09-03 DIAGNOSIS — I10 Essential (primary) hypertension: Secondary | ICD-10-CM

## 2024-09-03 DIAGNOSIS — E1169 Type 2 diabetes mellitus with other specified complication: Secondary | ICD-10-CM

## 2024-09-03 DIAGNOSIS — E785 Hyperlipidemia, unspecified: Secondary | ICD-10-CM | POA: Diagnosis not present

## 2024-09-03 NOTE — Patient Instructions (Addendum)

## 2024-09-20 ENCOUNTER — Telehealth: Payer: Self-pay

## 2024-09-20 ENCOUNTER — Other Ambulatory Visit (HOSPITAL_COMMUNITY): Payer: Self-pay

## 2024-09-20 NOTE — Telephone Encounter (Signed)
 Pharmacy Patient Advocate Encounter   Received notification from Onbase that prior authorization for Mounjaro  7.5 is required/requested.   Insurance verification completed.   The patient is insured through [facility].   Per test claim: The current 28 day co-pay is, $25.00.  No PA needed at this time. This test claim was processed through [facility]- copay amounts may vary at other pharmacies due to pharmacy/plan contracts, or as the patient moves through the different stages of their insurance plan.     Patient has current approved PA that expires 04/10/25.

## 2024-09-27 ENCOUNTER — Other Ambulatory Visit (HOSPITAL_COMMUNITY): Payer: Self-pay

## 2024-10-07 NOTE — Progress Notes (Unsigned)
 "  Subjective:    Patient ID: Marc Aguilar, male    DOB: July 28, 1961, 63 y.o.   MRN: [id number]  HPI  Patient presents to clinic today for 81-month follow-up of chronic conditions.  Migraines: These occur rarely.  Triggered by neck pain, lack of sleep.  He takes tylenol  as needed with some relief of symptoms.  He does not follow with neurology.  OSA: He averages 4- 5 hours per night with the use of his CPAP (wears sometimes).  Sleep study from 11/2016 reviewed.  HLD: Seconday to diabetes. His last LDL was 90, triglycerides 680, 03/2024.  He denies myalgias on atorvastatin  and ezetimibe .  He tries to consume a low-fat diet.  HTN: His BP today is 120/70.  He is taking amlodipine  as prescribed.  ECG from 08/2024 reviewed.  DM2: His last A1c was 5.7%, 03/2024.  His sugars range 70-140.  He is taking glipizide  and tirzepatide  as prescribed.  He checks his feet routinely.  His last eye exam was 06/2023.  Flu 06/2023.  Pneumovax 10/2018. Prevnar 03/2024. COVID Pfizer x 3.  Anxiety and depression: Chronic, managed on citalopram  and bupropion .  He is not currently seeing a therapist.  He denies SI/HI.  GERD: He is not sure what triggers this.  He denies breakthrough on pantoprazole .  Upper GI from 04/2023 reviewed.  ED: Managed with tadalafil  as needed.  He follows with urology.  Insomnia: He has difficulty falling and staying asleep.  He is taking trazodone  as needed with some relief of symptoms but does feel like pain is interrupting his sleep.  Sleep study from 11/2016 reviewed.  Cervical radiculitis: Improved status post surgical intervention with intermittent paresthesia of upper extremities.  MRI cervical spine from 02/2022 reviewed. He is taking celecoxib , methocarbamol , tylenol  OTC with minimal relief of symptoms. He plans to have surgery soon. He no longer follows with neurosurgery.  Chronic right shoulder pain: Impoved status post surgical intervention. MRI from 02/2022 reviewed. He is taking  celecoxib , mthocarbamol and tylenol  OTC with minimal relief of symptoms.  He follows with orthopedics.  Chronic right hip pain: He does feel like this is worse lately.  He is taking celecoxib , ibuprofen  and tylenol  OTC with minimal relief of symptoms. Xray hip from 06/2023 reviewed. He is following with orthopedics for this.   History of prostate cancer: In remission.  Status post prostatectomy and radiation. He follows with oncology and urology.   Leukocytosis: His last WBC count was 3.4, 04/2024.  He follow with hematology.  Anemia: His/H/H was 14.1/42.2, 04/2024.  He is not taking any oral iron at this time.  He follows with hematology.  ADHD: Diagnosed 06/2024. He has not yet started medication for this, but is waiting results of the stress test. He is following with psychiatry.  Review of Systems     Past Medical History:  Diagnosis Date   Anxiety    Arthritis    knees and hips (02/28/2018)   Cervical radiculitis    Chicken pox    Depression    GERD (gastroesophageal reflux disease)    Heart murmur    Hernia, abdominal    High cholesterol    History of gout    History of hiatal hernia    Hyperlipidemia associated with type 2 diabetes mellitus (HCC)    Hypertension    Migraine    probably monthly (02/28/2018)   Motion sickness    ocean boats, circular motion   Neck pain    OSA on CPAP  Osteoarthritis    Prostate cancer (HCC) 2021   surgery and radiation treatments   Refusal of blood transfusions as patient is Jehovah's Witness    Seasonal allergies    Type II diabetes mellitus (HCC)     Current Outpatient Medications  Medication Sig Dispense Refill   Alcohol  Swabs (ALCOHOL  PREP) PADS 1 each by Does not apply route 2 (two) times daily. 200 each 3   amLODipine  (NORVASC ) 10 MG tablet Take 1 tablet (10 mg total) by mouth daily. 90 tablet 0   aspirin  325 MG tablet Take 325 mg by mouth daily.     atorvastatin  (LIPITOR ) 40 MG tablet Take 1 tablet (40 mg total) by mouth  daily. 90 tablet 1   Blood Glucose Monitoring Suppl (BLOOD GLUCOSE MONITOR SYSTEM) w/Device KIT Use as directed to check blood sugar once daily. 1 kit 0   buPROPion  (WELLBUTRIN  XL) 300 MG 24 hr tablet Take 1 tablet (300 mg total) by mouth daily. 90 tablet 0   celecoxib  (CELEBREX ) 200 MG capsule Take 1 capsule (200 mg total) by mouth every 12 (twelve) hours. 28 capsule 0   citalopram  (CELEXA ) 40 MG tablet Take 1 tablet (40 mg total) by mouth daily. 90 tablet 0   ezetimibe  (ZETIA ) 10 MG tablet Take 1 tablet (10 mg total) by mouth daily. 90 tablet 0   glipiZIDE  (GLUCOTROL  XL) 10 MG 24 hr tablet Take 1 tablet (10 mg total) by mouth daily with breakfast. 90 tablet 0   Glucose Blood (BLOOD GLUCOSE TEST STRIPS) STRP Use to check blood sugar once a day. 100 strip 1   Lancets (FREESTYLE) lancets Use as directed to check blood sugar once daily. 100 each 1   methocarbamol  (ROBAXIN ) 500 MG tablet Take 1 tablet (500 mg total) by mouth every 8 (eight) hours as needed for muscle spasms. 30 tablet 0   pantoprazole  (PROTONIX ) 40 MG tablet Take 1 tablet (40 mg total) by mouth daily. 90 tablet 1   tadalafil  (CIALIS ) 20 MG tablet Take 1 tablet (20 mg total) by mouth daily as needed for erectile dysfunction. 30 tablet 11   tirzepatide  (MOUNJARO ) 7.5 MG/0.5ML Pen Inject 7.5 mg into the skin once a week. 6 mL 1   traZODone  (DESYREL ) 100 MG tablet Take 2 tablets (200 mg total) by mouth at bedtime. 180 tablet 1   No current facility-administered medications for this visit.    Allergies  Allergen Reactions   Tape Rash    Paper tape-blisters    Family History  Problem Relation Age of Onset   Diabetes Mother    Hyperlipidemia Mother    Bladder Cancer Father    Liver cancer Sister    Diabetes Sister    Diabetes Maternal Aunt    Arthritis Maternal Grandmother    Diabetes Maternal Grandmother    Arthritis Maternal Grandfather    Arthritis Paternal Grandmother    Diabetes Brother    Heart disease Brother     Colon cancer Neg Hx     Social History   Socioeconomic History   Marital status: Married    Spouse name: Nena   Number of children: 2   Years of education: Not on file   Highest education level: Bachelor's degree (e.g., BA, AB, BS)  Occupational History   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  Tobacco Use   Smoking status: Former    Current packs/day: 0.00    Average packs/day: 1 pack/day for 27.0 years (27.0 ttl pk-yrs)    Types: Cigarettes  Start date: 10/18/1982    Quit date: 10/18/2009    Years since quitting: 14.9   Smokeless tobacco: Former    Types: Chew    Quit date: 10/18/1994  Vaping Use   Vaping status: Never Used  Substance and Sexual Activity   Alcohol  use: Yes    Alcohol /week: 21.0 standard drinks of alcohol     Types: 21 Cans of beer per week    Comment: 6 -8 beers daily   Drug use: Never   Sexual activity: Not on file  Other Topics Concern   Not on file  Social History Narrative   Not on file   Social Drivers of Health   Tobacco Use: Medium Risk (09/03/2024)   Patient History    Smoking Tobacco Use: Former    Smokeless Tobacco Use: Former    Passive Exposure: Not on Actuary Strain: Low Risk (04/19/2024)   Overall Financial Resource Strain (CARDIA)    Difficulty of Paying Living Expenses: Not very hard  Food Insecurity: No Food Insecurity (04/19/2024)   Epic    Worried About Programme Researcher, Broadcasting/film/video in the Last Year: Never true    Ran Out of Food in the Last Year: Never true  Transportation Needs: No Transportation Needs (04/19/2024)   Epic    Lack of Transportation (Medical): No    Lack of Transportation (Non-Medical): No  Physical Activity: Inactive (12/26/2023)   Exercise Vital Sign    Days of Exercise per Week: 0 days    Minutes of Exercise per Session: 150+ min  Stress: Stress Concern Present (04/19/2024)   [employer] of Occupational Health - Occupational Stress Questionnaire    Feeling of Stress: Rather much  Social Connections: Moderately  Isolated (12/26/2023)   Social Connection and Isolation Panel    Frequency of Communication with Friends and Family: Once a week    Frequency of Social Gatherings with Friends and Family: Never    Attends Religious Services: 1 to 4 times per year    Active Member of [employer] or Organizations: No    Attends Banker Meetings: Not on file    Marital Status: Married  Catering Manager Violence: Not At Risk (04/19/2024)   Epic    Fear of Current or Ex-Partner: No    Emotionally Abused: No    Physically Abused: No    Sexually Abused: No  Depression (PHQ2-9): Low Risk (08/27/2024)   Depression (PHQ2-9)    PHQ-2 Score: 2  Alcohol  Screen: Medium Risk (12/26/2023)   Alcohol  Screen    Last Alcohol  Screening Score (AUDIT): 13  Housing: Low Risk (04/19/2024)   Epic    Unable to Pay for Housing in the Last Year: No    Number of Times Moved in the Last Year: 0    Homeless in the Last Year: No  Utilities: Not At Risk (04/19/2024)   Epic    Threatened with loss of utilities: No  Health Literacy: Adequate Health Literacy (04/19/2024)   B1300 Health Literacy    Frequency of need for help with medical instructions: Never     Constitutional: Patient reports intermittent headaches.  Denies fever, malaise, fatigue, or abrupt weight changes.  HEENT: Denies eye pain, eye redness, ear pain, ringing in the ears, wax buildup, runny nose, nasal congestion, bloody nose, or sore throat. Respiratory: Denies difficulty breathing, shortness of breath, cough or sputum production.   Cardiovascular: Denies chest pain, chest tightness, palpitations or swelling in the hands or feet.  Gastrointestinal: Denies abdominal pain,  bloating, constipation, diarrhea or blood in the stool.  GU: Patient reports erectile dysfunction.  Denies urgency, frequency, pain with urination, burning sensation, blood in urine, odor or discharge. Musculoskeletal: Patient reports right hip pain.  Denies decrease in range of motion,  difficulty with gait, muscle pain or joint swelling.  Skin: Denies redness, rashes, lesions or ulcercations.  Neurological: Patient reports insomnia, paresthesias of upper extremities, and intention.  Denies dizziness, difficulty with memory, difficulty with speech or problems with balance and coordination.  Psych: Patient has a history of anxiety and depression.  Denies SI/HI.  No other specific complaints in a complete review of systems (except as listed in HPI above).  Objective:   Physical Exam BP 120/70 (BP Location: Right Arm, Patient Position: Sitting, Cuff Size: Normal)   Pulse 95   Ht 5' 11 (1.803 m)   Wt 219 lb 3.2 oz (99.4 kg)   SpO2 99%   BMI 30.57 kg/m      Wt Readings from Last 3 Encounters:  09/03/24 218 lb 2 oz (98.9 kg)  08/27/24 222 lb (100.7 kg)  07/03/24 217 lb (98.4 kg)    General: Appears his stated age, obese, in NAD. Skin: Warm, dry and intact. No ulcerations noted. HEENT: Head: normal shape and size; Eyes: sclera white, no icterus, conjunctiva pink, PERRLA and EOMs intact; Cardiovascular: Normal rate and rhythm. S1,S2 noted.  No murmur, rubs or gallops noted. No JVD or BLE edema. No carotid bruits noted. Pulmonary/Chest: Normal effort and positive vesicular breath sounds. No respiratory distress. No wheezes, rales or ronchi noted.  Abdomen: Soft and nontender. Normal bowel sounds.  Musculoskeletal: Decreased flexion of the cervical spine.  Normal extension and rotation of the cervical spine.  Decreased external rotation of the right shoulder.  Normal internal rotation of the right shoulder.  Normal abduction, adduction of the right hip.  Significantly decreased internal rotation of the right hip.  He also has decreased external rotation of the right hip.  Strength 5/5 BLE.  Gait steady without device. Neurological: Alert and oriented. Cranial nerves II-XII grossly intact. Coordination normal.  Psychiatric: Mood and affect normal. Behavior is normal.  Judgment and thought content normal.     BMET    Component Value Date/Time   NA 140 04/09/2024 1007   K 4.4 04/09/2024 1007   CL 106 04/09/2024 1007   CO2 25 04/09/2024 1007   GLUCOSE 140 (H) 04/09/2024 1007   BUN 19 04/09/2024 1007   CREATININE 0.75 04/09/2024 1007   CALCIUM  9.4 04/09/2024 1007   GFRNONAA >60 09/27/2023 0949   GFRAA >60 11/20/2019 0445    Lipid Panel     Component Value Date/Time   CHOL 178 04/09/2024 1007   CHOL 157 10/26/2019 1203   TRIG 319 (H) 04/09/2024 1007   HDL 50 04/09/2024 1007   HDL 48 10/26/2019 1203   CHOLHDL 3.6 04/09/2024 1007   VLDL 60.2 (H) 01/01/2021 1047   LDLCALC 90 04/09/2024 1007    CBC    Component Value Date/Time   WBC 3.4 (L) 04/19/2024 1154   RBC 5.28 04/19/2024 1154   HGB 14.1 04/19/2024 1154   HGB 11.7 (L) 10/03/2023 0812   HCT 42.2 04/19/2024 1154   PLT 266 04/19/2024 1154   PLT 226 10/03/2023 0812   MCV 79.9 (L) 04/19/2024 1154   MCH 26.7 04/19/2024 1154   MCHC 33.4 04/19/2024 1154   RDW 14.6 04/19/2024 1154   LYMPHSABS 0.8 04/19/2024 1154   MONOABS 0.4 04/19/2024 1154  EOSABS 0.1 04/19/2024 1154   BASOSABS 0.0 04/19/2024 1154    Hgb A1C Lab Results  Component Value Date   HGBA1C 5.7 (H) 04/09/2024           Assessment & Plan:     RTC in 6 months for your annual exam Angeline Laura, NP  "

## 2024-10-08 ENCOUNTER — Ambulatory Visit: Admitting: Internal Medicine

## 2024-10-08 ENCOUNTER — Other Ambulatory Visit: Payer: Self-pay

## 2024-10-08 VITALS — BP 120/70 | HR 95 | Ht 71.0 in | Wt 219.2 lb

## 2024-10-08 DIAGNOSIS — E1165 Type 2 diabetes mellitus with hyperglycemia: Secondary | ICD-10-CM | POA: Diagnosis not present

## 2024-10-08 DIAGNOSIS — F411 Generalized anxiety disorder: Secondary | ICD-10-CM | POA: Diagnosis not present

## 2024-10-08 DIAGNOSIS — Z7984 Long term (current) use of oral hypoglycemic drugs: Secondary | ICD-10-CM

## 2024-10-08 DIAGNOSIS — M150 Primary generalized (osteo)arthritis: Secondary | ICD-10-CM | POA: Diagnosis not present

## 2024-10-08 DIAGNOSIS — I152 Hypertension secondary to endocrine disorders: Secondary | ICD-10-CM | POA: Diagnosis not present

## 2024-10-08 DIAGNOSIS — D72829 Elevated white blood cell count, unspecified: Secondary | ICD-10-CM

## 2024-10-08 DIAGNOSIS — Z8546 Personal history of malignant neoplasm of prostate: Secondary | ICD-10-CM

## 2024-10-08 DIAGNOSIS — N522 Drug-induced erectile dysfunction: Secondary | ICD-10-CM

## 2024-10-08 DIAGNOSIS — G43C1 Periodic headache syndromes in child or adult, intractable: Secondary | ICD-10-CM | POA: Diagnosis not present

## 2024-10-08 DIAGNOSIS — F331 Major depressive disorder, recurrent, moderate: Secondary | ICD-10-CM | POA: Insufficient documentation

## 2024-10-08 DIAGNOSIS — E1159 Type 2 diabetes mellitus with other circulatory complications: Secondary | ICD-10-CM

## 2024-10-08 DIAGNOSIS — K219 Gastro-esophageal reflux disease without esophagitis: Secondary | ICD-10-CM

## 2024-10-08 DIAGNOSIS — E1169 Type 2 diabetes mellitus with other specified complication: Secondary | ICD-10-CM

## 2024-10-08 DIAGNOSIS — G4733 Obstructive sleep apnea (adult) (pediatric): Secondary | ICD-10-CM | POA: Diagnosis not present

## 2024-10-08 DIAGNOSIS — D500 Iron deficiency anemia secondary to blood loss (chronic): Secondary | ICD-10-CM

## 2024-10-08 DIAGNOSIS — M5412 Radiculopathy, cervical region: Secondary | ICD-10-CM

## 2024-10-08 DIAGNOSIS — E785 Hyperlipidemia, unspecified: Secondary | ICD-10-CM

## 2024-10-08 DIAGNOSIS — Z7985 Long-term (current) use of injectable non-insulin antidiabetic drugs: Secondary | ICD-10-CM

## 2024-10-08 DIAGNOSIS — G8929 Other chronic pain: Secondary | ICD-10-CM | POA: Insufficient documentation

## 2024-10-08 DIAGNOSIS — G4701 Insomnia due to medical condition: Secondary | ICD-10-CM

## 2024-10-08 DIAGNOSIS — E66811 Obesity, class 1: Secondary | ICD-10-CM

## 2024-10-08 LAB — COMPREHENSIVE METABOLIC PANEL WITH GFR
AG Ratio: 1.6 (calc) (ref 1.0–2.5)
ALT: 21 U/L (ref 9–46)
AST: 24 U/L (ref 10–35)
Albumin: 4.3 g/dL (ref 3.6–5.1)
Alkaline phosphatase (APISO): 90 U/L (ref 35–144)
BUN: 18 mg/dL (ref 7–25)
CO2: 27 mmol/L (ref 20–32)
Calcium: 9.8 mg/dL (ref 8.6–10.3)
Chloride: 104 mmol/L (ref 98–110)
Creat: 1.02 mg/dL (ref 0.70–1.35)
Globulin: 2.7 g/dL (ref 1.9–3.7)
Glucose, Bld: 141 mg/dL — ABNORMAL HIGH (ref 65–99)
Potassium: 3.8 mmol/L (ref 3.5–5.3)
Sodium: 138 mmol/L (ref 135–146)
Total Bilirubin: 0.5 mg/dL (ref 0.2–1.2)
Total Protein: 7 g/dL (ref 6.1–8.1)
eGFR: 84 mL/min/1.73m2

## 2024-10-08 LAB — HEMOGLOBIN A1C
Hgb A1c MFr Bld: 5.1 %
Mean Plasma Glucose: 100 mg/dL
eAG (mmol/L): 5.5 mmol/L

## 2024-10-08 LAB — CBC
HCT: 40.6 % (ref 39.4–51.1)
Hemoglobin: 13.1 g/dL — ABNORMAL LOW (ref 13.2–17.1)
MCH: 26.6 pg — ABNORMAL LOW (ref 27.0–33.0)
MCHC: 32.3 g/dL (ref 31.6–35.4)
MCV: 82.5 fL (ref 81.4–101.7)
MPV: 9.6 fL (ref 7.5–12.5)
Platelets: 232 Thousand/uL (ref 140–400)
RBC: 4.92 Million/uL (ref 4.20–5.80)
RDW: 14.6 % (ref 11.0–15.0)
WBC: 3.7 Thousand/uL — ABNORMAL LOW (ref 3.8–10.8)

## 2024-10-08 LAB — LIPID PANEL
Cholesterol: 149 mg/dL
HDL: 51 mg/dL
LDL Cholesterol (Calc): 72 mg/dL
Non-HDL Cholesterol (Calc): 98 mg/dL
Total CHOL/HDL Ratio: 2.9 (calc)
Triglycerides: 181 mg/dL — ABNORMAL HIGH

## 2024-10-08 MED ORDER — PANTOPRAZOLE SODIUM 20 MG PO TBEC
20.0000 mg | DELAYED_RELEASE_TABLET | Freq: Every day | ORAL | 1 refills | Status: AC
  Filled 2024-10-08 – 2024-10-29 (×3): qty 90, 90d supply, fill #0

## 2024-10-08 NOTE — Assessment & Plan Note (Signed)
 Will obtain MRI right hip Continue celecoxib  200 mg daily, methocarbamol  500 mg every 8 hours and Tylenol  OTC Consider referral to orthopedics for further evaluation

## 2024-10-08 NOTE — Assessment & Plan Note (Signed)
 Continue trazodone  200 mg nightly as needed Will monitor

## 2024-10-08 NOTE — Assessment & Plan Note (Addendum)
 CBC today Iron panel reviewed He will continue to follow with hematology

## 2024-10-08 NOTE — Assessment & Plan Note (Signed)
 Complicated by obesity A1c today Urine microalbumin has been checked within the last year Encouraged low carb diet and exercise for weight loss Continue glipizide  10 mg daily and tirzepatide  7.5 mg weekly Encouraged routine eye exam, request copy Encouraged routine foot exam Flu shot  Pneumovax and Prevnar UTD

## 2024-10-08 NOTE — Assessment & Plan Note (Signed)
 Continue tadalafil  20 mg as needed Will monitor

## 2024-10-08 NOTE — Assessment & Plan Note (Addendum)
CBC today He will continue to follow with hematology

## 2024-10-08 NOTE — Assessment & Plan Note (Signed)
 Completed by obesity and diabetes Controlled on amlodipine  10 mg daily Reinforced DASH diet and exercise for weight loss CMET today

## 2024-10-08 NOTE — Assessment & Plan Note (Addendum)
 Try to avoid triggers Continue tylenol  OTC as needed

## 2024-10-08 NOTE — Assessment & Plan Note (Signed)
 Complicated by obesity and diabetes C-Met and lipid profile today Encourage him to consume a low-fat diet Continue atorvastatin  40 mg and ezetimibe  10 mg daily

## 2024-10-08 NOTE — Patient Instructions (Signed)

## 2024-10-08 NOTE — Assessment & Plan Note (Signed)
 In remission Following with urology and oncology

## 2024-10-08 NOTE — Assessment & Plan Note (Signed)
 Stable on citalopram  40 mg and bupropion  300 mg daily Support offered

## 2024-10-08 NOTE — Assessment & Plan Note (Addendum)
 Continue methocarbamol  500 mg every 8 hours as needed, acetaminophen  OTC Encouraged regular stretching

## 2024-10-08 NOTE — Assessment & Plan Note (Addendum)
 Complicated by obesity Encourage weight loss as this can help reduce sleep apnea symptoms Encouraged CPAP compliance

## 2024-10-08 NOTE — Assessment & Plan Note (Signed)
 Encouraged diet and exercise for weight loss ?

## 2024-10-08 NOTE — Assessment & Plan Note (Signed)
 Complicated by obesity Try to identify and avoid triggers Encourage weight loss as this can help reduce reflux symptoms Will decrease pantoprazole  to 20 mg daily

## 2024-10-08 NOTE — Assessment & Plan Note (Signed)
 Complicated by obesity Continue celecoxib  200 mg twice daily, methocarbamol  500 mg every 8 hours as needed Encouraged weight loss as this can help reduce joint pain

## 2024-10-08 NOTE — Assessment & Plan Note (Signed)
 Continue citalopram  40 mg and bupropion  300 mg daily Support offered

## 2024-10-09 ENCOUNTER — Ambulatory Visit: Payer: Self-pay | Admitting: Internal Medicine

## 2024-10-09 NOTE — Addendum Note (Signed)
 Addended by: ANTONETTE ANGELINE ORN on: 10/09/2024 07:47 AM   Modules accepted: Orders

## 2024-10-17 ENCOUNTER — Other Ambulatory Visit (HOSPITAL_COMMUNITY): Payer: Self-pay

## 2024-10-23 ENCOUNTER — Other Ambulatory Visit: Payer: Self-pay

## 2024-10-25 ENCOUNTER — Other Ambulatory Visit (HOSPITAL_COMMUNITY): Payer: Self-pay

## 2024-10-25 ENCOUNTER — Telehealth: Payer: Self-pay

## 2024-10-25 NOTE — Telephone Encounter (Signed)
 Pharmacy Patient Advocate Encounter   Received notification from [facility] KEY that prior authorization for Mounjaro  7.5 is required/requested.   Insurance verification completed.   The patient is insured through [facility].   Per test claim: The current 28 day co-pay is, $25.00.  No PA needed at this time. This test claim was processed through [facility]- copay amounts may vary at other pharmacies due to pharmacy/plan contracts, or as the patient moves through the different stages of their insurance plan.    Patient PA expires 04/10/25.

## 2024-10-26 ENCOUNTER — Ambulatory Visit
Admission: RE | Admit: 2024-10-26 | Discharge: 2024-10-26 | Disposition: A | Source: Ambulatory Visit | Attending: Internal Medicine | Admitting: Internal Medicine

## 2024-10-26 DIAGNOSIS — G8929 Other chronic pain: Secondary | ICD-10-CM | POA: Diagnosis present

## 2024-10-26 DIAGNOSIS — M25551 Pain in right hip: Secondary | ICD-10-CM | POA: Insufficient documentation

## 2024-10-28 ENCOUNTER — Other Ambulatory Visit: Payer: Self-pay | Admitting: Internal Medicine

## 2024-10-28 ENCOUNTER — Other Ambulatory Visit: Payer: Self-pay | Admitting: Physician Assistant

## 2024-10-29 ENCOUNTER — Other Ambulatory Visit: Payer: Self-pay

## 2024-10-29 ENCOUNTER — Other Ambulatory Visit: Payer: Self-pay | Admitting: Internal Medicine

## 2024-10-29 MED ORDER — METHOCARBAMOL 500 MG PO TABS
500.0000 mg | ORAL_TABLET | Freq: Three times a day (TID) | ORAL | 0 refills | Status: AC | PRN
  Filled 2024-10-29: qty 30, 10d supply, fill #0

## 2024-10-30 ENCOUNTER — Other Ambulatory Visit: Payer: Self-pay

## 2024-10-30 MED FILL — Citalopram Hydrobromide Tab 40 MG (Base Equiv): ORAL | 90 days supply | Qty: 90 | Fill #0 | Status: AC

## 2024-10-30 NOTE — Telephone Encounter (Signed)
 Requested medication (s) are due for refill today - yes  Requested medication (s) are on the active medication list -yes  Future visit scheduled -no  Last refill: 04/10/24 6ml 1RF  Notes to clinic: off protocol- provider review   Requested Prescriptions  Pending Prescriptions Disp Refills   MOUNJARO  7.5 MG/0.5ML Pen [Pharmacy Med Name: tirzepatide  (MOUNJARO ) 7.5 MG/0.5ML Pen] 6 mL 1    Sig: Inject 7.5 mg into the skin once a week.     Off-Protocol Failed - 10/30/2024  3:16 PM      Failed - Medication not assigned to a protocol, review manually.      Passed - Valid encounter within last 12 months    Recent Outpatient Visits           3 weeks ago Hypertension associated with diabetes [facility])   [place] [place], Angeline ORN, NP   6 months ago Encounter for general adult medical examination with abnormal findings   [place] [facility] [place], [place], NP   10 months ago Type 2 diabetes mellitus with hyperglycemia, without long-term current use of insulin  [facility])   [place] [facility] [place], Angeline ORN, NP   11 months ago Cervical radiculitis   [place] [facility] [place], Angeline ORN, NP       Future Appointments             In 1 month Georganne Penne SAUNDERS, MD [facility] [place]            Signed Prescriptions Disp Refills   citalopram  (CELEXA ) 40 MG tablet 90 tablet 0    Sig: Take 1 tablet (40 mg total) by mouth daily.     Psychiatry:  Antidepressants - SSRI Passed - 10/30/2024  3:16 PM      Passed - Completed PHQ-2 or PHQ-9 in the last 360 days      Passed - Valid encounter within last 6 months    Recent Outpatient Visits           3 weeks ago Hypertension associated with diabetes [facility])   [place] [facility] [place], Angeline ORN, NP   6 months ago Encounter for general adult medical examination with abnormal findings   [place] [facility] [place],  [place], NP   10 months ago Type 2 diabetes mellitus with hyperglycemia, without long-term current use of insulin  [facility])   [place] [facility] [place], Angeline ORN, NP   11 months ago Cervical radiculitis   [place] [facility] [place], Angeline ORN, NP       Future Appointments             In 1 month Ge[place]ne, Penne SAUNDERS, MD [facility] [place]               Requested Prescriptions  Pending Prescriptions Disp Refills   MOUNJARO  7.5 MG/0.5ML Pen [Pharmacy Med Name: tirzepatide  (MOUNJARO ) 7.5 MG/0.5ML Pen] 6 mL 1    Sig: Inject 7.5 mg into the skin once a week.     Off-Protocol Failed - 10/30/2024  3:16 PM      Failed - Medication not assigned to a protocol, review manually.      Passed - Valid encounter within last 12 months    Recent Outpatient Visits           3 weeks ago Hypertension associated with diabetes (HCC)  [place] [facility] [place], Angeline ORN, NP   6 months ago Encounter for general adult medical examination with abnormal findings   [place] [facility] [place], [place], NP   10 months ago Type 2 diabetes mellitus with hyperglycemia, without long-term current use of insulin  [facility])   [place] [facility] [place], Angeline ORN, NP   11 months ago Cervical radiculitis   [place] [facility] [place], Angeline ORN, NP       Future Appointments             In 1 month Georganne Penne SAUNDERS, MD [facility] [place]            Signed Prescriptions Disp Refills   citalopram  (CELEXA ) 40 MG tablet 90 tablet 0    Sig: Take 1 tablet (40 mg total) by mouth daily.     Psychiatry:  Antidepressants - SSRI Passed - 10/30/2024  3:16 PM      Passed - Completed PHQ-2 or PHQ-9 in the last 360 days      Passed - Valid encounter within last 6 months    Recent Outpatient Visits           3 weeks ago Hypertension associated with diabetes [facility])   [place] [facility] [place], Angeline ORN, NP   6 months ago Encounter for general adult medical examination with abnormal findings   [place] [facility] [place], Angeline ORN, NP   10 months ago Type 2 diabetes mellitus with hyperglycemia, without long-term current use of insulin  [facility])   [place] [facility] [place], Angeline ORN, NP   11 months ago Cervical radiculitis    [facility] [place], Angeline ORN, NP       Future Appointments             In 1 month Ge[place], Penne SAUNDERS, MD [facility]

## 2024-10-30 NOTE — Telephone Encounter (Signed)
 Requested Prescriptions  Pending Prescriptions Disp Refills   citalopram  (CELEXA ) 40 MG tablet 90 tablet 0    Sig: Take 1 tablet (40 mg total) by mouth daily.     Psychiatry:  Antidepressants - SSRI Passed - 10/30/2024  3:15 PM      Passed - Completed PHQ-2 or PHQ-9 in the last 360 days      Passed - Valid encounter within last 6 months    Recent Outpatient Visits           3 weeks ago Hypertension associated with diabetes [facility])   [place] [facility] [place], Angeline ORN, NP   6 months ago Encounter for general adult medical examination with abnormal findings   [place] [facility] [place], [place] W, NP   10 months ago Type 2 diabetes mellitus with hyperglycemia, without long-term current use of insulin  [facility])   [place] [facility] [place], Angeline ORN, NP   11 months ago Cervical radiculitis   [place] [facility] [place], Angeline ORN, NP       Future Appointments             In 1 month Marc Marc SAUNDERS, MD [facility] [place]             MOUNJARO  7.5 MG/0.5ML Pen [Pharmacy Med Name: tirzepatide  (MOUNJARO ) 7.5 MG/0.5ML Pen] 6 mL 1    Sig: Inject 7.5 mg into the skin once a week.     Off-Protocol Failed - 10/30/2024  3:15 PM      Failed - Medication not assigned to a protocol, review manually.      Passed - Valid encounter within last 12 months    Recent Outpatient Visits           3 weeks ago Hypertension associated with diabetes [facility])   [place] [facility] [place], Angeline ORN, NP   6 months ago Encounter for general adult medical examination with abnormal findings   [place] [facility] [place], [place], NP   10 months ago Type 2 diabetes mellitus with hyperglycemia, without long-term current use of insulin  [facility])   [place] [facility] [place], Angeline ORN, NP   11 months ago Cervical radiculitis    [facility] [place],  Angeline ORN, NP       Future Appointments             In 1 month Marc, Marc SAUNDERS, MD [facility]

## 2024-11-03 ENCOUNTER — Other Ambulatory Visit: Payer: Self-pay

## 2024-11-06 ENCOUNTER — Other Ambulatory Visit: Payer: Self-pay

## 2024-11-06 ENCOUNTER — Ambulatory Visit (HOSPITAL_COMMUNITY)
Admission: EM | Admit: 2024-11-06 | Discharge: 2024-11-06 | Disposition: A | Discharge status: Home / Self Care | Attending: Family Medicine | Admitting: Family Medicine

## 2024-11-06 ENCOUNTER — Encounter (HOSPITAL_COMMUNITY): Payer: Self-pay | Admitting: *Deleted

## 2024-11-06 DIAGNOSIS — R062 Wheezing: Secondary | ICD-10-CM | POA: Diagnosis not present

## 2024-11-06 DIAGNOSIS — R051 Acute cough: Secondary | ICD-10-CM

## 2024-11-06 DIAGNOSIS — J111 Influenza due to unidentified influenza virus with other respiratory manifestations: Secondary | ICD-10-CM

## 2024-11-06 MED ORDER — PREDNISONE 20 MG PO TABS: 40.0000 mg | ORAL_TABLET | Freq: Every day | ORAL | 0 refills | Status: DC

## 2024-11-06 MED ORDER — HYDROCODONE BIT-HOMATROP MBR 5-1.5 MG/5ML PO SOLN: 5.0000 mL | Freq: Four times a day (QID) | ORAL | 0 refills | Status: AC | PRN

## 2024-11-06 NOTE — ED Triage Notes (Signed)
 PT reports he has been sick since Friday. Pt has a cough ,congestion. Pt has had temp 99.3  Pt took tylenol  and was sent home from work.

## 2024-11-06 NOTE — Discharge Instructions (Signed)
 For your nasal congestion, you may use over the counter AFRIN nasal spray. This medication is for use in the nose. Take it as directed on the label. Shake well before using. Do not use it more often than directed. Do not use for more than 3 days in a row without talking to your care team first. Make sure that you are using your nasal spray correctly.

## 2024-11-07 ENCOUNTER — Other Ambulatory Visit: Payer: Self-pay

## 2024-11-07 ENCOUNTER — Other Ambulatory Visit: Payer: Self-pay | Admitting: Internal Medicine

## 2024-11-07 NOTE — ED Provider Notes (Signed)
 " [facility]   [id number] 11/06/24 Arrival Time: 1913  ASSESSMENT & PLAN:  1. Influenza-like illness   2. Acute cough   3. Wheezing    Day #4 of illness. Flu-like. No distress.  Meds ordered this encounter  Medications   HYDROcodone  bit-homatropine (HYCODAN) 5-1.5 MG/5ML syrup    Sig: Take 5 mLs by mouth every 6 (six) hours as needed for cough.    Dispense:  90 mL    Refill:  0   predniSONE  (DELTASONE ) 20 MG tablet    Sig: Take 2 tablets (40 mg total) by mouth daily.    Dispense:  10 tablet    Refill:  0     Discharge Instructions      For your nasal congestion, you may use over the counter AFRIN nasal spray. This medication is for use in the nose. Take it as directed on the label. Shake well before using. Do not use it more often than directed. Do not use for more than 3 days in a row without talking to your care team first. Make sure that you are using your nasal spray correctly.        Follow-up Information     [facility].   Specialty: Urgent Care Why: If worsening or failing to improve as anticipated. Contact information: [address] [place]   [id number] [phone]               Declines Tylenol  here. Will take at home and hydrate and recheck P. Denies CP.  Reviewed expectations re: course of current medical issues. Questions answered. Outlined signs and symptoms indicating need for more acute intervention. Understanding verbalized. After Visit Summary given.   SUBJECTIVE: History from: Patient. Marc Aguilar is a 64 y.o. male. PT reports he has been sick since Friday. Pt has a cough ,congestion. Pt has had temp 99.3  Pt took tylenol  and was sent home from work. Denies SOB. Ques wheezing. Normal PO intake without n/v/d.  OBJECTIVE:  Vitals:   11/06/24 2012  BP: 112/70  Pulse: (!) 122  Resp: 20  Temp: 99.7 F (37.6 C)  SpO2: 93%    Recheck P 107-110. General appearance:  alert; no distress Eyes: PERRLA; EOMI; conjunctiva normal HENT: [place]; AT; with nasal congestion Neck: supple  Lungs: speaks full sentences without difficulty; unlabored Extremities: no edema Skin: warm and dry Neurologic: normal gait Psychological: alert and cooperative; normal mood and affect  Labs:  Labs Reviewed - No data to display  Imaging: No results found.  Allergies[1]  Past Medical History:  Diagnosis Date   Anxiety    Arthritis    knees and hips (02/28/2018)   Cervical radiculitis    Chicken pox    Depression    GERD (gastroesophageal reflux disease)    Heart murmur    Hernia, abdominal    High cholesterol    History of gout    History of hiatal hernia    Hyperlipidemia associated with type 2 diabetes mellitus (HCC)    Hypertension    Migraine    probably monthly (02/28/2018)   Motion sickness    ocean boats, circular motion   Neck pain    OSA on CPAP    Osteoarthritis    Prostate cancer (HCC) 2021   surgery and radiation treatments   Refusal of blood transfusions as patient is Jehovah's Witness    Seasonal allergies    Type II diabetes mellitus (HCC)    Social  History   Socioeconomic History   Marital status: Married    Spouse name: Nena   Number of children: 2   Years of education: Not on file   Highest education level: Bachelor's degree (e.g., BA, AB, BS)  Occupational History   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  Tobacco Use   Smoking status: Former    Current packs/day: 0.00    Average packs/day: 1 pack/day for 27.0 years (27.0 ttl pk-yrs)    Types: Cigarettes    Start date: 10/18/1982    Quit date: 10/18/2009    Years since quitting: 15.0   Smokeless tobacco: Former    Types: Chew    Quit date: 10/18/1994  Vaping Use   Vaping status: Never Used  Substance and Sexual Activity   Alcohol  use: Yes    Alcohol /week: 21.0 standard drinks of alcohol     Types: 21 Cans of beer per week    Comment: 6 -8 beers daily   Drug use: Never   Sexual activity:  Not on file  Other Topics Concern   Not on file  Social History Narrative   Not on file   Social Drivers of Health   Tobacco Use: Medium Risk (11/06/2024)   Patient History    Smoking Tobacco Use: Former    Smokeless Tobacco Use: Former    Passive Exposure: Not on Actuary Strain: Low Risk (10/08/2024)   Overall Financial Resource Strain (CARDIA)    Difficulty of Paying Living Expenses: Not hard at all  Food Insecurity: No Food Insecurity (10/08/2024)   Epic    Worried About Radiation Protection Practitioner of Food in the Last Year: Never true    Ran Out of Food in the Last Year: Never true  Transportation Needs: No Transportation Needs (10/08/2024)   Epic    Lack of Transportation (Medical): No    Lack of Transportation (Non-Medical): No  Physical Activity: Insufficiently Active (10/08/2024)   Exercise Vital Sign    Days of Exercise per Week: 3 days    Minutes of Exercise per Session: 10 min  Stress: Stress Concern Present (10/08/2024)   [employer] of Occupational Health - Occupational Stress Questionnaire    Feeling of Stress: To some extent  Social Connections: Moderately Isolated (10/08/2024)   Social Connection and Isolation Panel    Frequency of Communication with Friends and Family: Once a week    Frequency of Social Gatherings with Friends and Family: Never    Attends Religious Services: 1 to 4 times per year    Active Member of [employer] or Organizations: No    Attends Banker Meetings: Not on file    Marital Status: Married  Catering Manager Violence: Not At Risk (04/19/2024)   Epic    Fear of Current or Ex-Partner: No    Emotionally Abused: No    Physically Abused: No    Sexually Abused: No  Depression (PHQ2-9): High Risk (10/08/2024)   Depression (PHQ2-9)    PHQ-2 Score: 13  Alcohol  Screen: High Risk (10/08/2024)   Alcohol  Screen    Last Alcohol  Screening Score (AUDIT): 16  Housing: High Risk (10/08/2024)   Epic    Unable to Pay for Housing in  the Last Year: Yes    Number of Times Moved in the Last Year: 0    Homeless in the Last Year: No  Utilities: Not At Risk (04/19/2024)   Epic    Threatened with loss of utilities: No  Health Literacy: Adequate Health Literacy (04/19/2024)  B1300 Health Literacy    Frequency of need for help with medical instructions: Never   Family History  Problem Relation Age of Onset   Diabetes Mother    Hyperlipidemia Mother    Bladder Cancer Father    Liver cancer Sister    Diabetes Sister    Diabetes Maternal Aunt    Arthritis Maternal Grandmother    Diabetes Maternal Grandmother    Arthritis Maternal Grandfather    Arthritis Paternal Grandmother    Diabetes Brother    Heart disease Brother    Colon cancer Neg Hx    Past Surgical History:  Procedure Laterality Date   CARDIAC CATHETERIZATION  02/28/2018   ESOPHAGOGASTRODUODENOSCOPY (EGD) WITH PROPOFOL  N/A 03/17/2023   Procedure: ESOPHAGOGASTRODUODENOSCOPY (EGD) WITH PROPOFOL ;  Surgeon: Jinny Carmine, MD;  Location[facility];  Service: Endoscopy;  Laterality: N/A;  Diabetic   ESOPHAGOGASTRODUODENOSCOPY (EGD) WITH PROPOFOL  N/A 04/18/2023   Procedure: ESOPHAGOGASTRODUODENOSCOPY (EGD) WITH PROPOFOL ;  Surgeon: Jinny Carmine, MD;  Location: [facility];  Service: Endoscopy;  Laterality: N/A;   LEFT HEART CATH AND CORONARY ANGIOGRAPHY N/A 02/28/2018   Procedure: LEFT HEART CATH AND CORONARY ANGIOGRAPHY;  Surgeon: Wonda Sharper, MD;  Location: [facility] INVASIVE CV LAB;  Service: Cardiovascular;  Laterality: N/A;   ROBOT ASSISTED LAPAROSCOPIC RADICAL PROSTATECTOMY N/A 11/19/2019   Procedure: XI ROBOTIC ASSISTED LAPAROSCOPIC RADICAL PROSTATECTOMY WITH LYMPH NODE DISSECTION;  Surgeon: Penne Knee, MD;  Location: [facility] ORS;  Service: Urology;  Laterality: N/A;   SHOULDER ARTHROSCOPY WITH ROTATOR CUFF REPAIR AND SUBACROMIAL DECOMPRESSION Right 05/21/2022   Procedure: Right shoulder arthroscopic rotator cuff repair (supraspinatus &  subscapularis), subacromial decompression, and biceps tenodesis;  Surgeon: Tobie Priest, MD;  Location: [facility] ORS;  Service: Orthopedics;  Laterality: Right;   VASECTOMY     WRIST GANGLION EXCISION Left 1984      [1]  Allergies Allergen Reactions   Tape Rash    Paper tape-blisters     Rolinda Rogue, MD 11/07/24 0825  "

## 2024-11-07 NOTE — Telephone Encounter (Signed)
 Requested medication (s) are due for refill today - yes  Requested medication (s) are on the active medication list -yes  Future visit scheduled -no  Last refill: 01/09/24 6ml 1RF  Notes to clinic: off protocol- provider review   Requested Prescriptions  Pending Prescriptions Disp Refills   tirzepatide  (MOUNJARO ) 7.5 MG/0.5ML Pen 6 mL 1    Sig: Inject 7.5 mg into the skin once a week.     Off-Protocol Failed - 11/07/2024  4:13 PM      Failed - Medication not assigned to a protocol, review manually.      Passed - Valid encounter within last 12 months    Recent Outpatient Visits           1 month ago Hypertension associated with diabetes [facility])   [place] [facility] [place], Angeline ORN, NP   7 months ago Encounter for general adult medical examination with abnormal findings   [place] [facility] [place], [place], NP   10 months ago Type 2 diabetes mellitus with hyperglycemia, without long-term current use of insulin  [facility])   [place] [facility] [place], Angeline ORN, NP   11 months ago Cervical radiculitis   [place] [facility] [place], Angeline ORN, NP       Future Appointments             In 1 month Marc Penne SAUNDERS, MD [facility] [place]               Requested Prescriptions  Pending Prescriptions Disp Refills   tirzepatide  (MOUNJARO ) 7.5 MG/0.5ML Pen 6 mL 1    Sig: Inject 7.5 mg into the skin once a week.     Off-Protocol Failed - 11/07/2024  4:13 PM      Failed - Medication not assigned to a protocol, review manually.      Passed - Valid encounter within last 12 months    Recent Outpatient Visits           1 month ago Hypertension associated with diabetes [facility])   [facility] [place], Angeline ORN, NP   7 months ago Encounter for general adult medical examination with abnormal findings   [place] [facility] [place], [place], NP   10 months  ago Type 2 diabetes mellitus with hyperglycemia, without long-term current use of insulin  [facility])   [place] [facility] [place], Angeline ORN, NP   11 months ago Cervical radiculitis   [place] [facility] [place], Angeline ORN, NP       Future Appointments             In 1 month Marc, Penne SAUNDERS, MD [facility]

## 2024-11-08 ENCOUNTER — Other Ambulatory Visit: Payer: Self-pay | Admitting: Internal Medicine

## 2024-11-09 ENCOUNTER — Other Ambulatory Visit: Payer: Self-pay

## 2024-11-09 MED ORDER — TIRZEPATIDE 7.5 MG/0.5ML ~~LOC~~ SOAJ
7.5000 mg | SUBCUTANEOUS | 1 refills | Status: AC
  Filled 2024-11-09: qty 6, 84d supply, fill #0

## 2024-11-09 NOTE — Addendum Note (Signed)
 Addended by: ZELIA GAUZE D on: 11/09/2024 07:56 AM   Modules accepted: Orders

## 2024-11-13 ENCOUNTER — Encounter: Payer: Self-pay | Admitting: Internal Medicine

## 2024-11-22 NOTE — Progress Notes (Unsigned)
 "  Subjective:    Patient ID: Marc Aguilar, male    DOB: May 28, 1961, 64 y.o.   MRN: [id number]  HPI  Discussed the use of AI scribe software for clinical note transcription with the patient, who gave verbal consent to proceed.  Marc Aguilar is a 64 year old male who presents with persistent upper respiratory symptoms.  He has been experiencing upper respiratory symptoms for approximately four days prior to his urgent care visit on January 20th, where he was diagnosed with an influenza-like illness, cough, and wheezing. He was prescribed prednisone  and a cough syrup, and advised to take Afrin over-the-counter.  Since the urgent care visit, his symptoms have been intermittent, with a recurring headache that differs from his usual headaches. The headache persists despite taking Tylenol  and improves with rest. He also has a runny nose worse when bending over. He reports he feels like his nose is dry because when he blows it, sometimes it bleeds. Constant postnasal drip accumulates in his throat, causing nausea and affecting his appetite.  His cough has improved but remains present. No shortness of breath is noted. He has been taking ibuprofen , Advil , and Aleve  as needed. He does not take a daily antihistamine and reports that Afrin nasal spray caused a burning sensation in his nose, exacerbating his headache. He has been using humidifiers at home.  He will need FMLA form completion due to his illness from January 16 to 23.   Review of Systems     Past Medical History:  Diagnosis Date   Anxiety    Arthritis    knees and hips (02/28/2018)   Cervical radiculitis    Chicken pox    Depression    GERD (gastroesophageal reflux disease)    Heart murmur    Hernia, abdominal    High cholesterol    History of gout    History of hiatal hernia    Hyperlipidemia associated with type 2 diabetes mellitus (HCC)    Hypertension    Migraine    probably monthly (02/28/2018)   Motion sickness     ocean boats, circular motion   Neck pain    OSA on CPAP    Osteoarthritis    Prostate cancer (HCC) 2021   surgery and radiation treatments   Refusal of blood transfusions as patient is Jehovah's Witness    Seasonal allergies    Type II diabetes mellitus (HCC)     Current Outpatient Medications  Medication Sig Dispense Refill   amLODipine  (NORVASC ) 10 MG tablet Take 1 tablet (10 mg total) by mouth daily. 90 tablet 0   aspirin  325 MG tablet Take 325 mg by mouth daily.     atorvastatin  (LIPITOR ) 40 MG tablet Take 1 tablet (40 mg total) by mouth daily. 90 tablet 1   Blood Glucose Monitoring Suppl (BLOOD GLUCOSE MONITOR SYSTEM) w/Device KIT Use as directed to check blood sugar once daily. 1 kit 0   buPROPion  (WELLBUTRIN  XL) 300 MG 24 hr tablet Take 1 tablet (300 mg total) by mouth daily. 90 tablet 0   celecoxib  (CELEBREX ) 200 MG capsule Take 1 capsule (200 mg total) by mouth every 12 (twelve) hours. 28 capsule 0   citalopram  (CELEXA ) 40 MG tablet Take 1 tablet (40 mg total) by mouth daily. 90 tablet 0   ezetimibe  (ZETIA ) 10 MG tablet Take 1 tablet (10 mg total) by mouth daily. 90 tablet 0   glipiZIDE  (GLUCOTROL  XL) 10 MG 24 hr tablet Take 1 tablet (10 mg  total) by mouth daily with breakfast. 90 tablet 0   Glucose Blood (BLOOD GLUCOSE TEST STRIPS) STRP Use to check blood sugar once a day. 100 strip 1   HYDROcodone  bit-homatropine (HYCODAN) 5-1.5 MG/5ML syrup Take 5 mLs by mouth every 6 (six) hours as needed for cough. 90 mL 0   Lancets (FREESTYLE) lancets Use as directed to check blood sugar once daily. 100 each 1   methocarbamol  (ROBAXIN ) 500 MG tablet Take 1 tablet (500 mg total) by mouth every 8 (eight) hours as needed for muscle spasms. 30 tablet 0   pantoprazole  (PROTONIX ) 20 MG tablet Take 1 tablet (20 mg total) by mouth daily. 90 tablet 1   predniSONE  (DELTASONE ) 20 MG tablet Take 2 tablets (40 mg total) by mouth daily. 10 tablet 0   tadalafil  (CIALIS ) 20 MG tablet Take 1 tablet (20 mg  total) by mouth daily as needed for erectile dysfunction. 30 tablet 11   tirzepatide  (MOUNJARO ) 7.5 MG/0.5ML Pen Inject 7.5 mg into the skin once a week. 6 mL 1   traZODone  (DESYREL ) 100 MG tablet Take 2 tablets (200 mg total) by mouth at bedtime. 180 tablet 1   No current facility-administered medications for this visit.    Allergies  Allergen Reactions   Tape Rash    Paper tape-blisters    Family History  Problem Relation Age of Onset   Diabetes Mother    Hyperlipidemia Mother    Bladder Cancer Father    Liver cancer Sister    Diabetes Sister    Diabetes Maternal Aunt    Arthritis Maternal Grandmother    Diabetes Maternal Grandmother    Arthritis Maternal Grandfather    Arthritis Paternal Grandmother    Diabetes Brother    Heart disease Brother    Colon cancer Neg Hx     Social History   Socioeconomic History   Marital status: Married    Spouse name: Nena   Number of children: 2   Years of education: Not on file   Highest education level: Bachelor's degree (e.g., BA, AB, BS)  Occupational History   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  Tobacco Use   Smoking status: Former    Current packs/day: 0.00    Average packs/day: 1 pack/day for 27.0 years (27.0 ttl pk-yrs)    Types: Cigarettes    Start date: 10/18/1982    Quit date: 10/18/2009    Years since quitting: 15.1   Smokeless tobacco: Former    Types: Chew    Quit date: 10/18/1994  Vaping Use   Vaping status: Never Used  Substance and Sexual Activity   Alcohol  use: Yes    Alcohol /week: 21.0 standard drinks of alcohol     Types: 21 Cans of beer per week    Comment: 6 -8 beers daily   Drug use: Never   Sexual activity: Not on file  Other Topics Concern   Not on file  Social History Narrative   Not on file   Social Drivers of Health   Tobacco Use: Medium Risk (11/06/2024)   Patient History    Smoking Tobacco Use: Former    Smokeless Tobacco Use: Former    Passive Exposure: Not on Actuary Strain: Low  Risk (10/08/2024)   Overall Financial Resource Strain (CARDIA)    Difficulty of Paying Living Expenses: Not hard at all  Food Insecurity: No Food Insecurity (10/08/2024)   Epic    Worried About Running Out of Food in the Last Year: Never true  Ran Out of Food in the Last Year: Never true  Transportation Needs: No Transportation Needs (10/08/2024)   Epic    Lack of Transportation (Medical): No    Lack of Transportation (Non-Medical): No  Physical Activity: Insufficiently Active (10/08/2024)   Exercise Vital Sign    Days of Exercise per Week: 3 days    Minutes of Exercise per Session: 10 min  Stress: Stress Concern Present (10/08/2024)   [employer] of Occupational Health - Occupational Stress Questionnaire    Feeling of Stress: To some extent  Social Connections: Moderately Isolated (10/08/2024)   Social Connection and Isolation Panel    Frequency of Communication with Friends and Family: Once a week    Frequency of Social Gatherings with Friends and Family: Never    Attends Religious Services: 1 to 4 times per year    Active Member of [employer] or Organizations: No    Attends Banker Meetings: Not on file    Marital Status: Married  Catering Manager Violence: Not At Risk (04/19/2024)   Epic    Fear of Current or Ex-Partner: No    Emotionally Abused: No    Physically Abused: No    Sexually Abused: No  Depression (PHQ2-9): High Risk (10/08/2024)   Depression (PHQ2-9)    PHQ-2 Score: 13  Alcohol  Screen: High Risk (10/08/2024)   Alcohol  Screen    Last Alcohol  Screening Score (AUDIT): 16  Housing: High Risk (10/08/2024)   Epic    Unable to Pay for Housing in the Last Year: Yes    Number of Times Moved in the Last Year: 0    Homeless in the Last Year: No  Utilities: Not At Risk (04/19/2024)   Epic    Threatened with loss of utilities: No  Health Literacy: Adequate Health Literacy (04/19/2024)   B1300 Health Literacy    Frequency of need for help with medical  instructions: Never     Constitutional: Patient reports intermittent headaches.  Denies fever, malaise, fatigue, or abrupt weight changes.  HEENT: Pt reports runny nose, post nasal drip and nose bleeds. Denies eye pain, eye redness, ear pain, ringing in the ears, wax buildup, runny nose, nasal congestion, or sore throat. Respiratory: Pt reports cough. Denies difficulty breathing, shortness of breath, or sputum production.   Cardiovascular: Denies chest pain, chest tightness, palpitations or swelling in the hands or feet.  Gastrointestinal: Pt reports nausea. Denies abdominal pain, bloating, constipation, diarrhea or blood in the stool.  GU: Patient reports erectile dysfunction.  Denies urgency, frequency, pain with urination, burning sensation, blood in urine, odor or discharge. Musculoskeletal: Patient reports right hip pain.  Denies decrease in range of motion, difficulty with gait, muscle pain or joint swelling.  Skin: Denies redness, rashes, lesions or ulcercations.  Neurological: Patient reports insomnia, paresthesias of upper extremities, and intention.  Denies dizziness, difficulty with memory, difficulty with speech or problems with balance and coordination.  Psych: Patient has a history of anxiety and depression.  Denies SI/HI.  No other specific complaints in a complete review of systems (except as listed in HPI above).  Objective:   Physical Exam BP 118/70 (BP Location: Left Arm, Patient Position: Sitting, Cuff Size: Large)   Pulse 94   Temp 98.1 F (36.7 C)   Ht 5' 11 (1.803 m)   Wt 212 lb 6.4 oz (96.3 kg)   SpO2 96%   BMI 29.62 kg/m    Wt Readings from Last 3 Encounters:  10/08/24 219 lb 3.2 oz (99.4 kg)  09/03/24 218 lb 2 oz (98.9 kg)  08/27/24 222 lb (100.7 kg)    General: Appears his stated age, obese, in NAD. Skin: Warm, dry and intact. No ulcerations noted. HEENT: Head: normal shape and size, no sinus tenderness noted; Eyes: sclera white, no icterus,  conjunctiva pink, PERRLA and EOMs intact; Nose: mucosa dry and boggy, turbinates swollen; Throat: Mucosa pink and moist, no tonsillar enlargement or exudate noted, + PND. Neck: No adenopathy noted. Cardiovascular: Normal rate and rhythm. S1,S2 noted.  No murmur, rubs or gallops noted. No JVD or BLE edema.  Pulmonary/Chest: Normal effort and positive vesicular breath sounds. No respiratory distress. No wheezes, rales or ronchi noted.  Neurological: Alert and oriented.   BMET    Component Value Date/Time   NA 138 10/08/2024 1358   K 3.8 10/08/2024 1358   CL 104 10/08/2024 1358   CO2 27 10/08/2024 1358   GLUCOSE 141 (H) 10/08/2024 1358   BUN 18 10/08/2024 1358   CREATININE 1.02 10/08/2024 1358   CALCIUM  9.8 10/08/2024 1358   GFRNONAA >60 09/27/2023 0949   GFRAA >60 11/20/2019 0445    Lipid Panel     Component Value Date/Time   CHOL 149 10/08/2024 1358   CHOL 157 10/26/2019 1203   TRIG 181 (H) 10/08/2024 1358   HDL 51 10/08/2024 1358   HDL 48 10/26/2019 1203   CHOLHDL 2.9 10/08/2024 1358   VLDL 60.2 (H) 01/01/2021 1047   LDLCALC 72 10/08/2024 1358    CBC    Component Value Date/Time   WBC 3.7 (L) 10/08/2024 1358   RBC 4.92 10/08/2024 1358   HGB 13.1 (L) 10/08/2024 1358   HGB 11.7 (L) 10/03/2023 0812   HCT 40.6 10/08/2024 1358   PLT 232 10/08/2024 1358   PLT 226 10/03/2023 0812   MCV 82.5 10/08/2024 1358   MCH 26.6 (L) 10/08/2024 1358   MCHC 32.3 10/08/2024 1358   RDW 14.6 10/08/2024 1358   LYMPHSABS 0.8 04/19/2024 1154   MONOABS 0.4 04/19/2024 1154   EOSABS 0.1 04/19/2024 1154   BASOSABS 0.0 04/19/2024 1154    Hgb A1C Lab Results  Component Value Date   HGBA1C 5.1 10/08/2024           Assessment & Plan:   Assessment and Plan    Urgent care follow-up for influenza-like illness Urgent care notes reviewed.  Symptoms managed with prednisone , Afrin and cough syrup. No asthma or COPD - Most symptoms have resolved, will monitor  Allergic rhinitis with  postnasal drip Persistent postnasal drip causing nausea and appetite issues. Symptoms worsen in winter. Afrin use caused adverse effects. - Started Xyzal  5 mg once daily for postnasal drip and nasal congestion. - Use nasal saline spray a couple of times a day. - Continue using humidifiers at home.  Encounter for completion of FMLA paperwork Request for FMLA paperwork due to illness-related absence from work. - Completed FMLA paperwork for February 16th to February 23rd, 2026.        RTC in 4 months for your annual exam Angeline Laura, NP  "

## 2024-11-23 ENCOUNTER — Encounter: Payer: Self-pay | Admitting: Internal Medicine

## 2024-11-23 ENCOUNTER — Other Ambulatory Visit: Payer: Self-pay

## 2024-11-23 ENCOUNTER — Ambulatory Visit: Admitting: Internal Medicine

## 2024-11-23 VITALS — BP 118/70 | HR 94 | Temp 98.1°F | Ht 71.0 in | Wt 212.4 lb

## 2024-11-23 DIAGNOSIS — Z0289 Encounter for other administrative examinations: Secondary | ICD-10-CM

## 2024-11-23 DIAGNOSIS — J111 Influenza due to unidentified influenza virus with other respiratory manifestations: Secondary | ICD-10-CM

## 2024-11-23 DIAGNOSIS — J3089 Other allergic rhinitis: Secondary | ICD-10-CM

## 2024-11-23 MED ORDER — LEVOCETIRIZINE DIHYDROCHLORIDE 5 MG PO TABS
5.0000 mg | ORAL_TABLET | Freq: Every evening | ORAL | 1 refills | Status: AC
  Filled 2024-11-23 (×2): qty 90, 90d supply, fill #0

## 2024-12-10 ENCOUNTER — Ambulatory Visit: Admitting: Oncology

## 2024-12-10 ENCOUNTER — Other Ambulatory Visit

## 2024-12-21 ENCOUNTER — Other Ambulatory Visit

## 2024-12-24 ENCOUNTER — Ambulatory Visit: Admitting: Urology

## 2024-12-25 ENCOUNTER — Ambulatory Visit: Admitting: Urology

## 2025-02-18 ENCOUNTER — Inpatient Hospital Stay

## 2025-02-25 ENCOUNTER — Ambulatory Visit: Admitting: Radiation Oncology

## 2025-04-15 ENCOUNTER — Encounter: Admitting: Internal Medicine
